# Patient Record
Sex: Female | Born: 1945 | Race: White | Hispanic: No | Smoking: Never smoker
Health system: Southern US, Community
[De-identification: ages and names within clinical notes are randomized; demographics above are authoritative.]

## PROBLEM LIST (undated history)

## (undated) DIAGNOSIS — I509 Heart failure, unspecified: Secondary | ICD-10-CM

## (undated) DIAGNOSIS — E785 Hyperlipidemia, unspecified: Secondary | ICD-10-CM

## (undated) DIAGNOSIS — R7303 Prediabetes: Secondary | ICD-10-CM

## (undated) DIAGNOSIS — I350 Nonrheumatic aortic (valve) stenosis: Principal | ICD-10-CM

## (undated) DIAGNOSIS — I4510 Unspecified right bundle-branch block: Secondary | ICD-10-CM

## (undated) DIAGNOSIS — I2510 Atherosclerotic heart disease of native coronary artery without angina pectoris: Secondary | ICD-10-CM

## (undated) DIAGNOSIS — K219 Gastro-esophageal reflux disease without esophagitis: Secondary | ICD-10-CM

## (undated) DIAGNOSIS — E669 Obesity, unspecified: Secondary | ICD-10-CM

## (undated) HISTORY — PX: FEMUR FRACTURE SURGERY: SHX633

## (undated) HISTORY — PX: CARDIAC CATHETERIZATION: SHX172

## (undated) HISTORY — PX: CARPAL TUNNEL RELEASE: SHX101

## (undated) HISTORY — PX: REPLACEMENT TOTAL KNEE BILATERAL: SUR1225

## (undated) HISTORY — PX: SHOULDER SURGERY: SHX246

---

## 1999-10-03 ENCOUNTER — Encounter: Admission: RE | Admit: 1999-10-03 | Discharge: 1999-10-03 | Payer: Self-pay | Admitting: Family Medicine

## 1999-10-03 ENCOUNTER — Encounter: Payer: Self-pay | Admitting: Family Medicine

## 1999-12-04 ENCOUNTER — Encounter: Payer: Self-pay | Admitting: Family Medicine

## 1999-12-04 ENCOUNTER — Encounter: Admission: RE | Admit: 1999-12-04 | Discharge: 1999-12-04 | Payer: Self-pay | Admitting: Family Medicine

## 2000-10-05 ENCOUNTER — Encounter: Payer: Self-pay | Admitting: Family Medicine

## 2000-10-05 ENCOUNTER — Encounter: Admission: RE | Admit: 2000-10-05 | Discharge: 2000-10-05 | Payer: Self-pay | Admitting: Family Medicine

## 2001-02-11 ENCOUNTER — Encounter: Payer: Self-pay | Admitting: Orthopedic Surgery

## 2001-02-19 ENCOUNTER — Inpatient Hospital Stay (HOSPITAL_COMMUNITY): Admission: RE | Admit: 2001-02-19 | Discharge: 2001-02-23 | Payer: Self-pay | Admitting: Orthopedic Surgery

## 2001-04-16 ENCOUNTER — Encounter: Admission: RE | Admit: 2001-04-16 | Discharge: 2001-04-16 | Payer: Self-pay | Admitting: Family Medicine

## 2001-04-16 ENCOUNTER — Encounter: Payer: Self-pay | Admitting: Family Medicine

## 2001-05-19 ENCOUNTER — Other Ambulatory Visit: Admission: RE | Admit: 2001-05-19 | Discharge: 2001-05-19 | Payer: Self-pay | Admitting: Obstetrics & Gynecology

## 2001-05-19 ENCOUNTER — Encounter (INDEPENDENT_AMBULATORY_CARE_PROVIDER_SITE_OTHER): Payer: Self-pay | Admitting: Specialist

## 2001-07-29 ENCOUNTER — Inpatient Hospital Stay (HOSPITAL_COMMUNITY): Admission: EM | Admit: 2001-07-29 | Discharge: 2001-07-30 | Payer: Self-pay | Admitting: Orthopedic Surgery

## 2001-10-20 ENCOUNTER — Ambulatory Visit (HOSPITAL_COMMUNITY): Admission: RE | Admit: 2001-10-20 | Discharge: 2001-10-20 | Payer: Self-pay | Admitting: Orthopedic Surgery

## 2002-03-02 ENCOUNTER — Ambulatory Visit (HOSPITAL_BASED_OUTPATIENT_CLINIC_OR_DEPARTMENT_OTHER): Admission: RE | Admit: 2002-03-02 | Discharge: 2002-03-02 | Payer: Self-pay | Admitting: Orthopedic Surgery

## 2003-02-10 ENCOUNTER — Encounter: Payer: Self-pay | Admitting: Specialist

## 2003-02-10 ENCOUNTER — Encounter: Admission: RE | Admit: 2003-02-10 | Discharge: 2003-02-10 | Payer: Self-pay | Admitting: Specialist

## 2005-06-20 ENCOUNTER — Inpatient Hospital Stay (HOSPITAL_COMMUNITY): Admission: RE | Admit: 2005-06-20 | Discharge: 2005-06-24 | Payer: Self-pay | Admitting: Orthopedic Surgery

## 2005-09-05 ENCOUNTER — Other Ambulatory Visit: Admission: RE | Admit: 2005-09-05 | Discharge: 2005-09-05 | Payer: Self-pay | Admitting: Family Medicine

## 2008-07-21 ENCOUNTER — Other Ambulatory Visit: Admission: RE | Admit: 2008-07-21 | Discharge: 2008-07-21 | Payer: Self-pay | Admitting: Family Medicine

## 2009-10-21 ENCOUNTER — Inpatient Hospital Stay (HOSPITAL_COMMUNITY): Admission: EM | Admit: 2009-10-21 | Discharge: 2009-10-24 | Payer: Self-pay | Admitting: Emergency Medicine

## 2011-02-18 LAB — URINALYSIS, ROUTINE W REFLEX MICROSCOPIC
Bilirubin Urine: NEGATIVE
Glucose, UA: NEGATIVE mg/dL
Hgb urine dipstick: NEGATIVE
Ketones, ur: NEGATIVE mg/dL
Nitrite: NEGATIVE
Protein, ur: NEGATIVE mg/dL
Specific Gravity, Urine: 1.016 (ref 1.005–1.030)
Urobilinogen, UA: 0.2 mg/dL (ref 0.0–1.0)
pH: 7.5 (ref 5.0–8.0)

## 2011-02-18 LAB — BASIC METABOLIC PANEL
BUN: 11 mg/dL (ref 6–23)
BUN: 12 mg/dL (ref 6–23)
CO2: 25 mEq/L (ref 19–32)
CO2: 25 mEq/L (ref 19–32)
Calcium: 7.5 mg/dL — ABNORMAL LOW (ref 8.4–10.5)
Calcium: 7.8 mg/dL — ABNORMAL LOW (ref 8.4–10.5)
Calcium: 8.1 mg/dL — ABNORMAL LOW (ref 8.4–10.5)
Chloride: 101 mEq/L (ref 96–112)
Creatinine, Ser: 0.52 mg/dL (ref 0.4–1.2)
Creatinine, Ser: 0.62 mg/dL (ref 0.4–1.2)
GFR calc Af Amer: >60 mL/min (ref >60)
GFR calc Af Amer: >60 mL/min (ref >60)
GFR calc non Af Amer: >60 mL/min (ref >60)
GFR calc non Af Amer: >60 mL/min (ref >60)
Glucose, Bld: 122 mg/dL — ABNORMAL HIGH (ref 70–99)
Glucose, Bld: 141 mg/dL — ABNORMAL HIGH (ref 70–99)
Potassium: 4 mEq/L (ref 3.5–5.1)
Potassium: 4 mEq/L (ref 3.5–5.1)
Sodium: 135 mEq/L (ref 135–145)
Sodium: 136 mEq/L (ref 135–145)

## 2011-02-18 LAB — CBC
HCT: 28.6 % — ABNORMAL LOW (ref 36.0–46.0)
HCT: 30 % — ABNORMAL LOW (ref 36.0–46.0)
HCT: 40.1 % (ref 36.0–46.0)
Hemoglobin: 10.2 g/dL — ABNORMAL LOW (ref 12.0–15.0)
Hemoglobin: 11.3 g/dL — ABNORMAL LOW (ref 12.0–15.0)
Hemoglobin: 13.6 g/dL (ref 12.0–15.0)
Hemoglobin: 9.8 g/dL — ABNORMAL LOW (ref 12.0–15.0)
MCHC: 33.9 g/dL (ref 30.0–36.0)
MCHC: 34.2 g/dL (ref 30.0–36.0)
MCV: 93.6 fL (ref 78.0–100.0)
MCV: 93.6 fL (ref 78.0–100.0)
Platelets: 249 10*3/uL (ref 150–400)
Platelets: 284 10*3/uL (ref 150–400)
RBC: 3.05 MIL/uL — ABNORMAL LOW (ref 3.87–5.11)
RBC: 3.61 MIL/uL — ABNORMAL LOW (ref 3.87–5.11)
RBC: 4.28 MIL/uL (ref 3.87–5.11)
RDW: 14.3 % (ref 11.5–15.5)
RDW: 14.3 % (ref 11.5–15.5)
WBC: 14.3 K/uL — ABNORMAL HIGH (ref 4.0–10.5)
WBC: 9.1 10*3/uL (ref 4.0–10.5)

## 2011-02-18 LAB — DIFFERENTIAL
Basophils Absolute: 0 10*3/uL (ref 0.0–0.1)
Basophils Relative: 0 % (ref 0–1)
Eosinophils Absolute: 0 K/uL (ref 0.0–0.7)
Eosinophils Relative: 0 % (ref 0–5)
Lymphocytes Relative: 11 % — ABNORMAL LOW (ref 12–46)
Lymphs Abs: 1.5 10*3/uL (ref 0.7–4.0)
Monocytes Absolute: 0.8 10*3/uL (ref 0.1–1.0)
Monocytes Relative: 6 % (ref 3–12)
Neutro Abs: 12 10*3/uL — ABNORMAL HIGH (ref 1.7–7.7)
Neutrophils Relative %: 84 % — ABNORMAL HIGH (ref 43–77)

## 2011-02-18 LAB — BASIC METABOLIC PANEL WITH GFR
BUN: 14 mg/dL (ref 6–23)
CO2: 25 meq/L (ref 19–32)
Calcium: 8.7 mg/dL (ref 8.4–10.5)
Chloride: 101 meq/L (ref 96–112)
Creatinine, Ser: 0.44 mg/dL (ref 0.4–1.2)
GFR calc non Af Amer: >60 mL/min (ref >60)
Potassium: 3.9 meq/L (ref 3.5–5.1)

## 2011-02-18 LAB — TYPE AND SCREEN
ABO/RH(D): B POS
Antibody Screen: NEGATIVE

## 2011-02-18 LAB — URINE CULTURE
Colony Count: NO GROWTH
Culture: NO GROWTH

## 2011-02-18 LAB — ABO/RH: ABO/RH(D): B POS

## 2011-02-18 LAB — PROTIME-INR
INR: 0.99 (ref 0.00–1.49)
INR: 1.14 (ref 0.00–1.49)
Prothrombin Time: 13 seconds (ref 11.6–15.2)
Prothrombin Time: 14.5 seconds (ref 11.6–15.2)

## 2011-02-18 LAB — APTT: aPTT: 28 s (ref 24–37)

## 2011-04-04 NOTE — Discharge Summary (Signed)
[facility]  Patient:    Amy [name], Amy [name]                     MRN: [id number] Adm. Date:  [id number] Disc. Date: [id number] Attending:  Marlowe Kays Page Dictator:   Della Goo, P.A. CC:         Dyanne Carrel, M.D.                           Discharge Summary  ADMISSION DIAGNOSES: 1. 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 2. Gastroesophageal reflux disease. 3. Inhalant allergies. 4. Hypercholesterolemia.  DISCHARGE DIAGNOSES: 1. 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 2. Gastroesophageal reflux disease. 3. Inhalant allergies. 4. Hypercholesterolemia. 5. Posthemorrhagic anemia without blood transfusion.  PROCEDURES:  The patient underwent right total knee arthroplasty performed by Dr. Simonne Come, assisted by Dr. Darrelyn Hillock, under spinal anesthesia.  CONSULTATIONS:  None.  HISTORY OF PRESENT ILLNESS:  Amy [name] is a 65 year old white female followed by Dr. Simonne Come for chronic right knee pain.  X-rays have revealed advanced arthritic changes with essentially no medial compartment joint space due to bone-on-bone deformity.  She has also had significant dysfunction secondary to the right knee pain.  Exam shows a severe varus deformity noted as well.  Due to these significant findings it was felt she would require surgical intervention.  After preoperative surgical clearance by Dr. Manus Gunning, the patient was scheduled to undergo the above-stated procedure.  HOSPITAL COURSE:  The patient tolerated the procedure without difficulties, under spinal anesthesia.  Once the spinal anesthesia resolved, neurovascular motor function was noted to be intact.  The patient did not utilize a CPM machine during the hospital stay and, therefore, was followed by physical therapy for active range of motion.  She also participated with physical therapy for ambulation and gait training as well as strengthening exercises following the  total knee replacement protocol and was allowed weightbearing as tolerated on the operative extremity.  She progressed quite well during the hospital stay, ambulating as far as 230 feet with a standard walker.  Range of motion prior to discharge was noted to be -10 to 53 degrees.  Postoperatively the patient was placed on Coumadin for DVT and PE prophylaxis.  Adjustments in her Coumadin dose were made according to daily protimes by the pharmacy at [facility].  The patients Hemovac drain was discontinued on the first postoperative day.  Dressing changes were done daily thereafter, and her wound was found to be healing well.  The patient was placed on morphine PCA postoperatively and developed nausea and vomiting.  This was treated with antiemetics, and resolved after her medications were changed to Dilaudid.  The patient is felt to be sensitive to morphine, with a nausea and vomiting reaction.  Occupational therapy assisted the patient with ADLs, and she tolerated this well.  Her hemoglobin dropped on the first postoperative day to 9.0, followed by a further drop to 8.5, and then stabilizing at 8.6. Unfortunately, she had another hemoglobin value of 8.2 prior to her discharge but did not present with any signs or symptoms for anemia and, therefore, was not transfused.  The patient was felt to be stable for discharge to her home on February 23, 2001.  LABORATORY DATA:  Hemoglobin and hematocrit dropped postoperatively, as stated above.  The patient was noted to be anemic on admission at 9.6, with hematocrit 27.9.  Coagulation studies on admission were within normal  limits and showed signs of elevation while on Coumadin during the hospital stay. Chemistry studies on admission were normal with the exception of albumin 3.3. Urinalysis on admission was negative for urinary tract infection.  Chest x-ray on admission showed negative chest for active disease. Preoperative right knee x-rays  showed marked degenerative joint disease of the right knee.  EKG on admission showed normal sinus rhythm with short PR, otherwise normal EKG, with no old tracings for comparison.  Dr. Patty Sermons confirmed the EKG.  PLAN:  The patient will be discharged to her home.  Arrangements have been made for her to have home health physical therapy and Coumadin management by [facility].  She will follow up with Dr. Simonne Come two weeks from the date of her surgery.  The patient will continue on a regular diet at home. Dressing changes will be done daily, and supplies were given for her to do so.  DISCHARGE MEDICATIONS: 1. Percocet #30, 1-2 every four to six hours as needed for pain, no refill. 2. Robaxin 500 mg #30, 1 every eight hours as needed for spasm, no refill. 3. Trinsicon #60, 1 p.o. b.i.d. 4. Coumadin per pharmacy.  DISCHARGE INSTRUCTIONS:  She will continue with range of motion and strengthening exercises and ambulation weightbearing as tolerated.  She will utilize a walker for ambulation.  The patient has been advised to call the office if there are questions or concerns prior to her return office visit. DD:  02/23/01 TD:  02/23/01 Job: [id number]

## 2011-04-04 NOTE — H&P (Signed)
Amy [name], Amy [name]                ACCOUNT NO.:  [id number]   MEDICAL RECORD NO.:  [id number]           PATIENT TYPE:   LOCATION:                                 FACILITY:   PHYSICIAN:  Marlowe Kays, M.D.  DATE OF BIRTH:  [birth year]   DATE OF ADMISSION:  06/20/2005  DATE OF DISCHARGE:                                HISTORY & PHYSICAL   PREOPERATIVE HISTORY AND PHYSICAL   CHIEF COMPLAINT:  Pain in my left knee.Marland Kitchen   PRESENT ILLNESS:  This is a 65 year old white female seen for continuing  problems concerning her left knee which she injured 3 months ago. She  slipped in the grass, fell and landed on her knee. She has now pain with  ambulation, popping and giving out. X-rays have shown progressive  subluxation of the femur on the tibia, doubling the extent in interval since  2005 when she was previously seen. She also had degenerative changes as  well. She is a very active lady and this is markedly interfering with her  day-to-day activities. She has had several other surgeries including lumbar  laminectomy which she has recovered from very nicely, but now this is  markedly interfering with her day-to-day activities. After much discussion  including the risks and benefits of surgery, it was decided that she would  benefit with left total knee replacement arthroplasty and scheduled for  same.   This patient's family physician is Dr. Blair Heys.   ALLERGIES:  She is allergic to MORPHINE and CODEINE, however, she can use  Dilaudid for PCA pump and she can use Vicodin and Percocet for p.o.  analgesics.   CURRENT MEDICATIONS:  1.  Lovastatin 40 mg daily.  2.  Ranitidine 300 mg b.i.d.  3.  Gabapentin 300 mg 3 times a day.  4.  Fosamax 70 mg weekly.   PAST MEDICAL HISTORY:  1.  She has a hiatal hernia.  2.  Nonsymptomatic diverticulitis.  3.  She also has urine frequency.   PAST SURGERIES:  1.  Benign moles removed in 1973.  2.  Left knee surgery in 2002.  3.  Repair of  the rotator cuff in 2002.  4.  Wrist wrist with carpal tunnel release bilaterally, one in 2002 and one      in 2003.   FAMILY HISTORY:  Positive for heart disease in her mother and brother and  diabetes in her brother.   SOCIAL HISTORY:  The patient is married, she is a housewife. She has 1  child. No intake of alcohol or tobacco products. Her husband will be  caregiver after surgery.   REVIEW OF SYSTEMS:  CNS: No seizure disorder, paralysis, numbness, double  vision. RESPIRATORY: No productive cough, no hemoptysis, no shortness of  breath. CARDIOVASCULAR: No chest pain, no angina or orthopnea.  GASTROINTESTINAL: No nausea, vomiting, melena, or bloody stools.  GENITOURINARY: No discharge, dysuria, hematuria. MUSCULOSKELETAL: Primarily  in the present illness.   PHYSICAL EXAMINATION:  GENERAL:  She is an alert, cooperative, friendly,  somewhat obese 65 year old white female.  VITAL SIGNS:  Blood pressure 124/78, pulse 72,  respirations are 12.  HEENT:  Normocephalic. PERRLA. EOM intact. Oropharynx is clear.  CHEST:  Clear to auscultation, no rhonchi, no rales.  HEART:  Regular rate and rhythm, no murmurs are heard.  ABDOMEN:  Soft, nontender, liver and spleen not felt.  GENITALIA, RECTAL, PELVIC, BREAST:  Not done, not pertinent to present  illness.  EXTREMITIES:  Painful range of motion of the left knee, slight valgus is  seen as well.   ADMITTING DIAGNOSES:  1.  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  2.  Reflux.   PLAN:  The patient will undergo a left total knee replacement arthroplasty.  We plan for her to have home health after her regular hospitalization.   NOTE:  This history and physical was performed in our office on June 18, 2005.      [name]   DLU/MEDQ  D:  06/19/2005  T:  06/19/2005  Job:  [id number]   cc:   Bryan Lemma. Manus Gunning, M.D.  [address] [place]  [place] [address]  Fax: [phone]

## 2011-04-04 NOTE — Op Note (Signed)
[facility]  Patient:    EDIT, RICCIARDELLI Visit Number: [id number] MRN: [id number]          Service Type: DSU Location: [facility] Attending Physician:  Marlowe Kays Page Dictated by:   Illene Labrador. Aplington, M.D. Proc. Date: 03/02/02 Admit Date:  03/02/2002                             Operative Report  PREOPERATIVE DIAGNOSIS:  Right carpal tunnel syndrome, status post left carpal tunnel release.  POSTOPERATIVE DIAGNOSIS:  Right carpal tunnel syndrome, status post left carpal tunnel release.  PROCEDURE:  Decompression of the median nerve, right wrist and hand.  SURGEON:  Illene Labrador. Aplington, M.D.  ASSISTANT:  Nurse.  ANESTHESIA:  IV regional.  PATHOLOGY AND JUSTIFICATION FOR PROCEDURE:  She has been diagnosed with nerve conduction studies of bilateral carpal tunnel syndrome with excellent relief on the left and because of progressive symptomatology on the right, she is here today for release on this side as well.  See operative description below for additional details of pathology.  DESCRIPTION OF PROCEDURE:  Satisfactory IV regional anesthesia.  Duraprep from midforearm to fingertips, was draped in a sterile field.  With a marking pen, I marked out a curved incision along the base of the thenar eminence crossing obliquely over the flexor crease of the wrist in the distal forearm.  The palmaris longus tendon beneath the median nerve were identified at the wrist. The nerve was protected, and the overlying skin and subcutaneous tissue and fascia were released into the distal palm.  Potential bleeders were coagulated with bipolar cautery.  The vascular arcade was gently dissected out in the mid- and distal palm with small hemostat.  The major areas of compression appeared to be in the midpalm.  Once the decompression had been completed, the wound was irrigated with sterile saline and the skin and subcutaneous tissue only closed with interrupted  4-0 nylon mattress sutures.  Betadine, Adaptic, dry sterile dressing with a volar plaster splint were applied.  The tourniquet was released.  At the time of dictation she was on her way to the recovery room in satisfactory condition with no known complications. Dictated by:   Illene Labrador. Aplington, M.D. Attending Physician:  Joaquin Courts DD:  03/02/02 TD:  03/03/02 Job: [id number]

## 2011-04-04 NOTE — Discharge Summary (Signed)
Amy [name], Amy [name]                ACCOUNT NO.:  [id number]   MEDICAL RECORD NO.:  [id number]          PATIENT TYPE:  INP   LOCATION:  1607                         FACILITY:  [facility]   PHYSICIAN:  Amy [name], M.D.  DATE OF BIRTH:  1946/04/30   DATE OF ADMISSION:  06/20/2005  DATE OF DISCHARGE:  06/24/2005                                 DISCHARGE SUMMARY   ADMITTING DIAGNOSES:  1.  Severe osteoarthritis left knee with ligamentous instability..  2.  Reflux.   DISCHARGE DIAGNOSES:  1.  Severe osteoarthritis left knee with ligamentous instability..  2.  Reflux.  3.  Mild postoperative anemia.   OPERATION:  Osteonics total knee replacement arthroplasty of the left knee  with all three component cemented. Amy [name], M.D. assisted.   BRIEF HISTORY:  This 65 year old white female with progressive problems  concerning pain into her left knee. She had an injury about 3 months ago  which aggravated her condition of osteoarthritis. She has popping and  giving out. She has subluxation of the femur on the tibia seen on x-ray.  She is an active lady and this is markedly interfering with her day-to-day  activities, and after much discussion including the risks and benefits of  surgery the patient was scheduled for the above procedure.   HOSPITAL COURSE:  The patient tolerated the surgical procedure quite well,  was placed in the total knee protocol postoperatively which she did quite  well as she was eager to resume her ADLs that she enjoyed before present  illness. She was placed on Coumadin protocol postoperatively for the  prevention of DVT. Wound remained dry without evidence of any infection. She  was doing quite well and was anxious to go home. Home arrangements were made  for therapy. Neurovascular remained intact to the operative extremity and  there were no medical problems while she was in the hospital.   LABORATORY VALUES IN THE HOSPITAL:  Preoperatively showed a mild  anemia with  RBC of 3.8, hemoglobin was 11.8, hematocrit was 34.9. Final hemoglobin was  9.2 with hematocrit of 26.1. Blood chemistries were normal. Urinalysis was  essentially negative for urinary tract infection. Electrocardiogram showed  normal ECG. Chest x-ray showed no evidence of acute abnormality.   CONDITION UPON DISCHARGE:  Improved, stable.   PLAN:  The patient is discharged to her home in the care of her family.  Return to see Amy [name] 2 weeks after the date of surgery. Use dry  dressing p.r.n.  Discharge medications:  Vicodin for discomfort, Phenergan for nausea  Coumadin for DVT prophylaxis, and she is to use over-the-counter iron.  Continue with home medications and diet. Follow up with Dr. Manus [name] per his  [name].      Amy [name]   DLU/MEDQ  D:  06/30/2005  T:  06/30/2005  Job:  [id number]   cc:   Amy [name], M.D.  [address] [place]  [place] [address]  Fax: [phone]

## 2011-04-04 NOTE — Op Note (Signed)
[facility]  Patient:    Amy [name], Amy [name] Visit Number: [id number] MRN: [id number]          Service Type: Attending:  Illene Labrador. Aplington, M.D. Proc. Date: 07/28/01                             Operative Report  PREOPERATIVE DIAGNOSIS:  Torn rotator cuff right shoulder.  POSTOPERATIVE DIAGNOSIS:  Torn rotator cuff right shoulder.  OPERATION:  Anterior acromionectomy, repair of torn rotator cuff right shoulder.  SURGEON:  Illene Labrador. Aplington, M.D.  ASSISTANT:  Dorie Rank, P.A.-C.  ANESTHESIA:  General.  PATHOLOGY AND JUSTIFICATION FOR PROCEDURE:  She has had a painful right shoulder for perhaps six months.  I performed a total knee replacement which was giving her more problems than the shoulder, but since this has come along well, she has noted progressive pain in the right shoulder which led to an MRI performed on August 28, which showed a large rotator cuff tear.  This is confirmed at surgery with a large vertical tear, extending roughly 5 cm up beneath the anterior acromion which was quite hypertrophic with a good bit of separation of two halves of the rotator cuff.  DESCRIPTION OF PROCEDURE:  Prophylactic antibiotics, satisfactory general anesthesia, beach chair position on the Schlein frame, and the right shoulder girdle was prepped with DuraPrep and draped in a sterile field.  Vertical incision from roughly the [facility] joint down to the greater tuberosity.  The fascia over the anterior acromion was opened along with the skin incision and retracted anteriorly and posteriorly.  The large acromion was identified as well as the [facility] joint with the [place] needle.  I made my initial anterior acromionectomy with a microsaw, protecting the underneath rotator cuff with the Cobb elevator.  She had a significant impingement problem and also because of the extent of the tear, ended up having to take off a good bit more of the anterior acromion, beveling it  inferiorly.  When I had adequate exposure to view the tear, it was somewhat layered with two halves to it which were not very movable.  I started at the apex of the tear and began placing interrupted #1 Ethibond sutures, working laterally.  At the greater tuberosity, because of a lack of mobility of the two pieces, I elected to go ahead and use two Mitek anchors with the two threads on each Mitek anchor going up to the individual halves anteriorly and posteriorly and cinching them down both laterally and towards the center portion of the greater tuberosity.  I then tied the two pieces together over top of the greater tuberosity which also helped to stabilize the cuff laterally and bring it together better.  I then placed some additional side-by-side sutures to supplement this.  This seemed to give a nice stable repair.  The wound was then irrigated with sterile saline.  The soft tissue was infiltrated with 0.5% plain Marcaine.  The deltoid muscle was reapproximated in two layers with interrupted #1 Vicryl.  Because of the extent of the ulnar removed from the anterior acromion and also the fact that she did not have a very healthy fascia posteriorly, I reattached the deltoid using two horizontal mattress sutures with #1 Vicryl through a total of four drill holes which have a nice stable reattachment.  I then supplemented this with some #1 Vicryl through the soft tissue.  The subcutaneous tissue was closed with a combination  of #1 Vicryl deep, 2-0 Vicryl superficially with Steri-Strips on the skin.  Betadine and Adaptic dry sterile dressing and shoulder immobilizer were applied.  She tolerated the procedure well and was taken to the recovery room in satisfactory condition with no known complications. Attending:  Illene Labrador. Aplington, M.D. DD:  07/28/01 TD:  07/28/01 Job: [id number]

## 2011-04-04 NOTE — Op Note (Signed)
Amy [name], Amy [name]                ACCOUNT NO.:  [id number]   MEDICAL RECORD NO.:  [id number]          PATIENT TYPE:  INP   LOCATION:  1607                         FACILITY:  [facility]   PHYSICIAN:  Marlowe Kays, M.D.  DATE OF BIRTH:  [birth year]   DATE OF PROCEDURE:  06/20/2005  DATE OF DISCHARGE:                                 OPERATIVE REPORT   PREOPERATIVE DIAGNOSIS:  Osteoarthritis, left knee.   POSTOPERATIVE DIAGNOSIS:  Osteoarthritis, left knee.   OPERATION:  Osteonics total knee replacement, left.   SURGEON:  Marlowe Kays, M.D.   ASSISTANT:  Georges Lynch. Darrelyn Hillock, M.D.   ANESTHESIA:  General.   JUSTIFICATION FOR PROCEDURE:  She has had successful total knee replacement  on the right.  In the left knee, she had severe tricompartmental arthritis  with medial subluxation of the femur on the tibia.   DESCRIPTION OF PROCEDURE:  Satisfactory general anesthesia. Prophylactic  antibiotics, Foley catheter inserted.  I used a bone bur underneath the left  hip, lateral hip stabilizer and Sure Foot and pneumatic tourniquet.  The  left leg was prepped from tourniquet to ankle with DuraPrep and draped in a  sterile field.  Ioban employed.  The left leg was esmarked out sterilely  after draping it in a sterile field.  Vertical midline incision down to the  patellar mechanism with median parapatellar incision to open the joint.  Patellar mechanism was freed up.  The patella everted and the knee flexed.  Osteophytes were removed from around the femur and the patella and the  anterior portions of menisci and a portion of the ACL and PCL were resected.  I made a 5/16 inch drill hole in the distal femur followed by the canal  finder and the axis aligner set for 5 degrees valgus cut to the left knee.  A 10 mm cut was made on the distal femur.  Her knee was quite tight and for  this reason I went on to the tibial preparation, first making a leveling cut  on the proximal tibia and incising  the proximal tibia tentatively with a #5.  Initially, intramedullary drill hole was made followed by the step-cut  drill, canal finder and the internal rod with the external guide for cutting  a 4 mm medial base wedge off the proximal tibia.  This 90 degree cut was  made and I then returned to the femur where I was then able get the sizing  guide in place.  She had very narrow medial lateral femur but high anterior  and posterior.  Using the sizing jig, we sized her at a 7 but we just felt  that this was a misreading for her anatomy. She had a five in the right leg.  I sized her femur with a 5 and found that this did appear to be the  appropriate size.  Consequently, I placed the size 5 cutting jig on the  distal femur and made my initial cuts based on it, which seemed to be  appropriate.  We took off neither too much or too little off the anterior  femur.  After these cuts were made, I then placed the jig for making the  patellar groove and cutting for the intercondylar area for the tibial post.  She used a microsaw initially to help remove bone before making these final  cuts.  I then used the laminar spreader to remove bone from the posterior  femoral condyles as well as remnants of the menisci and the PCL.  I then  went through a trial reduction with a 5 femur and as it turned out a size 3  tibia was more appropriate.  A 10-mm spacer easily fit so we did not have to  do more bone resection.  I then lined up the tibial tray with the external  rods, splitting bimalleolar distance and made we scribe lines on the tibia  at that point.  I then went to the patella.  The patella was too small on  the right to use even the 26 mm patella but we had barely enough room in the  anterior-posterior direction but there was plenty of room medially and  laterally.  I elected to go ahead and use the 26 mm patella, and I used the  of 10 mm recessed cutting jig, first followed by the guide for making  three  fixation holes.  I then placed a trial 26 patella and trimmed up excess bone  from around the perimeter.  I then returned to the tibia, and I placed a #3  tray on the tibia based on the previous scribe marks which were stabilized  with three pins.  I then used the tripod apparatus to ream up to a 5  cemented.  I then water picked the knee while the methylmethacrylate was  being mixed.  After drying the tibia, we inserted the tibial component,  following placement glue, impacting it and then removing excess  methylmethacrylate.  I likewise glued in the femoral component and held the  knee in extension with a 10 mm spacer while we glued in the patellar  component using a patellar holder.  When the methylmethacrylate had  hardened, I removed particles of methylmethacrylate from around the  components, particularly checking posteriorly on the femur.  I then went  through another series of trial reductions and found that a 15 posterior  stabilized was a little tight and a 12 seemed to be ideal which is what we  ultimately used. After irrigating the tray well, we placed a final 12 mm  posterior stabilized insert reducing the knee and found it to be nicely  stable.  No lateral release was required.  The Hemovac was placed and the  wound was closed in layers with interrupted #1 Vicryl in the synovium and  capsule distally and two layers in the quadriceps proximally.  Subcutaneous  tissue was closed with a combination of 1-0 and 2-0 Vicryl and staples in  the skin.  Betadine, Adaptic and dry sterile dressing were applied.  Tourniquet was released.  She tolerated the procedure well and was taken to  recovery in satisfactory condition without known complications.       JA/MEDQ  D:  06/20/2005  T:  06/21/2005  Job:  [id number]

## 2011-04-04 NOTE — H&P (Signed)
[facility]  Patient:    Amy [name], Amy [name]                         MRN: [id number] Adm. Date:  02/19/01 Attending:  Fayrene Fearing P. Aplington, M.D. Dictator:   [name]. Shela Nevin, P.A. CC:         Dyanne Carrel, M.D., [facility] [place], [address] [place]             [address], [place], [place]  [address]   History and Physical  DATE OF BIRTH:  1946-05-27.  CHIEF COMPLAINT:  "Pain in my right knee."  PRESENT ILLNESS:  This 65 year old white female is seen by Dr. Illene Labrador. Aplington for continuing problems concerning her right knee.  She had trauma to this knee with multiple falls over the years and has been noticing increasing problems with ambulation.  She is a very stoic lady and she is very active and has continued as much activity as possible despite this knee.  X-rays have shown advanced arthritis changes with essentially no medial compartment joint space due to bone-on-bone deformity.  There is also noted to be an osteo-density in the femoral condyle.  She has severe varus noted as well.  Due to these positive findings and the fact that this patient is finding her general lifestyle deteriorating, it is felt she would benefit from surgical intervention with a total knee replacement arthroplasty to the right knee.  She plans to go home with therapy after her regular hospitalization. She has been cleared preoperatively by Dr. Dyanne Carrel.  The patient has donated two units of autologous blood for this procedure.  PAST MEDICAL HISTORY:  The patient has had only minor surgeries with a mole removed from her breast area (benign) and toe surgery.  She has mild hypercholesterolemia, GERD, seasonal allergies and she is allergic to mold.  CURRENT MEDICATIONS: 1. Prempro 0.625/2.5 mg q.d. 2. Ranitidine 300 mg one b.i.d. p.r.n. 3. Guaifenesin 600 mg b.i.d. p.r.n. 4. Aleve and Tylenol, one daily as needed (will stop five days  prior to    surgery). 5. She also takes vitamin K as a supplement and she will stop this today, on    February 11, 2001. 6. I told her not to take her other herbal supplements. 7. She may take vitamin E and C before the surgery.  ALLERGIES:  The patient is allergic to EPINEPHRINE, which causes irregular heart beat, and some NSAIDS.  DARVOCET-N causes nausea.  She is also sensitive to ANESTHESIA secondary to nausea.  SOCIAL HISTORY:  The patient is married and neither smokes nor drinks.  FAMILY HISTORY:  Positive for hypercholesterolemia, arthritis and osteoporosis.  REVIEW OF SYSTEMS:  CNS:  No seizure disorder, paralysis, numbness or double vision.  She occasionally has blurred vision but wears glasses. GASTROINTESTINAL:  No nausea, vomiting, melena or bloody stools.  RESPIRATORY: No productive cough.  No hemoptysis.  No shortness of breath.  CARDIOVASCULAR: No chest pain.  No angina.  No orthopnea.  GENITOURINARY:  No discharge, dysuria or hematuria.  MUSCULOSKELETAL:  Primarily in present illness.  PHYSICAL EXAMINATION:  GENERAL:  Alert, cooperative, friendly 65 year old white female whose vital signs are:  Blood pressure 160/82; pulse 80; respirations are 12.  HEENT:  Normocephalic.  PERRLA.  Wears glasses.  Oropharynx is clear.  CHEST:  Clear to auscultation.  No rhonchi nor rales.  HEART:  Regular rate and rhythm.  No murmurs  are heard.  ABDOMEN:  Soft and nontender.  Liver and spleen not felt.  GENITALIA/PELVIC:  Not done, not pertinent to present illness.  RECTAL:  Not done, not pertinent to present illness.  BREASTS:  Not done, not pertinent to present illness.  EXTREMITIES:  The patient has pain and crepitus and ______ and pain with range of motion.  The knee is full and warm but no effusion.  Neurovascular is intact to the right lower extremity.  ADMITTING DIAGNOSES: 1. 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 2. Gastroesophageal reflux disease. 3.  Inhalant allergies.  PLAN:  Patient will be admitted for total knee replacement arthroplasty of the right knee.  She will go home after her regular surgical procedure.  She has donated two units of autologous blood for this procedure and it is of note, she has some mild anemia prior to admission.  She has been cleared by her physician, Dr. Blair Heys for this surgery.  We will certainly ask Dr. Manus Gunning to follow along with [place] during this patients hospitalization, should any need medically arise. DD:  02/11/01 TD:  02/12/01 Job: [id number]

## 2011-04-04 NOTE — H&P (Signed)
[facility]  Patient:    Amy [name], Amy [name] Visit Number: [id number] MRN: [id number]          Service Type: Attending:  Illene Labrador. Aplington, M.D. Dictated by:   Sammuel Cooper. Mahar, P.A. Adm. Date:  07/28/01                           History and Physical  DATE OF BIRTH:  07/30/1946  PATIENT IDENTIFICATION:  She is a 65 year old female.  CHIEF COMPLAINT:  Right shoulder pain.  HISTORY OF PRESENT ILLNESS:  Patient has had a significant right shoulder pain since April of this year, however, she had a total knee replacement done at that time so is kind of a secondary concern for her.  She does not really remember any specific injury to onset, it was just accumulating over time and progressively getting worse.  She is a right-hand-dominant female.  It has currently progressed to the point that she is having significant pain and giving-way of her right shoulder three to four times a week; she is also having significant pain at night causing her to have difficulty sleeping.  She does have some weakness secondary to the pain.  She has failed conservative management at this point and is to the point that it is interfering with her activities of daily living and quality of life.  ALLERGIES:  Patient reports EPINEPHRINE causes an irregular heart beat, DARVOCET causes nausea and vomiting, ANESTHESIA causes nausea and vomiting and MORPHINE causes nausea and vomiting.  MEDICATIONS: 1. Evista 60 mg one q.d.  Patient is going to stop that on Sunday. 2. Ranitidine 300 mg one b.i.d. as needed. 3. Guaifenesin 600 mg one b.i.d. as needed. 4. Tylenol p.r.n. 5. Vitamin K; patient is going to stop prior to surgery. 6. Vitamin E; the patient is to stop prior to surgery. 7. Vitamin C. 8. Aspirin 81 mg one b.i.d., which she is stopping as of today prior to    surgery.  PAST MEDICAL HISTORY:  Significant for hypercholesterolemia, gastroesophageal reflux disease and seasonal  allergies.  PAST SURGICAL HISTORY:  Significant for a total knee on the right side done in April of 2002, a mole removed from her breast which was benign, toe surgery.  SOCIAL HISTORY:  Patient denies any tobacco use.  Denies alcohol use.  She is married and lives locally with her husband.  FAMILY HISTORY:  Mother is deceased at age 71 secondary to an MI.  Father is alive at age 49 and is healthy with only osteoporosis.  She does have a brother that died at age 51 secondary to an MI with a history of diabetes.  REVIEW OF SYSTEMS:  Patient denies any fevers, chills, night sweats, bleeding tendencies; denies blurred vision, double vision, headaches, paralysis or seizures; denies any chest pain, orthopnea, claudication or angina; denies any shortness of breath, productive cough or hemoptysis; denies nausea, vomiting, constipation, diarrhea, melena or blood in her stool and denies any hematuria, dysuria.  MUSCULOSKELETAL:  Denies any paresthesias, numbness.  PHYSICAL EXAMINATION:  VITAL SIGNS:  Blood pressure is 138/86.  Respirations 16 and unlabored.  Pulse is 84 and regular.  GENERAL APPEARANCE:  Patient is a 65 year old white female who is alert and oriented, in no acute distress.  She is pleasant and cooperative to exam.  She is well-nourished, well-groomed and appears her stated age.  HEENT:  Head is normocephalic, atraumatic.  Extraocular movements intact. Pupils are equal, round  and reactive to light.  Nares are patent bilaterally. Throat is clear without any erythema or exudate.  NECK:  Soft and supple to palpation.  No thyromegaly noted.  No bruits appreciated.  No lymphadenopathy.  CHEST:  Clear to auscultation bilaterally.  No rales, rhonchi, wheezes, stridor or friction rub.  BREASTS:  Not pertinent, not performed.  HEART:  Regular S1, S3, regular rate and rhythm.  No murmurs, gallops or rubs appreciated.  ABDOMEN:  Soft and supple to palpation, positive bowel  sounds throughout, nontender, nondistended.  No organomegaly noted.  GU:  Not pertinent and not performed.  EXTREMITIES:  Sensation is intact to bilateral upper extremities.  Range of motion is painful at the extremes to her right shoulder.  Bilateral upper extremities are neurovascularly intact.  SKIN:  Intact without any rashes or lesions.  X-RAY FINDINGS:  MRI showed a right rotator cuff tear.  IMPRESSION: 1. Right rotator cuff tear. 2. Hypercholesterolemia. 3. Gastroesophageal reflux disease. 4. Seasonal allergies.  PLAN:  Plan is to admit to [facility] on July 28, 2001 for a right rotator cuff repair by Dr. Illene Labrador. Aplington.  Patients primary care physician is Dr. Dyanne Carrel of [facility], who has referred her to our clinic for evaluation of her shoulder. Dictated by:   Sammuel Cooper. Mahar, P.A. Attending:  Illene Labrador. Aplington, M.D. DD:  07/22/01 TD:  07/22/01 Job: [id number]

## 2011-04-04 NOTE — Discharge Summary (Signed)
[facility]  Patient:    Amy [name], Amy [name] Visit Number: [id number] MRN: [id number]          Service Type: SUR Location: 4W [id number] 02 Attending Physician:  Marlowe Kays Page Dictated by:   Marcie Bal Velna Ochs, P.A.-C Admit Date:  07/28/2001 Disc. Date: 07/30/01                             Discharge Summary  DISCHARGE DIAGNOSES: 1. Torn rotator cuff, right shoulder. 2. Postoperative nausea and vomiting. 3. Hypercholesterolemia. 4. Gastroesophageal reflux disease. 5. Seasonal allergies.  PROCEDURE:  Anterior acromionectomy and repair of torn rotator cuff right shoulder by Dr. Simonne Come with the assistance of Dorie Rank, P.A.-C on July 28, 2001.  Please see operative summary for further details.  CONSULTATIONS:  None.  LABORATORY DATA AND X-RAY FINDINGS:  Preoperative urinalysis on September 6, was all within normal limits.  Negative nitrite and leukocyte esterase. Comprehensive metabolic panel was all within normal limits with the exception of slightly elevated glucose of 116.  Her sodium and potassium were 138 and 3.9 respectively.  BUN and creatinine 12 and 0.6.  PT and PTT also within normal limits preop.  White count 5.8, H&H 12.5 and 35.6 with a platelet count of 327.  Chest x-ray prior to admission from February 11, 2001, showed the heart, lungs and bony thorax to be within normal limits.  EKG also from February 11, 2001, showed normal sinus rhythm with a short PR interval, otherwise normal EKG.  CHIEF COMPLAINT:  Right shoulder pain.  HISTORY OF PRESENT ILLNESS:  Amy [name] is a 65 year old, right-handed dominant female who presents with complaints of right shoulder pain.  This began in April 2002.  She does not recall any specific injury or cause for her symptoms.  Pain has progressively worsened to the point that her shoulder feels like it has been giving way on her three to four times a week.  She has also had pain at night and  difficulty sleeping.  She has no significant weakness in that shoulder.  She has tried conservative measures including antiinflammatory medications and strengthening exercises without improvement. Because of the effects on her quality of life and interference of her activities of daily living, she elected to undergo surgical intervention. Prior to surgery, preoperative labs were obtained and signed surgical consent. The patient was admitted on September 11, for surgical intervention.  HOSPITAL COURSE:  Following surgical procedure, the patient was taken to the PACU in stable condition.  She was transferred to the orthopedic floor in good condition.  She did fairly well during her hospital stay, but did have some early postoperative nausea.  She worked to control her nausea postop.  On postop day #1, she did have significant nausea as well as vomiting.  This is felt to be probably a combination of her pain medications and anesthesia.  She was treated with antiemetics and still continued with some nausea through the afternoon of postop day #1.  Her pain was well-controlled.  Because of this, she was kept overnight for nausea and vomiting control.  Also noted, he was consulted to assist the patient with ADLs.  On postop day #2, she was feeling much improved and her nausea and vomiting had resolved.  Pain was controlled and she was stable and ready for discharge home.  Of note, she had a very low-grade fever of 100 degrees.  This was treated with Tylenol.  DISPOSITION:  Discharged to home.  DIET:  Low cholesterol diet.  FOLLOWUP:  Follow up with Dr. Simonne Come two weeks out from surgery.  She is to call [phone], for an appointment.  SPECIAL INSTRUCTIONS:  She is to change dressings once daily.  Keep the area clean and dry.  She is encouraged to move her fingers and wrists.  She may move her elbow flexion and extension, but no shoulder movements with sling. No use or lifting of that  arm.  DISCHARGE MEDICATIONS:  1. Percocet 5/325 one or two every four to six hours as needed for pain.  2. Phenergan 25 mg one p.o. q.6h. p.r.n. nausea.  3. Robaxin 500 mg one p.o. q.8h. p.r.n. spasm.  4. Evista 60 mg p.o. q.d.  5. Ranitidine 300 mg p.o. b.i.d.  6. Guaifenesin 600 mg p.o. b.i.d.  7. Tylenol as needed for temperature over 100 degrees.  8. Vitamin K.  9. Vitamin E. 10. Vitamin C. 11. Aspirin 81 mg p.o. b.i.d.  CONDITION ON DISCHARGE:  Good and improved. Dictated by:   Marcie Bal Velna Ochs, P.A.-C Attending Physician:  Joaquin Courts DD:  07/30/01 TD:  07/30/01 Job: [id number]

## 2011-04-04 NOTE — Op Note (Signed)
[place]  Patient:    Amy [name], Amy [name]                     MRN: [id number] Proc. Date: 02/19/01 Adm. Date:  [id number] Attending:  Marlowe Kays Page                           Operative Report  PREOPERATIVE DIAGNOSIS:   Severe osteoarthritis, right knee.  POSTOPERATIVE DIAGNOSIS:  Severe osteoarthritis, right knee.  OPERATION:  Osteonics total knee replacement right with Scorpio cruciate sacrificing model.  SURGEON:  Illene Labrador. Aplington, M.D.  ASSISTANT:  Georges Lynch. Darrelyn Hillock, M.D.  ANESTHESIA:  General.  INDICATIONS:  She had a severe varus deformity with a 15 degree flexion contracture.  This was associated with advanced tricompartmental arthritis.  DESCRIPTION OF PROCEDURE:   Prophylactic antibiotics, appropriate spinal anesthesia, Foley catheter inserted.  Pneumatic tourniquet.  Sure-Foot.  Right leg was prepped with DuraPrep from tourniquet to ankle and draped in a sterile field.  Ioban employed.  Vertical midline incision down to the patella mechanism in knee with parapatellar incision to open the joint.  Osteophytes were removed from around the patella and the femur.  The patients right and mid collateral ligaments were undermined.  Because of the significant tightness of her knee, I elected to go with a posterior cruciate sacrificing model, Scorpio, and the cruciates were removed and the anterior portions of both menisci removed.  With some difficulty with preserving the patellar tendon, I was able to evert the patella and flex the knee.  I made my initial intermedullary drill hole in the distal femur followed by the axis line for a 5 degree cut over the right knee, and I took a 12 mm distal femoral cut because of the severe contracture.  Because her knee was so tight I then went ahead and bypassed the usual continuing procedure on the femur because there was not room to work and I made a leveling cut on the proximal tibia and  then proceeded to make my proximal tibial cut in the usual fashion.  We started with a #5 tray and made my intermedullary drill hole followed by a canal finder and then the intermedullary rod two which I attached the external cutting jig for a 5 degree posterior cut of 4 mm and we lined up the cutting guide using an external line and splitting the bimalleolar distance.  A 5 degree posterior cut was made.  Residual osteophytes were removed from the tibia medially and we then sized the knee at a size 3 for the tibia.  We now had enough working room that I was able to continue cutting the femur.  I placed the cradle apparatus and we sized the distal femoral compartment at a 5.  The scribe lines were placed for placement of the distal femoral cut jig which was placed and anterior and posterior cuts and anterior and posterior chamferings were then made.  We then placed the distal femoral guide for creating both a patellar groove and also cut out for the post for the tray. After these cuts were made, I then used the lens spreader to remove residual from bone and soft tissue of posterior joints bilaterally, and we then went through a trial reduction with femoral component tightly impacted which fit nicely on the femur and a #3 tibial tray.  A 12 mm spacer seemed to be about the appropriate width at  this point.  With the knee in extension, we then looked at the patella.  After I trimmed up osteophytes, I measured the total width of the patella.  It had a maximum of 18 mm which we felt was too small for the usual 10 mm recess cutting prosthesis.  Also, the 26 prosthesis was too large in terms of superior and inferior placement.  Therefore, we went ahead and did a resurfacing type of patella.  I leveled the patella down until it was about 12 mm in thickness.  We did not feel comfortable going any less than this.  A #3 patella was the best fit and the trial guide for making three drill holes was then  placed.  None of the drill holes were too deep.  I went ahead and returned to the tibia where the tripod apparatus for creating the tibial keyhole was placed.  Just prior to that, we attached the trial #3 tibial template which we previously lined up splitting bimalleolar distance once again for proper positioning on the tibia which I stabilized with three pins.  We then reamed up to a 3 cemented.  The knee was then water picked while the methyl methacrylate was being mixed.  We then individually glued in the components, starting first with the tibial component which was tightly impacted with excess methyl methacrylate being removed.  We then moved the femur.  Once again we tightly impacted it and made sure that no glue was in the hole for the post.  The knee was held in extension with a trial 12 mm spacer while we glued in the patella which we held with the patellar holding clamp.  Once the methacrylate hardened, we removed small bits of methacrylate from around the components and went through another trial reduction and found that the 15 mm thickness spacer was the best.  An 18 was too tight. Consequently, we went ahead and after irrigating the wound well with antibiotic solution placed the final PCS #15 mm #3 spacer in place and reduced the knee.  Extensive lateral release was required, and we performed all the lateral release that we could.  Univac was then placed and the wound closed with two layers in the closure medially and the quadriceps with #1 Vicryl and distally with two layers, with one in the synovium and one in the capsule. Deep subcutaneous tissue was closed with #1 Vicryl.  Distally, I was able to close the subcutaneous tissue with 2-0 Vicryl but proximal to the patella she had a large thickness of fatty tissue and there was no substance to hold sutures so I had to use some 2-0 nylon stay sutures which I supplemented with 3-0 nylon and staples and staples in the rest of the  wound as well as some 3-0 nylon distally for additional wound stabilization.  Betadine, Adaptic, dry sterile dressing were applied.  Tourniquet was released after two hours of tourniquet time.  She tolerated the procedure well and was taken to the  recovery room in satisfactory condition with no complications. Estimated blood loss was approximately 100 cc.  No blood replacement.  DD:  02/19/01 TD:  02/21/01 Job: [id number]

## 2011-04-04 NOTE — Op Note (Signed)
[place]  Patient:    Amy [name], Amy [name] Visit Number: [id number] MRN: [id number]          Service Type: DSU Location: [facility] [id number] Attending Physician:  Marlowe Kays Page Dictated by:   Illene Labrador. Aplington, M.D. Proc. Date: 10/20/01 Admit Date:  10/20/2001                             Operative Report  PREOPERATIVE DIAGNOSIS:  Left carpal tunnel syndrome.  POSTOPERATIVE DIAGNOSIS:  Left carpal tunnel syndrome.  OPERATION:  Decompression median nerve left wrist and hand.  SURGEON:  Illene Labrador. Aplington, M.D.  ASSISTANT:  Nurse.  ANESTHESIA:  General.  JUSTIFICATION FOR PROCEDURE:  She had pain and numbness characteristic of carpal tunnel syndrome with nerve conduction studies confirming the diagnosis. At surgery, she had significant compression of the median nerve and discoloration of the median nerve in the carpal canal.  PROCEDURE:  Satisfactory general anesthesia, pneumatic tourniquet, arm was esmarched out, Duraprep from mid forearm to fingertips, which was draped in a sterile field.  With a marking pen, I marked out the surgical incision curved along the base of the thenar eminence, crossing obliquely over the flexor crease of the wrist in the distal forearm.  The palmaris longus tendon was identified and retracted radial-ward.  The median nerve were identified deep to it and then began progressive release of tissues, skin and subcutaneous tissue and fascia.  Potential bleeders were coagulated with bipolar cautery. Dissection was carried out in the distal palm with the mid palm vascular arcade and nerve branches gently dissected out with hemostat.  Once the decompression had been completed, I irrigated the wound with sterile saline and released the tourniquet.  Several minor bleeders were coagulated, but the wound was basically dry on closure.  The skin and subcutaneous tissue were closed with interrupted 4-0 nylon mattress  sutures.  Betadine, Adaptic, dry sterile dressing and volar plaster splint were applied.  She tolerated the procedure well and was taken to the recovery room in satisfactory condition with no known complications. Dictated by:   Illene Labrador. Aplington, M.D. Attending Physician:  Joaquin Courts DD:  10/20/01 TD:  10/20/01 Job: [id number]

## 2014-01-29 ENCOUNTER — Encounter (HOSPITAL_COMMUNITY): Payer: Self-pay | Admitting: Emergency Medicine

## 2014-01-29 ENCOUNTER — Emergency Department (HOSPITAL_COMMUNITY)
Admission: EM | Admit: 2014-01-29 | Discharge: 2014-01-30 | Disposition: A | Discharge status: Home / Self Care | Payer: Medicare HMO | Attending: Emergency Medicine | Admitting: Emergency Medicine

## 2014-01-29 DIAGNOSIS — S0181XA Laceration without foreign body of other part of head, initial encounter: Secondary | ICD-10-CM

## 2014-01-29 DIAGNOSIS — S0180XA Unspecified open wound of other part of head, initial encounter: Secondary | ICD-10-CM | POA: Insufficient documentation

## 2014-01-29 DIAGNOSIS — W19XXXA Unspecified fall, initial encounter: Secondary | ICD-10-CM

## 2014-01-29 DIAGNOSIS — Z79899 Other long term (current) drug therapy: Secondary | ICD-10-CM | POA: Insufficient documentation

## 2014-01-29 DIAGNOSIS — Z8739 Personal history of other diseases of the musculoskeletal system and connective tissue: Secondary | ICD-10-CM | POA: Insufficient documentation

## 2014-01-29 DIAGNOSIS — S0230XA Fracture of orbital floor, unspecified side, initial encounter for closed fracture: Secondary | ICD-10-CM | POA: Insufficient documentation

## 2014-01-29 DIAGNOSIS — Y92009 Unspecified place in unspecified non-institutional (private) residence as the place of occurrence of the external cause: Secondary | ICD-10-CM | POA: Insufficient documentation

## 2014-01-29 DIAGNOSIS — Y939 Activity, unspecified: Secondary | ICD-10-CM | POA: Insufficient documentation

## 2014-01-29 DIAGNOSIS — W010XXA Fall on same level from slipping, tripping and stumbling without subsequent striking against object, initial encounter: Secondary | ICD-10-CM | POA: Insufficient documentation

## 2014-01-29 DIAGNOSIS — H1130 Conjunctival hemorrhage, unspecified eye: Secondary | ICD-10-CM | POA: Insufficient documentation

## 2014-01-29 DIAGNOSIS — H1131 Conjunctival hemorrhage, right eye: Secondary | ICD-10-CM

## 2014-01-29 DIAGNOSIS — E785 Hyperlipidemia, unspecified: Secondary | ICD-10-CM | POA: Insufficient documentation

## 2014-01-29 NOTE — ED Notes (Addendum)
Pt reports she tripped and fell at 2330, hitting the R side of her head above her R eyebrow on the corner of a coffee table. Dried blood present, indentation to head at area of laceration noted. Pt denies being on anticoagulants or LOC or nausea. Pt a&o x4.

## 2014-01-30 ENCOUNTER — Emergency Department (HOSPITAL_COMMUNITY): Payer: Medicare HMO

## 2014-01-30 MED ORDER — TRAMADOL HCL 50 MG PO TABS
50.0000 mg | ORAL_TABLET | Freq: Once | ORAL | Status: AC
  Administered 2014-01-30: 50 mg via ORAL
  Filled 2014-01-30: qty 1

## 2014-01-30 MED ORDER — METHOCARBAMOL 500 MG PO TABS
750.0000 mg | ORAL_TABLET | Freq: Once | ORAL | Status: AC
  Administered 2014-01-30: 750 mg via ORAL
  Filled 2014-01-30: qty 2

## 2014-01-30 MED ORDER — TRAMADOL HCL 50 MG PO TABS
50.0000 mg | ORAL_TABLET | Freq: Four times a day (QID) | ORAL | Status: DC | PRN
Start: 2014-01-30 — End: 2021-10-16

## 2014-01-30 MED ORDER — METHOCARBAMOL 750 MG PO TABS: 750.0000 mg | ORAL_TABLET | Freq: Four times a day (QID) | ORAL | Status: DC

## 2014-01-30 NOTE — Discharge Instructions (Signed)
Suture may be removed in 5 days.  This can be done at urgent care, with your primary care doctor, or in the ER.  You have a fracture to the floor of your orbital cavity.  This should heal well on its own.  Follow up with [facility] for recheck of this fracture in 3-5 days.  Expect to have swelling and bruising to your right eye.   Facial Laceration  A facial laceration is a cut on the face. These injuries can be painful and cause bleeding. Lacerations usually heal quickly, but they need special care to reduce scarring. DIAGNOSIS  Your health care provider will take a medical history, ask for details about how the injury occurred, and examine the wound to determine how deep the cut is. TREATMENT  Some facial lacerations may not require closure. Others may not be able to be closed because of an increased risk of infection. The risk of infection and the chance for successful closure will depend on various factors, including the amount of time since the injury occurred. The wound may be cleaned to help prevent infection. If closure is appropriate, pain medicines may be given if needed. Your health care provider will use stitches (sutures), wound glue (adhesive), or skin adhesive strips to repair the laceration. These tools bring the skin edges together to allow for faster healing and a better cosmetic outcome. If needed, you may also be given a tetanus shot. HOME CARE INSTRUCTIONS  Only take over-the-counter or prescription medicines as directed by your health care provider.  Follow your health care provider's instructions for wound care. These instructions will vary depending on the technique used for closing the wound. For Sutures:  Keep the wound clean and dry.   If you were given a bandage (dressing), you should change it at least once a day. Also change the dressing if it becomes wet or dirty, or as directed by your health care provider.   Wash the wound with soap and water 2 times a day.  Rinse the wound off with water to remove all soap. Pat the wound dry with a clean towel.   After cleaning, apply a thin layer of the antibiotic ointment recommended by your health care provider. This will help prevent infection and keep the dressing from sticking.   You may shower as usual after the first 24 hours. Do not soak the wound in water until the sutures are removed.   Get your sutures removed as directed by your health care provider. With facial lacerations, sutures should usually be taken out after 4 5 days to avoid stitch marks.   Wait a few days after your sutures are removed before applying any makeup. For Skin Adhesive Strips:  Keep the wound clean and dry.   Do not get the skin adhesive strips wet. You may bathe carefully, using caution to keep the wound dry.   If the wound gets wet, pat it dry with a clean towel.   Skin adhesive strips will fall off on their own. You may trim the strips as the wound heals. Do not remove skin adhesive strips that are still stuck to the wound. They will fall off in time.  For Wound Adhesive:  You may briefly wet your wound in the shower or bath. Do not soak or scrub the wound. Do not swim. Avoid periods of heavy sweating until the skin adhesive has fallen off on its own. After showering or bathing, gently pat the wound dry with a clean  towel.   Do not apply liquid medicine, cream medicine, ointment medicine, or makeup to your wound while the skin adhesive is in place. This may loosen the film before your wound is healed.   If a dressing is placed over the wound, be careful not to apply tape directly over the skin adhesive. This may cause the adhesive to be pulled off before the wound is healed.   Avoid prolonged exposure to sunlight or tanning lamps while the skin adhesive is in place.  The skin adhesive will usually remain in place for 5 10 days, then naturally fall off the skin. Do not pick at the adhesive film.  After  Healing: Once the wound has healed, cover the wound with sunscreen during the day for 1 full year. This can help minimize scarring. Exposure to ultraviolet light in the first year will darken the scar. It can take 1 2 years for the scar to lose its redness and to heal completely.  SEEK IMMEDIATE MEDICAL CARE IF:  You have redness, pain, or swelling around the wound.   You see ayellowish-white fluid (pus) coming from the wound.   You have chills or a fever.  MAKE SURE YOU:  Understand these instructions.  Will watch your condition.  Will get help right away if you are not doing well or get worse. Document Released: 12/11/2004 Document Revised: 08/24/2013 Document Reviewed: 06/16/2013 [facility] Patient Information 2014 [place], [place].  Orbital Floor Fracture, Non-Blowout The eye sits in the part of the skull called the "orbit." The upper and outside walls of the orbit are thick and strong. The inside wall (near the nose) and the orbital floor are very thin and weak. The tissues around the eye will briefly press together if there is a direct blow to the front of the eye. This leads to high pressure against the orbital walls. The inside wall and the orbital floor may break since these are the weakest walls. If the orbital floor breaks, the tissues around the eye, including the muscle that makes the eye look down, may become trapped in the sinus below when the orbital floor "blows out." If a blowout does not happen, the orbital floor fracture is considered a non-blowout orbital fracture. CAUSES An orbital floor fracture can be caused by any accident in which an object hits the face or the face strikes against a hard object. The most common ways that people break their eye socket include:  Being hit by a blunt object, such as a baseball bat or a fist.  Striking the face on the car dashboard during a crash.  Falls.  Gunshot. SYMPTOMS  If there has been no injury to the eye itself,  symptoms may include:  Puffiness (swelling) and bruising around the eye area (black eye).  Numbness of the cheek and upper gum on the side with the floor fracture. This is caused by nerve injury to these areas.  Pain around the eye.  Headache.  Ear pain on the injured side. DIAGNOSIS The diagnosis of an orbital floor fracture is suspected during an eye exam by an ophthalmologist. It is confirmed by X rays or CT scan. TREATMENT Your caregiver may suggest waiting 1 or 2 weeks for the swelling to go away before examining the eye. When the swelling lessens, your caregiver will examine the eye to see if there is any sign of a trapped muscle or double vision when looking in different directions. If double vision is not found and muscle or tissue did  not get trapped, no further treatment is necessary. After that, in almost all cases, the bones heal together on their own.  HOME CARE INSTRUCTIONS  Take all pain medicine as directed by your caregiver.  Use ice packs or other cold therapy to reduce swelling as directed by your caregiver.  Do not put a contact lens in the injured eye until your caregiver approves.  Avoid dusty environments.  Always wear protective glasses or goggles when recommended. Wearing protective eyewear is not dangerous to your injured eye and will not delay healing.  As long as your other eye is seeing normally, you may return to work and drive.  You may travel by plane or be in high altitudes. However, your swelling may take longer to go away, and you may have sinus pain.  Be aware that your depth perception and your ability to judge distance may be reduced or lost. SEEK IMMEDIATE MEDICAL CARE IF:  Your vision changes.  Your redness or swelling persists around the injured eye or gets worse.  You start to have double vision.  You have a bloody or discolored discharge from your nose.  You have a fever that lasts longer than 2 to 3 days.  You have a fever that  suddenly gets worse.  Your cheek or upper gum numbness does not go away. MAKE SURE YOU:  Understand these instructions.  Will watch your condition.  Will get help right away if you are not doing well or get worse. Document Released: 01/26/2012 Document Reviewed: 01/26/2012 [facility] Patient Information 2014 [place].

## 2014-01-30 NOTE — ED Provider Notes (Signed)
Medical screening examination/treatment/procedure(s) were conducted as a shared visit with non-physician practitioner(s) and myself.  I personally evaluated the patient during the encounter.   EKG Interpretation None       Kalman Drape, MD 01/30/14 0430

## 2014-01-30 NOTE — ED Provider Notes (Signed)
CSN: [id number]     Arrival date & time 01/29/14  2340 History   First MD Initiated Contact with Patient 01/29/14 2351     Chief Complaint  Patient presents with  . Fall  . Head Laceration     (Consider location/radiation/quality/duration/timing/severity/associated sxs/prior Treatment) HPI 68 year old female presents to emergency department after a fall.  Patient reports she tripped on a rug landed on the corner of a coffee table.  She sustained a laceration to the right eyebrow.  She denies LOC.  Patient reports her tetanus is up to date.  Patient denies any pain.  No visual changes, no blurred vision.  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 History reviewed. No pertinent past medical history. Past Surgical History  Procedure Laterality Date  . Replacement total knee bilateral    . Femur fracture surgery    . Carpal tunnel release Bilateral   . Shoulder surgery Right     rotator cuff   History reviewed. No pertinent family history. History  Substance Use Topics  . Smoking status: Never Smoker   . Smokeless tobacco: Never Used  . Alcohol Use: No   OB History   Grav Para Term Preterm Abortions TAB SAB Ect Mult Living                 Review of Systems   See History of Present Illness; otherwise all other systems are reviewed and negative  Allergies  Morphine and related  Home Medications   Current Outpatient Rx  Name  Route  Sig  Dispense  Refill  . acetaminophen (TYLENOL) 500 MG tablet   Oral   Take 500 mg by mouth every 6 (six) hours as needed for mild pain or headache.         . alendronate (FOSAMAX) 70 MG tablet   Oral   Take 1 tablet by mouth once a week. On Sundays         . gabapentin (NEURONTIN) 300 MG capsule   Oral   Take 1 capsule by mouth 3 (three) times daily.         Marland Kitchen lovastatin (MEVACOR) 40 MG tablet   Oral   Take 1 tablet by mouth daily.         . ranitidine (ZANTAC) 300 MG tablet   Oral   Take  1 tablet by mouth 2 (two) times daily as needed for heartburn.           BP 159/79  Pulse 88  Temp(Src) 97.9 F (36.6 C) (Oral)  Resp 20  SpO2 95% Physical Exam  Nursing note and vitals reviewed. Constitutional: She is oriented to person, place, and time. She appears well-developed and well-nourished.  HENT:  Head: Normocephalic.  Right Ear: External ear normal.  Left Ear: External ear normal.  Nose: Nose normal.  Mouth/Throat: Oropharynx is clear and moist.  4 cm laceration to lateral aspect of left eyebrow.  Bleeding is controlled.  Eyes: Conjunctivae and EOM are normal. Pupils are equal, round, and reactive to light.  Patient has subconjunctival hemorrhage to right lateral eye..  Vision is normal.  Extraocular motion is intact  Neck: Normal range of motion. Neck supple. No JVD present. No tracheal deviation present. No thyromegaly present.  Cardiovascular: Normal rate, regular rhythm, normal heart sounds and intact distal pulses.  Exam reveals no gallop and no friction rub.   No murmur heard. Pulmonary/Chest: Effort normal and breath sounds normal. No stridor. No respiratory distress. She has  no wheezes. She has no rales. She exhibits no tenderness.  Abdominal: Soft. Bowel sounds are normal. She exhibits no distension and no mass. There is no tenderness. There is no rebound and no guarding.  Musculoskeletal: Normal range of motion. She exhibits no edema and no tenderness.  Lymphadenopathy:    She has no cervical adenopathy.  Neurological: She is alert and oriented to person, place, and time. She has normal reflexes. No cranial nerve deficit. She exhibits normal muscle tone. Coordination normal.  Skin: Skin is warm and dry. No rash noted. No erythema. No pallor.  Psychiatric: She has a normal mood and affect. Her behavior is normal. Judgment and thought content normal.    ED Course  Procedures (including critical care time) Labs Review Labs Reviewed - No data to  display Imaging Review Ct Head Wo Contrast  01/30/2014   CLINICAL DATA:  Fall. Hit right side of head with laceration to right forehead.  EXAM: CT HEAD WITHOUT CONTRAST  CT MAXILLOFACIAL WITHOUT CONTRAST  CT CERVICAL SPINE WITHOUT CONTRAST  TECHNIQUE: Multidetector CT imaging of the head, cervical spine, and maxillofacial structures were performed using the standard protocol without intravenous contrast. Multiplanar CT image reconstructions of the cervical spine and maxillofacial structures were also generated.  COMPARISON:  None.  FINDINGS: CT HEAD FINDINGS  There is no acute intracranial hemorrhage or infarct. No mass lesion or midline shift. Gray-white matter differentiation is well maintained. Ventricles are normal in size without evidence of hydrocephalus. CSF containing spaces are within normal limits. No extra-axial fluid collection.  The calvarium is intact.  No mastoid effusion.  Contusion with laceration present at the right forehead/right periorbital region. Right orbital for fracture better evaluated on concomitant CT of the face.  CT MAXILLOFACIAL FINDINGS  There is an acute right orbital for fracture or with mild displacement. Layering blood present within the left maxillary sinus. The right intraorbital contents including the extraocular muscles remain normally located within the bony right orbit. The right globe is intact. Right periorbital soft tissue contusion with laceration is present. Remainder of the bony right orbit is intact.  Zygomatic arches are intact. Mandible is intact. Mandibular condyles are normally located within the temporomandibular fossa the bilaterally. Dental caries noted.  Maxilla is intact.  Nasal bones are intact.  CT CERVICAL SPINE FINDINGS  The vertebral bodies are normally aligned with preservation of the normal cervical lordosis. Vertebral body heights are preserved. Normal C1-2 articulations are intact. No prevertebral soft tissue swelling. No acute fracture or  listhesis.  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  Visualized soft tissues of the neck are within normal limits. Scattered atherosclerotic calcifications noted within the arterial vasculature of the neck. Visualized lung apices are clear without evidence of apical pneumothorax.  IMPRESSION: CT HEAD:  No acute intracranial process.  CT MAXILLOFACIAL:  1. Right orbital floor fracture with mild displacement. The right intraorbital contents remain normally located within the bony right orbit. Intact right globe with no retro-orbital pathology. 2. Right periorbital soft tissue contusion with laceration.  CT CERVICAL SPINE:  No acute traumatic injury within the cervical spine.   Electronically Signed   By: Jeannine Boga M.D.   On: 01/30/2014 02:07   Ct Cervical Spine Wo Contrast  01/30/2014   CLINICAL DATA:  Fall. Hit right side of head with laceration to right forehead.  EXAM: CT HEAD WITHOUT CONTRAST  CT MAXILLOFACIAL WITHOUT CONTRAST  CT CERVICAL SPINE WITHOUT CONTRAST  TECHNIQUE: Multidetector CT imaging of the head, cervical spine, and maxillofacial structures were performed using the standard protocol without intravenous contrast. Multiplanar CT image reconstructions of the cervical spine and maxillofacial structures were also generated.  COMPARISON:  None.  FINDINGS: CT HEAD FINDINGS  There is no acute intracranial hemorrhage or infarct. No mass lesion or midline shift. Gray-white matter differentiation is well maintained. Ventricles are normal in size without evidence of hydrocephalus. CSF containing spaces are within normal limits. No extra-axial fluid collection.  The calvarium is intact.  No mastoid effusion.  Contusion with laceration present at the right forehead/right periorbital region. Right orbital for fracture better evaluated on concomitant CT of the face.  CT MAXILLOFACIAL FINDINGS  There is an acute right orbital for fracture  or with mild displacement. Layering blood present within the left maxillary sinus. The right intraorbital contents including the extraocular muscles remain normally located within the bony right orbit. The right globe is intact. Right periorbital soft tissue contusion with laceration is present. Remainder of the bony right orbit is intact.  Zygomatic arches are intact. Mandible is intact. Mandibular condyles are normally located within the temporomandibular fossa the bilaterally. Dental caries noted.  Maxilla is intact.  Nasal bones are intact.  CT CERVICAL SPINE FINDINGS  The vertebral bodies are normally aligned with preservation of the normal cervical lordosis. Vertebral body heights are preserved. Normal C1-2 articulations are intact. No prevertebral soft tissue swelling. No acute fracture or listhesis.  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  Visualized soft tissues of the neck are within normal limits. Scattered atherosclerotic calcifications noted within the arterial vasculature of the neck. Visualized lung apices are clear without evidence of apical pneumothorax.  IMPRESSION: CT HEAD:  No acute intracranial process.  CT MAXILLOFACIAL:  1. Right orbital floor fracture with mild displacement. The right intraorbital contents remain normally located within the bony right orbit. Intact right globe with no retro-orbital pathology. 2. Right periorbital soft tissue contusion with laceration.  CT CERVICAL SPINE:  No acute traumatic injury within the cervical spine.   Electronically Signed   By: Jeannine Boga M.D.   On: 01/30/2014 02:07   Ct Maxillofacial Wo Cm  01/30/2014   CLINICAL DATA:  Fall. Hit right side of head with laceration to right forehead.  EXAM: CT HEAD WITHOUT CONTRAST  CT MAXILLOFACIAL WITHOUT CONTRAST  CT CERVICAL SPINE WITHOUT CONTRAST  TECHNIQUE: Multidetector CT imaging of the head, cervical spine, and maxillofacial  structures were performed using the standard protocol without intravenous contrast. Multiplanar CT image reconstructions of the cervical spine and maxillofacial structures were also generated.  COMPARISON:  None.  FINDINGS: CT HEAD FINDINGS  There is no acute intracranial hemorrhage or infarct. No mass lesion or midline shift. Gray-white matter differentiation is well maintained. Ventricles are normal in size without evidence of hydrocephalus. CSF containing spaces are within normal limits. No extra-axial fluid collection.  The calvarium is intact.  No mastoid effusion.  Contusion with laceration present at the right forehead/right periorbital region. Right orbital for fracture better evaluated on concomitant CT of the face.  CT MAXILLOFACIAL FINDINGS  There is an acute right orbital for fracture or with mild displacement. Layering blood present within the left maxillary sinus. The right intraorbital contents including the extraocular muscles remain normally located within the bony right orbit. The right globe is intact. Right periorbital soft tissue contusion with laceration is present. Remainder of the bony right orbit is  intact.  Zygomatic arches are intact. Mandible is intact. Mandibular condyles are normally located within the temporomandibular fossa the bilaterally. Dental caries noted.  Maxilla is intact.  Nasal bones are intact.  CT CERVICAL SPINE FINDINGS  The vertebral bodies are normally aligned with preservation of the normal cervical lordosis. Vertebral body heights are preserved. Normal C1-2 articulations are intact. No prevertebral soft tissue swelling. No acute fracture or listhesis.  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  Visualized soft tissues of the neck are within normal limits. Scattered atherosclerotic calcifications noted within the arterial vasculature of the neck. Visualized lung apices are clear without evidence of apical  pneumothorax.  IMPRESSION: CT HEAD:  No acute intracranial process.  CT MAXILLOFACIAL:  1. Right orbital floor fracture with mild displacement. The right intraorbital contents remain normally located within the bony right orbit. Intact right globe with no retro-orbital pathology. 2. Right periorbital soft tissue contusion with laceration.  CT CERVICAL SPINE:  No acute traumatic injury within the cervical spine.   Electronically Signed   By: Jeannine Boga M.D.   On: 01/30/2014 02:07     EKG Interpretation None      MDM   Final diagnoses:  Fall at home  Facial laceration  Fracture of orbital floor  Subconjunctival hemorrhage of right eye    68 year old female status post fall.  Laceration repaired by Montine Circle PA.  CT scan was ordered.  Given history of osteoporosis and bone spurs in the cervical spine.    Kalman Drape, MD 01/30/14 [phone]

## 2014-01-30 NOTE — ED Provider Notes (Signed)
I was asked by Dr. Sharol Given to repair the patient's right eyebrow laceration.  12:46 AM LACERATION REPAIR Performed by: Montine Circle Authorized by: Montine Circle Consent: Verbal consent obtained. Risks and benefits: risks, benefits and alternatives were discussed Consent given by: patient Patient identity confirmed: provided demographic data Prepped and Draped in normal sterile fashion Wound explored  Laceration Location: Right eyebrow laceration  Laceration Length: 5 cm  No Foreign Bodies seen or palpated  Anesthesia: local infiltration  Local anesthetic: lidocaine 2% with epinephrine  Anesthetic total: 4 ml  Irrigation method: syringe Amount of cleaning: standard  Skin closure: 5-0 prolene  Number of sutures: 10  Technique: interrupted  Patient tolerance: Patient tolerated the procedure well with no immediate complications.  Laceration repaired without complication. Patient given instructions regarding wound healing, and scar minimization. Care to be continued by Dr. Reece Leader, PA-C 01/30/14 [phone]

## 2014-12-28 DIAGNOSIS — H25811 Combined forms of age-related cataract, right eye: Secondary | ICD-10-CM | POA: Diagnosis not present

## 2014-12-28 DIAGNOSIS — H2511 Age-related nuclear cataract, right eye: Secondary | ICD-10-CM | POA: Diagnosis not present

## 2015-01-11 DIAGNOSIS — R7309 Other abnormal glucose: Secondary | ICD-10-CM | POA: Diagnosis not present

## 2015-03-16 DIAGNOSIS — H04123 Dry eye syndrome of bilateral lacrimal glands: Secondary | ICD-10-CM | POA: Diagnosis not present

## 2015-03-16 DIAGNOSIS — Z961 Presence of intraocular lens: Secondary | ICD-10-CM | POA: Diagnosis not present

## 2015-05-10 DIAGNOSIS — Z885 Allergy status to narcotic agent status: Secondary | ICD-10-CM | POA: Diagnosis not present

## 2015-05-10 DIAGNOSIS — H02423 Myogenic ptosis of bilateral eyelids: Secondary | ICD-10-CM | POA: Diagnosis not present

## 2015-05-10 DIAGNOSIS — Z961 Presence of intraocular lens: Secondary | ICD-10-CM | POA: Diagnosis not present

## 2015-05-10 DIAGNOSIS — M8580 Other specified disorders of bone density and structure, unspecified site: Secondary | ICD-10-CM | POA: Diagnosis not present

## 2015-05-10 DIAGNOSIS — H5709 Other anomalies of pupillary function: Secondary | ICD-10-CM | POA: Diagnosis not present

## 2015-05-10 DIAGNOSIS — E780 Pure hypercholesterolemia: Secondary | ICD-10-CM | POA: Diagnosis not present

## 2015-06-08 DIAGNOSIS — R739 Hyperglycemia, unspecified: Secondary | ICD-10-CM | POA: Diagnosis not present

## 2015-06-08 DIAGNOSIS — M542 Cervicalgia: Secondary | ICD-10-CM | POA: Diagnosis not present

## 2015-06-08 DIAGNOSIS — K219 Gastro-esophageal reflux disease without esophagitis: Secondary | ICD-10-CM | POA: Diagnosis not present

## 2015-06-08 DIAGNOSIS — E780 Pure hypercholesterolemia: Secondary | ICD-10-CM | POA: Diagnosis not present

## 2015-06-08 DIAGNOSIS — H8113 Benign paroxysmal vertigo, bilateral: Secondary | ICD-10-CM | POA: Diagnosis not present

## 2015-06-08 DIAGNOSIS — M859 Disorder of bone density and structure, unspecified: Secondary | ICD-10-CM | POA: Diagnosis not present

## 2015-06-19 DIAGNOSIS — H02423 Myogenic ptosis of bilateral eyelids: Secondary | ICD-10-CM | POA: Diagnosis not present

## 2015-06-19 DIAGNOSIS — H02403 Unspecified ptosis of bilateral eyelids: Secondary | ICD-10-CM | POA: Diagnosis not present

## 2015-08-29 DIAGNOSIS — Z1231 Encounter for screening mammogram for malignant neoplasm of breast: Secondary | ICD-10-CM | POA: Diagnosis not present

## 2015-09-06 DIAGNOSIS — H02403 Unspecified ptosis of bilateral eyelids: Secondary | ICD-10-CM | POA: Diagnosis not present

## 2015-09-13 DIAGNOSIS — H02423 Myogenic ptosis of bilateral eyelids: Secondary | ICD-10-CM | POA: Diagnosis not present

## 2015-11-28 DIAGNOSIS — E785 Hyperlipidemia, unspecified: Secondary | ICD-10-CM | POA: Diagnosis not present

## 2015-11-28 DIAGNOSIS — K219 Gastro-esophageal reflux disease without esophagitis: Secondary | ICD-10-CM | POA: Diagnosis not present

## 2015-11-28 DIAGNOSIS — G5603 Carpal tunnel syndrome, bilateral upper limbs: Secondary | ICD-10-CM | POA: Diagnosis not present

## 2015-11-28 DIAGNOSIS — H02423 Myogenic ptosis of bilateral eyelids: Secondary | ICD-10-CM | POA: Diagnosis not present

## 2015-11-28 DIAGNOSIS — Z7982 Long term (current) use of aspirin: Secondary | ICD-10-CM | POA: Diagnosis not present

## 2015-11-28 DIAGNOSIS — H02403 Unspecified ptosis of bilateral eyelids: Secondary | ICD-10-CM | POA: Diagnosis not present

## 2015-11-28 DIAGNOSIS — E7800 Pure hypercholesterolemia, unspecified: Secondary | ICD-10-CM | POA: Diagnosis not present

## 2016-03-18 DIAGNOSIS — H04123 Dry eye syndrome of bilateral lacrimal glands: Secondary | ICD-10-CM | POA: Diagnosis not present

## 2016-03-18 DIAGNOSIS — Z0100 Encounter for examination of eyes and vision without abnormal findings: Secondary | ICD-10-CM | POA: Diagnosis not present

## 2016-03-18 DIAGNOSIS — Z961 Presence of intraocular lens: Secondary | ICD-10-CM | POA: Diagnosis not present

## 2016-06-06 DIAGNOSIS — H8110 Benign paroxysmal vertigo, unspecified ear: Secondary | ICD-10-CM | POA: Diagnosis not present

## 2016-06-06 DIAGNOSIS — E7800 Pure hypercholesterolemia, unspecified: Secondary | ICD-10-CM | POA: Diagnosis not present

## 2016-06-06 DIAGNOSIS — M545 Low back pain: Secondary | ICD-10-CM | POA: Diagnosis not present

## 2016-06-06 DIAGNOSIS — Z1211 Encounter for screening for malignant neoplasm of colon: Secondary | ICD-10-CM | POA: Diagnosis not present

## 2016-06-06 DIAGNOSIS — Z1389 Encounter for screening for other disorder: Secondary | ICD-10-CM | POA: Diagnosis not present

## 2016-06-06 DIAGNOSIS — G8929 Other chronic pain: Secondary | ICD-10-CM | POA: Diagnosis not present

## 2016-06-06 DIAGNOSIS — K219 Gastro-esophageal reflux disease without esophagitis: Secondary | ICD-10-CM | POA: Diagnosis not present

## 2016-06-06 DIAGNOSIS — R7301 Impaired fasting glucose: Secondary | ICD-10-CM | POA: Diagnosis not present

## 2016-06-06 DIAGNOSIS — M8580 Other specified disorders of bone density and structure, unspecified site: Secondary | ICD-10-CM | POA: Diagnosis not present

## 2016-08-29 DIAGNOSIS — Z780 Asymptomatic menopausal state: Secondary | ICD-10-CM | POA: Diagnosis not present

## 2016-08-29 DIAGNOSIS — Z1231 Encounter for screening mammogram for malignant neoplasm of breast: Secondary | ICD-10-CM | POA: Diagnosis not present

## 2016-09-15 DIAGNOSIS — H04123 Dry eye syndrome of bilateral lacrimal glands: Secondary | ICD-10-CM | POA: Diagnosis not present

## 2016-10-01 DIAGNOSIS — K5730 Diverticulosis of large intestine without perforation or abscess without bleeding: Secondary | ICD-10-CM | POA: Diagnosis not present

## 2016-10-01 DIAGNOSIS — Z1211 Encounter for screening for malignant neoplasm of colon: Secondary | ICD-10-CM | POA: Diagnosis not present

## 2016-12-09 DIAGNOSIS — M542 Cervicalgia: Secondary | ICD-10-CM | POA: Diagnosis not present

## 2016-12-09 DIAGNOSIS — H8113 Benign paroxysmal vertigo, bilateral: Secondary | ICD-10-CM | POA: Diagnosis not present

## 2016-12-09 DIAGNOSIS — M8580 Other specified disorders of bone density and structure, unspecified site: Secondary | ICD-10-CM | POA: Diagnosis not present

## 2016-12-09 DIAGNOSIS — R7309 Other abnormal glucose: Secondary | ICD-10-CM | POA: Diagnosis not present

## 2016-12-09 DIAGNOSIS — E7800 Pure hypercholesterolemia, unspecified: Secondary | ICD-10-CM | POA: Diagnosis not present

## 2016-12-09 DIAGNOSIS — K219 Gastro-esophageal reflux disease without esophagitis: Secondary | ICD-10-CM | POA: Diagnosis not present

## 2017-03-17 DIAGNOSIS — H04123 Dry eye syndrome of bilateral lacrimal glands: Secondary | ICD-10-CM | POA: Diagnosis not present

## 2017-04-15 DIAGNOSIS — M8580 Other specified disorders of bone density and structure, unspecified site: Secondary | ICD-10-CM | POA: Diagnosis not present

## 2017-04-15 DIAGNOSIS — K219 Gastro-esophageal reflux disease without esophagitis: Secondary | ICD-10-CM | POA: Diagnosis not present

## 2017-04-15 DIAGNOSIS — M47816 Spondylosis without myelopathy or radiculopathy, lumbar region: Secondary | ICD-10-CM | POA: Diagnosis not present

## 2017-04-15 DIAGNOSIS — E7800 Pure hypercholesterolemia, unspecified: Secondary | ICD-10-CM | POA: Diagnosis not present

## 2017-04-15 DIAGNOSIS — D1722 Benign lipomatous neoplasm of skin and subcutaneous tissue of left arm: Secondary | ICD-10-CM | POA: Diagnosis not present

## 2017-04-15 DIAGNOSIS — Z1159 Encounter for screening for other viral diseases: Secondary | ICD-10-CM | POA: Diagnosis not present

## 2017-04-15 DIAGNOSIS — Z23 Encounter for immunization: Secondary | ICD-10-CM | POA: Diagnosis not present

## 2017-04-15 DIAGNOSIS — Z0000 Encounter for general adult medical examination without abnormal findings: Secondary | ICD-10-CM | POA: Diagnosis not present

## 2017-04-15 DIAGNOSIS — H8110 Benign paroxysmal vertigo, unspecified ear: Secondary | ICD-10-CM | POA: Diagnosis not present

## 2017-04-15 DIAGNOSIS — R7303 Prediabetes: Secondary | ICD-10-CM | POA: Diagnosis not present

## 2017-05-11 ENCOUNTER — Ambulatory Visit: Payer: Self-pay | Admitting: Surgery

## 2017-05-11 DIAGNOSIS — R229 Localized swelling, mass and lump, unspecified: Secondary | ICD-10-CM | POA: Diagnosis not present

## 2017-05-11 NOTE — H&P (Signed)
History of Present Illness Amy [name]. Amy Bousquet MD; 05/11/2017 3:25 PM) The patient is a 71 year old female who presents with a complaint of Mass. Referred by Dr. Shirline Frees for palpable masses left forearm This is a 71 year old female who presents with several years of some slowly enlarging masses in her left forearm. She can palpate at least 3 of these. These seem to be slightly enlarged. They have become more uncomfortable. When she tries to wash dishes these areas rub against the side of the sink causing significant discomfort. They have never become infected. They remain mobile. There is not been any imaging.   Past Surgical History Dalbert Mayotte, [place]; 05/11/2017 10:16 AM) Cataract Surgery Bilateral. Knee Surgery Bilateral. Oral Surgery  Diagnostic Studies History Dalbert Mayotte, [place]; 05/11/2017 10:16 AM) Colonoscopy within last year Mammogram within last year Pap Smear >5 years ago  Allergies Dalbert Mayotte, CMA; 05/11/2017 10:18 AM) Codeine/Codeine Derivatives Morphine Sulfate in Dextrose *ANALGESICS - OPIOID* Allergies Reconciled  Medication History Dalbert Mayotte, CMA; 05/11/2017 10:19 AM) Gabapentin (300MG  Capsule, Oral) Active. Lovastatin (40MG  Tablet, Oral) Active. RaNITidine HCl (300MG  Tablet, Oral) Active. Fosamax (Oral) Specific strength unknown - Active. Methocarbamol (750MG  Tablet, Oral) Active. Multiple Vitamin (1 (one) Oral) Active. Fish Oil Concentrate (435MG  Capsule, 1 (one) Oral) Active. Flax Seed Oil (1000MG  Capsule, 1 (one) Oral) Active. Medications Reconciled  Social History Dalbert Mayotte, [place]; 05/11/2017 10:16 AM) Caffeine use Coffee. No alcohol use No drug use Tobacco use Never smoker.  Family History Dalbert Mayotte, [place]; 05/11/2017 10:16 AM) Alcohol Abuse Brother. Anesthetic complications Father, Mother. Colon Polyps Sister. Diabetes Mellitus Brother. Heart Disease Mother.  Pregnancy / Birth History Dalbert Mayotte, [place]; 05/11/2017 10:16 AM) Age at menarche 34 years. Age of menopause 3-55 Gravida 1 Maternal age 41-20 Para 1  Other Problems Dalbert Mayotte, [place]; 05/11/2017 10:16 AM) Arthritis Back Pain Diverticulosis Gastroesophageal Reflux Disease General anesthesia - complications Hypercholesterolemia     Review of Systems Dalbert Mayotte CMA; 05/11/2017 10:16 AM) General Not Present- Appetite Loss, Chills, Fatigue, Fever, Night Sweats, Weight Gain and Weight Loss. Skin Not Present- Change in Wart/Mole, Dryness, Hives, Jaundice, New Lesions, Non-Healing Wounds, Rash and Ulcer. HEENT Present- Seasonal Allergies, Sinus Pain and Wears glasses/contact lenses. Not Present- Earache, Hearing Loss, Hoarseness, Nose Bleed, Oral Ulcers, Ringing in the Ears, Sore Throat, Visual Disturbances and Yellow Eyes. Respiratory Not Present- Bloody sputum, Chronic Cough, Difficulty Breathing, Snoring and Wheezing. Breast Not Present- Breast Mass, Breast Pain, Nipple Discharge and Skin Changes. Gastrointestinal Not Present- Abdominal Pain, Bloating, Bloody Stool, Change in Bowel Habits, Chronic diarrhea, Constipation, Difficulty Swallowing, Excessive gas, Gets full quickly at meals, Hemorrhoids, Indigestion, Nausea, Rectal Pain and Vomiting. Female Genitourinary Present- Frequency and Nocturia. Not Present- Painful Urination, Pelvic Pain and Urgency. Musculoskeletal Present- Back Pain and Joint Stiffness. Not Present- Joint Pain, Muscle Pain, Muscle Weakness and Swelling of Extremities. Neurological Not Present- Decreased Memory, Fainting, Headaches, Numbness, Seizures, Tingling, Tremor, Trouble walking and Weakness. Psychiatric Not Present- Anxiety, Bipolar, Change in Sleep Pattern, Depression, Fearful and Frequent crying. Endocrine Not Present- Cold Intolerance, Excessive Hunger, Hair Changes, Heat Intolerance, Hot flashes and New Diabetes. Hematology Not Present- Blood Thinners, Easy Bruising,  Excessive bleeding, Gland problems, HIV and Persistent Infections.  Vitals Dalbert Mayotte CMA; 05/11/2017 10:20 AM) 05/11/2017 10:20 AM Weight: 190.2 lb Height: 58in Body Surface Area: 1.78 m Body Mass Index: 39.75 kg/m  Temp.: 97.37F  Pulse: 97 (Regular)  BP: 140/90 (Sitting, Left Arm, Standard)      Physical Exam Rodman Key K. Shateka Petrea  MD; 05/11/2017 3:26 PM)  The physical exam findings are as follows: Note:WDWN in NAD Left forearm - multiple subcutaneous nodules - firm, mobile, no skin changes More proximal is 4 cm and is firm and nodular More distal are two adjacent masses - 2 cm and 3 cm - firm and nodular    Assessment & Plan Rodman Key K. Mitsue Peery MD; 05/11/2017 10:29 AM)  MASS OF SUBCUTANEOUS TISSUE (R22.9) Impression: Left forearm - 4 cm, 3 cm, 2 cm  Current Plans Schedule for Surgery. Excision of multiple subcutaneous masses of the left forearm. The surgical procedure has been discussed with the patient. Potential risks, benefits, alternative treatments, and expected outcomes have been explained. All of the patient's questions at this time have been answered. The likelihood of reaching the patient's treatment goal is good. The patient understand the proposed surgical procedure and wishes to proceed.  Amy [name]. Georgette Dover, MD, [facility] Surgery  General/ Trauma Surgery  05/11/2017 3:26 PM

## 2017-05-26 DIAGNOSIS — M7989 Other specified soft tissue disorders: Secondary | ICD-10-CM | POA: Diagnosis not present

## 2017-05-26 DIAGNOSIS — M0639 Rheumatoid nodule, multiple sites: Secondary | ICD-10-CM | POA: Diagnosis not present

## 2017-05-26 DIAGNOSIS — D2112 Benign neoplasm of connective and other soft tissue of left upper limb, including shoulder: Secondary | ICD-10-CM | POA: Diagnosis not present

## 2017-06-09 DIAGNOSIS — H8110 Benign paroxysmal vertigo, unspecified ear: Secondary | ICD-10-CM | POA: Diagnosis not present

## 2017-06-09 DIAGNOSIS — E7800 Pure hypercholesterolemia, unspecified: Secondary | ICD-10-CM | POA: Diagnosis not present

## 2017-06-09 DIAGNOSIS — K219 Gastro-esophageal reflux disease without esophagitis: Secondary | ICD-10-CM | POA: Diagnosis not present

## 2017-06-09 DIAGNOSIS — G8929 Other chronic pain: Secondary | ICD-10-CM | POA: Diagnosis not present

## 2017-06-09 DIAGNOSIS — Z1159 Encounter for screening for other viral diseases: Secondary | ICD-10-CM | POA: Diagnosis not present

## 2017-06-09 DIAGNOSIS — R7303 Prediabetes: Secondary | ICD-10-CM | POA: Diagnosis not present

## 2017-07-01 DIAGNOSIS — E119 Type 2 diabetes mellitus without complications: Secondary | ICD-10-CM | POA: Diagnosis not present

## 2017-07-01 DIAGNOSIS — N3000 Acute cystitis without hematuria: Secondary | ICD-10-CM | POA: Diagnosis not present

## 2017-07-01 DIAGNOSIS — R8290 Unspecified abnormal findings in urine: Secondary | ICD-10-CM | POA: Diagnosis not present

## 2017-07-01 DIAGNOSIS — L03114 Cellulitis of left upper limb: Secondary | ICD-10-CM | POA: Diagnosis not present

## 2017-09-11 DIAGNOSIS — Z1231 Encounter for screening mammogram for malignant neoplasm of breast: Secondary | ICD-10-CM | POA: Diagnosis not present

## 2017-10-02 DIAGNOSIS — E119 Type 2 diabetes mellitus without complications: Secondary | ICD-10-CM | POA: Diagnosis not present

## 2017-12-10 DIAGNOSIS — E119 Type 2 diabetes mellitus without complications: Secondary | ICD-10-CM | POA: Diagnosis not present

## 2017-12-10 DIAGNOSIS — M47816 Spondylosis without myelopathy or radiculopathy, lumbar region: Secondary | ICD-10-CM | POA: Diagnosis not present

## 2017-12-10 DIAGNOSIS — K219 Gastro-esophageal reflux disease without esophagitis: Secondary | ICD-10-CM | POA: Diagnosis not present

## 2017-12-10 DIAGNOSIS — E7800 Pure hypercholesterolemia, unspecified: Secondary | ICD-10-CM | POA: Diagnosis not present

## 2018-03-18 DIAGNOSIS — H04123 Dry eye syndrome of bilateral lacrimal glands: Secondary | ICD-10-CM | POA: Diagnosis not present

## 2018-03-18 DIAGNOSIS — Z961 Presence of intraocular lens: Secondary | ICD-10-CM | POA: Diagnosis not present

## 2018-03-18 DIAGNOSIS — H524 Presbyopia: Secondary | ICD-10-CM | POA: Diagnosis not present

## 2018-06-09 DIAGNOSIS — E119 Type 2 diabetes mellitus without complications: Secondary | ICD-10-CM | POA: Diagnosis not present

## 2018-06-09 DIAGNOSIS — G8929 Other chronic pain: Secondary | ICD-10-CM | POA: Diagnosis not present

## 2018-06-09 DIAGNOSIS — K219 Gastro-esophageal reflux disease without esophagitis: Secondary | ICD-10-CM | POA: Diagnosis not present

## 2018-06-09 DIAGNOSIS — E7800 Pure hypercholesterolemia, unspecified: Secondary | ICD-10-CM | POA: Diagnosis not present

## 2018-06-09 DIAGNOSIS — M47816 Spondylosis without myelopathy or radiculopathy, lumbar region: Secondary | ICD-10-CM | POA: Diagnosis not present

## 2018-06-09 DIAGNOSIS — H8110 Benign paroxysmal vertigo, unspecified ear: Secondary | ICD-10-CM | POA: Diagnosis not present

## 2018-06-09 DIAGNOSIS — E2839 Other primary ovarian failure: Secondary | ICD-10-CM | POA: Diagnosis not present

## 2018-06-09 DIAGNOSIS — Z0000 Encounter for general adult medical examination without abnormal findings: Secondary | ICD-10-CM | POA: Diagnosis not present

## 2018-09-13 DIAGNOSIS — S7291XD Unspecified fracture of right femur, subsequent encounter for closed fracture with routine healing: Secondary | ICD-10-CM | POA: Diagnosis not present

## 2018-09-13 DIAGNOSIS — Z96653 Presence of artificial knee joint, bilateral: Secondary | ICD-10-CM | POA: Diagnosis not present

## 2018-09-13 DIAGNOSIS — E349 Endocrine disorder, unspecified: Secondary | ICD-10-CM | POA: Diagnosis not present

## 2018-09-13 DIAGNOSIS — Z780 Asymptomatic menopausal state: Secondary | ICD-10-CM | POA: Diagnosis not present

## 2018-09-13 DIAGNOSIS — Z1231 Encounter for screening mammogram for malignant neoplasm of breast: Secondary | ICD-10-CM | POA: Diagnosis not present

## 2018-12-16 DIAGNOSIS — E119 Type 2 diabetes mellitus without complications: Secondary | ICD-10-CM | POA: Diagnosis not present

## 2018-12-16 DIAGNOSIS — K219 Gastro-esophageal reflux disease without esophagitis: Secondary | ICD-10-CM | POA: Diagnosis not present

## 2018-12-16 DIAGNOSIS — M542 Cervicalgia: Secondary | ICD-10-CM | POA: Diagnosis not present

## 2018-12-16 DIAGNOSIS — H8110 Benign paroxysmal vertigo, unspecified ear: Secondary | ICD-10-CM | POA: Diagnosis not present

## 2018-12-16 DIAGNOSIS — E7800 Pure hypercholesterolemia, unspecified: Secondary | ICD-10-CM | POA: Diagnosis not present

## 2019-03-21 DIAGNOSIS — H524 Presbyopia: Secondary | ICD-10-CM | POA: Diagnosis not present

## 2019-03-21 DIAGNOSIS — H04123 Dry eye syndrome of bilateral lacrimal glands: Secondary | ICD-10-CM | POA: Diagnosis not present

## 2019-03-21 DIAGNOSIS — Z961 Presence of intraocular lens: Secondary | ICD-10-CM | POA: Diagnosis not present

## 2019-07-08 DIAGNOSIS — M47816 Spondylosis without myelopathy or radiculopathy, lumbar region: Secondary | ICD-10-CM | POA: Diagnosis not present

## 2019-07-08 DIAGNOSIS — H8110 Benign paroxysmal vertigo, unspecified ear: Secondary | ICD-10-CM | POA: Diagnosis not present

## 2019-07-08 DIAGNOSIS — Z0000 Encounter for general adult medical examination without abnormal findings: Secondary | ICD-10-CM | POA: Diagnosis not present

## 2019-07-08 DIAGNOSIS — K219 Gastro-esophageal reflux disease without esophagitis: Secondary | ICD-10-CM | POA: Diagnosis not present

## 2019-07-08 DIAGNOSIS — E7800 Pure hypercholesterolemia, unspecified: Secondary | ICD-10-CM | POA: Diagnosis not present

## 2019-07-08 DIAGNOSIS — E119 Type 2 diabetes mellitus without complications: Secondary | ICD-10-CM | POA: Diagnosis not present

## 2019-08-08 DIAGNOSIS — E119 Type 2 diabetes mellitus without complications: Secondary | ICD-10-CM | POA: Diagnosis not present

## 2019-08-08 DIAGNOSIS — E7800 Pure hypercholesterolemia, unspecified: Secondary | ICD-10-CM | POA: Diagnosis not present

## 2020-03-22 DIAGNOSIS — H524 Presbyopia: Secondary | ICD-10-CM | POA: Diagnosis not present

## 2020-03-22 DIAGNOSIS — Z961 Presence of intraocular lens: Secondary | ICD-10-CM | POA: Diagnosis not present

## 2021-03-07 DIAGNOSIS — Z1231 Encounter for screening mammogram for malignant neoplasm of breast: Secondary | ICD-10-CM | POA: Diagnosis not present

## 2021-06-28 DIAGNOSIS — G8929 Other chronic pain: Secondary | ICD-10-CM | POA: Diagnosis not present

## 2021-06-28 DIAGNOSIS — E7800 Pure hypercholesterolemia, unspecified: Secondary | ICD-10-CM | POA: Diagnosis not present

## 2021-06-28 DIAGNOSIS — H8110 Benign paroxysmal vertigo, unspecified ear: Secondary | ICD-10-CM | POA: Diagnosis not present

## 2021-06-28 DIAGNOSIS — K219 Gastro-esophageal reflux disease without esophagitis: Secondary | ICD-10-CM | POA: Diagnosis not present

## 2021-06-28 DIAGNOSIS — E119 Type 2 diabetes mellitus without complications: Secondary | ICD-10-CM | POA: Diagnosis not present

## 2021-10-14 ENCOUNTER — Ambulatory Visit (HOSPITAL_COMMUNITY): Admission: EM | Admit: 2021-10-14 | Discharge: 2021-10-14 | Payer: Medicare HMO

## 2021-10-14 ENCOUNTER — Emergency Department (HOSPITAL_COMMUNITY): Payer: Medicare HMO

## 2021-10-14 ENCOUNTER — Encounter (HOSPITAL_COMMUNITY): Payer: Self-pay

## 2021-10-14 ENCOUNTER — Other Ambulatory Visit: Payer: Self-pay

## 2021-10-14 ENCOUNTER — Inpatient Hospital Stay (HOSPITAL_COMMUNITY)
Admission: EM | Admit: 2021-10-14 | Discharge: 2021-10-23 | DRG: 286 | Disposition: A | Discharge status: Home Health | Payer: Medicare HMO | Attending: Family Medicine | Admitting: Family Medicine

## 2021-10-14 DIAGNOSIS — R0602 Shortness of breath: Secondary | ICD-10-CM | POA: Diagnosis not present

## 2021-10-14 DIAGNOSIS — Z79899 Other long term (current) drug therapy: Secondary | ICD-10-CM | POA: Diagnosis not present

## 2021-10-14 DIAGNOSIS — U071 COVID-19: Secondary | ICD-10-CM | POA: Diagnosis present

## 2021-10-14 DIAGNOSIS — Z6839 Body mass index (BMI) 39.0-39.9, adult: Secondary | ICD-10-CM | POA: Diagnosis not present

## 2021-10-14 DIAGNOSIS — I509 Heart failure, unspecified: Secondary | ICD-10-CM | POA: Diagnosis present

## 2021-10-14 DIAGNOSIS — I350 Nonrheumatic aortic (valve) stenosis: Secondary | ICD-10-CM | POA: Diagnosis present

## 2021-10-14 DIAGNOSIS — I272 Pulmonary hypertension, unspecified: Secondary | ICD-10-CM | POA: Diagnosis present

## 2021-10-14 DIAGNOSIS — Z7983 Long term (current) use of bisphosphonates: Secondary | ICD-10-CM

## 2021-10-14 DIAGNOSIS — I5041 Acute combined systolic (congestive) and diastolic (congestive) heart failure: Secondary | ICD-10-CM | POA: Diagnosis present

## 2021-10-14 DIAGNOSIS — Z96653 Presence of artificial knee joint, bilateral: Secondary | ICD-10-CM | POA: Diagnosis present

## 2021-10-14 DIAGNOSIS — K219 Gastro-esophageal reflux disease without esophagitis: Secondary | ICD-10-CM | POA: Diagnosis present

## 2021-10-14 DIAGNOSIS — E878 Other disorders of electrolyte and fluid balance, not elsewhere classified: Secondary | ICD-10-CM | POA: Diagnosis present

## 2021-10-14 DIAGNOSIS — R0902 Hypoxemia: Secondary | ICD-10-CM

## 2021-10-14 DIAGNOSIS — R531 Weakness: Secondary | ICD-10-CM | POA: Diagnosis present

## 2021-10-14 DIAGNOSIS — E119 Type 2 diabetes mellitus without complications: Secondary | ICD-10-CM | POA: Diagnosis present

## 2021-10-14 DIAGNOSIS — I2510 Atherosclerotic heart disease of native coronary artery without angina pectoris: Secondary | ICD-10-CM | POA: Diagnosis present

## 2021-10-14 DIAGNOSIS — E785 Hyperlipidemia, unspecified: Secondary | ICD-10-CM | POA: Diagnosis present

## 2021-10-14 DIAGNOSIS — Z888 Allergy status to other drugs, medicaments and biological substances status: Secondary | ICD-10-CM

## 2021-10-14 DIAGNOSIS — I4510 Unspecified right bundle-branch block: Secondary | ICD-10-CM | POA: Diagnosis present

## 2021-10-14 DIAGNOSIS — L03116 Cellulitis of left lower limb: Secondary | ICD-10-CM | POA: Diagnosis present

## 2021-10-14 DIAGNOSIS — I5021 Acute systolic (congestive) heart failure: Secondary | ICD-10-CM | POA: Diagnosis not present

## 2021-10-14 DIAGNOSIS — Z885 Allergy status to narcotic agent status: Secondary | ICD-10-CM

## 2021-10-14 DIAGNOSIS — E871 Hypo-osmolality and hyponatremia: Secondary | ICD-10-CM | POA: Diagnosis present

## 2021-10-14 DIAGNOSIS — I110 Hypertensive heart disease with heart failure: Secondary | ICD-10-CM | POA: Diagnosis present

## 2021-10-14 DIAGNOSIS — Z751 Person awaiting admission to adequate facility elsewhere: Secondary | ICD-10-CM

## 2021-10-14 DIAGNOSIS — J811 Chronic pulmonary edema: Secondary | ICD-10-CM | POA: Diagnosis not present

## 2021-10-14 DIAGNOSIS — I517 Cardiomegaly: Secondary | ICD-10-CM | POA: Diagnosis not present

## 2021-10-14 DIAGNOSIS — E876 Hypokalemia: Secondary | ICD-10-CM | POA: Diagnosis present

## 2021-10-14 HISTORY — DX: Hyperlipidemia, unspecified: E78.5

## 2021-10-14 HISTORY — DX: Prediabetes: R73.03

## 2021-10-14 HISTORY — DX: Gastro-esophageal reflux disease without esophagitis: K21.9

## 2021-10-14 HISTORY — DX: Obesity, unspecified: E66.9

## 2021-10-14 NOTE — ED Triage Notes (Signed)
Pt presents with cough, congestion, sore throat and shortness of breath X 2 weeks.

## 2021-10-14 NOTE — ED Provider Notes (Signed)
Called into triage to assess patient given hypoxia with oxygenation of 87% on room air.  Patient reports a several week history of cough and shortness of breath.  She denies history of chronic lung condition and does not have oxygen at home.  Discussed that given increased oxygen dependence she would need to go to the emergency room for further evaluation and management.  She is agreeable to this and will go directly to [facility] emergency room.  Offered [facility] transport but friend preferred to transfer her by private vehicle.   Terrilee Croak, PA-C 10/14/21 2018

## 2021-10-14 NOTE — ED Notes (Signed)
Patient is being discharged from the Urgent Care and sent to the Emergency Department via personal vehicle with family . Per Provider Verna Czech, patient is in need of higher level of care due to respiratory distress. Patient is aware and verbalizes understanding of plan of care.  Vitals:   10/14/21 2009  BP: (!) 176/90  Pulse: (!) 105  Resp: 20  Temp: 98.1 F (36.7 C)  SpO2: (!) 87%

## 2021-10-14 NOTE — ED Provider Notes (Signed)
Sent to ED   Hazel Sams, PA-C 10/14/21 2029

## 2021-10-14 NOTE — ED Triage Notes (Signed)
Pt reports with cough, congestion, shortness of breath, sore throat, and headache x 2 weeks.

## 2021-10-14 NOTE — Discharge Instructions (Addendum)
-  Sent to ED via triage by Junie Panning Rasput PA

## 2021-10-15 DIAGNOSIS — I5041 Acute combined systolic (congestive) and diastolic (congestive) heart failure: Secondary | ICD-10-CM | POA: Diagnosis present

## 2021-10-15 DIAGNOSIS — I517 Cardiomegaly: Secondary | ICD-10-CM | POA: Diagnosis not present

## 2021-10-15 DIAGNOSIS — U071 COVID-19: Secondary | ICD-10-CM | POA: Diagnosis present

## 2021-10-15 DIAGNOSIS — I350 Nonrheumatic aortic (valve) stenosis: Secondary | ICD-10-CM | POA: Diagnosis not present

## 2021-10-15 DIAGNOSIS — Z888 Allergy status to other drugs, medicaments and biological substances status: Secondary | ICD-10-CM | POA: Diagnosis not present

## 2021-10-15 DIAGNOSIS — I272 Pulmonary hypertension, unspecified: Secondary | ICD-10-CM | POA: Diagnosis present

## 2021-10-15 DIAGNOSIS — E876 Hypokalemia: Secondary | ICD-10-CM | POA: Diagnosis present

## 2021-10-15 DIAGNOSIS — Z96653 Presence of artificial knee joint, bilateral: Secondary | ICD-10-CM | POA: Diagnosis present

## 2021-10-15 DIAGNOSIS — E785 Hyperlipidemia, unspecified: Secondary | ICD-10-CM | POA: Diagnosis present

## 2021-10-15 DIAGNOSIS — I5021 Acute systolic (congestive) heart failure: Secondary | ICD-10-CM | POA: Diagnosis not present

## 2021-10-15 DIAGNOSIS — Z6839 Body mass index (BMI) 39.0-39.9, adult: Secondary | ICD-10-CM | POA: Diagnosis not present

## 2021-10-15 DIAGNOSIS — I2510 Atherosclerotic heart disease of native coronary artery without angina pectoris: Secondary | ICD-10-CM | POA: Diagnosis present

## 2021-10-15 DIAGNOSIS — I509 Heart failure, unspecified: Secondary | ICD-10-CM | POA: Diagnosis present

## 2021-10-15 DIAGNOSIS — I110 Hypertensive heart disease with heart failure: Secondary | ICD-10-CM | POA: Diagnosis present

## 2021-10-15 DIAGNOSIS — K219 Gastro-esophageal reflux disease without esophagitis: Secondary | ICD-10-CM | POA: Diagnosis present

## 2021-10-15 DIAGNOSIS — E119 Type 2 diabetes mellitus without complications: Secondary | ICD-10-CM | POA: Diagnosis present

## 2021-10-15 DIAGNOSIS — E878 Other disorders of electrolyte and fluid balance, not elsewhere classified: Secondary | ICD-10-CM | POA: Diagnosis present

## 2021-10-15 DIAGNOSIS — Z885 Allergy status to narcotic agent status: Secondary | ICD-10-CM | POA: Diagnosis not present

## 2021-10-15 DIAGNOSIS — E871 Hypo-osmolality and hyponatremia: Secondary | ICD-10-CM | POA: Diagnosis present

## 2021-10-15 DIAGNOSIS — I4510 Unspecified right bundle-branch block: Secondary | ICD-10-CM | POA: Diagnosis present

## 2021-10-15 DIAGNOSIS — Z79899 Other long term (current) drug therapy: Secondary | ICD-10-CM | POA: Diagnosis not present

## 2021-10-15 DIAGNOSIS — Z7983 Long term (current) use of bisphosphonates: Secondary | ICD-10-CM | POA: Diagnosis not present

## 2021-10-15 DIAGNOSIS — R531 Weakness: Secondary | ICD-10-CM | POA: Diagnosis present

## 2021-10-15 DIAGNOSIS — L03116 Cellulitis of left lower limb: Secondary | ICD-10-CM | POA: Diagnosis present

## 2021-10-15 LAB — COMPREHENSIVE METABOLIC PANEL
ALT: 24 U/L (ref 0–44)
AST: 24 U/L (ref 15–41)
Albumin: 3.8 g/dL (ref 3.5–5.0)
Alkaline Phosphatase: 55 U/L (ref 38–126)
Anion gap: 12 (ref 5–15)
BUN: 17 mg/dL (ref 8–23)
CO2: 25 mmol/L (ref 22–32)
Calcium: 8.6 mg/dL — ABNORMAL LOW (ref 8.9–10.3)
Chloride: 90 mmol/L — ABNORMAL LOW (ref 98–111)
Creatinine, Ser: 0.56 mg/dL (ref 0.44–1.00)
GFR, Estimated: >60 mL/min (ref >60)
Glucose, Bld: 118 mg/dL — ABNORMAL HIGH (ref 70–99)
Potassium: 3.2 mmol/L — ABNORMAL LOW (ref 3.5–5.1)
Sodium: 127 mmol/L — ABNORMAL LOW (ref 135–145)
Total Bilirubin: 0.9 mg/dL (ref 0.3–1.2)
Total Protein: 8 g/dL (ref 6.5–8.1)

## 2021-10-15 LAB — CBC WITH DIFFERENTIAL/PLATELET
Abs Immature Granulocytes: 0.11 10*3/uL — ABNORMAL HIGH (ref 0.00–0.07)
Basophils Absolute: 0 10*3/uL (ref 0.0–0.1)
Basophils Relative: 0 %
Eosinophils Absolute: 0 10*3/uL (ref 0.0–0.5)
Eosinophils Relative: 0 %
HCT: 39.8 % (ref 36.0–46.0)
Hemoglobin: 13.8 g/dL (ref 12.0–15.0)
Immature Granulocytes: 1 %
Lymphocytes Relative: 20 %
Lymphs Abs: 2.7 10*3/uL (ref 0.7–4.0)
MCH: 32.1 pg (ref 26.0–34.0)
MCHC: 34.7 g/dL (ref 30.0–36.0)
MCV: 92.6 fL (ref 80.0–100.0)
Monocytes Absolute: 1.4 10*3/uL — ABNORMAL HIGH (ref 0.1–1.0)
Monocytes Relative: 11 %
Neutro Abs: 9.2 10*3/uL — ABNORMAL HIGH (ref 1.7–7.7)
Neutrophils Relative %: 68 %
Platelets: 345 10*3/uL (ref 150–400)
RBC: 4.3 MIL/uL (ref 3.87–5.11)
RDW: 14.7 % (ref 11.5–15.5)
WBC: 13.5 10*3/uL — ABNORMAL HIGH (ref 4.0–10.5)
nRBC: 0 % (ref 0.0–0.2)

## 2021-10-15 LAB — RENAL FUNCTION PANEL
Albumin: 3.8 g/dL (ref 3.5–5.0)
Anion gap: 12 (ref 5–15)
BUN: 17 mg/dL (ref 8–23)
CO2: 26 mmol/L (ref 22–32)
Calcium: 8.4 mg/dL — ABNORMAL LOW (ref 8.9–10.3)
Chloride: 92 mmol/L — ABNORMAL LOW (ref 98–111)
Creatinine, Ser: 0.55 mg/dL (ref 0.44–1.00)
GFR, Estimated: >60 mL/min
Glucose, Bld: 101 mg/dL — ABNORMAL HIGH (ref 70–99)
Phosphorus: 3.4 mg/dL (ref 2.5–4.6)
Potassium: 3.2 mmol/L — ABNORMAL LOW (ref 3.5–5.1)
Sodium: 130 mmol/L — ABNORMAL LOW (ref 135–145)

## 2021-10-15 LAB — CBC
HCT: 40.9 % (ref 36.0–46.0)
Hemoglobin: 14.1 g/dL (ref 12.0–15.0)
MCH: 31.9 pg (ref 26.0–34.0)
MCHC: 34.5 g/dL (ref 30.0–36.0)
MCV: 92.5 fL (ref 80.0–100.0)
Platelets: 324 10*3/uL (ref 150–400)
RBC: 4.42 MIL/uL (ref 3.87–5.11)
RDW: 14.6 % (ref 11.5–15.5)
WBC: 12.6 10*3/uL — ABNORMAL HIGH (ref 4.0–10.5)
nRBC: 0 % (ref 0.0–0.2)

## 2021-10-15 LAB — MAGNESIUM: Magnesium: 2.2 mg/dL (ref 1.7–2.4)

## 2021-10-15 LAB — CREATININE, SERUM
Creatinine, Ser: 0.56 mg/dL (ref 0.44–1.00)
GFR, Estimated: >60 mL/min (ref >60)

## 2021-10-15 LAB — RESP PANEL BY RT-PCR (FLU A&B, COVID) ARPGX2
Influenza A by PCR: NEGATIVE
Influenza B by PCR: NEGATIVE
SARS Coronavirus 2 by RT PCR: POSITIVE — AB

## 2021-10-15 LAB — TROPONIN I (HIGH SENSITIVITY)
Troponin I (High Sensitivity): 23 ng/L — ABNORMAL HIGH (ref <18)
Troponin I (High Sensitivity): 21 ng/L — ABNORMAL HIGH (ref <18)

## 2021-10-15 LAB — BRAIN NATRIURETIC PEPTIDE: B Natriuretic Peptide: 991.7 pg/mL — ABNORMAL HIGH (ref 0.0–100.0)

## 2021-10-15 LAB — D-DIMER, QUANTITATIVE: D-Dimer, Quant: 0.86 ug/mL-FEU — ABNORMAL HIGH (ref 0.00–0.50)

## 2021-10-15 MED ORDER — GUAIFENESIN ER 600 MG PO TB12
ORAL_TABLET | ORAL | Status: AC
  Administered 2021-10-15: 600 mg via ORAL
  Filled 2021-10-15: qty 1

## 2021-10-15 MED ORDER — IPRATROPIUM-ALBUTEROL 20-100 MCG/ACT IN AERS: 1.0000 | INHALATION_SPRAY | Freq: Four times a day (QID) | RESPIRATORY_TRACT | Status: DC | PRN

## 2021-10-15 MED ORDER — POTASSIUM CHLORIDE CRYS ER 20 MEQ PO TBCR: 40.0000 meq | EXTENDED_RELEASE_TABLET | Freq: Every day | ORAL | Status: DC

## 2021-10-15 MED ORDER — LIP MEDEX EX OINT
1.0000 "application " | TOPICAL_OINTMENT | CUTANEOUS | Status: DC | PRN
  Filled 2021-10-15: qty 7

## 2021-10-15 MED ORDER — POTASSIUM CHLORIDE CRYS ER 20 MEQ PO TBCR
EXTENDED_RELEASE_TABLET | ORAL | Status: AC
  Filled 2021-10-15: qty 2

## 2021-10-15 MED ORDER — FUROSEMIDE 10 MG/ML IJ SOLN
40.0000 mg | Freq: Once | INTRAMUSCULAR | Status: AC
  Administered 2021-10-15: 40 mg via INTRAVENOUS

## 2021-10-15 MED ORDER — GUAIFENESIN ER 600 MG PO TB12
600.0000 mg | ORAL_TABLET | Freq: Two times a day (BID) | ORAL | Status: DC
  Administered 2021-10-16 – 2021-10-21 (×11): 600 mg via ORAL
  Filled 2021-10-15 (×11): qty 1

## 2021-10-15 MED ORDER — CEFAZOLIN SODIUM-DEXTROSE 1-4 GM/50ML-% IV SOLN
1.0000 g | Freq: Three times a day (TID) | INTRAVENOUS | Status: DC
  Administered 2021-10-15 – 2021-10-16 (×2): 1 g via INTRAVENOUS
  Filled 2021-10-15 (×3): qty 50

## 2021-10-15 MED ORDER — ACETAMINOPHEN 325 MG PO TABS
650.0000 mg | ORAL_TABLET | Freq: Four times a day (QID) | ORAL | Status: DC | PRN
  Administered 2021-10-16 – 2021-10-22 (×15): 650 mg via ORAL
  Filled 2021-10-15 (×16): qty 2

## 2021-10-15 MED ORDER — ACETAMINOPHEN 650 MG RE SUPP: 650.0000 mg | Freq: Four times a day (QID) | RECTAL | Status: DC | PRN

## 2021-10-15 MED ORDER — POLYVINYL ALCOHOL 1.4 % OP SOLN
1.0000 [drp] | OPHTHALMIC | Status: DC | PRN
  Administered 2021-10-16 – 2021-10-21 (×5): 1 [drp] via OPHTHALMIC
  Filled 2021-10-15: qty 15

## 2021-10-15 MED ORDER — POTASSIUM CHLORIDE CRYS ER 20 MEQ PO TBCR: 40.0000 meq | EXTENDED_RELEASE_TABLET | Freq: Once | ORAL | Status: DC

## 2021-10-15 MED ORDER — GUAIFENESIN-DM 100-10 MG/5ML PO SYRP
5.0000 mL | ORAL_SOLUTION | ORAL | Status: DC | PRN
  Administered 2021-10-19 – 2021-10-23 (×9): 5 mL via ORAL
  Filled 2021-10-15 (×2): qty 10
  Filled 2021-10-15: qty 5
  Filled 2021-10-15 (×3): qty 10
  Filled 2021-10-15 (×3): qty 5

## 2021-10-15 MED ORDER — FUROSEMIDE 10 MG/ML IJ SOLN
INTRAMUSCULAR | Status: AC
  Filled 2021-10-15: qty 4

## 2021-10-15 MED ORDER — ENOXAPARIN SODIUM 40 MG/0.4ML IJ SOSY
40.0000 mg | PREFILLED_SYRINGE | INTRAMUSCULAR | Status: DC
  Administered 2021-10-16 – 2021-10-21 (×6): 40 mg via SUBCUTANEOUS
  Filled 2021-10-15 (×6): qty 0.4

## 2021-10-15 MED ORDER — ENOXAPARIN SODIUM 40 MG/0.4ML IJ SOSY
PREFILLED_SYRINGE | INTRAMUSCULAR | Status: AC
  Administered 2021-10-15: 40 mg via SUBCUTANEOUS
  Filled 2021-10-15: qty 0.4

## 2021-10-15 MED ORDER — FUROSEMIDE 10 MG/ML IJ SOLN
40.0000 mg | Freq: Two times a day (BID) | INTRAMUSCULAR | Status: DC
  Administered 2021-10-16 – 2021-10-18 (×5): 40 mg via INTRAVENOUS
  Filled 2021-10-15 (×5): qty 4

## 2021-10-15 NOTE — ED Provider Notes (Signed)
[facility] DEPT Provider Note   CSN: [id number] Arrival date & time: 10/14/21  2050     History Chief Complaint  Patient presents with   Shortness of Breath   Nasal Congestion   Headache   Sore Throat    Amy [name] is a 75 y.o. female.  The history is provided by the patient, a relative and medical records.  Shortness of Breath Associated symptoms: headaches   Headache Sore Throat Associated symptoms include headaches and shortness of breath.  Amy [name] is a 75 y.o. female who presents to the Emergency Department complaining of shortness of breath. She started feeling poorly about three weeks ago with shortness of breath, fatigue, poor appetite. Two weeks ago she had a fever, which has since resolved. No associated chest pain. She does report cough productive of green sputum. No lower extremity edema. About one week ago her symptoms significantly worsened. She went to urgent care and was found to be hypoxic and referred to the emergency department for further evaluation. She has no significant medical problems and her only medication is a statin. No history of DVT/PE. She lives with her daughter.      History reviewed. No pertinent past medical history. prediabetes There are no problems to display for this patient.   Past Surgical History:  Procedure Laterality Date   CARPAL TUNNEL RELEASE Bilateral    FEMUR FRACTURE SURGERY     REPLACEMENT TOTAL KNEE BILATERAL     SHOULDER SURGERY Right    rotator cuff     OB History   No obstetric history on file.     History reviewed. No pertinent family history.  Social History   Tobacco Use   Smoking status: Never   Smokeless tobacco: Never  Substance Use Topics   Alcohol use: No   Drug use: No    Home Medications Prior to Admission medications   Medication Sig Start Date End Date Taking? Authorizing Provider  acetaminophen (TYLENOL) 500 MG tablet Take 500 mg by mouth every 6  (six) hours as needed for mild pain or headache.    [provider]  alendronate (FOSAMAX) 70 MG tablet Take 1 tablet by mouth once a week. On Sundays 01/25/14   [provider]  gabapentin (NEURONTIN) 300 MG capsule Take 1 capsule by mouth 3 (three) times daily. 01/16/14   [provider]  lovastatin (MEVACOR) 40 MG tablet Take 1 tablet by mouth daily. 01/16/14   [provider]  methocarbamol (ROBAXIN) 750 MG tablet Take 1 tablet (750 mg total) by mouth 4 (four) times daily. 01/30/14   Linton Flemings, MD  ranitidine (ZANTAC) 300 MG tablet Take 1 tablet by mouth 2 (two) times daily as needed for heartburn.  01/16/14   [provider]  traMADol (ULTRAM) 50 MG tablet Take 1 tablet (50 mg total) by mouth every 6 (six) hours as needed for moderate pain. 01/30/14   Linton Flemings, MD    Allergies    Morphine and related  Review of Systems   Review of Systems  Respiratory:  Positive for shortness of breath.   Neurological:  Positive for headaches.  All other systems reviewed and are negative.  Physical Exam Updated Vital Signs BP (!) 159/88   Pulse (!) 102   Temp 98.2 F (36.8 C) (Oral)   Resp (!) 23   SpO2 99%   Physical Exam Vitals and nursing note reviewed.  Constitutional:      Appearance: She is well-developed.  HENT:     Head: Normocephalic and atraumatic.  Cardiovascular:     Rate and Rhythm: Regular rhythm. Tachycardia present.     Heart sounds: Murmur heard.  Pulmonary:     Effort: Pulmonary effort is normal. No respiratory distress.     Comments: Crackles in bilateral bases. Tachypnea. Abdominal:     Palpations: Abdomen is soft.     Tenderness: There is no abdominal tenderness. There is no guarding or rebound.  Musculoskeletal:        General: No tenderness.     Comments: Trace pitting edema to BLE  Skin:    General: Skin is warm and dry.  Neurological:     Mental Status: She is alert and oriented to person, place, and time.   Psychiatric:        Behavior: Behavior normal.    ED Results / Procedures / Treatments   Labs (all labs ordered are listed, but only abnormal results are displayed) Labs Reviewed  RESP PANEL BY RT-PCR (FLU A&B, COVID) ARPGX2 - Abnormal; Notable for the following components:      Result Value   SARS Coronavirus 2 by RT PCR POSITIVE (*)    All other components within normal limits  CBC WITH DIFFERENTIAL/PLATELET - Abnormal; Notable for the following components:   WBC 13.5 (*)    Neutro Abs 9.2 (*)    Monocytes Absolute 1.4 (*)    Abs Immature Granulocytes 0.11 (*)    All other components within normal limits  D-DIMER, QUANTITATIVE - Abnormal; Notable for the following components:   D-Dimer, Quant 0.86 (*)    All other components within normal limits  COMPREHENSIVE METABOLIC PANEL  BRAIN NATRIURETIC PEPTIDE  TROPONIN I (HIGH SENSITIVITY)  TROPONIN I (HIGH SENSITIVITY)    EKG EKG Interpretation  Date/Time:  Monday October 14 2021 23:31:37 EST Ventricular Rate:  83 PR Interval:  136 QRS Duration: 140 QT Interval:  402 QTC Calculation: 472 R Axis:   -10 Text Interpretation: Normal sinus rhythm with sinus arrhythmia Right bundle branch block Abnormal ECG Confirmed by Quintella Reichert [phone]) on 10/15/2021 5:33:45 AM  Radiology DG Chest 2 View  Result Date: 10/14/2021 CLINICAL DATA:  Shortness of breath. EXAM: CHEST - 2 VIEW COMPARISON:  Chest x-ray 10/21/2009 FINDINGS: Heart is enlarged, unchanged. There is central pulmonary vascular congestion. Minimal strandy opacities in the lung bases likely represent atelectasis. There is no focal lung consolidation, pleural effusion or pneumothorax. There are surgical changes in the right shoulder. No acute fractures. IMPRESSION: 1. Cardiomegaly with central pulmonary vascular congestion. Electronically Signed   By: Ronney Asters M.D.   On: 10/14/2021 22:36    Procedures Procedures   Medications Ordered in ED Medications - No data to  display  ED Course  I have reviewed the triage vital signs and the nursing notes.  Pertinent labs & imaging results that were available during my care of the patient were reviewed by me and considered in my medical decision making (see chart for details).    MDM Rules/Calculators/A&P                          patient here for evaluation of three weeks of cough and shortness of breath. She has a murmur, crackles on examination with lower extremity edema. Chest x-ray with pulmonary vascular congestion. She is positive for COVID-19 today. Examination and imaging is concerning for acute CHF. Suspect that she developed COVID infection 2 to 3 weeks ago at  the initiation of her cough. Patient care transferred pending renal function.  Final Clinical Impression(s) / ED Diagnoses Final diagnoses:  None    Rx / DC Orders ED Discharge Orders     None        Quintella Reichert, MD 10/15/21 [phone]

## 2021-10-15 NOTE — H&P (Signed)
History and Physical    Amy [name] HCW:[id number] DOB: 09-05-1946 DOA: 10/14/2021  PCP: Shirline Frees, MD  Patient coming from: Home  Chief Complaint: shortness of breath  HPI: Amy [name] is a 75 y.o. female with medical history significant of HLD. Presenting with dyspnea. She noted about 3 weeks ago or so she was having shortness of breath and cough. She tried some OTC cough meds and APAP because she developed a fever. Her fever went away, but her cough did not. She continued to have dyspnea over that time and she became more tired. Her symptoms seemed to worsen over the last few days. She finally decided to go to urgent care. When she was evaluated there, they recommended that she come to the ED. She denies any other aggravating or alleviating factors.   ED Course: She's COVID positive. CXR positive for cardiomegaly and pulmonary vascular congestion. BNP was elevated at 991. She was given lasix. TRH was called for admission.   Review of Systems:  Denies CP, palpitations, N/V/D, sick contacts. Review of systems is otherwise negative for all not mentioned in HPI.   PMHx History reviewed. No pertinent past medical history.  PSHx Past Surgical History:  Procedure Laterality Date   CARPAL TUNNEL RELEASE Bilateral    FEMUR FRACTURE SURGERY     REPLACEMENT TOTAL KNEE BILATERAL     SHOULDER SURGERY Right    rotator cuff    SocHx  reports that she has never smoked. She has never used smokeless tobacco. She reports that she does not drink alcohol and does not use drugs.  Allergies  Allergen Reactions   Morphine And Related Nausea And Vomiting    FamHx History reviewed. No pertinent family history.  Prior to Admission medications   Medication Sig Start Date End Date Taking? Authorizing Provider  acetaminophen (TYLENOL) 500 MG tablet Take 500 mg by mouth every 6 (six) hours as needed for mild pain or headache.    [provider]  alendronate (FOSAMAX) 70 MG tablet  Take 1 tablet by mouth once a week. On Sundays 01/25/14   [provider]  gabapentin (NEURONTIN) 300 MG capsule Take 1 capsule by mouth 3 (three) times daily. 01/16/14   [provider]  lovastatin (MEVACOR) 40 MG tablet Take 1 tablet by mouth daily. 01/16/14   [provider]  methocarbamol (ROBAXIN) 750 MG tablet Take 1 tablet (750 mg total) by mouth 4 (four) times daily. 01/30/14   Linton Flemings, MD  ranitidine (ZANTAC) 300 MG tablet Take 1 tablet by mouth 2 (two) times daily as needed for heartburn.  01/16/14   [provider]  traMADol (ULTRAM) 50 MG tablet Take 1 tablet (50 mg total) by mouth every 6 (six) hours as needed for moderate pain. 01/30/14   Linton Flemings, MD    Physical Exam: Vitals:   10/14/21 2325 10/14/21 2325 10/15/21 0630  BP:  (!) 162/100 (!) 159/88  Pulse:  97 (!) 102  Resp:  15 (!) 23  Temp:  98.2 F (36.8 C)   TempSrc:  Oral   SpO2: 93% 93% 99%    General: 75 y.o. female resting in bed in NAD Eyes: PERRL, normal sclera ENMT: Nares patent w/o discharge, orophaynx clear, dentition normal, ears w/o discharge/lesions/ulcers Neck: Supple, trachea midline Cardiovascular: RRR, +S1, S2, no m/g/r, equal pulses throughout Respiratory: decreased at bases, no r/r/w, slightly increase WOB on 2L [place] GI: BS+, NDNT, no masses noted, no organomegaly noted MSK: No c/c; LLE erythema Skin:  No rashes, bruises, ulcerations noted Neuro: A&O x 3, no focal deficits Psyc: Appropriate interaction and affect, calm/cooperative  Labs on Admission: I have personally reviewed following labs and imaging studies  CBC: Recent Labs  Lab 10/15/21 0629  WBC 13.5*  NEUTROABS 9.2*  HGB 13.8  HCT 39.8  MCV 92.6  PLT 621   Basic Metabolic Panel: Recent Labs  Lab 10/15/21 0629  NA 127*  K 3.2*  CL 90*  CO2 25  GLUCOSE 118*  BUN 17  CREATININE 0.56  CALCIUM 8.6*   GFR: CrCl cannot be calculated (Unknown ideal weight.). Liver Function Tests: Recent  Labs  Lab 10/15/21 0629  AST 24  ALT 24  ALKPHOS 55  BILITOT 0.9  PROT 8.0  ALBUMIN 3.8   No results for input(s): LIPASE, AMYLASE in the last 168 hours. No results for input(s): AMMONIA in the last 168 hours. Coagulation Profile: No results for input(s): INR, PROTIME in the last 168 hours. Cardiac Enzymes: No results for input(s): CKTOTAL, CKMB, CKMBINDEX, TROPONINI in the last 168 hours. BNP (last 3 results) No results for input(s): PROBNP in the last 8760 hours. HbA1C: No results for input(s): HGBA1C in the last 72 hours. CBG: No results for input(s): GLUCAP in the last 168 hours. Lipid Profile: No results for input(s): CHOL, HDL, LDLCALC, TRIG, CHOLHDL, LDLDIRECT in the last 72 hours. Thyroid Function Tests: No results for input(s): TSH, T4TOTAL, FREET4, T3FREE, THYROIDAB in the last 72 hours. Anemia Panel: No results for input(s): VITAMINB12, FOLATE, FERRITIN, TIBC, IRON, RETICCTPCT in the last 72 hours. Urine analysis:    Component Value Date/Time   COLORURINE YELLOW 10/21/2009 1813   APPEARANCEUR CLEAR 10/21/2009 1813   LABSPEC 1.016 10/21/2009 1813   PHURINE 7.5 10/21/2009 1813   GLUCOSEU NEGATIVE 10/21/2009 1813   HGBUR NEGATIVE 10/21/2009 1813   BILIRUBINUR NEGATIVE 10/21/2009 1813   KETONESUR NEGATIVE 10/21/2009 1813   PROTEINUR NEGATIVE 10/21/2009 1813   UROBILINOGEN 0.2 10/21/2009 1813   NITRITE NEGATIVE 10/21/2009 1813   LEUKOCYTESUR  10/21/2009 1813    NEGATIVE MICROSCOPIC NOT DONE ON URINES WITH NEGATIVE PROTEIN, BLOOD, LEUKOCYTES, NITRITE, OR GLUCOSE <1000 mg/dL.    Radiological Exams on Admission: DG Chest 2 View  Result Date: 10/14/2021 CLINICAL DATA:  Shortness of breath. EXAM: CHEST - 2 VIEW COMPARISON:  Chest x-ray 10/21/2009 FINDINGS: Heart is enlarged, unchanged. There is central pulmonary vascular congestion. Minimal strandy opacities in the lung bases likely represent atelectasis. There is no focal lung consolidation, pleural effusion or  pneumothorax. There are surgical changes in the right shoulder. No acute fractures. IMPRESSION: 1. Cardiomegaly with central pulmonary vascular congestion. Electronically Signed   By: Ronney Asters M.D.   On: 10/14/2021 22:36    EKG: Independently reviewed. Sinus, rbbb  Assessment/Plan Acute CHF, unknown type Elevated trp     - admit to inpt, tele     - continue lasix (40mg  IV BID)     - fluid restriction to 1200cc     - watch I&O, daily wt     - echo     - trp mildly elevated but flat, no CP; follow for now  COVID 19+     - symptoms started 3 weeks ago, no PNA on CXR     - symptomatic care at this point  HLD     - continue statin  LLE venous statis vs cellulitis     - non-purulent     - can start cefazolin; monitor  Hyponatremia Hypokalemia     -  getting lasix, fluid restriction     - monitor Na+; no more than 8pt rise per 24hrs     - replace K+; check Mg2+  DVT prophylaxis: lovenox  Code Status: FULL  Family Communication: None at bedside  Consults called: None  Status is: Inpatient  Remains inpatient appropriate because: severity  Tal Kempker A Addysin Porco DO [facility]  If 7PM-7AM, please contact night-coverage [email or web address]  10/15/2021, 10:10 AM

## 2021-10-15 NOTE — ED Provider Notes (Signed)
Patient was initially seen by Dr. Ayesha Rumpf.  Please see her note.  Patient was noted to have findings consistent with CHF exacerbation and also found to have covid.   Will consult medical service for admission.     Dorie Rank, MD 10/15/21 [phone]

## 2021-10-16 ENCOUNTER — Other Ambulatory Visit (HOSPITAL_COMMUNITY): Payer: Medicare HMO

## 2021-10-16 DIAGNOSIS — I509 Heart failure, unspecified: Secondary | ICD-10-CM | POA: Diagnosis not present

## 2021-10-16 LAB — RENAL FUNCTION PANEL
Albumin: 3.3 g/dL — ABNORMAL LOW (ref 3.5–5.0)
Anion gap: 7 (ref 5–15)
BUN: 25 mg/dL — ABNORMAL HIGH (ref 8–23)
CO2: 26 mmol/L (ref 22–32)
Calcium: 7.9 mg/dL — ABNORMAL LOW (ref 8.9–10.3)
Chloride: 92 mmol/L — ABNORMAL LOW (ref 98–111)
Creatinine, Ser: 0.58 mg/dL (ref 0.44–1.00)
GFR, Estimated: >60 mL/min (ref >60)
Glucose, Bld: 97 mg/dL (ref 70–99)
Phosphorus: 4 mg/dL (ref 2.5–4.6)
Potassium: 3 mmol/L — ABNORMAL LOW (ref 3.5–5.1)
Sodium: 125 mmol/L — ABNORMAL LOW (ref 135–145)

## 2021-10-16 LAB — CBC
HCT: 37.4 % (ref 36.0–46.0)
Hemoglobin: 12.7 g/dL (ref 12.0–15.0)
MCH: 32.3 pg (ref 26.0–34.0)
MCHC: 34 g/dL (ref 30.0–36.0)
MCV: 95.2 fL (ref 80.0–100.0)
Platelets: 302 10*3/uL (ref 150–400)
RBC: 3.93 MIL/uL (ref 3.87–5.11)
RDW: 14.5 % (ref 11.5–15.5)
WBC: 11.1 10*3/uL — ABNORMAL HIGH (ref 4.0–10.5)
nRBC: 0 % (ref 0.0–0.2)

## 2021-10-16 LAB — SODIUM, URINE, RANDOM: Sodium, Ur: 49 mmol/L

## 2021-10-16 LAB — OSMOLALITY: Osmolality: 279 mOsm/kg (ref 275–295)

## 2021-10-16 LAB — CORTISOL: Cortisol, Plasma: 16.5 ug/dL

## 2021-10-16 LAB — COMPREHENSIVE METABOLIC PANEL
ALT: 24 U/L (ref 0–44)
AST: 21 U/L (ref 15–41)
Albumin: 3.3 g/dL — ABNORMAL LOW (ref 3.5–5.0)
Alkaline Phosphatase: 51 U/L (ref 38–126)
Anion gap: 8 (ref 5–15)
BUN: 25 mg/dL — ABNORMAL HIGH (ref 8–23)
CO2: 26 mmol/L (ref 22–32)
Calcium: 8 mg/dL — ABNORMAL LOW (ref 8.9–10.3)
Chloride: 92 mmol/L — ABNORMAL LOW (ref 98–111)
Creatinine, Ser: 0.58 mg/dL (ref 0.44–1.00)
GFR, Estimated: >60 mL/min (ref >60)
Glucose, Bld: 97 mg/dL (ref 70–99)
Potassium: 3 mmol/L — ABNORMAL LOW (ref 3.5–5.1)
Sodium: 126 mmol/L — ABNORMAL LOW (ref 135–145)
Total Bilirubin: 0.9 mg/dL (ref 0.3–1.2)
Total Protein: 6.7 g/dL (ref 6.5–8.1)

## 2021-10-16 LAB — OSMOLALITY, URINE: Osmolality, Ur: 419 mOsm/kg (ref 300–900)

## 2021-10-16 LAB — HEMOGLOBIN A1C
Hgb A1c MFr Bld: 6.3 % — ABNORMAL HIGH (ref 4.8–5.6)
Mean Plasma Glucose: 134 mg/dL

## 2021-10-16 LAB — TSH: TSH: 0.829 u[IU]/mL (ref 0.350–4.500)

## 2021-10-16 LAB — URIC ACID: Uric Acid, Serum: 4.1 mg/dL (ref 2.5–7.1)

## 2021-10-16 MED ORDER — CEPHALEXIN 500 MG PO CAPS
500.0000 mg | ORAL_CAPSULE | Freq: Three times a day (TID) | ORAL | Status: AC
  Administered 2021-10-16 – 2021-10-20 (×12): 500 mg via ORAL
  Filled 2021-10-16 (×12): qty 1

## 2021-10-16 MED ORDER — POTASSIUM CHLORIDE CRYS ER 20 MEQ PO TBCR
40.0000 meq | EXTENDED_RELEASE_TABLET | Freq: Every day | ORAL | Status: DC
  Administered 2021-10-17 – 2021-10-18 (×2): 40 meq via ORAL
  Filled 2021-10-16 (×2): qty 2

## 2021-10-16 MED ORDER — POTASSIUM CHLORIDE CRYS ER 20 MEQ PO TBCR
60.0000 meq | EXTENDED_RELEASE_TABLET | Freq: Once | ORAL | Status: AC
  Administered 2021-10-16: 60 meq via ORAL
  Filled 2021-10-16: qty 3

## 2021-10-16 NOTE — TOC Progression Note (Signed)
Transition of Care [facility]) - Progression Note    Patient Details  Name: LATICE WAITMAN MRN: [id number] Date of Birth: 1945/12/26  [facility] CM/SW Contact  Purcell Mouton, RN Phone Number: 10/16/2021, 10:43 AM  Clinical Narrative:    Pt from home with her daughter. TOC reviewed chart and will continue to follow for discharge needs.   Expected Discharge Plan: [place] Barriers to Discharge: No Barriers Identified  Expected Discharge Plan and Services Expected Discharge Plan: [place] arrangements for the past 2 months: Single Family Home                                       Social Determinants of Health (SDOH) Interventions    Readmission Risk Interventions No flowsheet data found.

## 2021-10-16 NOTE — Progress Notes (Addendum)
PROGRESS NOTE    Amy [name]  PYP:[id number] DOB: 1946/07/26 DOA: 10/14/2021 PCP: Shirline Frees, MD   Chief Complain: Shortness of breath  Brief Narrative: Patient is a 75 year old female with history of hyperlipidemia who presented with shortness of breath that started about 3 weeks ago.  She was also complaining of cough.  Cough did not improve with outpatient medication.  She was seen in the urgent care and sent to the emergency department.  COVID screen test, today was diabetes is elevated for cardiomegaly, pulmonary vascular congestion.  Labs showed elevated BNP.  Patient was admitted for the management of suspected acute congestive heart failure.  Currently on IV Lasix.  Echocardiogram pending.  Assessment & Plan:   Principal Problem:   Acute CHF (congestive heart failure) (HCC)   Acute congestive heart failure: No history of CHF.  Presented with cough ,elevated BNP.  Chest x-ray suggestive of CHF.  Started on Lasix 40 mg twice daily.  Echo pending.  Continue to monitor daily weight, input/output. Has bibasilar crackles but no peripheral edema.  We will continue the Lasix at current dose.  COVID-19: Symptoms started about 3 weeks ago.  No pneumonia seen on the chest x-ray.  Currently not hypoxic.  No acute indication for treatment.  Continue supportive care  Hyponatremia: Sodium in the range of 125 on presentation.  Possible hypervolemic hyponatremia from CHF.  Continue monitor.  We will get SIADH panel.  Hyperlipidemia: On statin  Suspected left lower extremity cellulitis versus venous stasis: Started on cefazolin,will change to keflex  Hypokalemia: Currently being supplemented, monitor.  Morbid obesity: BMI of 39.6        DVT prophylaxis: Lovenox Code Status: Full code Family Communication: None at bedside Patient status:  Dispo: The patient is from: Home               Anticipated d/c is to: Home              Anticipated d/c date is: 1-2 days  Consultants:  None  Procedures:None  Antimicrobials:  Anti-infectives (From admission, onward)    Start     Dose/Rate Route Frequency Ordered Stop   10/15/21 2200  ceFAZolin (ANCEF) IVPB 1 g/50 mL premix        1 g 100 mL/hr over 30 Minutes Intravenous Every 8 hours 10/15/21 2040 10/22/21 2159       Subjective: Patient seen and examined the bedside this morning.  Hemodynamically stable.  Comfortable.  Denies any shortness of breath or cough.  Lower extremity swelling has improved.  Tolerating room air. Objective: Vitals:   10/16/21 0127 10/16/21 0500 10/16/21 0531 10/16/21 0547  BP: 117/66  138/82 (!) 142/70  Pulse: 98  99 95  Resp: 18  18 18   Temp: 98.8 F (37.1 C)  98.3 F (36.8 C) 98.4 F (36.9 C)  TempSrc: Oral  Oral Oral  SpO2: 99%  99% 97%  Weight:  86 kg    Height:        Intake/Output Summary (Last 24 hours) at 10/16/2021 0809 Last data filed at 10/16/2021 2458 Gross per 24 hour  Intake --  Output 300 ml  Net -300 ml   Filed Weights   10/15/21 2046 10/16/21 0500  Weight: 85.3 kg 86 kg    Examination:  General exam: Overall comfortable, not in distress, morbidly obese HEENT: PERRL Respiratory system:  Bibasilar crackles  Cardiovascular system: S1 & S2 heard, RRR.  Gastrointestinal system: Abdomen is nondistended, soft and nontender. Central nervous system:  Alert and oriented Extremities: No edema, no clubbing ,no cyanosis Skin: Erythema on the left lower extremity, no ulcers,no icterus      Data Reviewed: I have personally reviewed following labs and imaging studies  CBC: Recent Labs  Lab 10/15/21 0629 10/15/21 2125 10/16/21 0421  WBC 13.5* 12.6* 11.1*  NEUTROABS 9.2*  --   --   HGB 13.8 14.1 12.7  HCT 39.8 40.9 37.4  MCV 92.6 92.5 95.2  PLT 345 324 767   Basic Metabolic Panel: Recent Labs  Lab 10/15/21 0629 10/15/21 2125 10/16/21 0421  NA 127* 130* 126*  125*  K 3.2* 3.2* 3.0*  3.0*  CL 90* 92* 92*  92*  CO2 25 26 26  26   GLUCOSE 118*  101* 97  97  BUN 17 17 25*  25*  CREATININE 0.56 0.56  0.55 0.58  0.58  CALCIUM 8.6* 8.4* 8.0*  7.9*  MG  --  2.2  --   PHOS  --  3.4 4.0   GFR: Estimated Creatinine Clearance: 57.4 mL/min (by C-G formula based on SCr of 0.58 mg/dL). Liver Function Tests: Recent Labs  Lab 10/15/21 0629 10/15/21 2125 10/16/21 0421  AST 24  --  21  ALT 24  --  24  ALKPHOS 55  --  51  BILITOT 0.9  --  0.9  PROT 8.0  --  6.7  ALBUMIN 3.8 3.8 3.3*  3.3*   No results for input(s): LIPASE, AMYLASE in the last 168 hours. No results for input(s): AMMONIA in the last 168 hours. Coagulation Profile: No results for input(s): INR, PROTIME in the last 168 hours. Cardiac Enzymes: No results for input(s): CKTOTAL, CKMB, CKMBINDEX, TROPONINI in the last 168 hours. BNP (last 3 results) No results for input(s): PROBNP in the last 8760 hours. HbA1C: No results for input(s): HGBA1C in the last 72 hours. CBG: No results for input(s): GLUCAP in the last 168 hours. Lipid Profile: No results for input(s): CHOL, HDL, LDLCALC, TRIG, CHOLHDL, LDLDIRECT in the last 72 hours. Thyroid Function Tests: No results for input(s): TSH, T4TOTAL, FREET4, T3FREE, THYROIDAB in the last 72 hours. Anemia Panel: No results for input(s): VITAMINB12, FOLATE, FERRITIN, TIBC, IRON, RETICCTPCT in the last 72 hours. Sepsis Labs: No results for input(s): PROCALCITON, LATICACIDVEN in the last 168 hours.  Recent Results (from the past 240 hour(s))  Resp Panel by RT-PCR (Flu A&B, Covid) Nasopharyngeal Swab     Status: Abnormal   Collection Time: 10/14/21 11:53 PM   Specimen: Nasopharyngeal Swab; Nasopharyngeal(NP) swabs in vial transport medium  Result Value Ref Range Status   SARS Coronavirus 2 by RT PCR POSITIVE (A) NEGATIVE Final    Comment: CRITICAL RESULT CALLED TO, READ BACK BY AND VERIFIED WITH: TJ RIVERS RN 10/15/21 @ 0107 VS (NOTE) SARS-CoV-2 target nucleic acids are DETECTED.  The SARS-CoV-2 RNA is generally  detectable in upper respiratory specimens during the acute phase of infection. Positive results are indicative of the presence of the identified virus, but do not rule out bacterial infection or co-infection with other pathogens not detected by the test. Clinical correlation with patient history and other diagnostic information is necessary to determine patient infection status. The expected result is Negative.  Fact Sheet for Patients: EntrepreneurPulse.com.au  Fact Sheet for Healthcare Providers: IncredibleEmployment.be  This test is not yet approved or cleared by the [place] FDA and  has been authorized for detection and/or diagnosis of SARS-CoV-2 by FDA under an Emergency Use Authorization (EUA).  This EUA will  remain in effect (meaning this test  can be used) for the duration of  the COVID-19 declaration under Section 564(b)(1) of the Act, 21 U.S.C. section 360bbb-3(b)(1), unless the authorization is terminated or revoked sooner.     Influenza A by PCR NEGATIVE NEGATIVE Final   Influenza B by PCR NEGATIVE NEGATIVE Final    Comment: (NOTE) The Xpert Xpress SARS-CoV-2/FLU/RSV plus assay is intended as an aid in the diagnosis of influenza from Nasopharyngeal swab specimens and should not be used as a sole basis for treatment. Nasal washings and aspirates are unacceptable for Xpert Xpress SARS-CoV-2/FLU/RSV testing.  Fact Sheet for Patients: EntrepreneurPulse.com.au  Fact Sheet for Healthcare Providers: IncredibleEmployment.be  This test is not yet approved or cleared by the [place] FDA and has been authorized for detection and/or diagnosis of SARS-CoV-2 by FDA under an Emergency Use Authorization (EUA). This EUA will remain in effect (meaning this test can be used) for the duration of the COVID-19 declaration under Section 564(b)(1) of the Act, 21 U.S.C. section 360bbb-3(b)(1), unless the  authorization is terminated or revoked.  Performed at [facility], [place] [address], [place], [place] [address]          Radiology Studies: DG Chest 2 View  Result Date: 10/14/2021 CLINICAL DATA:  Shortness of breath. EXAM: CHEST - 2 VIEW COMPARISON:  Chest x-ray 10/21/2009 FINDINGS: Heart is enlarged, unchanged. There is central pulmonary vascular congestion. Minimal strandy opacities in the lung bases likely represent atelectasis. There is no focal lung consolidation, pleural effusion or pneumothorax. There are surgical changes in the right shoulder. No acute fractures. IMPRESSION: 1. Cardiomegaly with central pulmonary vascular congestion. Electronically Signed   By: Ronney Asters M.D.   On: 10/14/2021 22:36        Scheduled Meds:  enoxaparin (LOVENOX) injection  40 mg Subcutaneous Q24H   furosemide  40 mg Intravenous BID   guaiFENesin  600 mg Oral BID   [START ON 10/17/2021] potassium chloride  40 mEq Oral Daily   potassium chloride  60 mEq Oral Once   Continuous Infusions:   ceFAZolin (ANCEF) IV 1 g (10/16/21 0528)     LOS: 1 day    Time spent: 35 mins.More than 50% of that time was spent in counseling and/or coordination of care.      Shelly Coss, MD [facility] P11/30/2022, 8:09 AM

## 2021-10-16 NOTE — Plan of Care (Signed)

## 2021-10-17 ENCOUNTER — Inpatient Hospital Stay (HOSPITAL_COMMUNITY): Payer: Medicare HMO

## 2021-10-17 DIAGNOSIS — I509 Heart failure, unspecified: Secondary | ICD-10-CM | POA: Diagnosis not present

## 2021-10-17 DIAGNOSIS — I5021 Acute systolic (congestive) heart failure: Secondary | ICD-10-CM | POA: Diagnosis not present

## 2021-10-17 LAB — ECHOCARDIOGRAM LIMITED
AR max vel: 0.84 cm2
AV Area VTI: 0.88 cm2
AV Area mean vel: 0.81 cm2
AV Mean grad: 11.5 mmHg
AV Peak grad: 20.7 mmHg
Ao pk vel: 2.28 m/s
Area-P 1/2: 4.57 cm2
Height: 58 in
S' Lateral: 3.4 cm
Single Plane A4C EF: 32.2 %
Weight: 3033.53 oz

## 2021-10-17 LAB — BASIC METABOLIC PANEL
Anion gap: 9 (ref 5–15)
BUN: 23 mg/dL (ref 8–23)
CO2: 26 mmol/L (ref 22–32)
Calcium: 7.7 mg/dL — ABNORMAL LOW (ref 8.9–10.3)
Chloride: 93 mmol/L — ABNORMAL LOW (ref 98–111)
Creatinine, Ser: 0.51 mg/dL (ref 0.44–1.00)
GFR, Estimated: >60 mL/min (ref >60)
Glucose, Bld: 113 mg/dL — ABNORMAL HIGH (ref 70–99)
Potassium: 3.6 mmol/L (ref 3.5–5.1)
Sodium: 128 mmol/L — ABNORMAL LOW (ref 135–145)

## 2021-10-17 LAB — BRAIN NATRIURETIC PEPTIDE: B Natriuretic Peptide: 363.8 pg/mL — ABNORMAL HIGH (ref 0.0–100.0)

## 2021-10-17 NOTE — Progress Notes (Signed)
PROGRESS NOTE    Amy [name]  UTM:[id number] DOB: 06/25/1946 DOA: 10/14/2021 PCP: Shirline Frees, MD   Chief Complain: Shortness of breath  Brief Narrative: Patient is a 75 year old female with history of hyperlipidemia who presented with shortness of breath that started about 3 weeks ago.  She was also complaining of cough.  Cough did not improve with outpatient medication.  She was seen in the urgent care and sent to the emergency department.  COVID screen test, today was diabetes is elevated for cardiomegaly, pulmonary vascular congestion.  Labs showed elevated BNP.  Patient was admitted for the management of suspected acute congestive heart failure.  Currently on IV Lasix.  Echocardiogram pending.  Assessment & Plan:   Principal Problem:   Acute CHF (congestive heart failure) (HCC)   Acute congestive heart failure: No history of CHF.  Presented with cough ,elevated BNP.  Chest x-ray suggestive of CHF.  Started on Lasix 40 mg twice daily.  Echo pending.  Continue to monitor daily weight, input/output. Has bibasilar crackles but no peripheral edema.  We will continue the Lasix today at current dose.  COVID-19: Symptoms started about 3 weeks ago.  No pneumonia seen on the chest x-ray.  Currently not hypoxic.  No acute indication for treatment.  Continue supportive care  Hyponatremia: Sodium in the range of 125 on presentation.  Possible hypervolemic hyponatremia from CHF.  Continue monitor.  Na has improved to the range of 128 today  Hyperlipidemia: On statin  Suspected left lower extremity cellulitis versus venous stasis: Started on cefazolin,changed  to keflex  Hypokalemia: Currently being supplemented, monitor.  Morbid obesity: BMI of 39.6        DVT prophylaxis: Lovenox Code Status: Full code Family Communication: None at bedside Patient status:  Dispo: The patient is from: Home               Anticipated d/c is to: Home              Anticipated d/c date is: 1-2  days  Consultants: None  Procedures:None  Antimicrobials:  Anti-infectives (From admission, onward)    Start     Dose/Rate Route Frequency Ordered Stop   10/16/21 1400  cephALEXin (KEFLEX) capsule 500 mg        500 mg Oral Every 8 hours 10/16/21 1121 10/20/21 1359   10/15/21 2200  ceFAZolin (ANCEF) IVPB 1 g/50 mL premix  Status:  Discontinued        1 g 100 mL/hr over 30 Minutes Intravenous Every 8 hours 10/15/21 2040 10/16/21 1121       Subjective:  Patient seen and examined the bedside this morning.  Hemodynamically stable.  Comfortable, denies any complaints but was on 1 L of oxygen per minute.   Objective: Vitals:   10/16/21 0930 10/16/21 1245 10/16/21 2005 10/17/21 0424  BP:  (!) 147/58 136/63 (!) 143/85  Pulse:  88 93 94  Resp:  (!) 22 19 20   Temp:  98.2 F (36.8 C) 98.2 F (36.8 C) 98.2 F (36.8 C)  TempSrc:  Oral    SpO2: 94% 96% 91% 97%  Weight:      Height:        Intake/Output Summary (Last 24 hours) at 10/17/2021 0805 Last data filed at 10/16/2021 2124 Gross per 24 hour  Intake 540 ml  Output 1400 ml  Net -860 ml   Filed Weights   10/15/21 2046 10/16/21 0500  Weight: 85.3 kg 86 kg    Examination:  General  exam: Overall comfortable, not in distress.obese HEENT: PERRL Respiratory system:  no wheezes or crackles  Cardiovascular system: S1 & S2 heard, RRR.  Gastrointestinal system: Abdomen is nondistended, soft and nontender. Central nervous system: Alert and oriented Extremities: No edema, no clubbing ,no cyanosis Skin: Chronic venous stasis on bilateral lower extremities, no ulcers,no icterus      Data Reviewed: I have personally reviewed following labs and imaging studies  CBC: Recent Labs  Lab 10/15/21 0629 10/15/21 2125 10/16/21 0421  WBC 13.5* 12.6* 11.1*  NEUTROABS 9.2*  --   --   HGB 13.8 14.1 12.7  HCT 39.8 40.9 37.4  MCV 92.6 92.5 95.2  PLT 345 324 233   Basic Metabolic Panel: Recent Labs  Lab 10/15/21 0629  10/15/21 2125 10/16/21 0421 10/17/21 0357  NA 127* 130* 126*  125* 128*  K 3.2* 3.2* 3.0*  3.0* 3.6  CL 90* 92* 92*  92* 93*  CO2 25 26 26  26 26   GLUCOSE 118* 101* 97  97 113*  BUN 17 17 25*  25* 23  CREATININE 0.56 0.56  0.55 0.58  0.58 0.51  CALCIUM 8.6* 8.4* 8.0*  7.9* 7.7*  MG  --  2.2  --   --   PHOS  --  3.4 4.0  --    GFR: Estimated Creatinine Clearance: 57.4 mL/min (by C-G formula based on SCr of 0.51 mg/dL). Liver Function Tests: Recent Labs  Lab 10/15/21 0629 10/15/21 2125 10/16/21 0421  AST 24  --  21  ALT 24  --  24  ALKPHOS 55  --  51  BILITOT 0.9  --  0.9  PROT 8.0  --  6.7  ALBUMIN 3.8 3.8 3.3*  3.3*   No results for input(s): LIPASE, AMYLASE in the last 168 hours. No results for input(s): AMMONIA in the last 168 hours. Coagulation Profile: No results for input(s): INR, PROTIME in the last 168 hours. Cardiac Enzymes: No results for input(s): CKTOTAL, CKMB, CKMBINDEX, TROPONINI in the last 168 hours. BNP (last 3 results) No results for input(s): PROBNP in the last 8760 hours. HbA1C: Recent Labs    10/15/21 2125  HGBA1C 6.3*   CBG: No results for input(s): GLUCAP in the last 168 hours. Lipid Profile: No results for input(s): CHOL, HDL, LDLCALC, TRIG, CHOLHDL, LDLDIRECT in the last 72 hours. Thyroid Function Tests: Recent Labs    10/16/21 0421  TSH 0.829   Anemia Panel: No results for input(s): VITAMINB12, FOLATE, FERRITIN, TIBC, IRON, RETICCTPCT in the last 72 hours. Sepsis Labs: No results for input(s): PROCALCITON, LATICACIDVEN in the last 168 hours.  Recent Results (from the past 240 hour(s))  Resp Panel by RT-PCR (Flu A&B, Covid) Nasopharyngeal Swab     Status: Abnormal   Collection Time: 10/14/21 11:53 PM   Specimen: Nasopharyngeal Swab; Nasopharyngeal(NP) swabs in vial transport medium  Result Value Ref Range Status   SARS Coronavirus 2 by RT PCR POSITIVE (A) NEGATIVE Final    Comment: CRITICAL RESULT CALLED TO, READ BACK  BY AND VERIFIED WITH: TJ RIVERS RN 10/15/21 @ 0107 VS (NOTE) SARS-CoV-2 target nucleic acids are DETECTED.  The SARS-CoV-2 RNA is generally detectable in upper respiratory specimens during the acute phase of infection. Positive results are indicative of the presence of the identified virus, but do not rule out bacterial infection or co-infection with other pathogens not detected by the test. Clinical correlation with patient history and other diagnostic information is necessary to determine patient infection status. The expected result  is Negative.  Fact Sheet for Patients: EntrepreneurPulse.com.au  Fact Sheet for Healthcare Providers: IncredibleEmployment.be  This test is not yet approved or cleared by the [place] FDA and  has been authorized for detection and/or diagnosis of SARS-CoV-2 by FDA under an Emergency Use Authorization (EUA).  This EUA will remain in effect (meaning this test  can be used) for the duration of  the COVID-19 declaration under Section 564(b)(1) of the Act, 21 U.S.C. section 360bbb-3(b)(1), unless the authorization is terminated or revoked sooner.     Influenza A by PCR NEGATIVE NEGATIVE Final   Influenza B by PCR NEGATIVE NEGATIVE Final    Comment: (NOTE) The Xpert Xpress SARS-CoV-2/FLU/RSV plus assay is intended as an aid in the diagnosis of influenza from Nasopharyngeal swab specimens and should not be used as a sole basis for treatment. Nasal washings and aspirates are unacceptable for Xpert Xpress SARS-CoV-2/FLU/RSV testing.  Fact Sheet for Patients: EntrepreneurPulse.com.au  Fact Sheet for Healthcare Providers: IncredibleEmployment.be  This test is not yet approved or cleared by the [place] FDA and has been authorized for detection and/or diagnosis of SARS-CoV-2 by FDA under an Emergency Use Authorization (EUA). This EUA will remain in effect (meaning this  test can be used) for the duration of the COVID-19 declaration under Section 564(b)(1) of the Act, 21 U.S.C. section 360bbb-3(b)(1), unless the authorization is terminated or revoked.  Performed at [facility], [place] [address] [place], [place] [address]   Culture, blood (routine x 2)     Status: None (Preliminary result)   Collection Time: 10/15/21  9:25 PM   Specimen: BLOOD  Result Value Ref Range Status   Specimen Description   Final    BLOOD LEFT ANTECUBITAL Performed at [place] [address], [place], [place] [address]    Special Requests   Final    BOTTLES DRAWN AEROBIC ONLY Blood Culture adequate volume Performed at [place] [address], [place], [place] [address]    Culture   Final    NO GROWTH 1 DAY Performed at [facility], [place] [address] [place], [place] [address]    Report Status PENDING  Incomplete  Culture, blood (routine x 2)     Status: None (Preliminary result)   Collection Time: 10/15/21  9:25 PM   Specimen: BLOOD RIGHT HAND  Result Value Ref Range Status   Specimen Description   Final    BLOOD RIGHT HAND Performed at [place] [address], [place], [place] [address]    Special Requests   Final    BOTTLES DRAWN AEROBIC ONLY Blood Culture adequate volume Performed at [place] [address], [place], [place] [address]    Culture   Final    NO GROWTH 1 DAY Performed at [facility], [place] [address] [place], [place] [address]    Report Status PENDING  Incomplete         Radiology Studies: No results found.      Scheduled Meds:  cephALEXin  500 mg Oral Q8H   enoxaparin (LOVENOX) injection  40 mg Subcutaneous Q24H   furosemide  40 mg Intravenous BID   guaiFENesin  600 mg Oral BID   potassium chloride  40 mEq Oral Daily   Continuous Infusions:     LOS: 2 days    Time spent: 35 mins.More than 50% of that  time was spent in counseling and/or coordination of care.  Shelly Coss, MD [facility] P12/11/2020, 8:05 AM

## 2021-10-17 NOTE — Plan of Care (Signed)
Pt satisfied with pain regimen. Pt ate at least 50% of meals.    Problem: Education: Goal: Knowledge of General Education information will improve Description: Including pain rating scale, medication(s)/side effects and non-pharmacologic comfort measures Outcome: Progressing   Problem: Health Behavior/Discharge Planning: Goal: Ability to manage health-related needs will improve Outcome: Progressing   Problem: Clinical Measurements: Goal: Ability to maintain clinical measurements within normal limits will improve Outcome: Progressing Goal: Will remain free from infection Outcome: Progressing Goal: Diagnostic test results will improve Outcome: Progressing Goal: Respiratory complications will improve Outcome: Progressing Goal: Cardiovascular complication will be avoided Outcome: Progressing   Problem: Activity: Goal: Risk for activity intolerance will decrease Outcome: Progressing   Problem: Nutrition: Goal: Adequate nutrition will be maintained Outcome: Progressing   Problem: Coping: Goal: Level of anxiety will decrease Outcome: Progressing   Problem: Elimination: Goal: Will not experience complications related to bowel motility Outcome: Progressing Goal: Will not experience complications related to urinary retention Outcome: Progressing   Problem: Pain Managment: Goal: General experience of comfort will improve Outcome: Progressing   Problem: Safety: Goal: Ability to remain free from injury will improve Outcome: Progressing   Problem: Skin Integrity: Goal: Risk for impaired skin integrity will decrease Outcome: Progressing   Problem: Education: Goal: Knowledge of risk factors and measures for prevention of condition will improve Outcome: Progressing   Problem: Coping: Goal: Psychosocial and spiritual needs will be supported Outcome: Progressing   Problem: Respiratory: Goal: Will maintain a patent airway Outcome: Progressing Goal: Complications related  to the disease process, condition or treatment will be avoided or minimized Outcome: Progressing   Problem: Education: Goal: Ability to demonstrate management of disease process will improve Outcome: Progressing Goal: Ability to verbalize understanding of medication therapies will improve Outcome: Progressing Goal: Individualized Educational Video(s) Outcome: Progressing   Problem: Activity: Goal: Capacity to carry out activities will improve Outcome: Progressing   Problem: Cardiac: Goal: Ability to achieve and maintain adequate cardiopulmonary perfusion will improve Outcome: Progressing

## 2021-10-17 NOTE — Progress Notes (Signed)
  Echocardiogram 2D Echocardiogram has been performed.  Amy [name] 10/17/2021, 2:55 PM

## 2021-10-18 ENCOUNTER — Encounter (HOSPITAL_COMMUNITY): Payer: Self-pay | Admitting: Internal Medicine

## 2021-10-18 DIAGNOSIS — I5041 Acute combined systolic (congestive) and diastolic (congestive) heart failure: Secondary | ICD-10-CM | POA: Diagnosis not present

## 2021-10-18 DIAGNOSIS — I509 Heart failure, unspecified: Secondary | ICD-10-CM | POA: Diagnosis not present

## 2021-10-18 DIAGNOSIS — U071 COVID-19: Secondary | ICD-10-CM

## 2021-10-18 LAB — BASIC METABOLIC PANEL
Anion gap: 8 (ref 5–15)
BUN: 33 mg/dL — ABNORMAL HIGH (ref 8–23)
CO2: 27 mmol/L (ref 22–32)
Calcium: 8.3 mg/dL — ABNORMAL LOW (ref 8.9–10.3)
Chloride: 97 mmol/L — ABNORMAL LOW (ref 98–111)
Creatinine, Ser: 0.61 mg/dL (ref 0.44–1.00)
GFR, Estimated: >60 mL/min (ref >60)
Glucose, Bld: 125 mg/dL — ABNORMAL HIGH (ref 70–99)
Potassium: 3.8 mmol/L (ref 3.5–5.1)
Sodium: 132 mmol/L — ABNORMAL LOW (ref 135–145)

## 2021-10-18 MED ORDER — TORSEMIDE 20 MG PO TABS
20.0000 mg | ORAL_TABLET | Freq: Every day | ORAL | Status: DC
  Administered 2021-10-18 – 2021-10-23 (×6): 20 mg via ORAL
  Filled 2021-10-18 (×6): qty 1

## 2021-10-18 MED ORDER — METOPROLOL SUCCINATE ER 25 MG PO TB24
12.5000 mg | ORAL_TABLET | Freq: Every day | ORAL | Status: DC
  Administered 2021-10-18 – 2021-10-23 (×6): 12.5 mg via ORAL
  Filled 2021-10-18 (×6): qty 1

## 2021-10-18 NOTE — Progress Notes (Signed)
Nutrition Education Note  RD consulted for nutrition education regarding low sodium diet.   RD will place "Low Sodium Nutrition Therapy", "Sodium Content of Foods", and "Fluid Restriction Nutrition Therapy" handouts from the Academy of Nutrition and Dietetics. Reviewed patient's dietary recall. Provided examples on ways to decrease sodium intake in diet. Discouraged intake of processed foods and use of salt shaker. Encouraged fresh fruits and vegetables as well as whole grain sources of carbohydrates to maximize fiber intake.   RD discussed why it is important for patient to adhere to diet recommendations, and emphasized the role of fluids, foods to avoid, and importance of weighing self daily. Teach back method used.  Patient with no cardiac medical history. She was found to be COVID-19 positive at the time of admission.   Expect fair compliance.  Body mass index is 39.9 kg/m. Pt meets criteria for obesity/borderline morbid obesity based on current BMI.  Current diet order is Heart Healthy/Carb Modified with 1.2 L fluid restriction, patient is consuming approximately 100% of meals at this time. Labs and medications reviewed. No further nutrition interventions warranted at this time. RD contact information provided. If additional nutrition issues arise, please re-consult RD.      Jarome Matin, MS, RD, LDN, CNSC Inpatient Clinical Dietitian RD pager # available in [place]  After hours/weekend pager # available in [facility]

## 2021-10-18 NOTE — Discharge Instructions (Signed)
Low-Sodium Nutrition Therapy Eating less sodium can help you if you have high blood pressure, heart failure, or kidney or liver disease. Your body needs a little sodium, but too much sodium can cause your body to hold onto extra water.  This extra water will raise your blood pressure and can cause damage to your heart, kidneys, or liver as they are forced to work harder. Sometimes you can see how the extra fluid affects you because your hands, legs, or belly swell.  You may also hold water around your heart and lungs, which makes it hard to breathe. Even if you take medication for blood pressure or a water pill (diuretic) to remove fluid, it is still important to have less salt in your diet. Check with your primary care provider before drinking alcohol since it may affect the amount of fluid in your body and how your heart, kidneys, or liver work.  Sodium in Food A low-sodium meal plan limits the sodium that you get from food and beverages to 1,500-2,000 milligrams (mg) per day. Salt is the main source of sodium.  Read the nutrition label on the package to find out how much sodium is in one serving of a food. Select foods with 140 milligrams (mg) of sodium or less per serving. You may be able to eat one or two servings of foods with a little more than 140 milligrams (mg) of sodium if you are closely watching how much sodium you eat in a day. Check the serving size on the label. The amount of sodium listed on the label shows the amount in one serving of the food. So, if you eat more than one serving, you will get more sodium than the amount listed.  Cutting Back on Sodium Eat more fresh foods. Fresh fruits and vegetables are low in sodium, as well as frozen vegetables and fruits that have no added juices or sauces. Fresh meats are lower in sodium than processed meats, such as bacon, sausage, and hotdogs. Not all processed foods are unhealthy, but some processed foods may have too much  sodium. Eat less salt at the table and when cooking.  One of the ingredients in salt is sodium. One teaspoon of table salt has 2,300 milligrams of sodium. Leave the salt out of recipes for pasta, casseroles, and soups. Be a smart shopper. Food packages that say "Salt-free", sodium-free", "very low sodium," and "low sodium" have less than 140 milligrams of sodium per serving. Beware of products identified as "Unsalted," "No Salt Added," "Reduced Sodium," or "Lower Sodium."  These items may still be high in sodium. You should always check the nutrition label. Add flavors to your food without adding sodium. Try lemon juice, lime juice, or vinegar. Dry or fresh herbs add flavor. Buy a sodium-free seasoning blend or make your own at home. You can purchase salt-free or sodium-free condiments like barbeque sauce in stores and online.   Eating in Restaurants Choose foods carefully when you eat outside your home. Restaurant foods can be very high in sodium.  Many restaurants provide nutrition facts on their menus or their websites. If you cannot find that information, ask your server.  Let your server know that you want your food to be cooked without salt and that you would like your salad dressing and sauces to be served on the side.  Foods Recommended Grains Bread and rolls without salted tops Homemade bread made with reduced-sodium baking powder Cold cereals, especially shredded wheat and puffed rice Oats, grits, or   cream of wheat Pastas, quinoa, and rice Popcorn, pretzels or crackers without salt Corn tortillas Protein Foods Fresh meats and fish (check the nutrition labels - make sure they are not packaged in a sodium solution) Canned or packed tuna (no more than 4 ounces at 1 serving) Beans and peas Soybeans and tofu Eggs Nuts or nut butters without salt Dairy Milk or milk powder Plant milks, such as rice and soy Yogurt, including Greek yogurt Small amounts of natural cheese  (blocks of cheese) or reduced-sodium cheese can be used in moderation.  (Swiss, ricotta, and fresh mozzarella cheese are lower in sodium than the others) Cream Cheese Low sodium cottage cheese Vegetables Fresh and frozen vegetables without added sauces or salt Homemade soups (without salt) Low-sodium, salt-free or sodium-free canned vegetables and soups Fruit Fresh and canned fruits Dried fruits, such as raisins, cranberries, and prunes Oils Tub or liquid margarine, regular or without salt Canola, corn, peanut, olive, safflower, or sunflower oils Condiments Fresh or dried herbs such as basil, bay leaf, dill, mustard (dry), nutmeg, paprika, parsley, rosemary, sage, or thyme. Low sodium ketchup Vinegar Lemon or lime juice Pepper, red pepper flakes, and cayenne. Hot sauce contains sodium, but if you use just a drop or two, it will not add up to much. Salt-free or sodium-free seasoning mixes and marinades Simple salad dressings: vinegar and oil  Foods Not Recommended Grains Breads or crackers topped with salt Cereals (hot/cold) with more than 300 mg sodium per serving Biscuits, cornbread, and other "quick" breads prepared with baking soda Pre-packaged bread crumbs Seasoned and packaged rice and pasta mixes Self-rising flours Protein Foods Cured meats: Bacon, ham, sausage, pepperoni and hot dogs Canned meats (chili, vienna sausage, or sardines) Smoked fish and meats Frozen meals that have more than 600 mg of sodium per serving Egg substitute (with added sodium) Dairy Buttermilk Processed cheese spreads Cottage cheese (1 cup may have over 500 mg of sodium; look for low-sodium.) American or feta cheese Shredded cheese has more sodium than blocks of cheese String cheese Vegetables Canned vegetables (unless they are salt-free, sodium-free or low sodium) Frozen vegetables with seasoning and sauces Sauerkraut and pickled vegetables Canned or dried soups (unless they are  salt-free, sodium-free, or low sodium) [place] fries and onion rings Fruit  Dried fruits preserved with additives that have sodium Oils  Salted butter or margarine, all types of olives Condiments Salt, sea salt, kosher salt, onion salt, and garlic salt Seasoning mixes with salt Bouillon cubes Ketchup Barbeque sauce and Worcestershire sauce unless low sodium Soy sauce Salsa, pickles, olives, relish Salad dressings: ranch, blue cheese, [place], and [place].    Sodium (Salt) Content of Foods (2022) Your sodium needs may increase or decrease if you have certain medical conditions.  Your sodium goal is 1500-2000 milligrams per day. A moderate-sodium or low-sodium food can become a high-sodium food if you eat a large amount. A high-sodium food can be considered a low-sodium food if you only eat a small amount of it. The Nutrition Facts label on food products lists the amount of sodium in milligrams (mg) per serving.  Foods High in Sodium (more than 300 mg) Food Group Food Serving Amount (mg)  Grains  Bagel, 4-inch 1 400-450 Biscuit, 2-inch 1 330 Croissant 2 ounces 425 Pretzels, salted 1 ounce 400 Rice, seasoned, prepared from box 1 cup 600-900  Protein Foods  Bacon 2 slices 440 Beef, dried 1 ounce 790 Beans, canned, such as garbanzo or kidney  cup 405-445 Fish, canned or smoked,  salmon or sardines 3 ounces 400-700 Ham 1 ounce 375 Hot dog, beef or pork 1 510 Veggie burger 1 380  Dairy and Dairy Alternatives Cheese, American or feta 1 ounce 325-475 Cottage cheese  cup 460 Buttermilk 1 cup 260  Vegetables  Tomato or vegetable juice  cup 640 Tomato sauce  cup 510 Mushrooms, canned  cup 330  Other  Barbecue sauce 2 tablespoons 350 Ketchup 2 tablespoons 335 Fish sauce 1 tablespoon 900-1,500 Oyster sauce 1 tablespoon 670 Pasta sauces: alfredo, spaghetti, or marinara  cup 640-800 Pickle 1 large 570 Salt (table salt) 1 teaspoon 2,325 Soup, canned 1 cup  700-1,000 Soy sauce, low sodium or regular 1 tablespoon 600-900 Teriyaki sauce 1 tablespoon 690   Foods Moderate in Sodium (140-300 mg) Food Group Food Serving Amount (mg)  Grains  Bread: pumpernickel, rye, or white 1 slice 546-568 Bun, hot dog or hamburger 1 205 Cereal: raisin bran  cup 175 English muffin 1 250 Pancake or waffle, 4-inch 1 240 Pasta  cup 0 Pita, 4-inch 1 150 Rice  cup 0 Tortilla, 6-inch 1 205  Protein Foods  Lunch meat, pork or beef 1 ounce 210 Nuts, salted, mixed, or peanuts  cup 190-230 Sausage: beef, chicken, or pork 1 ounce 200 Tuna, canned in water 3 ounces 290  Dairy and Dairy Alternatives Cheese: cheddar, [place], mozzarella, or provolone 1 ounce 150-175 Cheese, parmesan 2 tablespoons 150 Cheese, ricotta  cup 155  Vegetables  Vegetables, canned: corn, green beans, or tomatoes  cup 160-285  Other  Miso  cup 160 Olives 5 large 190 Pudding, ready-to-eat  cup 190 Salad dressing 2 tablespoons 200-300 Salsa 2 tablespoons 205   Foods Low in Sodium (less than 140 mg) Food Group Food Serving Amount (mg)  Grains  Bread, [place] or wheat 1 slice 127-517 Cereal: bran, corn, rice, or wheat  cup 100-140  Protein Foods  Egg substitute, liquid  cup 120 Egg, whole 1 large 70 Meats; fresh; beef, fish, lamb, pork, or poultry 3 ounces 60-100 Nut butters: almond, cashew, or peanut 1 tablespoon 70 Seeds, unsalted: pumpkin or sunflower 2 tablespoons 55  Dairy and Dairy Alternatives Milk 1 cup 100 Milk, evaporated  cup 135 Soymilk 1 cup 125 Yogurt 6 ounces 55  Vegetables  Vegetables, canned, low sodium  cup 140 or less Vegetables, fresh or frozen  cup 5-10  Fruit  Fruit, fresh, frozen, or canned 1 piece or  cup 0-5  Other  Butter, salted 1 tablespoon 90 Margarine 1 tablespoon 135 Mustard 1 teaspoon 55    Fluid-Restricted Nutrition Therapy You have been prescribed this diet because your condition affects how much fluid you can  eat or drink.  If your heart, liver, or kidneys aren't working properly, you may not be able to effectively eliminate fluids from the body and this may cause swelling (edema) in the legs, arms, and/or stomach. Drink no more than 1.2 liters or 40 ounces or 5 cups of fluid per day. You don't need to stop eating or drinking the same fluids you normally would, but you may need to eat or drink less than usual.  Breakfast- Include fluids taken with medications Lunch- Include fluids taken with medications Dinner- Include fluids taken with medications Bedtime Snack- Include fluids taken with medications  What Are Fluids? A fluid is anything that is liquid or anything that would melt if left at room temperature.  You will need to count these foods and liquids--including any liquid used to take medication--as part of your daily  fluid intake.  Some examples are: Alcohol (drink only with your doctor's permission) Coffee, tea, and other hot beverages Gelatin (Jell-O) Gravy Ice cream, sherbet, sorbet Ice cubes, ice chips Milk, liquid creamer Nutritional supplements (such as Ensure or Boost) Popsicles Vegetable and fruit juices; fluid in canned fruit Watermelon Yogurt Soft drinks, lemonade, limeade Soups Syrup  How Do I Measure My Fluid Intake? Record your fluid intake daily. Tip: Every day, each time you eat or drink fluids, pour water in the same amount into an empty container that can hold the same amount of fluids you are allowed daily.  This may help you keep track of how much fluid you are taking in throughout the day. To accurately keep track of how much liquid you take in, measure the size of the cups, glasses, and bowls you use.  If you eat soup, measure how much of it is liquid and how much is solid (such as noodles, vegetables, meat).  Conversions for Measuring Fluid Intake Milliliters (mL) Liters (L) Ounces (oz) Cups (c) 1000- 1; 32; 4 1200- 1.2; 40; 5 1500- 1.5; 50; 6  1/4 1800- 1.8; 60; 7 1/2 2000- 2; 67; 8 1/3  Tips to Reduce Your Thirst Chew gum or suck on hard candy. Rinse or gargle with mouthwash. Do not swallow. Ice chips or popsicles my help quench thirst, but this too needs to be calculated into the total restriction.  Melt ice chips or cubes first to figure out how much fluid they produce (for example, experiment with melting  cup ice chips or 2 ice cubes). Add a lemon wedge to your water. Limit how much salt you take in. A high salt intake might make you thirstier. Don't eat or drink all your allowed liquids at once. Space your liquids out through the day. Use small glasses and cups and sip slowly. If allowed, take your medications with fluids you eat or drink during a meal

## 2021-10-18 NOTE — Consult Note (Addendum)
Cardiology Consultation:   Patient ID: Amy [name] MRN: [id number]; DOB: 1946/09/02  Admit date: 10/14/2021 Date of Consult: 10/18/2021  PCP:  Shirline Frees, MD   [facility] Providers Cardiologist: New to HeartCare Click here to update MD or APP on Care Team, Refresh:1}     Patient Profile:   Amy [name] is a 75 y.o. female with a hx of HLD, obesity, GERD, pre-diabetes who is being seen 10/18/2021 for the evaluation of CHF and aortic stenosis at the request of Dr. Tawanna Solo.  History of Present Illness:   Ms. Citron has no prior cardiac hx or cardiac studies on file. She receives regular medical care with primary care. Several days into the start of November she developed headache, sore throat, nasal congestion and cough. She thought she had a cold, thinks perhaps she picked up while voting November 4th. She had a fever in the week to follow. The cold symptoms gradually subsided but over the last few weeks she'd noticed progressive dyspnea on exertion. No overt LEE, orthopnea, PND. No chest pain. She went to urgent care 3 days ago and was found to be hypoxic 87% RA so was sent to the hospital for further evaluation. CXR showed cardiomegaly with central pulmonary vascular congestion. She tested positive for Covid. Labs were also notable for hyponatremia (nadir 125), hypokalemia, hypochloremia, BNP 991.7 -> 363, hsTroponin 23-21, leukocytosis, A1c 6.3. As part of her workup she had 2D echo showing EF 30-35%, global HK with akinesis of inferolateral wall, calcified AV with probable moderate AS (mean gradient 12 mmHg; AVA calculated at 0.9 cm2 suggesting severe AS but DI not significant 0.35), grade 1 DD, mildly reduced RV function, mildly elevated PASP, severe MAC. She was started on IV Lasix, potassium. Also tx with antibiotics for suspected LLE cellulitis. She reports feeling much better. Per last VS the patient was able to wean to 95% on 1L but is now on RA, 92%.  Past Medical History:   Diagnosis Date   GERD (gastroesophageal reflux disease)    Hyperlipidemia    Obesity    Pre-diabetes     Past Surgical History:  Procedure Laterality Date   CARPAL TUNNEL RELEASE Bilateral    FEMUR FRACTURE SURGERY     REPLACEMENT TOTAL KNEE BILATERAL     SHOULDER SURGERY Right    rotator cuff     Home Medications:  Prior to Admission medications   Medication Sig Start Date End Date Taking? Authorizing Provider  acetaminophen (TYLENOL) 500 MG tablet Take 500 mg by mouth every 6 (six) hours as needed for mild pain or headache.   Yes [provider]  ascorbic acid (VITAMIN C) 500 MG tablet Take by mouth.   Yes [provider]  aspirin 81 MG EC tablet Take by mouth.   Yes [provider]  aspirin-sod bicarb-citric acid (ALKA-SELTZER) 325 MG TBEF tablet Take 325 mg by mouth every 6 (six) hours as needed (heartburn).   Yes [provider]  B Complex-C (B-COMPLEX WITH VITAMIN C) tablet Take 1 tablet by mouth daily.   Yes [provider]  Bismuth Subsalicylate (PEPTO-BISMOL PO) Take 1 tablet by mouth daily as needed (heartburn).   Yes [provider]  Carboxymethylcellulose Sodium (DRY EYE RELIEF OP) Apply 1 drop to eye 4 (four) times daily as needed (dryness).   Yes [provider]  cholecalciferol (VITAMIN D3) 25 MCG (1000 UNIT) tablet 1 tablet   Yes [provider]  Cinnamon 500 MG capsule  Yes [provider]  Coenzyme Q10 (CO Q-10) 200 MG CAPS 1 capsule with a meal   Yes [provider]  Cyanocobalamin (VITAMIN B 12 PO) Take 1 tablet by mouth daily.   Yes [provider]  Flaxseed, Linseed, (FLAX SEED OIL) 1000 MG CAPS    Yes [provider]  Ginger 500 MG CAPS    Yes [provider]  lovastatin (MEVACOR) 40 MG tablet Take 40 mg by mouth daily. 01/16/14  Yes [provider]  meclizine (ANTIVERT) 25 MG tablet Take 25 mg by mouth daily as needed for dizziness.  09/12/21  Yes [provider]  Multiple Vitamin (MULTI-VITAMIN) tablet Take 1 tablet by mouth daily.   Yes [provider]  Omega-3 Fatty Acids (FISH OIL) 1000 MG CAPS 1 capsule   Yes [provider]  omeprazole (PRILOSEC) 40 MG capsule Take 40 mg by mouth daily. 09/12/21  Yes [provider]  Pramoxine-Dimethicone (GOLD BOND INTENSIVE HEALING EX) Apply 1 application topically daily as needed (eczema).   Yes [provider]  vitamin E 1000 UNIT capsule Take by mouth.   Yes [provider]  gabapentin (NEURONTIN) 300 MG capsule Take 1 capsule by mouth 3 (three) times daily. Patient not taking: Reported on 10/16/2021 01/16/14   [provider]    Inpatient Medications: Scheduled Meds:  cephALEXin  500 mg Oral Q8H   enoxaparin (LOVENOX) injection  40 mg Subcutaneous Q24H   furosemide  40 mg Intravenous BID   guaiFENesin  600 mg Oral BID   potassium chloride  40 mEq Oral Daily   Continuous Infusions:  PRN Meds: acetaminophen **OR** acetaminophen, guaiFENesin-dextromethorphan, Ipratropium-Albuterol, lip balm, polyvinyl alcohol  Allergies:    Allergies  Allergen Reactions   Morphine And Related Nausea And Vomiting   Timolol     Other reaction(s): SOB    Social History:   Social History   Socioeconomic History   Marital status: Married    Spouse name: Not on file   Number of children: Not on file   Years of education: Not on file   Highest education level: Not on file  Occupational History   Not on file  Tobacco Use   Smoking status: Never   Smokeless tobacco: Never  Substance and Sexual Activity   Alcohol use: No   Drug use: No   Sexual activity: Not on file  Other Topics Concern   Not on file  Social History Narrative   Not on file   Social Determinants of Health   Financial Resource Strain: Not on file  Food Insecurity: Not on file  Transportation Needs: Not on file  Physical Activity: Not on file   Stress: Not on file  Social Connections: Not on file  Intimate Partner Violence: Not on file    Family History:    Family History  Problem Relation Age of Onset   CAD Neg Hx      ROS:  Please see the history of present illness.   All other ROS reviewed and negative.     Physical Exam/Data:   Vitals:   10/18/21 0910 10/18/21 0915 10/18/21 0916 10/18/21 1050  BP:    115/75  Pulse:    (!) 101  Resp:    18  Temp:    98.2 F (36.8 C)  TempSrc:    Oral  SpO2: 92% 90% 95% 92%  Weight:      Height:        Intake/Output Summary (Last 24  hours) at 10/18/2021 1106 Last data filed at 10/18/2021 1000 Gross per 24 hour  Intake 720 ml  Output 1850 ml  Net -1130 ml   Last 3 Weights 10/18/2021 10/16/2021 10/15/2021  Weight (lbs) 190 lb 14.7 oz 189 lb 9.5 oz 188 lb 1.6 oz  Weight (kg) 86.6 kg 86 kg 85.322 kg     Body mass index is 39.9 kg/m.  General: Well developed, well nourished obese WF in no acute distress. Head: Normocephalic, atraumatic, sclera non-icteric, no xanthomas, nares are without discharge. Neck: Negative for carotid bruits. JVP not elevated. Lungs: Coarse BS throughout with mildly decreased air movement throughout. Breathing is unlabored. Heart: RRR S1 S2, 2/6 SEM at RUSB, no rubs or gallops.  Abdomen: Soft, non-tender, non-distended with normoactive bowel sounds. No rebound/guarding. Extremities: No clubbing or cyanosis. No edema. Distal pedal pulses are 2+ and equal bilaterally. Chronic venous stasis skin thickening with dry skin/scaling Neuro: Alert and oriented X 3. Moves all extremities spontaneously. Psych:  Responds to questions appropriately with a normal affect.   EKG:  The EKG was personally reviewed and demonstrates:  NSR 83bpm, RBBB, no prior to compare to, nonspecific TW changes, no prior to compare to  Telemetry:  Telemetry was personally reviewed and demonstrates:  NSR/sinus tach  Relevant CV Studies: 2d echo 10/17/21   1. Global hypokinesis  with akinesis of the inferolateral wall; overall  moderate LV dysfunction; calcified aortic valve with probable moderate AS  (mean gradient 12 mmHg; AVA calculated at 0.9 cm2 suggesting severe AS but  DI not significant 0.35).   2. Left ventricular ejection fraction, by estimation, is 30 to 35%. The  left ventricle has moderately decreased function. The left ventricle  demonstrates regional wall motion abnormalities (see scoring  diagram/findings for description). Left ventricular   diastolic parameters are consistent with Grade I diastolic dysfunction  (impaired relaxation).   3. Right ventricular systolic function is mildly reduced. The right  ventricular size is normal. There is mildly elevated pulmonary artery  systolic pressure.   4. The mitral valve is normal in structure. No evidence of mitral valve  regurgitation. No evidence of mitral stenosis. Severe mitral annular  calcification.   5. The aortic valve is calcified. Aortic valve regurgitation is not  visualized. Moderate aortic valve stenosis.   6. The inferior vena cava is normal in size with greater than 50%  respiratory variability, suggesting right atrial pressure of 3 mmHg.   Comparison(s): No prior Echocardiogram.   Laboratory Data:  High Sensitivity Troponin:   Recent Labs  Lab 10/15/21 0629 10/15/21 0829  TROPONINIHS 23* 21*     Chemistry Recent Labs  Lab 10/15/21 2125 10/16/21 0421 10/17/21 0357 10/18/21 0429  NA 130* 126*  125* 128* 132*  K 3.2* 3.0*  3.0* 3.6 3.8  CL 92* 92*  92* 93* 97*  CO2 _0 GLUCOSE 101* 97  97 113* 125*  BUN 17 25*  25* 23 33*  CREATININE 0.56  0.55 0.58  0.58 0.51 0.61  CALCIUM 8.4* 8.0*  7.9* 7.7* 8.3*  MG 2.2  --   --   --   GFRNONAA >60  >60 >60  >60 >60 >60  ANIONGAP _1 Recent Labs  Lab 10/15/21 0629 10/15/21 2125 10/16/21 0421  PROT 8.0  --  6.7  ALBUMIN 3.8 3.8 3.3*  3.3*  AST 24  --  21  ALT 24  --  24  ALKPHOS 55  --   51  BILITOT 0.9  --  0.9   Lipids No results for input(s): CHOL, TRIG, HDL, LABVLDL, LDLCALC, CHOLHDL in the last 168 hours.  Hematology Recent Labs  Lab 10/15/21 0629 10/15/21 2125 10/16/21 0421  WBC 13.5* 12.6* 11.1*  RBC 4.30 4.42 3.93  HGB 13.8 14.1 12.7  HCT 39.8 40.9 37.4  MCV 92.6 92.5 95.2  MCH 32.1 31.9 32.3  MCHC 34.7 34.5 34.0  RDW 14.7 14.6 14.5  PLT 345 324 302   Thyroid  Recent Labs  Lab 10/16/21 0421  TSH 0.829    BNP Recent Labs  Lab 10/15/21 0629 10/17/21 1220  BNP 991.7* 363.8*    DDimer  Recent Labs  Lab 10/15/21 0629  DDIMER 0.86*     Radiology/Studies:  DG Chest 2 View  Result Date: 10/14/2021 CLINICAL DATA:  Shortness of breath. EXAM: CHEST - 2 VIEW COMPARISON:  Chest x-ray 10/21/2009 FINDINGS: Heart is enlarged, unchanged. There is central pulmonary vascular congestion. Minimal strandy opacities in the lung bases likely represent atelectasis. There is no focal lung consolidation, pleural effusion or pneumothorax. There are surgical changes in the right shoulder. No acute fractures. IMPRESSION: 1. Cardiomegaly with central pulmonary vascular congestion. Electronically Signed   By: Ronney Asters M.D.   On: 10/14/2021 22:36   ECHOCARDIOGRAM LIMITED  Result Date: 10/17/2021    ECHOCARDIOGRAM LIMITED REPORT   Patient Name:   ANEITA KIGER Date of Exam: 10/17/2021 Medical Rec #:  [id number]      Height:       58.0 in Accession #:    [id number]     Weight:       189.6 lb Date of Birth:  09-07-1946      BSA:          1.780 m Patient Age:    37 years       BP:           143/85 mmHg Patient Gender: F              HR:           90 bpm. Exam Location:  Inpatient Procedure: Limited Echo, Limited Color Doppler and Cardiac Doppler Indications:    acute systolic chf  History:        Patient has no prior history of Echocardiogram examinations.                 Covid, Signs/Symptoms:Dyspnea; Risk Factors:Dyslipidemia.  Sonographer:    Malcolm  Referring Phys: [id number] [place]  1. Global hypokinesis with akinesis of the inferolateral wall; overall moderate LV dysfunction; calcified aortic valve with probable moderate AS (mean gradient 12 mmHg; AVA calculated at 0.9 cm2 suggesting severe AS but DI not significant 0.35).  2. Left ventricular ejection fraction, by estimation, is 30 to 35%. The left ventricle has moderately decreased function. The left ventricle demonstrates regional wall motion abnormalities (see scoring diagram/findings for description). Left ventricular  diastolic parameters are consistent with Grade I diastolic dysfunction (impaired relaxation).  3. Right ventricular systolic function is mildly reduced. The right ventricular size is normal. There is mildly elevated pulmonary artery systolic pressure.  4. The mitral valve is normal in structure. No evidence of mitral valve regurgitation. No evidence of mitral stenosis. Severe mitral annular calcification.  5. The aortic valve is calcified. Aortic valve regurgitation is not visualized. Moderate aortic valve stenosis.  6. The inferior vena cava is normal in size with  greater than 50% respiratory variability, suggesting right atrial pressure of 3 mmHg. Comparison(s): No prior Echocardiogram. FINDINGS  Left Ventricle: Left ventricular ejection fraction, by estimation, is 30 to 35%. The left ventricle has moderately decreased function. The left ventricle demonstrates regional wall motion abnormalities. The left ventricular internal cavity size was normal in size. There is no left ventricular hypertrophy. Left ventricular diastolic parameters are consistent with Grade I diastolic dysfunction (impaired relaxation). Right Ventricle: The right ventricular size is normal. Right ventricular systolic function is mildly reduced. There is mildly elevated pulmonary artery systolic pressure. The tricuspid regurgitant velocity is 2.88 m/s, and with an assumed right atrial pressure of 3  mmHg, the estimated right ventricular systolic pressure is 58.5 mmHg. Left Atrium: Left atrial size was normal in size. Right Atrium: Right atrial size was normal in size. Pericardium: There is no evidence of pericardial effusion. Mitral Valve: The mitral valve is normal in structure. Severe mitral annular calcification. No evidence of mitral valve stenosis. Tricuspid Valve: The tricuspid valve is normal in structure. Tricuspid valve regurgitation is mild . No evidence of tricuspid stenosis. Aortic Valve: The aortic valve is calcified. Aortic valve regurgitation is not visualized. Moderate aortic stenosis is present. Aortic valve mean gradient measures 11.5 mmHg. Aortic valve peak gradient measures 20.7 mmHg. Aortic valve area, by VTI measures 0.88 cm. Pulmonic Valve: The pulmonic valve was normal in structure. Pulmonic valve regurgitation is not visualized. No evidence of pulmonic stenosis. Aorta: The aortic root is normal in size and structure. Venous: The inferior vena cava is normal in size with greater than 50% respiratory variability, suggesting right atrial pressure of 3 mmHg. IAS/Shunts: No atrial level shunt detected by color flow Doppler. Additional Comments: Global hypokinesis with akinesis of the inferolateral wall; overall moderate LV dysfunction; calcified aortic valve with probable moderate AS (mean gradient 12 mmHg; AVA calculated at 0.9 cm2 suggesting severe AS but DI not significant 0.35). LEFT VENTRICLE PLAX 2D LVIDd:         4.60 cm     Diastology LVIDs:         3.40 cm     LV e' medial:    4.46 cm/s LV PW:         1.00 cm     LV E/e' medial:  13.4 LV IVS:        1.00 cm     LV e' lateral:   5.66 cm/s LVOT diam:     1.80 cm     LV E/e' lateral: 10.5 LV SV:         34 LV SV Index:   19 LVOT Area:     2.54 cm  LV Volumes (MOD) LV vol d, MOD A4C: 76.7 ml LV vol s, MOD A4C: 52.0 ml LV SV MOD A4C:     76.7 ml IVC IVC diam: 1.20 cm LEFT ATRIUM         Index LA diam:    3.60 cm 2.02 cm/m  AORTIC  VALVE AV Area (Vmax):    0.84 cm AV Area (Vmean):   0.81 cm AV Area (VTI):     0.88 cm AV Vmax:           227.50 cm/s AV Vmean:          156.500 cm/s AV VTI:            0.387 m AV Peak Grad:      20.7 mmHg AV Mean Grad:      11.5 mmHg LVOT  Vmax:         75.00 cm/s LVOT Vmean:        49.900 cm/s LVOT VTI:          0.134 m LVOT/AV VTI ratio: 0.35 MITRAL VALVE               TRICUSPID VALVE MV Area (PHT): 4.57 cm    TR Peak grad:   33.2 mmHg MV Decel Time: 166 msec    TR Vmax:        288.00 cm/s MV E velocity: 59.60 cm/s MV A velocity: 79.30 cm/s  SHUNTS MV E/A ratio:  0.75        Systemic VTI:  0.13 m                            Systemic Diam: 1.80 cm Kirk Ruths MD Electronically signed by Kirk Ruths MD Signature Date/Time: 10/17/2021/4:09:16 PM    Final      Assessment and Plan:   1. Acute hypoxic respiratory failure, suspect primarily due to new onset acute systolic CHF - clinically improving on IV diuretic therapy - hyponatremia and hypochloremia improving, BUN slightly increased this AM - have requested updated VS in EMR for this AM to help guide further recommendations - addendum - reviewed - pulse ox 92% RA, remains mildly tachycardic - question whether PE also needs to be ruled out given recent Covid, elevated d-dimer - will discuss further recs with MD - would suspect we may need to consider R/LHC this admission  2. Aortic stenosis - will review echo findings with MD  3. RBBB - no prior EKG on file, no sx of bradycardia  4. Hyponatremia - appears to be improving with diuresis  5. Morbid obesity - consider sleep study and referral to [facility] as OP - consult dietitian for education and low sodium/heart healthy counseling  6. Covid-19 infection - original infective sx started early November, but given the multitude of respiratory viruses going around it is difficult to know whether that represented the initial Covid infection or whether she picked this up at a later time - defer  therapy to IM  7. Pre-diabetes - continue to f/u as OP  8. Hyperlipidemia - check lipids in AM   Risk Assessment/Risk Scores:        New York Heart Association (NYHA) Functional Class NYHA Class III-IV on arrival        For questions or updates, please contact [facility] Please consult [email or web address] for contact info under    Signed, Charlie Pitter, PA-C  10/18/2021 11:06 AM

## 2021-10-18 NOTE — Progress Notes (Addendum)
Spoke to cath lab and have put on the cath board for [facility] tentatively on Monday with Dr. Saunders Revel. CC with scheduling in cath lab states the case is put on for 12pm but they will do end of the day as she is Covid positive.   Will need cath orders over the weekend from rounding team if she elects to stay. Will tentatively make NPO after midnight for Monday.

## 2021-10-18 NOTE — Plan of Care (Signed)
Pt given and educated on use of IS. Pt weaned to RA, sats above 93%.   Problem: Education: Goal: Knowledge of General Education information will improve Description: Including pain rating scale, medication(s)/side effects and non-pharmacologic comfort measures Outcome: Progressing   Problem: Health Behavior/Discharge Planning: Goal: Ability to manage health-related needs will improve Outcome: Progressing   Problem: Clinical Measurements: Goal: Ability to maintain clinical measurements within normal limits will improve Outcome: Progressing Goal: Will remain free from infection Outcome: Progressing Goal: Diagnostic test results will improve Outcome: Progressing Goal: Respiratory complications will improve Outcome: Progressing Goal: Cardiovascular complication will be avoided Outcome: Progressing   Problem: Activity: Goal: Risk for activity intolerance will decrease Outcome: Progressing   Problem: Nutrition: Goal: Adequate nutrition will be maintained Outcome: Progressing   Problem: Coping: Goal: Level of anxiety will decrease Outcome: Progressing   Problem: Elimination: Goal: Will not experience complications related to bowel motility Outcome: Progressing Goal: Will not experience complications related to urinary retention Outcome: Progressing   Problem: Pain Managment: Goal: General experience of comfort will improve Outcome: Progressing   Problem: Safety: Goal: Ability to remain free from injury will improve Outcome: Progressing   Problem: Skin Integrity: Goal: Risk for impaired skin integrity will decrease Outcome: Progressing   Problem: Education: Goal: Knowledge of risk factors and measures for prevention of condition will improve Outcome: Progressing   Problem: Coping: Goal: Psychosocial and spiritual needs will be supported Outcome: Progressing   Problem: Respiratory: Goal: Will maintain a patent airway Outcome: Progressing Goal: Complications  related to the disease process, condition or treatment will be avoided or minimized Outcome: Progressing   Problem: Education: Goal: Ability to demonstrate management of disease process will improve Outcome: Progressing Goal: Ability to verbalize understanding of medication therapies will improve Outcome: Progressing Goal: Individualized Educational Video(s) Outcome: Progressing   Problem: Activity: Goal: Capacity to carry out activities will improve Outcome: Progressing   Problem: Cardiac: Goal: Ability to achieve and maintain adequate cardiopulmonary perfusion will improve Outcome: Progressing

## 2021-10-18 NOTE — Progress Notes (Signed)
PROGRESS NOTE    Amy [name]  TDD:[id number] DOB: [birth year] DOA: 10/14/2021 PCP: Shirline Frees, MD   Chief Complain: Shortness of breath  Brief Narrative: Patient is a 75 year old female with history of hyperlipidemia who presented with shortness of breath that started about 3 weeks ago.  She was also complaining of cough.  Cough did not improve with outpatient medication.  She was seen in the urgent care and sent to the emergency department.  COVID screen test, today was diabetes is elevated for cardiomegaly, pulmonary vascular congestion.  Labs showed elevated BNP.  Patient was admitted for the management of suspected acute congestive heart failure.  Currently on IV Lasix.  Echocardiogram showed EF of 30 to 62%, grade 1 diastolic dysfunction, moderate to severe aortic stenosis.  These are all new findings.  No previous echocardiogram found in system.  Cardiology consulted today  Assessment & Plan:   Principal Problem:   Acute CHF (congestive heart failure) (HCC)   Acute congestive heart failure: No history of CHF.  Presented with cough ,elevated BNP.  Chest x-ray suggestive of CHF.  Started on Lasix 40 mg twice daily.   Continue to monitor daily weight, input/output. Has bibasilar crackles but no peripheral edema.  We will continue the Lasix today at current dose. Echocardiogram showed EF of 30 to 37%, grade 1 diastolic dysfunction, moderate to severe aortic stenosis.  These are all new findings.  No previous echocardiogram found in system.  Cardiology consulted today. She might need ischemic work-up  COVID-19: Symptoms started about 3 weeks ago.  No pneumonia seen on the chest x-ray.  Currently not hypoxic.  No acute indication for treatment.  Continue supportive care  Hyponatremia: Sodium in the range of 125 on presentation.  Possible hypervolemic hyponatremia from CHF.    Na has improved to the range of 130s today  Hyperlipidemia: On statin  Suspected left lower extremity  cellulitis versus venous stasis: Started on cefazolin,changed  to keflex  Hypokalemia: Currently being supplemented, monitor.  Morbid obesity: BMI of 39.6        DVT prophylaxis: Lovenox Code Status: Full code Family Communication:Called and discussed with the daughter on phone on 10/18/21 Patient status:  Dispo: The patient is from: Home               Anticipated d/c is to: Home              Anticipated d/c date is: 1-2 days.  Awaiting cardiology recommendation  Consultants: Cardiology  Procedures:None  Antimicrobials:  Anti-infectives (From admission, onward)    Start     Dose/Rate Route Frequency Ordered Stop   10/16/21 1400  cephALEXin (KEFLEX) capsule 500 mg        500 mg Oral Every 8 hours 10/16/21 1121 10/20/21 1359   10/15/21 2200  ceFAZolin (ANCEF) IVPB 1 g/50 mL premix  Status:  Discontinued        1 g 100 mL/hr over 30 Minutes Intravenous Every 8 hours 10/15/21 2040 10/16/21 1121       Subjective:  Patient seen and examined at the bedside this morning.  Hemodynamically stable.  She says she feels better.  Denies any complaints today.   Objective: Vitals:   10/17/21 1816 10/17/21 1820 10/17/21 2002 10/18/21 0500  BP: (!) 120/59  135/60   Pulse:   98   Resp: 20  18   Temp: 97.9 F (36.6 C)  98 F (36.7 C)   TempSrc: Oral  Oral   SpO2: 97% 92%  96%   Weight:    86.6 kg  Height:        Intake/Output Summary (Last 24 hours) at 10/18/2021 0827 Last data filed at 10/18/2021 0535 Gross per 24 hour  Intake 720 ml  Output 1750 ml  Net -1030 ml   Filed Weights   10/15/21 2046 10/16/21 0500 10/18/21 0500  Weight: 85.3 kg 86 kg 86.6 kg    Examination:  General exam: Overall comfortable, not in distress,obese HEENT: PERRL Respiratory system: Mild bibasilar crackles Cardiovascular system: S1 & S2 heard, RRR.  Gastrointestinal system: Abdomen is nondistended, soft and nontender. Central nervous system: Alert and oriented Extremities: No edema, no  clubbing ,no cyanosis Skin: No rashes, no ulcers,no icterus        Data Reviewed: I have personally reviewed following labs and imaging studies  CBC: Recent Labs  Lab 10/15/21 0629 10/15/21 2125 10/16/21 0421  WBC 13.5* 12.6* 11.1*  NEUTROABS 9.2*  --   --   HGB 13.8 14.1 12.7  HCT 39.8 40.9 37.4  MCV 92.6 92.5 95.2  PLT 345 324 450   Basic Metabolic Panel: Recent Labs  Lab 10/15/21 0629 10/15/21 2125 10/16/21 0421 10/17/21 0357 10/18/21 0429  NA 127* 130* 126*  125* 128* 132*  K 3.2* 3.2* 3.0*  3.0* 3.6 3.8  CL 90* 92* 92*  92* 93* 97*  CO2 25 26 26  26 26 27   GLUCOSE 118* 101* 97  97 113* 125*  BUN 17 17 25*  25* 23 33*  CREATININE 0.56 0.56  0.55 0.58  0.58 0.51 0.61  CALCIUM 8.6* 8.4* 8.0*  7.9* 7.7* 8.3*  MG  --  2.2  --   --   --   PHOS  --  3.4 4.0  --   --    GFR: Estimated Creatinine Clearance: 57.7 mL/min (by C-G formula based on SCr of 0.61 mg/dL). Liver Function Tests: Recent Labs  Lab 10/15/21 0629 10/15/21 2125 10/16/21 0421  AST 24  --  21  ALT 24  --  24  ALKPHOS 55  --  51  BILITOT 0.9  --  0.9  PROT 8.0  --  6.7  ALBUMIN 3.8 3.8 3.3*  3.3*   No results for input(s): LIPASE, AMYLASE in the last 168 hours. No results for input(s): AMMONIA in the last 168 hours. Coagulation Profile: No results for input(s): INR, PROTIME in the last 168 hours. Cardiac Enzymes: No results for input(s): CKTOTAL, CKMB, CKMBINDEX, TROPONINI in the last 168 hours. BNP (last 3 results) No results for input(s): PROBNP in the last 8760 hours. HbA1C: Recent Labs    10/15/21 2125  HGBA1C 6.3*   CBG: No results for input(s): GLUCAP in the last 168 hours. Lipid Profile: No results for input(s): CHOL, HDL, LDLCALC, TRIG, CHOLHDL, LDLDIRECT in the last 72 hours. Thyroid Function Tests: Recent Labs    10/16/21 0421  TSH 0.829   Anemia Panel: No results for input(s): VITAMINB12, FOLATE, FERRITIN, TIBC, IRON, RETICCTPCT in the last 72  hours. Sepsis Labs: No results for input(s): PROCALCITON, LATICACIDVEN in the last 168 hours.  Recent Results (from the past 240 hour(s))  Resp Panel by RT-PCR (Flu A&B, Covid) Nasopharyngeal Swab     Status: Abnormal   Collection Time: 10/14/21 11:53 PM   Specimen: Nasopharyngeal Swab; Nasopharyngeal(NP) swabs in vial transport medium  Result Value Ref Range Status   SARS Coronavirus 2 by RT PCR POSITIVE (A) NEGATIVE Final    Comment: CRITICAL RESULT CALLED TO,  READ BACK BY AND VERIFIED WITH: TJ RIVERS RN 10/15/21 @ 0107 VS (NOTE) SARS-CoV-2 target nucleic acids are DETECTED.  The SARS-CoV-2 RNA is generally detectable in upper respiratory specimens during the acute phase of infection. Positive results are indicative of the presence of the identified virus, but do not rule out bacterial infection or co-infection with other pathogens not detected by the test. Clinical correlation with patient history and other diagnostic information is necessary to determine patient infection status. The expected result is Negative.  Fact Sheet for Patients: EntrepreneurPulse.com.au  Fact Sheet for Healthcare Providers: IncredibleEmployment.be  This test is not yet approved or cleared by the [place] FDA and  has been authorized for detection and/or diagnosis of SARS-CoV-2 by FDA under an Emergency Use Authorization (EUA).  This EUA will remain in effect (meaning this test  can be used) for the duration of  the COVID-19 declaration under Section 564(b)(1) of the Act, 21 U.S.C. section 360bbb-3(b)(1), unless the authorization is terminated or revoked sooner.     Influenza A by PCR NEGATIVE NEGATIVE Final   Influenza B by PCR NEGATIVE NEGATIVE Final    Comment: (NOTE) The Xpert Xpress SARS-CoV-2/FLU/RSV plus assay is intended as an aid in the diagnosis of influenza from Nasopharyngeal swab specimens and should not be used as a sole basis for treatment.  Nasal washings and aspirates are unacceptable for Xpert Xpress SARS-CoV-2/FLU/RSV testing.  Fact Sheet for Patients: EntrepreneurPulse.com.au  Fact Sheet for Healthcare Providers: IncredibleEmployment.be  This test is not yet approved or cleared by the [place] FDA and has been authorized for detection and/or diagnosis of SARS-CoV-2 by FDA under an Emergency Use Authorization (EUA). This EUA will remain in effect (meaning this test can be used) for the duration of the COVID-19 declaration under Section 564(b)(1) of the Act, 21 U.S.C. section 360bbb-3(b)(1), unless the authorization is terminated or revoked.  Performed at [facility], [place] [address], [place], [place] [address]   Culture, blood (routine x 2)     Status: None (Preliminary result)   Collection Time: 10/15/21  9:25 PM   Specimen: BLOOD  Result Value Ref Range Status   Specimen Description   Final    BLOOD LEFT ANTECUBITAL Performed at [facility] [address], [place], [place] [address]    Special Requests   Final    BOTTLES DRAWN AEROBIC ONLY Blood Culture adequate volume Performed at [facility] [address] [place], [place] [address]    Culture   Final    NO GROWTH 2 DAYS Performed at [facility] [address] [place], [place] [address]    Report Status PENDING  Incomplete  Culture, blood (routine x 2)     Status: None (Preliminary result)   Collection Time: 10/15/21  9:25 PM   Specimen: BLOOD RIGHT HAND  Result Value Ref Range Status   Specimen Description   Final    BLOOD RIGHT HAND Performed at [facility] [address], [place], [place] [address]    Special Requests   Final    BOTTLES DRAWN AEROBIC ONLY Blood Culture adequate volume Performed at [facility] [address], [place], [place] [address]    Culture   Final    NO GROWTH 2  DAYS Performed at [facility] [address] [place],  [address]    Report Status PENDING  Incomplete         Radiology Studies: ECHOCARDIOGRAM  LIMITED  Result Date: 10/17/2021    ECHOCARDIOGRAM LIMITED REPORT   Patient Name:   ELLICE BOULTINGHOUSE Date of Exam: 10/17/2021 Medical Rec #:  [id number]      Height:       58.0 in Accession #:    [id number]     Weight:       189.6 lb Date of Birth:  08/30/1946      BSA:          1.780 m Patient Age:    7 years       BP:           143/85 mmHg Patient Gender: F              HR:           90 bpm. Exam Location:  Inpatient Procedure: Limited Echo, Limited Color Doppler and Cardiac Doppler Indications:    acute systolic chf  History:        Patient has no prior history of Echocardiogram examinations.                 Covid, Signs/Symptoms:Dyspnea; Risk Factors:Dyslipidemia.  Sonographer:    Jasper Referring Phys: [id number] [place]  1. Global hypokinesis with akinesis of the inferolateral wall; overall moderate LV dysfunction; calcified aortic valve with probable moderate AS (mean gradient 12 mmHg; AVA calculated at 0.9 cm2 suggesting severe AS but DI not significant 0.35).  2. Left ventricular ejection fraction, by estimation, is 30 to 35%. The left ventricle has moderately decreased function. The left ventricle demonstrates regional wall motion abnormalities (see scoring diagram/findings for description). Left ventricular  diastolic parameters are consistent with Grade I diastolic dysfunction (impaired relaxation).  3. Right ventricular systolic function is mildly reduced. The right ventricular size is normal. There is mildly elevated pulmonary artery systolic pressure.  4. The mitral valve is normal in structure. No evidence of mitral valve regurgitation. No evidence of mitral stenosis. Severe mitral annular calcification.  5. The aortic valve is calcified. Aortic valve regurgitation is not visualized. Moderate aortic  valve stenosis.  6. The inferior vena cava is normal in size with greater than 50% respiratory variability, suggesting right atrial pressure of 3 mmHg. Comparison(s): No prior Echocardiogram. FINDINGS  Left Ventricle: Left ventricular ejection fraction, by estimation, is 30 to 35%. The left ventricle has moderately decreased function. The left ventricle demonstrates regional wall motion abnormalities. The left ventricular internal cavity size was normal in size. There is no left ventricular hypertrophy. Left ventricular diastolic parameters are consistent with Grade I diastolic dysfunction (impaired relaxation). Right Ventricle: The right ventricular size is normal. Right ventricular systolic function is mildly reduced. There is mildly elevated pulmonary artery systolic pressure. The tricuspid regurgitant velocity is 2.88 m/s, and with an assumed right atrial pressure of 3 mmHg, the estimated right ventricular systolic pressure is 47.6 mmHg. Left Atrium: Left atrial size was normal in size. Right Atrium: Right atrial size was normal in size. Pericardium: There is no evidence of pericardial effusion. Mitral Valve: The mitral valve is normal in structure. Severe mitral annular calcification. No evidence of mitral valve stenosis. Tricuspid Valve: The tricuspid valve is normal in structure. Tricuspid valve regurgitation is mild . No evidence of tricuspid stenosis. Aortic Valve: The aortic valve is calcified. Aortic valve regurgitation is not visualized. Moderate aortic stenosis is present. Aortic valve mean gradient measures 11.5 mmHg. Aortic valve peak gradient measures 20.7 mmHg. Aortic valve area, by VTI measures 0.88 cm. Pulmonic  Valve: The pulmonic valve was normal in structure. Pulmonic valve regurgitation is not visualized. No evidence of pulmonic stenosis. Aorta: The aortic root is normal in size and structure. Venous: The inferior vena cava is normal in size with greater than 50% respiratory variability,  suggesting right atrial pressure of 3 mmHg. IAS/Shunts: No atrial level shunt detected by color flow Doppler. Additional Comments: Global hypokinesis with akinesis of the inferolateral wall; overall moderate LV dysfunction; calcified aortic valve with probable moderate AS (mean gradient 12 mmHg; AVA calculated at 0.9 cm2 suggesting severe AS but DI not significant 0.35). LEFT VENTRICLE PLAX 2D LVIDd:         4.60 cm     Diastology LVIDs:         3.40 cm     LV e' medial:    4.46 cm/s LV PW:         1.00 cm     LV E/e' medial:  13.4 LV IVS:        1.00 cm     LV e' lateral:   5.66 cm/s LVOT diam:     1.80 cm     LV E/e' lateral: 10.5 LV SV:         34 LV SV Index:   19 LVOT Area:     2.54 cm  LV Volumes (MOD) LV vol d, MOD A4C: 76.7 ml LV vol s, MOD A4C: 52.0 ml LV SV MOD A4C:     76.7 ml IVC IVC diam: 1.20 cm LEFT ATRIUM         Index LA diam:    3.60 cm 2.02 cm/m  AORTIC VALVE AV Area (Vmax):    0.84 cm AV Area (Vmean):   0.81 cm AV Area (VTI):     0.88 cm AV Vmax:           227.50 cm/s AV Vmean:          156.500 cm/s AV VTI:            0.387 m AV Peak Grad:      20.7 mmHg AV Mean Grad:      11.5 mmHg LVOT Vmax:         75.00 cm/s LVOT Vmean:        49.900 cm/s LVOT VTI:          0.134 m LVOT/AV VTI ratio: 0.35 MITRAL VALVE               TRICUSPID VALVE MV Area (PHT): 4.57 cm    TR Peak grad:   33.2 mmHg MV Decel Time: 166 msec    TR Vmax:        288.00 cm/s MV E velocity: 59.60 cm/s MV A velocity: 79.30 cm/s  SHUNTS MV E/A ratio:  0.75        Systemic VTI:  0.13 m                            Systemic Diam: 1.80 cm Kirk Ruths MD Electronically signed by Kirk Ruths MD Signature Date/Time: 10/17/2021/4:09:16 PM    Final         Scheduled Meds:  cephALEXin  500 mg Oral Q8H   enoxaparin (LOVENOX) injection  40 mg Subcutaneous Q24H   furosemide  40 mg Intravenous BID   guaiFENesin  600 mg Oral BID   potassium chloride  40 mEq Oral Daily   Continuous Infusions:     LOS: 3  days    Time  spent: 25 mins.More than 50% of that time was spent in counseling and/or coordination of care.      Shelly Coss, MD [facility] P12/12/2020, 8:27 AM

## 2021-10-18 NOTE — Care Management Important Message (Signed)
Important Message  Patient Details IM Letter given to the Patient. Name: Amy [name] MRN: [id number] Date of Birth: Jan 11, 1946   Medicare Important Message Given:  Yes     Kerin Salen 10/18/2021, 11:25 AM

## 2021-10-19 DIAGNOSIS — I509 Heart failure, unspecified: Secondary | ICD-10-CM | POA: Diagnosis not present

## 2021-10-19 LAB — LIPID PANEL
Cholesterol: 177 mg/dL (ref 0–200)
HDL: 36 mg/dL — ABNORMAL LOW (ref >40)
LDL Cholesterol: 120 mg/dL — ABNORMAL HIGH (ref 0–99)
Total CHOL/HDL Ratio: 4.9 RATIO
Triglycerides: 104 mg/dL (ref <150)
VLDL: 21 mg/dL (ref 0–40)

## 2021-10-19 LAB — CBC
HCT: 42.8 % (ref 36.0–46.0)
Hemoglobin: 14.1 g/dL (ref 12.0–15.0)
MCH: 31.3 pg (ref 26.0–34.0)
MCHC: 32.9 g/dL (ref 30.0–36.0)
MCV: 95.1 fL (ref 80.0–100.0)
Platelets: 330 10*3/uL (ref 150–400)
RBC: 4.5 MIL/uL (ref 3.87–5.11)
RDW: 14.7 % (ref 11.5–15.5)
WBC: 9.7 10*3/uL (ref 4.0–10.5)
nRBC: 0 % (ref 0.0–0.2)

## 2021-10-19 LAB — BASIC METABOLIC PANEL
Anion gap: 3 — ABNORMAL LOW (ref 5–15)
BUN: 28 mg/dL — ABNORMAL HIGH (ref 8–23)
CO2: 29 mmol/L (ref 22–32)
Calcium: 8.2 mg/dL — ABNORMAL LOW (ref 8.9–10.3)
Chloride: 99 mmol/L (ref 98–111)
Creatinine, Ser: 0.63 mg/dL (ref 0.44–1.00)
GFR, Estimated: >60 mL/min (ref >60)
Glucose, Bld: 103 mg/dL — ABNORMAL HIGH (ref 70–99)
Potassium: 4.3 mmol/L (ref 3.5–5.1)
Sodium: 131 mmol/L — ABNORMAL LOW (ref 135–145)

## 2021-10-19 MED ORDER — DICLOFENAC SODIUM 1 % EX GEL
4.0000 g | Freq: Four times a day (QID) | CUTANEOUS | Status: DC
Start: 2021-10-19 — End: 2021-10-23
  Administered 2021-10-19 – 2021-10-23 (×10): 4 g via TOPICAL
  Filled 2021-10-19 (×2): qty 100

## 2021-10-19 MED ORDER — MECLIZINE HCL 25 MG PO TABS
25.0000 mg | ORAL_TABLET | Freq: Three times a day (TID) | ORAL | Status: DC | PRN
  Administered 2021-10-20 – 2021-10-23 (×4): 25 mg via ORAL
  Filled 2021-10-19 (×5): qty 1

## 2021-10-19 MED ORDER — ROSUVASTATIN CALCIUM 5 MG PO TABS: 5.0000 mg | ORAL_TABLET | Freq: Every day | ORAL | Status: DC

## 2021-10-19 MED ORDER — POTASSIUM CHLORIDE CRYS ER 20 MEQ PO TBCR
20.0000 meq | EXTENDED_RELEASE_TABLET | Freq: Every day | ORAL | Status: DC
  Administered 2021-10-19 – 2021-10-23 (×5): 20 meq via ORAL
  Filled 2021-10-19 (×5): qty 1

## 2021-10-19 NOTE — Progress Notes (Signed)
PROGRESS NOTE    Amy [name]  WER:[id number] DOB: [birth year] DOA: 10/14/2021 PCP: Shirline Frees, MD   Chief Complain: Shortness of breath  Brief Narrative: Patient is a 75 year old female with history of hyperlipidemia who presented with shortness of breath that started about 3 weeks ago.  She was also complaining of cough.  Cough did not improve with outpatient medication.  She was seen in the urgent care and sent to the emergency department.  COVID screen test, today was diabetes is elevated for cardiomegaly, pulmonary vascular congestion.  Labs showed elevated BNP.  Patient was admitted for the management of suspected acute congestive heart failure.  Currently on IV Lasix.  Echocardiogram showed EF of 30 to 19%, grade 1 diastolic dysfunction, moderate to severe aortic stenosis.  These are all new findings.  No previous echocardiogram found in system.  Cardiology consulted , plan for cath on Monday.  Assessment & Plan:   Principal Problem:   Acute CHF (congestive heart failure) (HCC)   Acute congestive heart failure: No history of CHF.  Presented with cough ,elevated BNP.  Chest x-ray suggestive of CHF.  Started on Lasix 40 mg twice daily.   Continue to monitor daily weight, input/output. Has bibasilar crackles but no peripheral edema. Echocardiogram showed EF of 30 to 50%, grade 1 diastolic dysfunction, moderate to severe aortic stenosis.  These are all new findings.  No previous echocardiogram found in system.  Cardiology consulted . Planning for cardiac cath on Monday. Currently on torsemide. She might need ischemic work-up  COVID-19: Symptoms started about 3 weeks ago.  No pneumonia seen on the chest x-ray.  Currently not hypoxic.  No acute indication for treatment.  Continue supportive care  Hyponatremia: Sodium in the range of 125 on presentation.  Possible hypervolemic hyponatremia from CHF.    Na has improved to the range of 130s today  Hyperlipidemia: On statin.  LDL of  120  Suspected left lower extremity cellulitis versus venous stasis: Started on cefazolin,changed  to keflex  Prediabetes: Hemoglobin A1c of 6.3.  Advised lifestyle changes and dietary modification  Morbid obesity: BMI of 39.6  Weakness: Patient has been complaining of exertional dyspnea/weakness.  We will request for physical therapy evaluation        DVT prophylaxis: Lovenox Code Status: Full code Family Communication:Called and discussed with the daughter on phone on 10/18/21 Patient status:  Dispo: The patient is from: Home               Anticipated d/c is to: Home              Anticipated d/c date is:2-3 days.  Awaiting cath  Consultants: Cardiology  Procedures:None  Antimicrobials:  Anti-infectives (From admission, onward)    Start     Dose/Rate Route Frequency Ordered Stop   10/16/21 1400  cephALEXin (KEFLEX) capsule 500 mg        500 mg Oral Every 8 hours 10/16/21 1121 10/20/21 1359   10/15/21 2200  ceFAZolin (ANCEF) IVPB 1 g/50 mL premix  Status:  Discontinued        1 g 100 mL/hr over 30 Minutes Intravenous Every 8 hours 10/15/21 2040 10/16/21 1121       Subjective:  Patient seen and examined at the bedside this morning.  Lying on bed.  I encouraged her to sit in the chair and ambulate.  Denies any new complaints.  She was on room air  Objective: Vitals:   10/18/21 1400 10/18/21 2007 10/19/21 0500 10/19/21 0539  BP:  118/72  132/87  Pulse:  92  91  Resp:  18  15  Temp:  98.4 F (36.9 C)  98.4 F (36.9 C)  TempSrc:  Oral  Oral  SpO2: 94% 92%  90%  Weight:   84.6 kg   Height:        Intake/Output Summary (Last 24 hours) at 10/19/2021 0727 Last data filed at 10/19/2021 0559 Gross per 24 hour  Intake 360 ml  Output 1300 ml  Net -940 ml   Filed Weights   10/16/21 0500 10/18/21 0500 10/19/21 0500  Weight: 86 kg 86.6 kg 84.6 kg    Examination:  General exam: Overall comfortable, not in distress,obese HEENT: PERRL Respiratory system: No  crackles or wheezing Cardiovascular system: S1 & S2 heard, RRR.  Gastrointestinal system: Abdomen is nondistended, soft and nontender. Central nervous system: Alert and oriented Extremities: No edema, no clubbing ,no cyanosis Skin: No rashes, no ulcers,no icterus        Data Reviewed: I have personally reviewed following labs and imaging studies  CBC: Recent Labs  Lab 10/15/21 0629 10/15/21 2125 10/16/21 0421 10/19/21 0503  WBC 13.5* 12.6* 11.1* 9.7  NEUTROABS 9.2*  --   --   --   HGB 13.8 14.1 12.7 14.1  HCT 39.8 40.9 37.4 42.8  MCV 92.6 92.5 95.2 95.1  PLT 345 324 302 706   Basic Metabolic Panel: Recent Labs  Lab 10/15/21 2125 10/16/21 0421 10/17/21 0357 10/18/21 0429 10/19/21 0503  NA 130* 126*  125* 128* 132* 131*  K 3.2* 3.0*  3.0* 3.6 3.8 4.3  CL 92* 92*  92* 93* 97* 99  CO2 26 26  26 26 27 29   GLUCOSE 101* 97  97 113* 125* 103*  BUN 17 25*  25* 23 33* 28*  CREATININE 0.56  0.55 0.58  0.58 0.51 0.61 0.63  CALCIUM 8.4* 8.0*  7.9* 7.7* 8.3* 8.2*  MG 2.2  --   --   --   --   PHOS 3.4 4.0  --   --   --    GFR: Estimated Creatinine Clearance: 56.9 mL/min (by C-G formula based on SCr of 0.63 mg/dL). Liver Function Tests: Recent Labs  Lab 10/15/21 0629 10/15/21 2125 10/16/21 0421  AST 24  --  21  ALT 24  --  24  ALKPHOS 55  --  51  BILITOT 0.9  --  0.9  PROT 8.0  --  6.7  ALBUMIN 3.8 3.8 3.3*  3.3*   No results for input(s): LIPASE, AMYLASE in the last 168 hours. No results for input(s): AMMONIA in the last 168 hours. Coagulation Profile: No results for input(s): INR, PROTIME in the last 168 hours. Cardiac Enzymes: No results for input(s): CKTOTAL, CKMB, CKMBINDEX, TROPONINI in the last 168 hours. BNP (last 3 results) No results for input(s): PROBNP in the last 8760 hours. HbA1C: No results for input(s): HGBA1C in the last 72 hours.  CBG: No results for input(s): GLUCAP in the last 168 hours. Lipid Profile: Recent Labs     10/19/21 0503  CHOL 177  HDL 36*  LDLCALC 120*  TRIG 104  CHOLHDL 4.9   Thyroid Function Tests: No results for input(s): TSH, T4TOTAL, FREET4, T3FREE, THYROIDAB in the last 72 hours.  Anemia Panel: No results for input(s): VITAMINB12, FOLATE, FERRITIN, TIBC, IRON, RETICCTPCT in the last 72 hours. Sepsis Labs: No results for input(s): PROCALCITON, LATICACIDVEN in the last 168 hours.  Recent Results (from the past 240  hour(s))  Resp Panel by RT-PCR (Flu A&B, Covid) Nasopharyngeal Swab     Status: Abnormal   Collection Time: 10/14/21 11:53 PM   Specimen: Nasopharyngeal Swab; Nasopharyngeal(NP) swabs in vial transport medium  Result Value Ref Range Status   SARS Coronavirus 2 by RT PCR POSITIVE (A) NEGATIVE Final    Comment: CRITICAL RESULT CALLED TO, READ BACK BY AND VERIFIED WITH: TJ RIVERS RN 10/15/21 @ 0107 VS (NOTE) SARS-CoV-2 target nucleic acids are DETECTED.  The SARS-CoV-2 RNA is generally detectable in upper respiratory specimens during the acute phase of infection. Positive results are indicative of the presence of the identified virus, but do not rule out bacterial infection or co-infection with other pathogens not detected by the test. Clinical correlation with patient history and other diagnostic information is necessary to determine patient infection status. The expected result is Negative.  Fact Sheet for Patients: EntrepreneurPulse.com.au  Fact Sheet for Healthcare Providers: IncredibleEmployment.be  This test is not yet approved or cleared by the [place] FDA and  has been authorized for detection and/or diagnosis of SARS-CoV-2 by FDA under an Emergency Use Authorization (EUA).  This EUA will remain in effect (meaning this test  can be used) for the duration of  the COVID-19 declaration under Section 564(b)(1) of the Act, 21 U.S.C. section 360bbb-3(b)(1), unless the authorization is terminated or revoked sooner.      Influenza A by PCR NEGATIVE NEGATIVE Final   Influenza B by PCR NEGATIVE NEGATIVE Final    Comment: (NOTE) The Xpert Xpress SARS-CoV-2/FLU/RSV plus assay is intended as an aid in the diagnosis of influenza from Nasopharyngeal swab specimens and should not be used as a sole basis for treatment. Nasal washings and aspirates are unacceptable for Xpert Xpress SARS-CoV-2/FLU/RSV testing.  Fact Sheet for Patients: EntrepreneurPulse.com.au  Fact Sheet for Healthcare Providers: IncredibleEmployment.be  This test is not yet approved or cleared by the [place] FDA and has been authorized for detection and/or diagnosis of SARS-CoV-2 by FDA under an Emergency Use Authorization (EUA). This EUA will remain in effect (meaning this test can be used) for the duration of the COVID-19 declaration under Section 564(b)(1) of the Act, 21 U.S.C. section 360bbb-3(b)(1), unless the authorization is terminated or revoked.  Performed at [facility], [place] [address] [place], [place] [address]   Culture, blood (routine x 2)     Status: None (Preliminary result)   Collection Time: 10/15/21  9:25 PM   Specimen: BLOOD  Result Value Ref Range Status   Specimen Description   Final    BLOOD LEFT ANTECUBITAL Performed at [facility] [address] [place], [place] [address]    Special Requests   Final    BOTTLES DRAWN AEROBIC ONLY Blood Culture adequate volume Performed at [facility] [address] [place], [place] [address]    Culture   Final    NO GROWTH 2 DAYS Performed at [facility] [address] [place], [place] [address]    Report Status PENDING  Incomplete  Culture, blood (routine x 2)     Status: None (Preliminary result)   Collection Time: 10/15/21  9:25 PM   Specimen: BLOOD RIGHT HAND  Result Value Ref Range Status   Specimen Description   Final    BLOOD RIGHT HAND Performed  at [facility] [address] [place], [place] [address]    Special Requests   Final    BOTTLES DRAWN AEROBIC ONLY Blood Culture  adequate volume Performed at [facility] [address] [place], [place] [address]    Culture   Final    NO GROWTH 2 DAYS Performed at [facility] [address], [place], [place] [address]    Report Status PENDING  Incomplete         Radiology Studies: ECHOCARDIOGRAM LIMITED  Result Date: 10/17/2021    ECHOCARDIOGRAM LIMITED REPORT   Patient Name:   TAMURA LASKY Date of Exam: 10/17/2021 Medical Rec #:  [id number]      Height:       58.0 in Accession #:    [id number]     Weight:       189.6 lb Date of Birth:  Apr 07, 1946      BSA:          1.780 m Patient Age:    26 years       BP:           143/85 mmHg Patient Gender: F              HR:           90 bpm. Exam Location:  Inpatient Procedure: Limited Echo, Limited Color Doppler and Cardiac Doppler Indications:    acute systolic chf  History:        Patient has no prior history of Echocardiogram examinations.                 Covid, Signs/Symptoms:Dyspnea; Risk Factors:Dyslipidemia.  Sonographer:    [place] Referring Phys: [id number] [place]  1. Global hypokinesis with akinesis of the inferolateral wall; overall moderate LV dysfunction; calcified aortic valve with probable moderate AS (mean gradient 12 mmHg; AVA calculated at 0.9 cm2 suggesting severe AS but DI not significant 0.35).  2. Left ventricular ejection fraction, by estimation, is 30 to 35%. The left ventricle has moderately decreased function. The left ventricle demonstrates regional wall motion abnormalities (see scoring diagram/findings for description). Left ventricular  diastolic parameters are consistent with Grade I diastolic dysfunction (impaired relaxation).  3. Right ventricular systolic function is mildly reduced. The right ventricular size is normal. There is mildly  elevated pulmonary artery systolic pressure.  4. The mitral valve is normal in structure. No evidence of mitral valve regurgitation. No evidence of mitral stenosis. Severe mitral annular calcification.  5. The aortic valve is calcified. Aortic valve regurgitation is not visualized. Moderate aortic valve stenosis.  6. The inferior vena cava is normal in size with greater than 50% respiratory variability, suggesting right atrial pressure of 3 mmHg. Comparison(s): No prior Echocardiogram. FINDINGS  Left Ventricle: Left ventricular ejection fraction, by estimation, is 30 to 35%. The left ventricle has moderately decreased function. The left ventricle demonstrates regional wall motion abnormalities. The left ventricular internal cavity size was normal in size. There is no left ventricular hypertrophy. Left ventricular diastolic parameters are consistent with Grade I diastolic dysfunction (impaired relaxation). Right Ventricle: The right ventricular size is normal. Right ventricular systolic function is mildly reduced. There is mildly elevated pulmonary artery systolic pressure. The tricuspid regurgitant velocity is 2.88 m/s, and with an assumed right atrial pressure of 3 mmHg, the estimated right ventricular systolic pressure is 71.6 mmHg. Left Atrium: Left atrial size was normal in size. Right Atrium: Right atrial size was normal in size. Pericardium: There is no evidence of pericardial effusion. Mitral Valve: The mitral valve is normal in structure. Severe mitral annular calcification. No evidence of mitral valve stenosis.  Tricuspid Valve: The tricuspid valve is normal in structure. Tricuspid valve regurgitation is mild . No evidence of tricuspid stenosis. Aortic Valve: The aortic valve is calcified. Aortic valve regurgitation is not visualized. Moderate aortic stenosis is present. Aortic valve mean gradient measures 11.5 mmHg. Aortic valve peak gradient measures 20.7 mmHg. Aortic valve area, by VTI measures 0.88 cm.  Pulmonic Valve: The pulmonic valve was normal in structure. Pulmonic valve regurgitation is not visualized. No evidence of pulmonic stenosis. Aorta: The aortic root is normal in size and structure. Venous: The inferior vena cava is normal in size with greater than 50% respiratory variability, suggesting right atrial pressure of 3 mmHg. IAS/Shunts: No atrial level shunt detected by color flow Doppler. Additional Comments: Global hypokinesis with akinesis of the inferolateral wall; overall moderate LV dysfunction; calcified aortic valve with probable moderate AS (mean gradient 12 mmHg; AVA calculated at 0.9 cm2 suggesting severe AS but DI not significant 0.35). LEFT VENTRICLE PLAX 2D LVIDd:         4.60 cm     Diastology LVIDs:         3.40 cm     LV e' medial:    4.46 cm/s LV PW:         1.00 cm     LV E/e' medial:  13.4 LV IVS:        1.00 cm     LV e' lateral:   5.66 cm/s LVOT diam:     1.80 cm     LV E/e' lateral: 10.5 LV SV:         34 LV SV Index:   19 LVOT Area:     2.54 cm  LV Volumes (MOD) LV vol d, MOD A4C: 76.7 ml LV vol s, MOD A4C: 52.0 ml LV SV MOD A4C:     76.7 ml IVC IVC diam: 1.20 cm LEFT ATRIUM         Index LA diam:    3.60 cm 2.02 cm/m  AORTIC VALVE AV Area (Vmax):    0.84 cm AV Area (Vmean):   0.81 cm AV Area (VTI):     0.88 cm AV Vmax:           227.50 cm/s AV Vmean:          156.500 cm/s AV VTI:            0.387 m AV Peak Grad:      20.7 mmHg AV Mean Grad:      11.5 mmHg LVOT Vmax:         75.00 cm/s LVOT Vmean:        49.900 cm/s LVOT VTI:          0.134 m LVOT/AV VTI ratio: 0.35 MITRAL VALVE               TRICUSPID VALVE MV Area (PHT): 4.57 cm    TR Peak grad:   33.2 mmHg MV Decel Time: 166 msec    TR Vmax:        288.00 cm/s MV E velocity: 59.60 cm/s MV A velocity: 79.30 cm/s  SHUNTS MV E/A ratio:  0.75        Systemic VTI:  0.13 m                            Systemic Diam: 1.80 cm Kirk Ruths MD Electronically signed by Kirk Ruths MD Signature Date/Time: 10/17/2021/4:09:16 PM     Final  Scheduled Meds:  cephALEXin  500 mg Oral Q8H   enoxaparin (LOVENOX) injection  40 mg Subcutaneous Q24H   guaiFENesin  600 mg Oral BID   metoprolol succinate  12.5 mg Oral Daily   potassium chloride  40 mEq Oral Daily   rosuvastatin  5 mg Oral Daily   torsemide  20 mg Oral Daily   Continuous Infusions:     LOS: 4 days    Time spent: 25 mins.More than 50% of that time was spent in counseling and/or coordination of care.      Shelly Coss, MD [facility] P12/01/2021, 7:27 AM

## 2021-10-19 NOTE — Progress Notes (Signed)
Progress Note  Patient Name: Amy [name] Date of Encounter: 10/19/2021  [facility] Cardiologist: NEW  Subjective   Breathing is OK   NO CP  NEver had CP    Inpatient Medications    Scheduled Meds:  cephALEXin  500 mg Oral Q8H   enoxaparin (LOVENOX) injection  40 mg Subcutaneous Q24H   guaiFENesin  600 mg Oral BID   metoprolol succinate  12.5 mg Oral Daily   potassium chloride  20 mEq Oral Daily   torsemide  20 mg Oral Daily   Continuous Infusions:   Vital Signs    Vitals:   10/18/21 1400 10/18/21 2007 10/19/21 0500 10/19/21 0539  BP:  118/72  132/87  Pulse:  92  91  Resp:  18  15  Temp:  98.4 F (36.9 C)  98.4 F (36.9 C)  TempSrc:  Oral  Oral  SpO2: 94% 92%  90%  Weight:   84.6 kg   Height:        Intake/Output Summary (Last 24 hours) at 10/19/2021 0746 Last data filed at 10/19/2021 0559 Gross per 24 hour  Intake 360 ml  Output 1300 ml  Net -940 ml   Last 3 Weights 10/19/2021 10/18/2021 10/16/2021  Weight (lbs) 186 lb 8.2 oz 190 lb 14.7 oz 189 lb 9.5 oz  Weight (kg) 84.6 kg 86.6 kg 86 kg      Telemetry    SR  - Personally Reviewed  ECG    No new   - Personally Reviewed  Physical Exam   GEN:  Obese 75 yo  No acute distress.   Neck: No JVD Cardiac: RRR, Gr III/VI systolic murmur at base    Respiratory: Clear to auscultation bilaterally. GI: Soft, nontender, non-distended  MS: No edema; No deformity. Neuro:  Nonfocal  Psych: Normal affect   Labs    High Sensitivity Troponin:   Recent Labs  Lab 10/15/21 0629 10/15/21 0829  TROPONINIHS 23* 21*     Chemistry Recent Labs  Lab 10/15/21 0629 10/15/21 2125 10/16/21 0421 10/17/21 0357 10/18/21 0429 10/19/21 0503  NA 127* 130* 126*  125* 128* 132* 131*  K 3.2* 3.2* 3.0*  3.0* 3.6 3.8 4.3  CL 90* 92* 92*  92* 93* 97* 99  CO2 25 26 26  26 26 27 29   GLUCOSE 118* 101* 97  97 113* 125* 103*  BUN 17 17 25*  25* 23 33* 28*  CREATININE 0.56 0.56  0.55 0.58  0.58 0.51 0.61 0.63   CALCIUM 8.6* 8.4* 8.0*  7.9* 7.7* 8.3* 8.2*  MG  --  2.2  --   --   --   --   PROT 8.0  --  6.7  --   --   --   ALBUMIN 3.8 3.8 3.3*  3.3*  --   --   --   AST 24  --  21  --   --   --   ALT 24  --  24  --   --   --   ALKPHOS 55  --  51  --   --   --   BILITOT 0.9  --  0.9  --   --   --   GFRNONAA >60 >60  >60 >60  >60 >60 >60 >60  ANIONGAP 12 12 8  7 9 8  3*    Lipids  Recent Labs  Lab 10/19/21 0503  CHOL 177  TRIG 104  HDL 36*  LDLCALC 120*  CHOLHDL 4.9    Hematology Recent Labs  Lab 10/15/21 2125 10/16/21 0421 10/19/21 0503  WBC 12.6* 11.1* 9.7  RBC 4.42 3.93 4.50  HGB 14.1 12.7 14.1  HCT 40.9 37.4 42.8  MCV 92.5 95.2 95.1  MCH 31.9 32.3 31.3  MCHC 34.5 34.0 32.9  RDW 14.6 14.5 14.7  PLT 324 302 330   Thyroid  Recent Labs  Lab 10/16/21 0421  TSH 0.829    BNP Recent Labs  Lab 10/15/21 0629 10/17/21 1220  BNP 991.7* 363.8*    DDimer  Recent Labs  Lab 10/15/21 0629  DDIMER 0.86*     Radiology    ECHOCARDIOGRAM LIMITED  Result Date: 10/17/2021    ECHOCARDIOGRAM LIMITED REPORT   Patient Name:   [name] Date of Exam: 10/17/2021 Medical Rec #:  [id number]      Height:       58.0 in Accession #:    [id number]     Weight:       189.6 lb Date of Birth:  08/31/1946      BSA:          1.780 m Patient Age:    75 years       BP:           143/85 mmHg Patient Gender: F              HR:           90 bpm. Exam Location:  Inpatient Procedure: Limited Echo, Limited Color Doppler and Cardiac Doppler Indications:    acute systolic chf  History:        Patient has no prior history of Echocardiogram examinations.                 Covid, Signs/Symptoms:Dyspnea; Risk Factors:Dyslipidemia.  Sonographer:    [place] Referring Phys: [id number] [place]  1. Global hypokinesis with akinesis of the inferolateral wall; overall moderate LV dysfunction; calcified aortic valve with probable moderate AS (mean gradient 12 mmHg; AVA calculated at 0.9 cm2  suggesting severe AS but DI not significant 0.35).  2. Left ventricular ejection fraction, by estimation, is 30 to 35%. The left ventricle has moderately decreased function. The left ventricle demonstrates regional wall motion abnormalities (see scoring diagram/findings for description). Left ventricular  diastolic parameters are consistent with Grade I diastolic dysfunction (impaired relaxation).  3. Right ventricular systolic function is mildly reduced. The right ventricular size is normal. There is mildly elevated pulmonary artery systolic pressure.  4. The mitral valve is normal in structure. No evidence of mitral valve regurgitation. No evidence of mitral stenosis. Severe mitral annular calcification.  5. The aortic valve is calcified. Aortic valve regurgitation is not visualized. Moderate aortic valve stenosis.  6. The inferior vena cava is normal in size with greater than 50% respiratory variability, suggesting right atrial pressure of 3 mmHg. Comparison(s): No prior Echocardiogram. FINDINGS  Left Ventricle: Left ventricular ejection fraction, by estimation, is 30 to 35%. The left ventricle has moderately decreased function. The left ventricle demonstrates regional wall motion abnormalities. The left ventricular internal cavity size was normal in size. There is no left ventricular hypertrophy. Left ventricular diastolic parameters are consistent with Grade I diastolic dysfunction (impaired relaxation). Right Ventricle: The right ventricular size is normal. Right ventricular systolic function is mildly reduced. There is mildly elevated pulmonary artery systolic pressure. The tricuspid regurgitant velocity is 2.88 m/s, and with an assumed right atrial pressure of 3 mmHg, the estimated  right ventricular systolic pressure is 99.3 mmHg. Left Atrium: Left atrial size was normal in size. Right Atrium: Right atrial size was normal in size. Pericardium: There is no evidence of pericardial effusion. Mitral Valve: The  mitral valve is normal in structure. Severe mitral annular calcification. No evidence of mitral valve stenosis. Tricuspid Valve: The tricuspid valve is normal in structure. Tricuspid valve regurgitation is mild . No evidence of tricuspid stenosis. Aortic Valve: The aortic valve is calcified. Aortic valve regurgitation is not visualized. Moderate aortic stenosis is present. Aortic valve mean gradient measures 11.5 mmHg. Aortic valve peak gradient measures 20.7 mmHg. Aortic valve area, by VTI measures 0.88 cm. Pulmonic Valve: The pulmonic valve was normal in structure. Pulmonic valve regurgitation is not visualized. No evidence of pulmonic stenosis. Aorta: The aortic root is normal in size and structure. Venous: The inferior vena cava is normal in size with greater than 50% respiratory variability, suggesting right atrial pressure of 3 mmHg. IAS/Shunts: No atrial level shunt detected by color flow Doppler. Additional Comments: Global hypokinesis with akinesis of the inferolateral wall; overall moderate LV dysfunction; calcified aortic valve with probable moderate AS (mean gradient 12 mmHg; AVA calculated at 0.9 cm2 suggesting severe AS but DI not significant 0.35). LEFT VENTRICLE PLAX 2D LVIDd:         4.60 cm     Diastology LVIDs:         3.40 cm     LV e' medial:    4.46 cm/s LV PW:         1.00 cm     LV E/e' medial:  13.4 LV IVS:        1.00 cm     LV e' lateral:   5.66 cm/s LVOT diam:     1.80 cm     LV E/e' lateral: 10.5 LV SV:         34 LV SV Index:   19 LVOT Area:     2.54 cm  LV Volumes (MOD) LV vol d, MOD A4C: 76.7 ml LV vol s, MOD A4C: 52.0 ml LV SV MOD A4C:     76.7 ml IVC IVC diam: 1.20 cm LEFT ATRIUM         Index LA diam:    3.60 cm 2.02 cm/m  AORTIC VALVE AV Area (Vmax):    0.84 cm AV Area (Vmean):   0.81 cm AV Area (VTI):     0.88 cm AV Vmax:           227.50 cm/s AV Vmean:          156.500 cm/s AV VTI:            0.387 m AV Peak Grad:      20.7 mmHg AV Mean Grad:      11.5 mmHg LVOT Vmax:          75.00 cm/s LVOT Vmean:        49.900 cm/s LVOT VTI:          0.134 m LVOT/AV VTI ratio: 0.35 MITRAL VALVE               TRICUSPID VALVE MV Area (PHT): 4.57 cm    TR Peak grad:   33.2 mmHg MV Decel Time: 166 msec    TR Vmax:        288.00 cm/s MV E velocity: 59.60 cm/s MV A velocity: 79.30 cm/s  SHUNTS MV E/A ratio:  0.75        Systemic  VTI:  0.13 m                            Systemic Diam: 1.80 cm Kirk Ruths MD Electronically signed by Kirk Ruths MD Signature Date/Time: 10/17/2021/4:09:16 PM    Final     Cardiac Studies   Echo  10/17/21  Global hypokinesis with akinesis of the inferolateral wall; overall moderate LV dysfunction; calcified aortic valve with probable moderate AS (mean gradient 12 mmHg; AVA calculated at 0.9 cm2 suggesting severe AS but DI not significant 0.35). 1. Left ventricular ejection fraction, by estimation, is 30 to 35%. The left ventricle has moderately decreased function. The left ventricle demonstrates regional wall motion abnormalities (see scoring diagram/findings for description). Left ventricular diastolic parameters are consistent with Grade I diastolic dysfunction (impaired relaxation). 2. Right ventricular systolic function is mildly reduced. The right ventricular size is normal. There is mildly elevated pulmonary artery systolic pressure. 3. The mitral valve is normal in structure. No evidence of mitral valve regurgitation. No evidence of mitral stenosis. Severe mitral annular calcification. 4. The aortic valve is calcified. Aortic valve regurgitation is not visualized. Moderate aortic valve stenosis. 5. The inferior vena cava is normal in size with greater than 50% respiratory variability, suggesting right atrial pressure of 3 mmHg. 6. Comparison(s): No prior Echocardiogram.  Patient Profile     CARRI SPILLERS is a 74 y.o. female with a hx of HLD, obesity, GERD, pre-diabetes who is being seen 10/18/2021 for the evaluation of CHF and aortic  stenosis at the request of Dr. Tawanna Solo.  Assessment & Plan    1  Acute on chronic systolid CHF  This is a new dx   Pt has never had CP    Plan was for R/L heart catheterization on Monday PM to eval   Risks/benefits described  Pt understands and agrees to proceed.    2  Aortic stenosis  I have reviewed echo     3  RBBB   No old EKG  4    For questions or updates, please contact [facility] Please consult [email or web address] for contact info under        Signed, Dorris Carnes, MD  10/19/2021, 7:46 AM

## 2021-10-20 ENCOUNTER — Inpatient Hospital Stay (HOSPITAL_COMMUNITY): Payer: Medicare HMO

## 2021-10-20 DIAGNOSIS — I5021 Acute systolic (congestive) heart failure: Secondary | ICD-10-CM | POA: Diagnosis not present

## 2021-10-20 DIAGNOSIS — I509 Heart failure, unspecified: Secondary | ICD-10-CM | POA: Diagnosis not present

## 2021-10-20 DIAGNOSIS — I350 Nonrheumatic aortic (valve) stenosis: Secondary | ICD-10-CM | POA: Diagnosis not present

## 2021-10-20 LAB — BASIC METABOLIC PANEL
Anion gap: 6 (ref 5–15)
BUN: 26 mg/dL — ABNORMAL HIGH (ref 8–23)
CO2: 28 mmol/L (ref 22–32)
Calcium: 8.3 mg/dL — ABNORMAL LOW (ref 8.9–10.3)
Chloride: 98 mmol/L (ref 98–111)
Creatinine, Ser: 0.69 mg/dL (ref 0.44–1.00)
GFR, Estimated: >60 mL/min (ref >60)
Glucose, Bld: 116 mg/dL — ABNORMAL HIGH (ref 70–99)
Potassium: 4.1 mmol/L (ref 3.5–5.1)
Sodium: 132 mmol/L — ABNORMAL LOW (ref 135–145)

## 2021-10-20 MED ORDER — SODIUM CHLORIDE 0.9% FLUSH
3.0000 mL | Freq: Two times a day (BID) | INTRAVENOUS | Status: DC
  Administered 2021-10-21 – 2021-10-22 (×4): 3 mL via INTRAVENOUS

## 2021-10-20 MED ORDER — SODIUM CHLORIDE 0.9 % IV SOLN: INTRAVENOUS | Status: DC

## 2021-10-20 MED ORDER — SODIUM CHLORIDE 0.9 % IV SOLN: 250.0000 mL | INTRAVENOUS | Status: DC | PRN

## 2021-10-20 MED ORDER — SODIUM CHLORIDE 0.9% FLUSH: 3.0000 mL | INTRAVENOUS | Status: DC | PRN

## 2021-10-20 MED ORDER — ASPIRIN 81 MG PO CHEW
81.0000 mg | CHEWABLE_TABLET | ORAL | Status: AC
  Administered 2021-10-21: 81 mg via ORAL

## 2021-10-20 MED ORDER — IOHEXOL 350 MG/ML SOLN
75.0000 mL | Freq: Once | INTRAVENOUS | Status: AC | PRN
  Administered 2021-10-20: 16:00:00 75 mL via INTRAVENOUS

## 2021-10-20 NOTE — Progress Notes (Signed)
PROGRESS NOTE    Amy [name]  WEX:[id number] DOB: [birth year] DOA: 10/14/2021 PCP: Shirline Frees, MD   Chief Complain: Shortness of breath  Brief Narrative: Patient is a 75 year old female with history of hyperlipidemia who presented with shortness of breath that started about 3 weeks ago.  She was also complaining of cough.  Cough did not improve with outpatient medication.  She was seen in the urgent care and sent to the emergency department.  COVID screen test, today was diabetes is elevated for cardiomegaly, pulmonary vascular congestion.  Labs showed elevated BNP.  Patient was admitted for the management of suspected acute congestive heart failure.  Currently on IV Lasix.  Echocardiogram showed EF of 30 to 93%, grade 1 diastolic dysfunction, moderate to severe aortic stenosis.  These are all new findings.  No previous echocardiogram found in system.  Cardiology consulted , plan for L/R heart cath on Monday.  Assessment & Plan:   Principal Problem:   Acute CHF (congestive heart failure) (HCC)   Acute congestive heart failure: No history of CHF.  Presented with cough ,elevated BNP.  Chest x-ray suggestive of CHF.  Started on Lasix 40 mg twice daily.   Continue to monitor daily weight, input/output. Has bibasilar crackles but no peripheral edema. Echocardiogram showed EF of 30 to 81%, grade 1 diastolic dysfunction, moderate to severe aortic stenosis.  These are all new findings.  No previous echocardiogram found in system.  Cardiology consulted . Planning for L/R cardiac cath on Monday. Currently on torsemide.  COVID-19: Symptoms started about 3 weeks ago.  No pneumonia seen on the chest x-ray.  Currently not hypoxic.  No acute indication for treatment.  Continue supportive care  Hyponatremia: Sodium in the range of 125 on presentation.  Possible hypervolemic hyponatremia from CHF.    Na has improved to the range of 130s today  Hyperlipidemia: On statin.  LDL of 120  Suspected left  lower extremity cellulitis versus venous stasis: Started on cefazolin,changed  to keflex  Prediabetes: Hemoglobin A1c of 6.3.  Advised lifestyle changes and dietary modification  Morbid obesity: BMI of 39.6  Weakness: Patient has been complaining of exertional dyspnea/weakness.  We have requested  for physical therapy evaluation        DVT prophylaxis: Lovenox Code Status: Full code Family Communication:Called and discussed with the daughter on phone on 10/18/21 Patient status:  Dispo: The patient is from: Home               Anticipated d/c is to: Home              Anticipated d/c date is:2-3 days.  Awaiting cath  Consultants: Cardiology  Procedures:None  Antimicrobials:  Anti-infectives (From admission, onward)    Start     Dose/Rate Route Frequency Ordered Stop   10/16/21 1400  cephALEXin (KEFLEX) capsule 500 mg        500 mg Oral Every 8 hours 10/16/21 1121 10/20/21 0540   10/15/21 2200  ceFAZolin (ANCEF) IVPB 1 g/50 mL premix  Status:  Discontinued        1 g 100 mL/hr over 30 Minutes Intravenous Every 8 hours 10/15/21 2040 10/16/21 1121       Subjective:  Patient seen and examined the bedside this morning.  Hemodynamically stable.  Comfortable.  On room air.  No complaints today  Objective: Vitals:   10/19/21 0539 10/19/21 1420 10/19/21 2158 10/20/21 0540  BP: 132/87 (!) 140/51 128/74 (!) 132/52  Pulse: 91 93 92 79  Resp: 15 19 17 17   Temp: 98.4 F (36.9 C) 98.2 F (36.8 C) 98.2 F (36.8 C) 97.9 F (36.6 C)  TempSrc: Oral Oral Oral Oral  SpO2: 90% 93% 93% 94%  Weight:    84.6 kg  Height:        Intake/Output Summary (Last 24 hours) at 10/20/2021 0805 Last data filed at 10/20/2021 0600 Gross per 24 hour  Intake 120 ml  Output 600 ml  Net -480 ml   Filed Weights   10/18/21 0500 10/19/21 0500 10/20/21 0540  Weight: 86.6 kg 84.6 kg 84.6 kg    Examination:  General exam: Overall comfortable, not in distress,obese HEENT: PERRL Respiratory system:   no wheezes or crackles  Cardiovascular system: S1 & S2 heard, RRR.  Gastrointestinal system: Abdomen is nondistended, soft and nontender. Central nervous system: Alert and oriented Extremities: No edema, no clubbing ,no cyanosis Skin: No rashes, no ulcers,no icterus      Data Reviewed: I have personally reviewed following labs and imaging studies  CBC: Recent Labs  Lab 10/15/21 0629 10/15/21 2125 10/16/21 0421 10/19/21 0503  WBC 13.5* 12.6* 11.1* 9.7  NEUTROABS 9.2*  --   --   --   HGB 13.8 14.1 12.7 14.1  HCT 39.8 40.9 37.4 42.8  MCV 92.6 92.5 95.2 95.1  PLT 345 324 302 482   Basic Metabolic Panel: Recent Labs  Lab 10/15/21 2125 10/16/21 0421 10/17/21 0357 10/18/21 0429 10/19/21 0503 10/20/21 0408  NA 130* 126*  125* 128* 132* 131* 132*  K 3.2* 3.0*  3.0* 3.6 3.8 4.3 4.1  CL 92* 92*  92* 93* 97* 99 98  CO2 26 26  26 26 27 29 28   GLUCOSE 101* 97  97 113* 125* 103* 116*  BUN 17 25*  25* 23 33* 28* 26*  CREATININE 0.56  0.55 0.58  0.58 0.51 0.61 0.63 0.69  CALCIUM 8.4* 8.0*  7.9* 7.7* 8.3* 8.2* 8.3*  MG 2.2  --   --   --   --   --   PHOS 3.4 4.0  --   --   --   --    GFR: Estimated Creatinine Clearance: 56.9 mL/min (by C-G formula based on SCr of 0.69 mg/dL). Liver Function Tests: Recent Labs  Lab 10/15/21 0629 10/15/21 2125 10/16/21 0421  AST 24  --  21  ALT 24  --  24  ALKPHOS 55  --  51  BILITOT 0.9  --  0.9  PROT 8.0  --  6.7  ALBUMIN 3.8 3.8 3.3*  3.3*   No results for input(s): LIPASE, AMYLASE in the last 168 hours. No results for input(s): AMMONIA in the last 168 hours. Coagulation Profile: No results for input(s): INR, PROTIME in the last 168 hours. Cardiac Enzymes: No results for input(s): CKTOTAL, CKMB, CKMBINDEX, TROPONINI in the last 168 hours. BNP (last 3 results) No results for input(s): PROBNP in the last 8760 hours. HbA1C: No results for input(s): HGBA1C in the last 72 hours.  CBG: No results for input(s): GLUCAP in the  last 168 hours. Lipid Profile: Recent Labs    10/19/21 0503  CHOL 177  HDL 36*  LDLCALC 120*  TRIG 104  CHOLHDL 4.9   Thyroid Function Tests: No results for input(s): TSH, T4TOTAL, FREET4, T3FREE, THYROIDAB in the last 72 hours.  Anemia Panel: No results for input(s): VITAMINB12, FOLATE, FERRITIN, TIBC, IRON, RETICCTPCT in the last 72 hours. Sepsis Labs: No results for input(s): PROCALCITON, LATICACIDVEN in the  last 168 hours.  Recent Results (from the past 240 hour(s))  Resp Panel by RT-PCR (Flu A&B, Covid) Nasopharyngeal Swab     Status: Abnormal   Collection Time: 10/14/21 11:53 PM   Specimen: Nasopharyngeal Swab; Nasopharyngeal(NP) swabs in vial transport medium  Result Value Ref Range Status   SARS Coronavirus 2 by RT PCR POSITIVE (A) NEGATIVE Final    Comment: CRITICAL RESULT CALLED TO, READ BACK BY AND VERIFIED WITH: TJ RIVERS RN 10/15/21 @ 0107 VS (NOTE) SARS-CoV-2 target nucleic acids are DETECTED.  The SARS-CoV-2 RNA is generally detectable in upper respiratory specimens during the acute phase of infection. Positive results are indicative of the presence of the identified virus, but do not rule out bacterial infection or co-infection with other pathogens not detected by the test. Clinical correlation with patient history and other diagnostic information is necessary to determine patient infection status. The expected result is Negative.  Fact Sheet for Patients: EntrepreneurPulse.com.au  Fact Sheet for Healthcare Providers: IncredibleEmployment.be  This test is not yet approved or cleared by the [place] FDA and  has been authorized for detection and/or diagnosis of SARS-CoV-2 by FDA under an Emergency Use Authorization (EUA).  This EUA will remain in effect (meaning this test  can be used) for the duration of  the COVID-19 declaration under Section 564(b)(1) of the Act, 21 U.S.C. section 360bbb-3(b)(1), unless the  authorization is terminated or revoked sooner.     Influenza A by PCR NEGATIVE NEGATIVE Final   Influenza B by PCR NEGATIVE NEGATIVE Final    Comment: (NOTE) The Xpert Xpress SARS-CoV-2/FLU/RSV plus assay is intended as an aid in the diagnosis of influenza from Nasopharyngeal swab specimens and should not be used as a sole basis for treatment. Nasal washings and aspirates are unacceptable for Xpert Xpress SARS-CoV-2/FLU/RSV testing.  Fact Sheet for Patients: EntrepreneurPulse.com.au  Fact Sheet for Healthcare Providers: IncredibleEmployment.be  This test is not yet approved or cleared by the [place] FDA and has been authorized for detection and/or diagnosis of SARS-CoV-2 by FDA under an Emergency Use Authorization (EUA). This EUA will remain in effect (meaning this test can be used) for the duration of the COVID-19 declaration under Section 564(b)(1) of the Act, 21 U.S.C. section 360bbb-3(b)(1), unless the authorization is terminated or revoked.  Performed at [facility], [place] [address] [place], [place] [address]   Culture, blood (routine x 2)     Status: None (Preliminary result)   Collection Time: 10/15/21  9:25 PM   Specimen: BLOOD  Result Value Ref Range Status   Specimen Description   Final    BLOOD LEFT ANTECUBITAL Performed at [facility] [address], [place], [place] [address]    Special Requests   Final    BOTTLES DRAWN AEROBIC ONLY Blood Culture adequate volume Performed at [facility] [address] [place], [place] [address]    Culture   Final    NO GROWTH 3 DAYS Performed at [facility], [place] [address] [place], [place] [address]    Report Status PENDING  Incomplete  Culture, blood (routine x 2)     Status: None (Preliminary result)   Collection Time: 10/15/21  9:25 PM   Specimen: BLOOD RIGHT HAND  Result Value Ref Range Status   Specimen  Description   Final    BLOOD RIGHT HAND Performed at [place] [address] [place], [place] [address]    Special Requests  Final    BOTTLES DRAWN AEROBIC ONLY Blood Culture adequate volume Performed at [facility] [address], [place], [place] [address]    Culture   Final    NO GROWTH 3 DAYS Performed at [facility], [place] [address] [place],  [address]    Report Status PENDING  Incomplete         Radiology Studies: No results found.      Scheduled Meds:  diclofenac Sodium  4 g Topical QID   enoxaparin (LOVENOX) injection  40 mg Subcutaneous Q24H   guaiFENesin  600 mg Oral BID   metoprolol succinate  12.5 mg Oral Daily   potassium chloride  20 mEq Oral Daily   torsemide  20 mg Oral Daily   Continuous Infusions:     LOS: 5 days    Time spent: 25 mins.More than 50% of that time was spent in counseling and/or coordination of care.      Shelly Coss, MD [facility] P12/02/2021, 8:05 AM

## 2021-10-20 NOTE — Progress Notes (Signed)
Progress Note  Patient Name: Amy [name] Date of Encounter: 10/20/2021  [facility] Cardiologist: NEW  Subjective   Breathing is OK   No CP    Inpatient Medications    Scheduled Meds:  diclofenac Sodium  4 g Topical QID   enoxaparin (LOVENOX) injection  40 mg Subcutaneous Q24H   guaiFENesin  600 mg Oral BID   metoprolol succinate  12.5 mg Oral Daily   potassium chloride  20 mEq Oral Daily   torsemide  20 mg Oral Daily   Continuous Infusions:   Vital Signs    Vitals:   10/19/21 0539 10/19/21 1420 10/19/21 2158 10/20/21 0540  BP: 132/87 (!) 140/51 128/74 (!) 132/52  Pulse: 91 93 92 79  Resp: 15 19 17 17   Temp: 98.4 F (36.9 C) 98.2 F (36.8 C) 98.2 F (36.8 C) 97.9 F (36.6 C)  TempSrc: Oral Oral Oral Oral  SpO2: 90% 93% 93% 94%  Weight:    84.6 kg  Height:        Intake/Output Summary (Last 24 hours) at 10/20/2021 1010 Last data filed at 10/20/2021 0600 Gross per 24 hour  Intake 120 ml  Output 600 ml  Net -480 ml   Last 3 Weights 10/20/2021 10/19/2021 10/18/2021  Weight (lbs) 186 lb 8.2 oz 186 lb 8.2 oz 190 lb 14.7 oz  Weight (kg) 84.6 kg 84.6 kg 86.6 kg      Telemetry    SR  - Personally Reviewed  ECG    No new   - Personally Reviewed  Physical Exam   GEN:  Obese 75 yo  No acute distress.   Neck: No JVD Cardiac: RRR, Gr III/VI systolic murmur     Respiratory: REl clear  Ext   No LE edema  Neuro:  Nonfocal  Psych: Normal affect   Labs    High Sensitivity Troponin:   Recent Labs  Lab 10/15/21 0629 10/15/21 0829  TROPONINIHS 23* 21*     Chemistry Recent Labs  Lab 10/15/21 0629 10/15/21 2125 10/16/21 0421 10/17/21 0357 10/18/21 0429 10/19/21 0503 10/20/21 0408  NA 127* 130* 126*  125*   < > 132* 131* 132*  K 3.2* 3.2* 3.0*  3.0*   < > 3.8 4.3 4.1  CL 90* 92* 92*  92*   < > 97* 99 98  CO2 25 26 26  26    < > 27 29 28   GLUCOSE 118* 101* 97  97   < > 125* 103* 116*  BUN 17 17 25*  25*   < > 33* 28* 26*  CREATININE 0.56  0.56  0.55 0.58  0.58   < > 0.61 0.63 0.69  CALCIUM 8.6* 8.4* 8.0*  7.9*   < > 8.3* 8.2* 8.3*  MG  --  2.2  --   --   --   --   --   PROT 8.0  --  6.7  --   --   --   --   ALBUMIN 3.8 3.8 3.3*  3.3*  --   --   --   --   AST 24  --  21  --   --   --   --   ALT 24  --  24  --   --   --   --   ALKPHOS 55  --  51  --   --   --   --   BILITOT 0.9  --  0.9  --   --   --   --  GFRNONAA >60 >60  >60 >60  >60   < > >60 >60 >60  ANIONGAP 12 12 8  7    < > 8 3* 6   < > = values in this interval not displayed.    Lipids  Recent Labs  Lab 10/19/21 0503  CHOL 177  TRIG 104  HDL 36*  LDLCALC 120*  CHOLHDL 4.9    Hematology Recent Labs  Lab 10/15/21 2125 10/16/21 0421 10/19/21 0503  WBC 12.6* 11.1* 9.7  RBC 4.42 3.93 4.50  HGB 14.1 12.7 14.1  HCT 40.9 37.4 42.8  MCV 92.5 95.2 95.1  MCH 31.9 32.3 31.3  MCHC 34.5 34.0 32.9  RDW 14.6 14.5 14.7  PLT 324 302 330   Thyroid  Recent Labs  Lab 10/16/21 0421  TSH 0.829    BNP Recent Labs  Lab 10/15/21 0629 10/17/21 1220  BNP 991.7* 363.8*    DDimer  Recent Labs  Lab 10/15/21 0629  DDIMER 0.86*     Radiology    No results found.  Cardiac Studies   Echo  10/17/21  Global hypokinesis with akinesis of the inferolateral wall; overall moderate LV dysfunction; calcified aortic valve with probable moderate AS (mean gradient 12 mmHg; AVA calculated at 0.9 cm2 suggesting severe AS but DI not significant 0.35). 1. Left ventricular ejection fraction, by estimation, is 30 to 35%. The left ventricle has moderately decreased function. The left ventricle demonstrates regional wall motion abnormalities (see scoring diagram/findings for description). Left ventricular diastolic parameters are consistent with Grade I diastolic dysfunction (impaired relaxation). 2. Right ventricular systolic function is mildly reduced. The right ventricular size is normal. There is mildly elevated pulmonary artery systolic pressure. 3. The  mitral valve is normal in structure. No evidence of mitral valve regurgitation. No evidence of mitral stenosis. Severe mitral annular calcification. 4. The aortic valve is calcified. Aortic valve regurgitation is not visualized. Moderate aortic valve stenosis. 5. The inferior vena cava is normal in size with greater than 50% respiratory variability, suggesting right atrial pressure of 3 mmHg. 6. Comparison(s): No prior Echocardiogram.  Patient Profile     Amy [name] is a 75 y.o. female with a hx of HLD, obesity, GERD, pre-diabetes who is being seen 10/18/2021 for the evaluation of CHF and aortic stenosis at the request of Dr. Tawanna Solo.  Assessment & Plan    1  Acute systolic CHF   Pt has diuresed since admit   No Hx of CP  Plan was for R/L heart catheterization on Monday PM to eval   Risks/benefits described  Pt understands and agrees to proceed.    Note that echo shows RV dysfunction   WIth elevated d dimer I would recomm CT to r/o PE prior to cath     2  Aortic stenosis    Stenosis appears moderate   Mean gradient is 11.5  (but in setting of Stroke volume index of 19),  However dimensionless index is 0.35   Will need more hemodynamics    3  RBBB   No old EKG  4  COVID +   Pt is at least 5 day out   Was symptomatic prior       For questions or updates, please contact [facility] Please consult [email or web address] for contact info under        Signed, Dorris Carnes, MD  10/20/2021, 10:10 AM

## 2021-10-20 NOTE — Evaluation (Signed)
Physical Therapy Evaluation Patient Details Name: Amy [name] MRN: [id number] DOB: Nov 08, 1946 Today's Date: 10/20/2021  History of Present Illness  75 yo female admitted with COVID, CHF. Hx of obesity, CHF, aortic stenosis, RBBB  Clinical Impression  On eval, pt was Min A for mobility. She walked ~55 feet with a RW. Recommended to pt that she use a RW for ambulation safety. Will recommend HHPT f/u if pt is agreeable Will plan to follow pt during hospital stay.     Recommendations for follow up therapy are one component of a multi-disciplinary discharge planning process, led by the attending physician.  Recommendations may be updated based on patient status, additional functional criteria and insurance authorization.  Follow Up Recommendations Home health PT    Assistance Recommended at Discharge Intermittent Supervision/Assistance  Functional Status Assessment Patient has had a recent decline in their functional status and demonstrates the ability to make significant improvements in function in a reasonable and predictable amount of time.  Equipment Recommendations  None recommended by PT    Recommendations for Other Services       Precautions / Restrictions Precautions Precautions: Fall Restrictions Weight Bearing Restrictions: No      Mobility  Bed Mobility Overal bed mobility: Needs Assistance Bed Mobility: Supine to Sit;Sit to Supine     Supine to sit: Min assist Sit to supine: Min guard   General bed mobility comments: Assist to get to EOB. Increased time. Pt sat EOB for ~5 mins or so to acclimate (has hx of dizziness)    Transfers Overall transfer level: Needs assistance Equipment used: Rolling walker (2 wheels) Transfers: Sit to/from Stand Sit to Stand: Min assist           General transfer comment: Assist to power up, steady, control descent. Cues for safety, technique, hand placement. Increased time. Pt stood at bedside for ~2 mins or so.     Ambulation/Gait Ambulation/Gait assistance: Min guard;Min assist Gait Distance (Feet): 55 Feet Assistive device: Rolling walker (2 wheels) Gait Pattern/deviations: Step-through pattern;Decreased stride length;Decreased step length - right;Decreased step length - left       General Gait Details: Initially began with large base quad cane-very unsteady with pt reaching out for furniture with opposite hand-Min A. Switched over to RW-improved stability-Min guard  Stairs            Wheelchair Mobility    Modified Rankin (Stroke Patients Only)       Balance Overall balance assessment: Needs assistance;History of Falls         Standing balance support: Bilateral upper extremity supported Standing balance-Leahy Scale: Poor                               Pertinent Vitals/Pain Pain Assessment: No/denies pain    Home Living Family/patient expects to be discharged to:: Private residence Living Arrangements: Children (lives with daughter)   Type of Home: House Home Access: Stairs to enter Entrance Stairs-Rails: None (has handle she can pull) Technical brewer of Steps: 2   Home Layout: One level Home Equipment: Conservation officer, nature (2 wheels);Cane - quad;BSC/3in1;Shower seat ("hurry" cane)      Prior Function Prior Level of Function : Needs assist             Mobility Comments: uses quad cane PRN       Hand Dominance        Extremity/Trunk Assessment   Upper Extremity Assessment Upper Extremity Assessment:  Overall [facility] for tasks assessed    Lower Extremity Assessment Lower Extremity Assessment: Generalized weakness    Cervical / Trunk Assessment Cervical / Trunk Assessment: Normal  Communication   Communication: No difficulties  Cognition Arousal/Alertness: Awake/alert Behavior During Therapy: WFL for tasks assessed/performed Overall Cognitive Status: Within Functional Limits for tasks assessed                                           General Comments      Exercises     Assessment/Plan    PT Assessment Patient needs continued PT services  PT Problem List Decreased strength;Decreased mobility;Decreased activity tolerance;Decreased balance;Decreased knowledge of use of DME       PT Treatment Interventions DME instruction;Therapeutic activities;Gait training;Therapeutic exercise;Functional mobility training;Balance training    PT Goals (Current goals can be found in the Care Plan section)  Acute Rehab PT Goals Patient Stated Goal: home soon PT Goal Formulation: With patient Time For Goal Achievement: 11/03/21 Potential to Achieve Goals: Good    Frequency     Barriers to discharge        Co-evaluation               AM-PAC PT "6 Clicks" Mobility  Outcome Measure Help needed turning from your back to your side while in a flat bed without using bedrails?: A Little Help needed moving from lying on your back to sitting on the side of a flat bed without using bedrails?: A Little Help needed moving to and from a bed to a chair (including a wheelchair)?: A Little Help needed standing up from a chair using your arms (e.g., wheelchair or bedside chair)?: A Little Help needed to walk in hospital room?: A Little Help needed climbing 3-5 steps with a railing? : A Lot 6 Click Score: 17    End of Session Equipment Utilized During Treatment: Gait belt Activity Tolerance: Patient tolerated treatment well Patient left: in bed;with call bell/phone within reach   PT Visit Diagnosis: Muscle weakness (generalized) (M62.81);Difficulty in walking, not elsewhere classified (R26.2)    Time: 6568-1275 PT Time Calculation (min) (ACUTE ONLY): 43 min   Charges:   PT Evaluation $PT Eval Moderate Complexity: 1 Mod PT Treatments $Gait Training: 8-22 mins $Therapeutic Activity: 8-22 mins          Doreatha Massed, PT Acute Rehabilitation  Office: [phone] Pager: [phone]

## 2021-10-21 ENCOUNTER — Inpatient Hospital Stay (HOSPITAL_COMMUNITY): Admission: RE | Admit: 2021-10-21 | Payer: Medicare HMO | Source: Home / Self Care | Admitting: Internal Medicine

## 2021-10-21 DIAGNOSIS — I350 Nonrheumatic aortic (valve) stenosis: Secondary | ICD-10-CM

## 2021-10-21 DIAGNOSIS — I509 Heart failure, unspecified: Secondary | ICD-10-CM | POA: Diagnosis not present

## 2021-10-21 DIAGNOSIS — U071 COVID-19: Secondary | ICD-10-CM | POA: Diagnosis not present

## 2021-10-21 DIAGNOSIS — I5021 Acute systolic (congestive) heart failure: Secondary | ICD-10-CM | POA: Diagnosis not present

## 2021-10-21 LAB — CULTURE, BLOOD (ROUTINE X 2)
Culture: NO GROWTH
Culture: NO GROWTH
Special Requests: ADEQUATE
Special Requests: ADEQUATE

## 2021-10-21 LAB — BASIC METABOLIC PANEL
Anion gap: 6 (ref 5–15)
BUN: 31 mg/dL — ABNORMAL HIGH (ref 8–23)
CO2: 29 mmol/L (ref 22–32)
Calcium: 8.9 mg/dL (ref 8.9–10.3)
Chloride: 97 mmol/L — ABNORMAL LOW (ref 98–111)
Creatinine, Ser: 0.62 mg/dL (ref 0.44–1.00)
GFR, Estimated: >60 mL/min (ref >60)
Glucose, Bld: 127 mg/dL — ABNORMAL HIGH (ref 70–99)
Potassium: 4.2 mmol/L (ref 3.5–5.1)
Sodium: 132 mmol/L — ABNORMAL LOW (ref 135–145)

## 2021-10-21 LAB — SURGICAL PCR SCREEN
MRSA, PCR: NEGATIVE
Staphylococcus aureus: NEGATIVE

## 2021-10-21 MED ORDER — ASPIRIN 81 MG PO CHEW
81.0000 mg | CHEWABLE_TABLET | Freq: Every day | ORAL | Status: DC
  Administered 2021-10-22 – 2021-10-23 (×2): 81 mg via ORAL
  Filled 2021-10-21 (×2): qty 1

## 2021-10-21 MED ORDER — LIVING BETTER WITH HEART FAILURE BOOK: Freq: Once | Status: AC

## 2021-10-21 NOTE — H&P (Signed)
COVID + CHF Right and left heart to evaluate adequacy of therapy and to r/o significant CAD.

## 2021-10-21 NOTE — Progress Notes (Signed)
Progress Note  Patient Name: Amy [name] Date of Encounter: 10/21/2021  [facility] Cardiologist: NEW  Subjective   No reported issue overnight - plan for [facility] today at [facility].  Inpatient Medications    Scheduled Meds:  diclofenac Sodium  4 g Topical QID   enoxaparin (LOVENOX) injection  40 mg Subcutaneous Q24H   guaiFENesin  600 mg Oral BID   metoprolol succinate  12.5 mg Oral Daily   potassium chloride  20 mEq Oral Daily   sodium chloride flush  3 mL Intravenous Q12H   torsemide  20 mg Oral Daily   Continuous Infusions:   Vital Signs    Vitals:   10/20/21 1328 10/20/21 2121 10/21/21 0419 10/21/21 0424  BP: (!) 141/75 121/64 (!) 132/58   Pulse: 83 89 78   Resp: 14 17 18    Temp: 97.8 F (36.6 C) 97.8 F (36.6 C) 98.2 F (36.8 C)   TempSrc: Oral Oral Oral   SpO2: 95% 92% 91%   Weight:    84.3 kg  Height:        Intake/Output Summary (Last 24 hours) at 10/21/2021 1217 Last data filed at 10/21/2021 1033 Gross per 24 hour  Intake 126 ml  Output 2225 ml  Net -2099 ml   Last 3 Weights 10/21/2021 10/20/2021 10/19/2021  Weight (lbs) 185 lb 13.6 oz 186 lb 8.2 oz 186 lb 8.2 oz  Weight (kg) 84.3 kg 84.6 kg 84.6 kg      Telemetry    Sinus rhythm in the 70's - Personally Reviewed  ECG    N/A  Physical Exam   Due to the current COVID-19 pandemic and the desire to minimize use of PPE and reduce provider exposure, the patient exam was deferred or conducted via video or teleconference.  Labs    High Sensitivity Troponin:   Recent Labs  Lab 10/15/21 0629 10/15/21 0829  TROPONINIHS 23* 21*     Chemistry Recent Labs  Lab 10/15/21 0629 10/15/21 2125 10/16/21 0421 10/17/21 0357 10/19/21 0503 10/20/21 0408 10/21/21 0406  NA 127* 130* 126*  125*   < > 131* 132* 132*  K 3.2* 3.2* 3.0*  3.0*   < > 4.3 4.1 4.2  CL 90* 92* 92*  92*   < > 99 98 97*  CO2 25 26 26  26    < > 29 28 29   GLUCOSE 118* 101* 97  97   < > 103* 116* 127*  BUN 17 17 25*  25*    < > 28* 26* 31*  CREATININE 0.56 0.56  0.55 0.58  0.58   < > 0.63 0.69 0.62  CALCIUM 8.6* 8.4* 8.0*  7.9*   < > 8.2* 8.3* 8.9  MG  --  2.2  --   --   --   --   --   PROT 8.0  --  6.7  --   --   --   --   ALBUMIN 3.8 3.8 3.3*  3.3*  --   --   --   --   AST 24  --  21  --   --   --   --   ALT 24  --  24  --   --   --   --   ALKPHOS 55  --  51  --   --   --   --   BILITOT 0.9  --  0.9  --   --   --   --  GFRNONAA >60 >60  >60 >60  >60   < > >60 >60 >60  ANIONGAP 12 12 8  7    < > 3* 6 6   < > = values in this interval not displayed.    Lipids  Recent Labs  Lab 10/19/21 0503  CHOL 177  TRIG 104  HDL 36*  LDLCALC 120*  CHOLHDL 4.9    Hematology Recent Labs  Lab 10/15/21 2125 10/16/21 0421 10/19/21 0503  WBC 12.6* 11.1* 9.7  RBC 4.42 3.93 4.50  HGB 14.1 12.7 14.1  HCT 40.9 37.4 42.8  MCV 92.5 95.2 95.1  MCH 31.9 32.3 31.3  MCHC 34.5 34.0 32.9  RDW 14.6 14.5 14.7  PLT 324 302 330   Thyroid  Recent Labs  Lab 10/16/21 0421  TSH 0.829    BNP Recent Labs  Lab 10/15/21 0629 10/17/21 1220  BNP 991.7* 363.8*    DDimer  Recent Labs  Lab 10/15/21 0629  DDIMER 0.86*     Radiology    CT Angio Chest Pulmonary Embolism (PE) W or WO Contrast  Result Date: 10/20/2021 CLINICAL DATA:  A 75 year old female presents for evaluation of chest pain or shortness of breath. EXAM: CT ANGIOGRAPHY CHEST WITH CONTRAST TECHNIQUE: Multidetector CT imaging of the chest was performed using the standard protocol during bolus administration of intravenous contrast. Multiplanar CT image reconstructions and MIPs were obtained to evaluate the vascular anatomy. CONTRAST:  77mL OMNIPAQUE IOHEXOL 350 MG/ML SOLN COMPARISON:  Chest x-ray of October 14, 2021. FINDINGS: Cardiovascular: Calcified atherosclerotic plaque of the thoracic aorta. Tortuous great vessels in the chest. Limited assessment based on phase of contrast enhancement. Calcified coronary artery disease. Heart size moderately  enlarged. No signs of suspecting antral pericardial effusion. Main pulmonary artery at 400 Hounsfield unit density. No signs of central, lobar or segmental level embolus with limited assessment particularly at the lung bases due to respiratory motion also general limited assessment of more peripheral vessels due to respiratory motion on the current study. Mediastinum/Nodes: Mildly patulous and diffusely mildly thickened esophagus. No adenopathy in the chest. Lungs/Pleura: Parenchymal assessment mildly limited by respiratory motion. No gross consolidative process. No signs of effusion or pneumothorax. Airways are patent. Upper Abdomen: Incidental imaging of upper abdominal contents without acute process. Musculoskeletal: No acute bone finding. No destructive bone process. Spinal degenerative changes. Review of the MIP images confirms the above findings. IMPRESSION: No signs of central, lobar or segmental level pulmonary embolus with limited assessment of more peripheral vessels due to respiratory motion on the current study. Cardiomegaly with coronary artery disease. Mildly patulous and diffusely mildly thickened esophagus. Correlate with any symptoms of esophageal dysmotility and or esophagitis. Aortic atherosclerosis. Aortic Atherosclerosis (ICD10-I70.0). Electronically Signed   By: Zetta Bills M.D.   On: 10/20/2021 18:00    Cardiac Studies   Echo  10/17/21  Global hypokinesis with akinesis of the inferolateral wall; overall moderate LV dysfunction; calcified aortic valve with probable moderate AS (mean gradient 12 mmHg; AVA calculated at 0.9 cm2 suggesting severe AS but DI not significant 0.35). 1. Left ventricular ejection fraction, by estimation, is 30 to 35%. The left ventricle has moderately decreased function. The left ventricle demonstrates regional wall motion abnormalities (see scoring diagram/findings for description). Left ventricular diastolic parameters are consistent with Grade I  diastolic dysfunction (impaired relaxation). 2. Right ventricular systolic function is mildly reduced. The right ventricular size is normal. There is mildly elevated pulmonary artery systolic pressure. 3. The mitral valve is normal in structure. No  evidence of mitral valve regurgitation. No evidence of mitral stenosis. Severe mitral annular calcification. 4. The aortic valve is calcified. Aortic valve regurgitation is not visualized. Moderate aortic valve stenosis. 5. The inferior vena cava is normal in size with greater than 50% respiratory variability, suggesting right atrial pressure of 3 mmHg. 6. Comparison(s): No prior Echocardiogram.  Patient Profile     Amy [name] is a 75 y.o. female with a hx of HLD, obesity, GERD, pre-diabetes who is being seen 10/18/2021 for the evaluation of CHF and aortic stenosis at the request of Dr. Tawanna Solo.  Assessment & Plan    1  Acute systolic CHF   Pt has diuresed since admit   No Hx of CP  Plan was for R/L heart catheterization on Monday PM to eval   Risks/benefits described  Pt understands and agrees to proceed.    Note that echo shows RV dysfunction . Dr Harrington Challenger ordered CT angiogram which was negative for PE.  2  Aortic stenosis    Stenosis appears moderate   Mean gradient is 11.5  (but in setting of Stroke volume index of 19),  However dimensionless index is 0.35   Will need more hemodynamics    3  RBBB   No old EKG  4  COVID +   Pt is at least 5 day out   Was symptomatic prior     For questions or updates, please contact [facility] Please consult [email or web address] for contact info under   Pixie Casino, MD, FACC, [place] Director of the [facility]  [facility] Diplomate of the American Board of Clinical Lipidology Attending Cardiologist  Direct Dial: [phone]  Fax: [phone]  Website:  [email or web address]  Pixie Casino, MD  10/21/2021, 12:17  PM

## 2021-10-21 NOTE — TOC Progression Note (Signed)
Transition of Care [facility]) - Progression Note    Patient Details  Name: Amy [name] MRN: [id number] Date of Birth: Oct 08, 1946  [facility] CM/SW Contact  Purcell Mouton, RN Phone Number: 10/21/2021, 1:58 PM  Clinical Narrative:    Pt selected [place] for [place]. Referral given to in house rep with [facility].    Expected Discharge Plan: [place] Barriers to Discharge: No Barriers Identified  Expected Discharge Plan and Services Expected Discharge Plan: [place] arrangements for the past 2 months: Single Family Home                                       Social Determinants of Health (SDOH) Interventions    Readmission Risk Interventions No flowsheet data found.

## 2021-10-21 NOTE — Progress Notes (Signed)
Report given to Chester Holstein, RN at [facility]. All questions answered.

## 2021-10-21 NOTE — Progress Notes (Signed)
Report given to [facility], RN at [facility]. Bed request made. All questions answered. Awaiting for call back for when patient will be transferred

## 2021-10-21 NOTE — Progress Notes (Addendum)
PROGRESS NOTE    Amy [name]  ZWC:[id number] DOB: 05/01/1946 DOA: 10/14/2021 PCP: Shirline Frees, MD   Chief Complain: Shortness of breath  Brief Narrative: Patient is a 75 year old female with history of hyperlipidemia who presented with shortness of breath that started about 3 weeks ago.  She was also complaining of cough.  Cough did not improve with outpatient medication.  She was seen in the urgent care and sent to the emergency department.  COVID screen test came out to be positive.Chest imaging showed cardiomegaly, pulmonary vascular congestion.  Labs showed elevated BNP.  Patient was admitted for the management of possible new onset acute congestive heart failure.  She was started on IV Lasix.  Echocardiogram showed EF of 30 to 23%, grade 1 diastolic dysfunction, moderate to severe aortic stenosis.  These are all new findings. .  Cardiology consulted , plan for L/R heart cath.  Cardiology recommended to transfer her to [facility].  Assessment & Plan:   Principal Problem:   Acute CHF (congestive heart failure) (HCC)   Acute combined systolic/diastolic congestive heart failure: no history of CHF.  Presented with cough ,elevated BNP.  Chest x-ray suggestive of CHF.  Started on Lasix 40 mg twice daily after admission. She had bibasilar crackles but no peripheral edema. Echocardiogram showed EF of 30 to 53%, grade 1 diastolic dysfunction, moderate  aortic stenosis.  These are all new findings.  No previous echocardiogram found in system.  Cardiology consulted and following . Planning for L/R cardiac cath , plan for today but could not be done, so now postponed for tomorrow. Currently on torsemide.  Currently she appears euvolemic  COVID-19: Symptoms started about 3 weeks ago.  No pneumonia seen on the chest x-ray.  Currently not hypoxic.  No  indication for treatment.  Continue supportive care  Hyponatremia: Sodium in the range of 125 on presentation.  Possible from hypervolemic hyponatremia  from CHF.    Na has improved to the range of 130s today  Hyperlipidemia: On statin.  LDL of 120  Suspected left lower extremity cellulitis versus venous stasis: Started on cefazolin,changed  to keflex,finished thew course  Prediabetes: Hemoglobin A1c of 6.3.  Advised lifestyle changes and dietary modification  Morbid obesity: BMI of 39.6.  We recommended lifestyle changes / weight loss with healthy diet and regular exercise  Weakness: Patient was complaining of exertional dyspnea/weakness.  PT recommended HH        DVT prophylaxis: Lovenox Code Status: Full code Family Communication:Called and discussed with the daughter on phone on 10/18/21 Patient status:  Dispo: The patient is from: Home               Anticipated d/c is to: Home              Anticipated d/c date IR:WERXVQMG cath  Consultants: Cardiology  Procedures:None  Antimicrobials:  Anti-infectives (From admission, onward)    Start     Dose/Rate Route Frequency Ordered Stop   10/16/21 1400  cephALEXin (KEFLEX) capsule 500 mg        500 mg Oral Every 8 hours 10/16/21 1121 10/20/21 0540   10/15/21 2200  ceFAZolin (ANCEF) IVPB 1 g/50 mL premix  Status:  Discontinued        1 g 100 mL/hr over 30 Minutes Intravenous Every 8 hours 10/15/21 2040 10/16/21 1121       Subjective:  Patient seen and examined at the bedside this morning.  Hemodynamically stable ,no new  complaints ,waiting for cath.  She  has been on room air since last 2 days.  Denies any shortness of breath or cough.  Objective: Vitals:   10/20/21 1328 10/20/21 2121 10/21/21 0419 10/21/21 0424  BP: (!) 141/75 121/64 (!) 132/58   Pulse: 83 89 78   Resp: 14 17 18    Temp: 97.8 F (36.6 C) 97.8 F (36.6 C) 98.2 F (36.8 C)   TempSrc: Oral Oral Oral   SpO2: 95% 92% 91%   Weight:    84.3 kg  Height:        Intake/Output Summary (Last 24 hours) at 10/21/2021 1428 Last data filed at 10/21/2021 1244 Gross per 24 hour  Intake 126 ml  Output 2175 ml   Net -2049 ml   Filed Weights   10/19/21 0500 10/20/21 0540 10/21/21 0424  Weight: 84.6 kg 84.6 kg 84.3 kg    Examination:  General exam: Overall comfortable, not in distress,obese HEENT: PERRL Respiratory system: Diminished air sounds in the bases, no wheezes or crackles  Cardiovascular system: S1 & S2 heard, RRR.  Gastrointestinal system: Abdomen is nondistended, soft and nontender. Central nervous system: Alert and oriented Extremities: No edema, no clubbing ,no cyanosis Skin: Chronic venous stasis changes on bilateral lower extremities, no ulcers,no icterus        Data Reviewed: I have personally reviewed following labs and imaging studies  CBC: Recent Labs  Lab 10/15/21 0629 10/15/21 2125 10/16/21 0421 10/19/21 0503  WBC 13.5* 12.6* 11.1* 9.7  NEUTROABS 9.2*  --   --   --   HGB 13.8 14.1 12.7 14.1  HCT 39.8 40.9 37.4 42.8  MCV 92.6 92.5 95.2 95.1  PLT 345 324 302 423   Basic Metabolic Panel: Recent Labs  Lab 10/15/21 2125 10/16/21 0421 10/17/21 0357 10/18/21 0429 10/19/21 0503 10/20/21 0408 10/21/21 0406  NA 130* 126*  125* 128* 132* 131* 132* 132*  K 3.2* 3.0*  3.0* 3.6 3.8 4.3 4.1 4.2  CL 92* 92*  92* 93* 97* 99 98 97*  CO2 26 26  26 26 27 29 28 29   GLUCOSE 101* 97  97 113* 125* 103* 116* 127*  BUN 17 25*  25* 23 33* 28* 26* 31*  CREATININE 0.56  0.55 0.58  0.58 0.51 0.61 0.63 0.69 0.62  CALCIUM 8.4* 8.0*  7.9* 7.7* 8.3* 8.2* 8.3* 8.9  MG 2.2  --   --   --   --   --   --   PHOS 3.4 4.0  --   --   --   --   --    GFR: Estimated Creatinine Clearance: 56.8 mL/min (by C-G formula based on SCr of 0.62 mg/dL). Liver Function Tests: Recent Labs  Lab 10/15/21 0629 10/15/21 2125 10/16/21 0421  AST 24  --  21  ALT 24  --  24  ALKPHOS 55  --  51  BILITOT 0.9  --  0.9  PROT 8.0  --  6.7  ALBUMIN 3.8 3.8 3.3*  3.3*   No results for input(s): LIPASE, AMYLASE in the last 168 hours. No results for input(s): AMMONIA in the last 168  hours. Coagulation Profile: No results for input(s): INR, PROTIME in the last 168 hours. Cardiac Enzymes: No results for input(s): CKTOTAL, CKMB, CKMBINDEX, TROPONINI in the last 168 hours. BNP (last 3 results) No results for input(s): PROBNP in the last 8760 hours. HbA1C: No results for input(s): HGBA1C in the last 72 hours.  CBG: No results for input(s): GLUCAP in the last 168 hours.  Lipid Profile: Recent Labs    10/19/21 0503  CHOL 177  HDL 36*  LDLCALC 120*  TRIG 104  CHOLHDL 4.9   Thyroid Function Tests: No results for input(s): TSH, T4TOTAL, FREET4, T3FREE, THYROIDAB in the last 72 hours.  Anemia Panel: No results for input(s): VITAMINB12, FOLATE, FERRITIN, TIBC, IRON, RETICCTPCT in the last 72 hours. Sepsis Labs: No results for input(s): PROCALCITON, LATICACIDVEN in the last 168 hours.  Recent Results (from the past 240 hour(s))  Resp Panel by RT-PCR (Flu A&B, Covid) Nasopharyngeal Swab     Status: Abnormal   Collection Time: 10/14/21 11:53 PM   Specimen: Nasopharyngeal Swab; Nasopharyngeal(NP) swabs in vial transport medium  Result Value Ref Range Status   SARS Coronavirus 2 by RT PCR POSITIVE (A) NEGATIVE Final    Comment: CRITICAL RESULT CALLED TO, READ BACK BY AND VERIFIED WITH: TJ RIVERS RN 10/15/21 @ 0107 VS (NOTE) SARS-CoV-2 target nucleic acids are DETECTED.  The SARS-CoV-2 RNA is generally detectable in upper respiratory specimens during the acute phase of infection. Positive results are indicative of the presence of the identified virus, but do not rule out bacterial infection or co-infection with other pathogens not detected by the test. Clinical correlation with patient history and other diagnostic information is necessary to determine patient infection status. The expected result is Negative.  Fact Sheet for Patients: EntrepreneurPulse.com.au  Fact Sheet for Healthcare  Providers: IncredibleEmployment.be  This test is not yet approved or cleared by the [place] FDA and  has been authorized for detection and/or diagnosis of SARS-CoV-2 by FDA under an Emergency Use Authorization (EUA).  This EUA will remain in effect (meaning this test  can be used) for the duration of  the COVID-19 declaration under Section 564(b)(1) of the Act, 21 U.S.C. section 360bbb-3(b)(1), unless the authorization is terminated or revoked sooner.     Influenza A by PCR NEGATIVE NEGATIVE Final   Influenza B by PCR NEGATIVE NEGATIVE Final    Comment: (NOTE) The Xpert Xpress SARS-CoV-2/FLU/RSV plus assay is intended as an aid in the diagnosis of influenza from Nasopharyngeal swab specimens and should not be used as a sole basis for treatment. Nasal washings and aspirates are unacceptable for Xpert Xpress SARS-CoV-2/FLU/RSV testing.  Fact Sheet for Patients: EntrepreneurPulse.com.au  Fact Sheet for Healthcare Providers: IncredibleEmployment.be  This test is not yet approved or cleared by the [place] FDA and has been authorized for detection and/or diagnosis of SARS-CoV-2 by FDA under an Emergency Use Authorization (EUA). This EUA will remain in effect (meaning this test can be used) for the duration of the COVID-19 declaration under Section 564(b)(1) of the Act, 21 U.S.C. section 360bbb-3(b)(1), unless the authorization is terminated or revoked.  Performed at [facility], [place] [address], [place], [place] [address]   Culture, blood (routine x 2)     Status: None   Collection Time: 10/15/21  9:25 PM   Specimen: BLOOD  Result Value Ref Range Status   Specimen Description   Final    BLOOD LEFT ANTECUBITAL Performed at [facility] [address] [place], [place] [address]    Special Requests   Final    BOTTLES DRAWN AEROBIC ONLY Blood Culture adequate volume Performed  at [facility] [address], [place], [place] [address]    Culture   Final    NO GROWTH 5 DAYS Performed at [facility], [place] [address], [place], [place] [address]    Report Status 10/21/2021  FINAL  Final  Culture, blood (routine x 2)     Status: None   Collection Time: 10/15/21  9:25 PM   Specimen: BLOOD RIGHT HAND  Result Value Ref Range Status   Specimen Description   Final    BLOOD RIGHT HAND Performed at [facility] [address] [place], [place] [address]    Special Requests   Final    BOTTLES DRAWN AEROBIC ONLY Blood Culture adequate volume Performed at [facility] [address], [place], [place] [address]    Culture   Final    NO GROWTH 5 DAYS Performed at [facility], [place] [address], [place], [place] [address]    Report Status 10/21/2021 FINAL  Final  Surgical pcr screen     Status: None   Collection Time: 10/21/21  2:00 AM   Specimen: Nasal Mucosa; Nasal Swab  Result Value Ref Range Status   MRSA, PCR NEGATIVE NEGATIVE Final   Staphylococcus aureus NEGATIVE NEGATIVE Final    Comment: (NOTE) The Xpert SA Assay (FDA approved for NASAL specimens in patients 85 years of age and older), is one component of a comprehensive surveillance program. It is not intended to diagnose infection nor to guide or monitor treatment. Performed at [facility], [place] [address] [place], [place] [address]          Radiology Studies: CT Angio Chest Pulmonary Embolism (PE) W or WO Contrast  Result Date: 10/20/2021 CLINICAL DATA:  A 75 year old female presents for evaluation of chest pain or shortness of breath. EXAM: CT ANGIOGRAPHY CHEST WITH CONTRAST TECHNIQUE: Multidetector CT imaging of the chest was performed using the standard protocol during bolus administration of intravenous contrast. Multiplanar CT image reconstructions and MIPs were obtained to evaluate the vascular anatomy.  CONTRAST:  37mL OMNIPAQUE IOHEXOL 350 MG/ML SOLN COMPARISON:  Chest x-ray of October 14, 2021. FINDINGS: Cardiovascular: Calcified atherosclerotic plaque of the thoracic aorta. Tortuous great vessels in the chest. Limited assessment based on phase of contrast enhancement. Calcified coronary artery disease. Heart size moderately enlarged. No signs of suspecting antral pericardial effusion. Main pulmonary artery at 400 Hounsfield unit density. No signs of central, lobar or segmental level embolus with limited assessment particularly at the lung bases due to respiratory motion also general limited assessment of more peripheral vessels due to respiratory motion on the current study. Mediastinum/Nodes: Mildly patulous and diffusely mildly thickened esophagus. No adenopathy in the chest. Lungs/Pleura: Parenchymal assessment mildly limited by respiratory motion. No gross consolidative process. No signs of effusion or pneumothorax. Airways are patent. Upper Abdomen: Incidental imaging of upper abdominal contents without acute process. Musculoskeletal: No acute bone finding. No destructive bone process. Spinal degenerative changes. Review of the MIP images confirms the above findings. IMPRESSION: No signs of central, lobar or segmental level pulmonary embolus with limited assessment of more peripheral vessels due to respiratory motion on the current study. Cardiomegaly with coronary artery disease. Mildly patulous and diffusely mildly thickened esophagus. Correlate with any symptoms of esophageal dysmotility and or esophagitis. Aortic atherosclerosis. Aortic Atherosclerosis (ICD10-I70.0). Electronically Signed   By: Zetta Bills M.D.   On: 10/20/2021 18:00        Scheduled Meds:  diclofenac Sodium  4 g Topical QID   enoxaparin (LOVENOX) injection  40 mg Subcutaneous Q24H   guaiFENesin  600 mg Oral BID   metoprolol succinate  12.5 mg Oral Daily   potassium chloride  20 mEq Oral Daily   sodium chloride flush  3  mL Intravenous Q12H   torsemide  20 mg Oral Daily   Continuous Infusions:  sodium chloride     sodium chloride 10 mL/hr at 10/21/21 0926      LOS: 6 days    Time spent: 25 mins.More than 50% of that time was spent in counseling and/or coordination of care.      Shelly Coss, MD [facility] P12/03/2021, 2:28 PM

## 2021-10-22 ENCOUNTER — Encounter (HOSPITAL_COMMUNITY)
Admission: EM | Disposition: A | Discharge status: Home Health | Payer: Self-pay | Source: Home / Self Care | Attending: Internal Medicine

## 2021-10-22 DIAGNOSIS — U071 COVID-19: Secondary | ICD-10-CM

## 2021-10-22 DIAGNOSIS — I509 Heart failure, unspecified: Secondary | ICD-10-CM

## 2021-10-22 HISTORY — PX: RIGHT/LEFT HEART CATH AND CORONARY ANGIOGRAPHY: CATH118266

## 2021-10-22 LAB — POCT I-STAT EG7
Acid-Base Excess: 7 mmol/L — ABNORMAL HIGH (ref 0.0–2.0)
Bicarbonate: 33.2 mmol/L — ABNORMAL HIGH (ref 20.0–28.0)
Calcium, Ion: 1.22 mmol/L (ref 1.15–1.40)
HCT: 43 % (ref 36.0–46.0)
Hemoglobin: 14.6 g/dL (ref 12.0–15.0)
O2 Saturation: 70 %
Potassium: 4.5 mmol/L (ref 3.5–5.1)
Sodium: 137 mmol/L (ref 135–145)
TCO2: 35 mmol/L — ABNORMAL HIGH (ref 22–32)
pCO2, Ven: 52.8 mmHg (ref 44.0–60.0)
pH, Ven: 7.407 (ref 7.250–7.430)
pO2, Ven: 37 mmHg (ref 32.0–45.0)

## 2021-10-22 LAB — POCT I-STAT 7, (LYTES, BLD GAS, ICA,H+H)
Acid-Base Excess: 7 mmol/L — ABNORMAL HIGH (ref 0.0–2.0)
Bicarbonate: 33.3 mmol/L — ABNORMAL HIGH (ref 20.0–28.0)
Calcium, Ion: 1.2 mmol/L (ref 1.15–1.40)
HCT: 42 % (ref 36.0–46.0)
Hemoglobin: 14.3 g/dL (ref 12.0–15.0)
O2 Saturation: 98 %
Potassium: 4.4 mmol/L (ref 3.5–5.1)
Sodium: 136 mmol/L (ref 135–145)
TCO2: 35 mmol/L — ABNORMAL HIGH (ref 22–32)
pCO2 arterial: 50.7 mmHg — ABNORMAL HIGH (ref 32.0–48.0)
pH, Arterial: 7.426 (ref 7.350–7.450)
pO2, Arterial: 98 mmHg (ref 83.0–108.0)

## 2021-10-22 LAB — BASIC METABOLIC PANEL
Anion gap: 10 (ref 5–15)
BUN: 37 mg/dL — ABNORMAL HIGH (ref 8–23)
CO2: 28 mmol/L (ref 22–32)
Calcium: 9.3 mg/dL (ref 8.9–10.3)
Chloride: 95 mmol/L — ABNORMAL LOW (ref 98–111)
Creatinine, Ser: 1.03 mg/dL — ABNORMAL HIGH (ref 0.44–1.00)
GFR, Estimated: 57 mL/min — ABNORMAL LOW (ref >60)
Glucose, Bld: 171 mg/dL — ABNORMAL HIGH (ref 70–99)
Potassium: 4.2 mmol/L (ref 3.5–5.1)
Sodium: 133 mmol/L — ABNORMAL LOW (ref 135–145)

## 2021-10-22 LAB — CBC
HCT: 41.2 % (ref 36.0–46.0)
Hemoglobin: 13.7 g/dL (ref 12.0–15.0)
MCH: 31.3 pg (ref 26.0–34.0)
MCHC: 33.3 g/dL (ref 30.0–36.0)
MCV: 94.1 fL (ref 80.0–100.0)
Platelets: 409 10*3/uL — ABNORMAL HIGH (ref 150–400)
RBC: 4.38 MIL/uL (ref 3.87–5.11)
RDW: 14.3 % (ref 11.5–15.5)
WBC: 8.1 10*3/uL (ref 4.0–10.5)
nRBC: 0 % (ref 0.0–0.2)

## 2021-10-22 LAB — CREATININE, SERUM
Creatinine, Ser: 0.66 mg/dL (ref 0.44–1.00)
GFR, Estimated: >60 mL/min (ref >60)

## 2021-10-22 SURGERY — RIGHT/LEFT HEART CATH AND CORONARY ANGIOGRAPHY
Anesthesia: LOCAL

## 2021-10-22 MED ORDER — HEPARIN (PORCINE) IN NACL 2000-0.9 UNIT/L-% IV SOLN
INTRAVENOUS | Status: DC | PRN
  Administered 2021-10-22: 1000 mL

## 2021-10-22 MED ORDER — SODIUM CHLORIDE 0.9% FLUSH
3.0000 mL | Freq: Two times a day (BID) | INTRAVENOUS | Status: DC
  Administered 2021-10-23: 3 mL via INTRAVENOUS

## 2021-10-22 MED ORDER — ATORVASTATIN CALCIUM 80 MG PO TABS
80.0000 mg | ORAL_TABLET | Freq: Every day | ORAL | Status: DC
  Administered 2021-10-22 – 2021-10-23 (×2): 80 mg via ORAL
  Filled 2021-10-22 (×2): qty 1

## 2021-10-22 MED ORDER — HEPARIN SODIUM (PORCINE) 1000 UNIT/ML IJ SOLN
INTRAMUSCULAR | Status: DC | PRN
  Administered 2021-10-22: 4000 [IU] via INTRAVENOUS

## 2021-10-22 MED ORDER — MIDAZOLAM HCL 2 MG/2ML IJ SOLN
INTRAMUSCULAR | Status: AC
  Filled 2021-10-22: qty 2

## 2021-10-22 MED ORDER — VERAPAMIL HCL 2.5 MG/ML IV SOLN
INTRAVENOUS | Status: AC
  Filled 2021-10-22: qty 2

## 2021-10-22 MED ORDER — SODIUM CHLORIDE 0.9 % IV SOLN: 250.0000 mL | INTRAVENOUS | Status: DC | PRN

## 2021-10-22 MED ORDER — ONDANSETRON HCL 4 MG/2ML IJ SOLN: 4.0000 mg | Freq: Four times a day (QID) | INTRAMUSCULAR | Status: DC | PRN

## 2021-10-22 MED ORDER — MIDAZOLAM HCL 2 MG/2ML IJ SOLN
INTRAMUSCULAR | Status: DC | PRN
  Administered 2021-10-22: .5 mg via INTRAVENOUS

## 2021-10-22 MED ORDER — HEPARIN (PORCINE) IN NACL 2000-0.9 UNIT/L-% IV SOLN
INTRAVENOUS | Status: AC
  Filled 2021-10-22: qty 1000

## 2021-10-22 MED ORDER — LABETALOL HCL 5 MG/ML IV SOLN: 10.0000 mg | INTRAVENOUS | Status: AC | PRN

## 2021-10-22 MED ORDER — LIDOCAINE HCL (PF) 1 % IJ SOLN
INTRAMUSCULAR | Status: DC | PRN
  Administered 2021-10-22: 2 mL

## 2021-10-22 MED ORDER — FENTANYL CITRATE (PF) 100 MCG/2ML IJ SOLN
INTRAMUSCULAR | Status: DC | PRN
  Administered 2021-10-22: 25 ug via INTRAVENOUS

## 2021-10-22 MED ORDER — HEPARIN SODIUM (PORCINE) 1000 UNIT/ML IJ SOLN
INTRAMUSCULAR | Status: AC
  Filled 2021-10-22: qty 10

## 2021-10-22 MED ORDER — HYDRALAZINE HCL 20 MG/ML IJ SOLN: 10.0000 mg | INTRAMUSCULAR | Status: AC | PRN

## 2021-10-22 MED ORDER — VERAPAMIL HCL 2.5 MG/ML IV SOLN
INTRAVENOUS | Status: DC | PRN
  Administered 2021-10-22: 10 mL via INTRA_ARTERIAL

## 2021-10-22 MED ORDER — SODIUM CHLORIDE 0.9% FLUSH: 3.0000 mL | INTRAVENOUS | Status: DC | PRN

## 2021-10-22 MED ORDER — ASPIRIN 81 MG PO CHEW: 81.0000 mg | CHEWABLE_TABLET | Freq: Every day | ORAL | Status: DC

## 2021-10-22 MED ORDER — ACETAMINOPHEN 325 MG PO TABS
650.0000 mg | ORAL_TABLET | ORAL | Status: DC | PRN
  Administered 2021-10-22 – 2021-10-23 (×3): 650 mg via ORAL
  Filled 2021-10-22 (×2): qty 2

## 2021-10-22 MED ORDER — ENOXAPARIN SODIUM 40 MG/0.4ML IJ SOSY
40.0000 mg | PREFILLED_SYRINGE | INTRAMUSCULAR | Status: DC
  Administered 2021-10-23: 40 mg via SUBCUTANEOUS
  Filled 2021-10-22: qty 0.4

## 2021-10-22 MED ORDER — SODIUM CHLORIDE 0.9 % IV SOLN: INTRAVENOUS | Status: AC

## 2021-10-22 MED ORDER — FENTANYL CITRATE (PF) 100 MCG/2ML IJ SOLN
INTRAMUSCULAR | Status: AC
  Filled 2021-10-22: qty 2

## 2021-10-22 MED ORDER — LIDOCAINE HCL (PF) 1 % IJ SOLN
INTRAMUSCULAR | Status: AC
  Filled 2021-10-22: qty 30

## 2021-10-22 SURGICAL SUPPLY — 16 items
CATH 5FR JL3.5 JR4 ANG PIG MP (CATHETERS) ×1 IMPLANT
CATH BALLN WEDGE 5F 110CM (CATHETERS) ×1 IMPLANT
CATH INFINITI 5 FR IM (CATHETERS) ×1 IMPLANT
CATH LAUNCHER 5F RADR (CATHETERS) IMPLANT
CATHETER LAUNCHER 5F RADR (CATHETERS) ×2
DEVICE RAD COMP TR BAND LRG (VASCULAR PRODUCTS) ×1 IMPLANT
GLIDESHEATH SLEND A-KIT 6F 22G (SHEATH) ×1 IMPLANT
GUIDEWIRE ANGLED .035X150CM (WIRE) ×1 IMPLANT
GUIDEWIRE INQWIRE 1.5J.035X260 (WIRE) IMPLANT
INQWIRE 1.5J .035X260CM (WIRE) ×2
KIT HEART LEFT (KITS) ×2 IMPLANT
PACK CARDIAC CATHETERIZATION (CUSTOM PROCEDURE TRAY) ×2 IMPLANT
SHEATH GLIDE SLENDER 4/5FR (SHEATH) ×1 IMPLANT
TRANSDUCER W/STOPCOCK (MISCELLANEOUS) ×2 IMPLANT
TUBING CIL FLEX 10 FLL-RA (TUBING) ×2 IMPLANT
WIRE HI TORQ VERSACORE-J 145CM (WIRE) ×1 IMPLANT

## 2021-10-22 NOTE — H&P (View-Only) (Signed)
PROGRESS NOTE    Amy [name]  WYO:[id number] DOB: 1946-05-23 DOA: 10/14/2021 PCP: Shirline Frees, MD   Chief Complain: Shortness of breath  Brief Narrative:  Patient is a 75 year old female with history of hyperlipidemia who presented with shortness of breath that started about 3 weeks ago.  She was also complaining of cough.  Cough did not improve with outpatient medication.  She was seen in the urgent care and sent to the emergency department.  COVID screen test came out to be positive.Chest imaging showed cardiomegaly, pulmonary vascular congestion.  Labs showed elevated BNP.  Patient was admitted for the management of possible new onset acute congestive heart failure.  She was started on IV Lasix.  Echocardiogram showed EF of 30 to 77%, grade 1 diastolic dysfunction, moderate to severe aortic stenosis.  These are all new findings. .  Cardiology consulted , plan for L/R heart cath.  Cardiology recommended to transfer her to [facility].  Patient transferred to [facility] 12/6 for cardiac cath.  Assessment & Plan:   Principal Problem:   Acute CHF (congestive heart failure) (HCC) Active Problems:   COVID   Acute combined systolic/diastolic congestive heart failure:  no history of CHF.  Presented with cough ,elevated BNP.  Chest x-ray suggestive of CHF.  Started on Lasix 40 mg twice daily after admission. She had bibasilar crackles but no peripheral edema. Echocardiogram showed EF of 30 to 41%, grade 1 diastolic dysfunction, moderate  aortic stenosis.  These are all new findings.  No previous echocardiogram found in system.  Cardiology consulted and following . Planning for L/R cardiac cath , plan for today but could not be done, so now postponed for tomorrow. Recess per cardiology, initially on IV Lasix, currently patient to oral torsemide.  Currently she appears euvolemic  COVID-19: Symptoms started about 3 weeks ago.  No pneumonia seen on the chest x-ray.  Currently not hypoxic.  No   indication for treatment.  Continue supportive care -Positive test 11/28, will need total isolation for 10 days.  Hyponatremia: Sodium in the range of 125 on presentation.  Possible from hypervolemic hyponatremia from CHF.    Na has improved to the range of 130s today  Hyperlipidemia: On statin.  LDL of 120  Suspected left lower extremity cellulitis versus venous stasis: Started on cefazolin,changed  to keflex,finished thew course  Prediabetes: Hemoglobin A1c of 6.3.  Advised lifestyle changes and dietary modification  Morbid obesity: BMI of 39.6.  We recommended lifestyle changes / weight loss with healthy diet and regular exercise  Weakness: Patient was complaining of exertional dyspnea/weakness.  PT recommended HH        DVT prophylaxis: Lovenox Code Status: Full code Family Communication:None at bedside Patient status:  Dispo: The patient is from: Home               Anticipated d/c is to: Home              Anticipated d/c date OI:[facility] cath  Consultants: Cardiology  Procedures:None  Antimicrobials:  Anti-infectives (From admission, onward)    Start     Dose/Rate Route Frequency Ordered Stop   10/16/21 1400  cephALEXin (KEFLEX) capsule 500 mg        500 mg Oral Every 8 hours 10/16/21 1121 10/20/21 0540   10/15/21 2200  ceFAZolin (ANCEF) IVPB 1 g/50 mL premix  Status:  Discontinued        1 g 100 mL/hr over 30 Minutes Intravenous Every 8 hours 10/15/21 2040 10/16/21 1121  Subjective:  She denies any complaints today, no nausea, no vomiting, no shortness of breath or chest pain  Objective: Vitals:   10/21/21 1815 10/21/21 2132 10/22/21 0432 10/22/21 1022  BP: 133/61 136/66 (!) 131/58 (!) 145/65  Pulse: 85 82 78 82  Resp: 18     Temp: 97.8 F (36.6 C) 98.1 F (36.7 C) 97.6 F (36.4 C)   TempSrc: Oral Oral Oral   SpO2: 96% 95% 95%   Weight: 82.6 kg  83.3 kg   Height: 4\' 10"  (1.473 m)       Intake/Output Summary (Last 24 hours) at 10/22/2021  1319 Last data filed at 10/22/2021 0527 Gross per 24 hour  Intake 54.84 ml  Output 300 ml  Net -245.16 ml   Filed Weights   10/21/21 0424 10/21/21 1815 10/22/21 0432  Weight: 84.3 kg 82.6 kg 83.3 kg    Examination:  Awake Alert, Oriented X 3, No new F.N deficits, Normal affect Symmetrical Chest wall movement, Good air movement bilaterally, CTAB RRR,No Gallops,Rubs or new Murmurs, No Parasternal Heave +ve B.Sounds, Abd Soft, No tenderness, No rebound - guarding or rigidity. No Cyanosis, neck lower extremity skin changes eczema, mainly in the left side.     Data Reviewed: I have personally reviewed following labs and imaging studies  CBC: Recent Labs  Lab 10/15/21 2125 10/16/21 0421 10/19/21 0503  WBC 12.6* 11.1* 9.7  HGB 14.1 12.7 14.1  HCT 40.9 37.4 42.8  MCV 92.5 95.2 95.1  PLT 324 302 979   Basic Metabolic Panel: Recent Labs  Lab 10/15/21 2125 10/16/21 0421 10/17/21 0357 10/18/21 0429 10/19/21 0503 10/20/21 0408 10/21/21 0406 10/22/21 0208  NA 130* 126*  125*   < > 132* 131* 132* 132* 133*  K 3.2* 3.0*  3.0*   < > 3.8 4.3 4.1 4.2 4.2  CL 92* 92*  92*   < > 97* 99 98 97* 95*  CO2 26 26  26    < > 27 29 28 29 28   GLUCOSE 101* 97  97   < > 125* 103* 116* 127* 171*  BUN 17 25*  25*   < > 33* 28* 26* 31* 37*  CREATININE 0.56  0.55 0.58  0.58   < > 0.61 0.63 0.69 0.62 1.03*  CALCIUM 8.4* 8.0*  7.9*   < > 8.3* 8.2* 8.3* 8.9 9.3  MG 2.2  --   --   --   --   --   --   --   PHOS 3.4 4.0  --   --   --   --   --   --    < > = values in this interval not displayed.   GFR: Estimated Creatinine Clearance: 43.8 mL/min (A) (by C-G formula based on SCr of 1.03 mg/dL (H)). Liver Function Tests: Recent Labs  Lab 10/15/21 2125 10/16/21 0421  AST  --  21  ALT  --  24  ALKPHOS  --  51  BILITOT  --  0.9  PROT  --  6.7  ALBUMIN 3.8 3.3*  3.3*   No results for input(s): LIPASE, AMYLASE in the last 168 hours. No results for input(s): AMMONIA in the last 168  hours. Coagulation Profile: No results for input(s): INR, PROTIME in the last 168 hours. Cardiac Enzymes: No results for input(s): CKTOTAL, CKMB, CKMBINDEX, TROPONINI in the last 168 hours. BNP (last 3 results) No results for input(s): PROBNP in the last 8760 hours. HbA1C: No results for input(s):  HGBA1C in the last 72 hours.  CBG: No results for input(s): GLUCAP in the last 168 hours. Lipid Profile: No results for input(s): CHOL, HDL, LDLCALC, TRIG, CHOLHDL, LDLDIRECT in the last 72 hours.  Thyroid Function Tests: No results for input(s): TSH, T4TOTAL, FREET4, T3FREE, THYROIDAB in the last 72 hours.  Anemia Panel: No results for input(s): VITAMINB12, FOLATE, FERRITIN, TIBC, IRON, RETICCTPCT in the last 72 hours. Sepsis Labs: No results for input(s): PROCALCITON, LATICACIDVEN in the last 168 hours.  Recent Results (from the past 240 hour(s))  Resp Panel by RT-PCR (Flu A&B, Covid) Nasopharyngeal Swab     Status: Abnormal   Collection Time: 10/14/21 11:53 PM   Specimen: Nasopharyngeal Swab; Nasopharyngeal(NP) swabs in vial transport medium  Result Value Ref Range Status   SARS Coronavirus 2 by RT PCR POSITIVE (A) NEGATIVE Final    Comment: CRITICAL RESULT CALLED TO, READ BACK BY AND VERIFIED WITH: TJ RIVERS RN 10/15/21 @ 0107 VS (NOTE) SARS-CoV-2 target nucleic acids are DETECTED.  The SARS-CoV-2 RNA is generally detectable in upper respiratory specimens during the acute phase of infection. Positive results are indicative of the presence of the identified virus, but do not rule out bacterial infection or co-infection with other pathogens not detected by the test. Clinical correlation with patient history and other diagnostic information is necessary to determine patient infection status. The expected result is Negative.  Fact Sheet for Patients: EntrepreneurPulse.com.au  Fact Sheet for Healthcare Providers: IncredibleEmployment.be  This  test is not yet approved or cleared by the [place] FDA and  has been authorized for detection and/or diagnosis of SARS-CoV-2 by FDA under an Emergency Use Authorization (EUA).  This EUA will remain in effect (meaning this test  can be used) for the duration of  the COVID-19 declaration under Section 564(b)(1) of the Act, 21 U.S.C. section 360bbb-3(b)(1), unless the authorization is terminated or revoked sooner.     Influenza A by PCR NEGATIVE NEGATIVE Final   Influenza B by PCR NEGATIVE NEGATIVE Final    Comment: (NOTE) The Xpert Xpress SARS-CoV-2/FLU/RSV plus assay is intended as an aid in the diagnosis of influenza from Nasopharyngeal swab specimens and should not be used as a sole basis for treatment. Nasal washings and aspirates are unacceptable for Xpert Xpress SARS-CoV-2/FLU/RSV testing.  Fact Sheet for Patients: EntrepreneurPulse.com.au  Fact Sheet for Healthcare Providers: IncredibleEmployment.be  This test is not yet approved or cleared by the [place] FDA and has been authorized for detection and/or diagnosis of SARS-CoV-2 by FDA under an Emergency Use Authorization (EUA). This EUA will remain in effect (meaning this test can be used) for the duration of the COVID-19 declaration under Section 564(b)(1) of the Act, 21 U.S.C. section 360bbb-3(b)(1), unless the authorization is terminated or revoked.  Performed at [facility], [place] [address] [place], [place] [address]   Culture, blood (routine x 2)     Status: None   Collection Time: 10/15/21  9:25 PM   Specimen: BLOOD  Result Value Ref Range Status   Specimen Description   Final    BLOOD LEFT ANTECUBITAL Performed at [place] [address] [place], [place] [address]    Special Requests   Final    BOTTLES DRAWN AEROBIC ONLY Blood Culture adequate volume Performed at [place] [address] [place] [address],  [place], [place] [address]    Culture   Final    NO GROWTH 5 DAYS Performed at [facility], [place]  [address] [place], [place] [address]    Report Status 10/21/2021 FINAL  Final  Culture, blood (routine x 2)     Status: None   Collection Time: 10/15/21  9:25 PM   Specimen: BLOOD RIGHT HAND  Result Value Ref Range Status   Specimen Description   Final    BLOOD RIGHT HAND Performed at [place] [address], [place], [place] [address]    Special Requests   Final    BOTTLES DRAWN AEROBIC ONLY Blood Culture adequate volume Performed at [facility] [address] [place], [place] [address]    Culture   Final    NO GROWTH 5 DAYS Performed at [facility], [place] [address], [place], [place] [address]    Report Status 10/21/2021 FINAL  Final  Surgical pcr screen     Status: None   Collection Time: 10/21/21  2:00 AM   Specimen: Nasal Mucosa; Nasal Swab  Result Value Ref Range Status   MRSA, PCR NEGATIVE NEGATIVE Final   Staphylococcus aureus NEGATIVE NEGATIVE Final    Comment: (NOTE) The Xpert SA Assay (FDA approved for NASAL specimens in patients 37 years of age and older), is one component of a comprehensive surveillance program. It is not intended to diagnose infection nor to guide or monitor treatment. Performed at [facility], [place] [address], [place],  [address]          Radiology Studies: CT Angio Chest Pulmonary Embolism (PE) W or WO Contrast  Result Date: 10/20/2021 CLINICAL DATA:  A 75 year old female presents for evaluation of chest pain or shortness of breath. EXAM: CT ANGIOGRAPHY CHEST WITH CONTRAST TECHNIQUE: Multidetector CT imaging of the chest was performed using the standard protocol during bolus administration of intravenous contrast. Multiplanar CT image reconstructions and MIPs were obtained to evaluate the vascular anatomy. CONTRAST:  14mL OMNIPAQUE IOHEXOL 350 MG/ML SOLN  COMPARISON:  Chest x-ray of October 14, 2021. FINDINGS: Cardiovascular: Calcified atherosclerotic plaque of the thoracic aorta. Tortuous great vessels in the chest. Limited assessment based on phase of contrast enhancement. Calcified coronary artery disease. Heart size moderately enlarged. No signs of suspecting antral pericardial effusion. Main pulmonary artery at 400 Hounsfield unit density. No signs of central, lobar or segmental level embolus with limited assessment particularly at the lung bases due to respiratory motion also general limited assessment of more peripheral vessels due to respiratory motion on the current study. Mediastinum/Nodes: Mildly patulous and diffusely mildly thickened esophagus. No adenopathy in the chest. Lungs/Pleura: Parenchymal assessment mildly limited by respiratory motion. No gross consolidative process. No signs of effusion or pneumothorax. Airways are patent. Upper Abdomen: Incidental imaging of upper abdominal contents without acute process. Musculoskeletal: No acute bone finding. No destructive bone process. Spinal degenerative changes. Review of the MIP images confirms the above findings. IMPRESSION: No signs of central, lobar or segmental level pulmonary embolus with limited assessment of more peripheral vessels due to respiratory motion on the current study. Cardiomegaly with coronary artery disease. Mildly patulous and diffusely mildly thickened esophagus. Correlate with any symptoms of esophageal dysmotility and or esophagitis. Aortic atherosclerosis. Aortic Atherosclerosis (ICD10-I70.0). Electronically Signed   By: Zetta Bills M.D.   On: 10/20/2021 18:00        Scheduled Meds:  aspirin  81 mg Oral Daily   diclofenac Sodium  4 g Topical QID   enoxaparin (LOVENOX) injection  40 mg Subcutaneous Q24H   metoprolol succinate  12.5 mg Oral Daily   potassium chloride  20 mEq Oral Daily   sodium chloride flush  3 mL Intravenous Q12H   torsemide  20 mg Oral Daily    Continuous Infusions:  sodium chloride     sodium chloride 10 mL/hr at 10/21/21 0926      LOS: 7 days          Phillips Climes, MD [facility] P12/04/2021, 1:19 PM

## 2021-10-22 NOTE — CV Procedure (Signed)
50 to 70% proximal RCA.  RCA is dominant.  60% distal RCA. Left main is widely patent LAD beyond the first diagonal forms a [place] 010 lesion with LAD containing 60% stenosis. Ostial OM1 90% Moderate global hypokinesis with EF 40%.  Filling pressures are normal. Right heart pressures were normal.  Recommend guideline directed therapy for global left ventricular systolic dysfunction.  Preventive therapy for progression of coronary disease and consider PCI if develops symptoms of angina, otherwise follow medically.

## 2021-10-22 NOTE — Progress Notes (Signed)
PROGRESS NOTE    Amy [name]  PRF:[id number] DOB: 1945-12-21 DOA: 10/14/2021 PCP: Shirline Frees, MD   Chief Complain: Shortness of breath  Brief Narrative:  Patient is a 75 year old female with history of hyperlipidemia who presented with shortness of breath that started about 3 weeks ago.  She was also complaining of cough.  Cough did not improve with outpatient medication.  She was seen in the urgent care and sent to the emergency department.  COVID screen test came out to be positive.Chest imaging showed cardiomegaly, pulmonary vascular congestion.  Labs showed elevated BNP.  Patient was admitted for the management of possible new onset acute congestive heart failure.  She was started on IV Lasix.  Echocardiogram showed EF of 30 to 93%, grade 1 diastolic dysfunction, moderate to severe aortic stenosis.  These are all new findings. .  Cardiology consulted , plan for L/R heart cath.  Cardiology recommended to transfer her to [facility].  Patient transferred to [facility] 12/6 for cardiac cath.  Assessment & Plan:   Principal Problem:   Acute CHF (congestive heart failure) (HCC) Active Problems:   COVID   Acute combined systolic/diastolic congestive heart failure:  no history of CHF.  Presented with cough ,elevated BNP.  Chest x-ray suggestive of CHF.  Started on Lasix 40 mg twice daily after admission. She had bibasilar crackles but no peripheral edema. Echocardiogram showed EF of 30 to 57%, grade 1 diastolic dysfunction, moderate  aortic stenosis.  These are all new findings.  No previous echocardiogram found in system.  Cardiology consulted and following . Planning for L/R cardiac cath , plan for today but could not be done, so now postponed for tomorrow. Recess per cardiology, initially on IV Lasix, currently patient to oral torsemide.  Currently she appears euvolemic  COVID-19: Symptoms started about 3 weeks ago.  No pneumonia seen on the chest x-ray.  Currently not hypoxic.  No   indication for treatment.  Continue supportive care -Positive test 11/28, will need total isolation for 10 days.  Hyponatremia: Sodium in the range of 125 on presentation.  Possible from hypervolemic hyponatremia from CHF.    Na has improved to the range of 130s today  Hyperlipidemia: On statin.  LDL of 120  Suspected left lower extremity cellulitis versus venous stasis: Started on cefazolin,changed  to keflex,finished thew course  Prediabetes: Hemoglobin A1c of 6.3.  Advised lifestyle changes and dietary modification  Morbid obesity: BMI of 39.6.  We recommended lifestyle changes / weight loss with healthy diet and regular exercise  Weakness: Patient was complaining of exertional dyspnea/weakness.  PT recommended HH        DVT prophylaxis: Lovenox Code Status: Full code Family Communication:None at bedside Patient status:  Dispo: The patient is from: Home               Anticipated d/c is to: Home              Anticipated d/c date SV:[id number] cath  Consultants: Cardiology  Procedures:None  Antimicrobials:  Anti-infectives (From admission, onward)    Start     Dose/Rate Route Frequency Ordered Stop   10/16/21 1400  cephALEXin (KEFLEX) capsule 500 mg        500 mg Oral Every 8 hours 10/16/21 1121 10/20/21 0540   10/15/21 2200  ceFAZolin (ANCEF) IVPB 1 g/50 mL premix  Status:  Discontinued        1 g 100 mL/hr over 30 Minutes Intravenous Every 8 hours 10/15/21 2040 10/16/21 1121  Subjective:  She denies any complaints today, no nausea, no vomiting, no shortness of breath or chest pain  Objective: Vitals:   10/21/21 1815 10/21/21 2132 10/22/21 0432 10/22/21 1022  BP: 133/61 136/66 (!) 131/58 (!) 145/65  Pulse: 85 82 78 82  Resp: 18     Temp: 97.8 F (36.6 C) 98.1 F (36.7 C) 97.6 F (36.4 C)   TempSrc: Oral Oral Oral   SpO2: 96% 95% 95%   Weight: 82.6 kg  83.3 kg   Height: 4\' 10"  (1.473 m)       Intake/Output Summary (Last 24 hours) at 10/22/2021  1319 Last data filed at 10/22/2021 0527 Gross per 24 hour  Intake 54.84 ml  Output 300 ml  Net -245.16 ml   Filed Weights   10/21/21 0424 10/21/21 1815 10/22/21 0432  Weight: 84.3 kg 82.6 kg 83.3 kg    Examination:  Awake Alert, Oriented X 3, No new F.N deficits, Normal affect Symmetrical Chest wall movement, Good air movement bilaterally, CTAB RRR,No Gallops,Rubs or new Murmurs, No Parasternal Heave +ve B.Sounds, Abd Soft, No tenderness, No rebound - guarding or rigidity. No Cyanosis, neck lower extremity skin changes eczema, mainly in the left side.     Data Reviewed: I have personally reviewed following labs and imaging studies  CBC: Recent Labs  Lab 10/15/21 2125 10/16/21 0421 10/19/21 0503  WBC 12.6* 11.1* 9.7  HGB 14.1 12.7 14.1  HCT 40.9 37.4 42.8  MCV 92.5 95.2 95.1  PLT 324 302 841   Basic Metabolic Panel: Recent Labs  Lab 10/15/21 2125 10/16/21 0421 10/17/21 0357 10/18/21 0429 10/19/21 0503 10/20/21 0408 10/21/21 0406 10/22/21 0208  NA 130* 126*  125*   < > 132* 131* 132* 132* 133*  K 3.2* 3.0*  3.0*   < > 3.8 4.3 4.1 4.2 4.2  CL 92* 92*  92*   < > 97* 99 98 97* 95*  CO2 26 26  26    < > 27 29 28 29 28   GLUCOSE 101* 97  97   < > 125* 103* 116* 127* 171*  BUN 17 25*  25*   < > 33* 28* 26* 31* 37*  CREATININE 0.56  0.55 0.58  0.58   < > 0.61 0.63 0.69 0.62 1.03*  CALCIUM 8.4* 8.0*  7.9*   < > 8.3* 8.2* 8.3* 8.9 9.3  MG 2.2  --   --   --   --   --   --   --   PHOS 3.4 4.0  --   --   --   --   --   --    < > = values in this interval not displayed.   GFR: Estimated Creatinine Clearance: 43.8 mL/min (A) (by C-G formula based on SCr of 1.03 mg/dL (H)). Liver Function Tests: Recent Labs  Lab 10/15/21 2125 10/16/21 0421  AST  --  21  ALT  --  24  ALKPHOS  --  51  BILITOT  --  0.9  PROT  --  6.7  ALBUMIN 3.8 3.3*  3.3*   No results for input(s): LIPASE, AMYLASE in the last 168 hours. No results for input(s): AMMONIA in the last 168  hours. Coagulation Profile: No results for input(s): INR, PROTIME in the last 168 hours. Cardiac Enzymes: No results for input(s): CKTOTAL, CKMB, CKMBINDEX, TROPONINI in the last 168 hours. BNP (last 3 results) No results for input(s): PROBNP in the last 8760 hours. HbA1C: No results for input(s):  HGBA1C in the last 72 hours.  CBG: No results for input(s): GLUCAP in the last 168 hours. Lipid Profile: No results for input(s): CHOL, HDL, LDLCALC, TRIG, CHOLHDL, LDLDIRECT in the last 72 hours.  Thyroid Function Tests: No results for input(s): TSH, T4TOTAL, FREET4, T3FREE, THYROIDAB in the last 72 hours.  Anemia Panel: No results for input(s): VITAMINB12, FOLATE, FERRITIN, TIBC, IRON, RETICCTPCT in the last 72 hours. Sepsis Labs: No results for input(s): PROCALCITON, LATICACIDVEN in the last 168 hours.  Recent Results (from the past 240 hour(s))  Resp Panel by RT-PCR (Flu A&B, Covid) Nasopharyngeal Swab     Status: Abnormal   Collection Time: 10/14/21 11:53 PM   Specimen: Nasopharyngeal Swab; Nasopharyngeal(NP) swabs in vial transport medium  Result Value Ref Range Status   SARS Coronavirus 2 by RT PCR POSITIVE (A) NEGATIVE Final    Comment: CRITICAL RESULT CALLED TO, READ BACK BY AND VERIFIED WITH: TJ RIVERS RN 10/15/21 @ 0107 VS (NOTE) SARS-CoV-2 target nucleic acids are DETECTED.  The SARS-CoV-2 RNA is generally detectable in upper respiratory specimens during the acute phase of infection. Positive results are indicative of the presence of the identified virus, but do not rule out bacterial infection or co-infection with other pathogens not detected by the test. Clinical correlation with patient history and other diagnostic information is necessary to determine patient infection status. The expected result is Negative.  Fact Sheet for Patients: EntrepreneurPulse.com.au  Fact Sheet for Healthcare Providers: IncredibleEmployment.be  This  test is not yet approved or cleared by the [place] FDA and  has been authorized for detection and/or diagnosis of SARS-CoV-2 by FDA under an Emergency Use Authorization (EUA).  This EUA will remain in effect (meaning this test  can be used) for the duration of  the COVID-19 declaration under Section 564(b)(1) of the Act, 21 U.S.C. section 360bbb-3(b)(1), unless the authorization is terminated or revoked sooner.     Influenza A by PCR NEGATIVE NEGATIVE Final   Influenza B by PCR NEGATIVE NEGATIVE Final    Comment: (NOTE) The Xpert Xpress SARS-CoV-2/FLU/RSV plus assay is intended as an aid in the diagnosis of influenza from Nasopharyngeal swab specimens and should not be used as a sole basis for treatment. Nasal washings and aspirates are unacceptable for Xpert Xpress SARS-CoV-2/FLU/RSV testing.  Fact Sheet for Patients: EntrepreneurPulse.com.au  Fact Sheet for Healthcare Providers: IncredibleEmployment.be  This test is not yet approved or cleared by the [place] FDA and has been authorized for detection and/or diagnosis of SARS-CoV-2 by FDA under an Emergency Use Authorization (EUA). This EUA will remain in effect (meaning this test can be used) for the duration of the COVID-19 declaration under Section 564(b)(1) of the Act, 21 U.S.C. section 360bbb-3(b)(1), unless the authorization is terminated or revoked.  Performed at [facility], [place] [address], [place], [place] [address]   Culture, blood (routine x 2)     Status: None   Collection Time: 10/15/21  9:25 PM   Specimen: BLOOD  Result Value Ref Range Status   Specimen Description   Final    BLOOD LEFT ANTECUBITAL Performed at [facility] [address], [place], [place] [address]    Special Requests   Final    BOTTLES DRAWN AEROBIC ONLY Blood Culture adequate volume Performed at [facility] [address],  [place], [place] [address]    Culture   Final    NO GROWTH 5 DAYS Performed at [facility], [place]  [address], [place], [place] [address]    Report Status 10/21/2021 FINAL  Final  Culture, blood (routine x 2)     Status: None   Collection Time: 10/15/21  9:25 PM   Specimen: BLOOD RIGHT HAND  Result Value Ref Range Status   Specimen Description   Final    BLOOD RIGHT HAND Performed at [facility] [address], [place], [place] [address]    Special Requests   Final    BOTTLES DRAWN AEROBIC ONLY Blood Culture adequate volume Performed at [facility] [address], [place], [place] [address]    Culture   Final    NO GROWTH 5 DAYS Performed at [facility], [place] [address], [place], [place] [address]    Report Status 10/21/2021 FINAL  Final  Surgical pcr screen     Status: None   Collection Time: 10/21/21  2:00 AM   Specimen: Nasal Mucosa; Nasal Swab  Result Value Ref Range Status   MRSA, PCR NEGATIVE NEGATIVE Final   Staphylococcus aureus NEGATIVE NEGATIVE Final    Comment: (NOTE) The Xpert SA Assay (FDA approved for NASAL specimens in patients 83 years of age and older), is one component of a comprehensive surveillance program. It is not intended to diagnose infection nor to guide or monitor treatment. Performed at [facility], [place] [address] [place], [place] [address]          Radiology Studies: CT Angio Chest Pulmonary Embolism (PE) W or WO Contrast  Result Date: 10/20/2021 CLINICAL DATA:  A 75 year old female presents for evaluation of chest pain or shortness of breath. EXAM: CT ANGIOGRAPHY CHEST WITH CONTRAST TECHNIQUE: Multidetector CT imaging of the chest was performed using the standard protocol during bolus administration of intravenous contrast. Multiplanar CT image reconstructions and MIPs were obtained to evaluate the vascular anatomy. CONTRAST:  43mL OMNIPAQUE IOHEXOL 350 MG/ML SOLN  COMPARISON:  Chest x-ray of October 14, 2021. FINDINGS: Cardiovascular: Calcified atherosclerotic plaque of the thoracic aorta. Tortuous great vessels in the chest. Limited assessment based on phase of contrast enhancement. Calcified coronary artery disease. Heart size moderately enlarged. No signs of suspecting antral pericardial effusion. Main pulmonary artery at 400 Hounsfield unit density. No signs of central, lobar or segmental level embolus with limited assessment particularly at the lung bases due to respiratory motion also general limited assessment of more peripheral vessels due to respiratory motion on the current study. Mediastinum/Nodes: Mildly patulous and diffusely mildly thickened esophagus. No adenopathy in the chest. Lungs/Pleura: Parenchymal assessment mildly limited by respiratory motion. No gross consolidative process. No signs of effusion or pneumothorax. Airways are patent. Upper Abdomen: Incidental imaging of upper abdominal contents without acute process. Musculoskeletal: No acute bone finding. No destructive bone process. Spinal degenerative changes. Review of the MIP images confirms the above findings. IMPRESSION: No signs of central, lobar or segmental level pulmonary embolus with limited assessment of more peripheral vessels due to respiratory motion on the current study. Cardiomegaly with coronary artery disease. Mildly patulous and diffusely mildly thickened esophagus. Correlate with any symptoms of esophageal dysmotility and or esophagitis. Aortic atherosclerosis. Aortic Atherosclerosis (ICD10-I70.0). Electronically Signed   By: Zetta Bills M.D.   On: 10/20/2021 18:00        Scheduled Meds:  aspirin  81 mg Oral Daily   diclofenac Sodium  4 g Topical QID   enoxaparin (LOVENOX) injection  40 mg Subcutaneous Q24H   metoprolol succinate  12.5 mg Oral Daily   potassium chloride  20 mEq Oral Daily   sodium chloride flush  3 mL Intravenous Q12H   torsemide  20 mg Oral Daily    Continuous Infusions:  sodium chloride     sodium chloride 10 mL/hr at 10/21/21 0926      LOS: 7 days          Phillips Climes, MD [facility] P12/04/2021, 1:19 PM

## 2021-10-22 NOTE — Progress Notes (Signed)
PT Cancellation Note  Patient Details Name: Amy [name] MRN: [id number] DOB: February 13, 1946   Cancelled Treatment:    Reason Eval/Treat Not Completed: Other (comment).  Pt was seen for attempted therapy and pt reports she is waiting for her heart cath and does not want to get OOB right now.  Follow up with her at another time.   Ramond Dial 10/22/2021, 2:40 PM  Mee Hives, PT PhD Acute Rehab Dept. Number: [place] [facility] [place]

## 2021-10-22 NOTE — Interval H&P Note (Signed)
Cath Lab Visit (complete for each Cath Lab visit)  Clinical Evaluation Leading to the Procedure:   ACS: No.  Non-ACS:    Anginal Classification: CCS II  Anti-ischemic medical therapy: Minimal Therapy (1 class of medications)  Non-Invasive Test Results: Intermediate-risk stress test findings: cardiac mortality 1-3%/year  Prior CABG: No previous CABG      History and Physical Interval Note:  10/22/2021 3:10 PM  Amy [name]  has presented today for surgery, with the diagnosis of new onset heart failure.  The various methods of treatment have been discussed with the patient and family. After consideration of risks, benefits and other options for treatment, the patient has consented to  Procedure(s): RIGHT/LEFT HEART CATH AND CORONARY ANGIOGRAPHY (N/A) as a surgical intervention.  The patient's history has been reviewed, patient examined, no change in status, stable for surgery.  I have reviewed the patient's chart and labs.  Questions were answered to the patient's satisfaction.     Belva Crome III

## 2021-10-23 ENCOUNTER — Encounter (HOSPITAL_COMMUNITY): Payer: Self-pay | Admitting: Interventional Cardiology

## 2021-10-23 DIAGNOSIS — I2510 Atherosclerotic heart disease of native coronary artery without angina pectoris: Secondary | ICD-10-CM | POA: Diagnosis not present

## 2021-10-23 DIAGNOSIS — U071 COVID-19: Secondary | ICD-10-CM | POA: Diagnosis not present

## 2021-10-23 DIAGNOSIS — I350 Nonrheumatic aortic (valve) stenosis: Secondary | ICD-10-CM | POA: Diagnosis not present

## 2021-10-23 DIAGNOSIS — I5021 Acute systolic (congestive) heart failure: Secondary | ICD-10-CM | POA: Diagnosis not present

## 2021-10-23 LAB — BASIC METABOLIC PANEL
Anion gap: 12 (ref 5–15)
BUN: 33 mg/dL — ABNORMAL HIGH (ref 8–23)
CO2: 25 mmol/L (ref 22–32)
Calcium: 9 mg/dL (ref 8.9–10.3)
Chloride: 96 mmol/L — ABNORMAL LOW (ref 98–111)
Creatinine, Ser: 0.7 mg/dL (ref 0.44–1.00)
GFR, Estimated: >60 mL/min (ref >60)
Glucose, Bld: 109 mg/dL — ABNORMAL HIGH (ref 70–99)
Potassium: 4.8 mmol/L (ref 3.5–5.1)
Sodium: 133 mmol/L — ABNORMAL LOW (ref 135–145)

## 2021-10-23 MED ORDER — LOSARTAN POTASSIUM 25 MG PO TABS
25.0000 mg | ORAL_TABLET | Freq: Every day | ORAL | Status: DC
  Administered 2021-10-23: 25 mg via ORAL
  Filled 2021-10-23: qty 1

## 2021-10-23 MED ORDER — ATORVASTATIN CALCIUM 80 MG PO TABS: 80.0000 mg | ORAL_TABLET | Freq: Every day | ORAL | 0 refills | Status: AC

## 2021-10-23 MED ORDER — LOSARTAN POTASSIUM 25 MG PO TABS: 25.0000 mg | ORAL_TABLET | Freq: Every day | ORAL | 0 refills | Status: DC

## 2021-10-23 MED ORDER — TORSEMIDE 20 MG PO TABS: 20.0000 mg | ORAL_TABLET | Freq: Every day | ORAL | 0 refills | Status: DC

## 2021-10-23 MED ORDER — METOPROLOL SUCCINATE ER 25 MG PO TB24: 12.5000 mg | ORAL_TABLET | Freq: Every day | ORAL | 0 refills | Status: DC

## 2021-10-23 NOTE — Care Management Important Message (Signed)
Important Message  Patient Details  Name: Amy [name] MRN: [id number] Date of Birth: Oct 16, 1946   Medicare Important Message Given:  Yes     Shelda Altes 10/23/2021, 10:21 AM

## 2021-10-23 NOTE — Progress Notes (Signed)
Progress Note  Patient Name: Amy [name] Date of Encounter: 10/23/2021  [facility] Cardiologist: Buford Dresser, MD  Subjective   Cath yesterday showed moderate to severe stenoses of the LAD, RCA and OM1. LVEF 35-45%, moderate AS, mean gradient 11 mmHg, normal right and left heart filling pressures. Medical therapy was recommended. Overall she feels well and wants to go home.  Inpatient Medications    Scheduled Meds:  aspirin  81 mg Oral Daily   atorvastatin  80 mg Oral Daily   diclofenac Sodium  4 g Topical QID   enoxaparin (LOVENOX) injection  40 mg Subcutaneous Q24H   metoprolol succinate  12.5 mg Oral Daily   potassium chloride  20 mEq Oral Daily   sodium chloride flush  3 mL Intravenous Q12H   torsemide  20 mg Oral Daily   Continuous Infusions:   Vital Signs    Vitals:   10/22/21 2327 10/23/21 0426 10/23/21 0500 10/23/21 0822  BP: 139/63 (!) 142/66  (!) 152/69  Pulse:  82  93  Resp:  16    Temp:  98.2 F (36.8 C)    TempSrc:  Oral    SpO2: (!) 89% 95%    Weight:   83.6 kg   Height:        Intake/Output Summary (Last 24 hours) at 10/23/2021 0841 Last data filed at 10/23/2021 0300 Gross per 24 hour  Intake 265.46 ml  Output 900 ml  Net -634.54 ml   Last 3 Weights 10/23/2021 10/22/2021 10/21/2021  Weight (lbs) 184 lb 4.9 oz 183 lb 10.3 oz 182 lb 1.6 oz  Weight (kg) 83.6 kg 83.3 kg 82.6 kg      Telemetry    Sinus rhythm - Personally Reviewed  ECG    N/A  Physical Exam   General appearance: alert and no distress Neck: no carotid bruit, no JVD, and thyroid not enlarged, symmetric, no tenderness/mass/nodules Lungs: diminished breath sounds bibasilar Heart: regular rate and rhythm Abdomen: soft, non-tender; bowel sounds normal; no masses,  no organomegaly and obese Extremities: extremities normal, atraumatic, no cyanosis or edema Pulses: 2+ and symmetric Skin: Skin color, texture, turgor normal. No rashes or lesions Neurologic: Grossly  normal PSych: Pleasant   Labs    High Sensitivity Troponin:   Recent Labs  Lab 10/15/21 0629 10/15/21 0829  TROPONINIHS 23* 21*     Chemistry Recent Labs  Lab 10/21/21 0406 10/22/21 0208 10/22/21 1536 10/22/21 1544 10/22/21 1858 10/23/21 0344  NA 132* 133* 137 136  --  133*  K 4.2 4.2 4.5 4.4  --  4.8  CL 97* 95*  --   --   --  96*  CO2 29 28  --   --   --  25  GLUCOSE 127* 171*  --   --   --  109*  BUN 31* 37*  --   --   --  33*  CREATININE 0.62 1.03*  --   --  0.66 0.70  CALCIUM 8.9 9.3  --   --   --  9.0  GFRNONAA >60 57*  --   --  >60 >60  ANIONGAP 6 10  --   --   --  12    Lipids  Recent Labs  Lab 10/19/21 0503  CHOL 177  TRIG 104  HDL 36*  LDLCALC 120*  CHOLHDL 4.9    Hematology Recent Labs  Lab 10/19/21 0503 10/22/21 1536 10/22/21 1544 10/22/21 2137  WBC 9.7  --   --  8.1  RBC 4.50  --   --  4.38  HGB 14.1 14.6 14.3 13.7  HCT 42.8 43.0 42.0 41.2  MCV 95.1  --   --  94.1  MCH 31.3  --   --  31.3  MCHC 32.9  --   --  33.3  RDW 14.7  --   --  14.3  PLT 330  --   --  409*   Thyroid  No results for input(s): TSH, FREET4 in the last 168 hours.   BNP Recent Labs  Lab 10/17/21 1220  BNP 363.8*    DDimer  No results for input(s): DDIMER in the last 168 hours.    Radiology    CARDIAC CATHETERIZATION  Result Date: 10/22/2021   Prox RCA lesion is 65% stenosed.   Prox LAD to Mid LAD lesion is 65% stenosed.   1st Mrg lesion is 90% stenosed.   There is mild to moderate left ventricular systolic dysfunction.   LV end diastolic pressure is normal.   LV end diastolic pressure is normal.   The left ventricular ejection fraction is 35-45% by visual estimate.   Hemodynamic findings consistent with pulmonary hypertension.   There is moderate aortic valve stenosis. CONCLUSION: LAD contains 60 to 70% [place] 010 proximal to mid stenosis. Right coronary contains 60 to 70% proximal and 50% distal stenosis. Circumflex gives origin to 3 obtuse marginals.  The  first obtuse marginal ostial 90% stenosis. Left main is widely patent. Mild global left ventricular hypokinesis with EF 40 to 45%.  Normal LVEDP. Normal pulmonary pressures and capillary wedge. 11 mm peak to peak gradient across the aortic valve.  Findings are consistent with mild to moderate aortic stenosis. RECOMMENDATIONS: Aggressive preventive therapy Guideline directed therapy for systolic heart failure Consider PCI for symptomatic progression of coronary disease. Recommend instituting prophylactic anti-ischemic therapy which will be done in part by guideline directed therapy for systolic heart failure.    Cardiac Studies   Echo  10/17/21  Global hypokinesis with akinesis of the inferolateral wall; overall moderate LV dysfunction; calcified aortic valve with probable moderate AS (mean gradient 12 mmHg; AVA calculated at 0.9 cm2 suggesting severe AS but DI not significant 0.35). 1. Left ventricular ejection fraction, by estimation, is 30 to 35%. The left ventricle has moderately decreased function. The left ventricle demonstrates regional wall motion abnormalities (see scoring diagram/findings for description). Left ventricular diastolic parameters are consistent with Grade I diastolic dysfunction (impaired relaxation). 2. Right ventricular systolic function is mildly reduced. The right ventricular size is normal. There is mildly elevated pulmonary artery systolic pressure. 3. The mitral valve is normal in structure. No evidence of mitral valve regurgitation. No evidence of mitral stenosis. Severe mitral annular calcification. 4. The aortic valve is calcified. Aortic valve regurgitation is not visualized. Moderate aortic valve stenosis. 5. The inferior vena cava is normal in size with greater than 50% respiratory variability, suggesting right atrial pressure of 3 mmHg. 6. Comparison(s): No prior Echocardiogram.  Patient Profile     Amy [name] is a 75 y.o. female with a hx of  HLD, obesity, GERD, pre-diabetes who is being seen 10/18/2021 for the evaluation of CHF and aortic stenosis at the request of Dr. Tawanna Solo.  Assessment & Plan    1  Acute systolic CHF   Pt has diuresed since admit   No Hx of CP - Cath yesterday showed moderate, non-obstructive CAD - medical RX was recommended. There is moderate AS with mean gradient 11 mmHg. On  GDMT, add losartan 25 mg daily.  2  Aortic stenosis    Stenosis appears moderate   Mean gradient is 11.5. Continue to follow with echo - results correlate with cath.  3  RBBB   No old EKG  4  COVID +  treated per hospital medicine.  [facility] will sign off.    Medication Recommendations:  Torsemide 20 mg daily, Toprol XL 12.5 mg daily, aspirin 81 mg daily, atorvastatin 80 mg QHS, losartan 25 mg daily  Other recommendations (labs, testing, etc):  none  Follow up as an outpatient:  Dr. Harrell Gave   For questions or updates, please contact [place] Please consult [email or web address] for contact info under   Pixie Casino, MD, FACC, [place] Director of the [facility]  [facility] Diplomate of the American Board of Clinical Lipidology Attending Cardiologist  Direct Dial: [phone]  Fax: [phone]  Website:  [email or web address]  Pixie Casino, MD  10/23/2021, 8:41 AM

## 2021-10-23 NOTE — Discharge Summary (Signed)
Physician Discharge Summary  QUINNLYN HEARNS BWI:[id number] DOB: Jun 10, 1946 DOA: 10/14/2021  PCP: Shirline Frees, MD  Admit date: 10/14/2021 Discharge date: 10/23/2021  Admitted From: Home Disposition: Home   Recommendations for Outpatient Follow-up:  Follow up with PCP in 1-2 weeks for prediabetes, HTN, HLD. 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  Follow up with cardiology for continued HFrEF Tx.  Home Health: PT Equipment/Devices: None new Discharge Condition: Stable CODE STATUS: Full Diet recommendation: Heart healthy, carb-limited  Brief/Interim Summary: Patient is a 75 year old female with history of hyperlipidemia who presented with shortness of breath that started about 3 weeks ago.  She was also complaining of cough.  Cough did not improve with outpatient medication.  She was seen in the urgent care and sent to the emergency department.  COVID screen test came out to be positive.Chest imaging showed cardiomegaly, pulmonary vascular congestion.  Labs showed elevated BNP.  Patient was admitted for the management of possible new onset acute congestive heart failure.  She was started on IV Lasix.  Echocardiogram showed EF of 30 to 63%, grade 1 diastolic dysfunction, moderate to severe aortic stenosis.  These are all new findings. .  Cardiology consulted , plan for L/R heart cath.  Cardiology recommended to transfer her to [facility].  Patient transferred to [facility] 12/6 for cardiac cath. This showed nonobstructive coronary disease, moderate aortic stenosis. Medical management was continued and will be prescribed at discharge. The patient is stable for discharge and will follow up with cardiology after isolation period.   Discharge Diagnoses:  Principal Problem:   Acute CHF (congestive heart failure) (HCC) Active Problems:   COVID   Congestive heart failure (HCC)  Moderate AS: Per cardiology  Acute systolic CHF: Presented with cough  ,elevated BNP.  Chest x-ray suggestive of CHF.  Started on Lasix 40 mg twice daily after admission. She had bibasilar crackles but no peripheral edema. Echocardiogram showed EF of 30 to 84%, grade 1 diastolic dysfunction, moderate  aortic stenosis.  These are all new findings. - Nonobstructive LHC, medical management recommended.  - Per cardiology, continue GDMT with torsemide 20 mg daily, Toprol XL 12.5 mg daily, aspirin 81 mg daily, atorvastatin 80 mg QHS, losartan 25 mg daily - Follow up with Dr. Harrell Gave, cardiology, as outpatient.   HLD: Augment statin to atorvastatin, LDL was 120.   Covid-19 infection: Symptoms about 3 weeks prior, no pneumonia on CXR, no hypoxemia, no indication for treatment. +11/28, isolate x10 days.  Left leg cellulitis: Completed course of ancef > keflex.  Prediabetes: HbA1c 6.3%.  - PCP follow up.  Obesity: Estimated body mass index is 38.52 kg/m as calculated from the following:   Height as of this encounter: 4\' 10"  (1.473 m).   Weight as of this encounter: 83.6 kg.  Discharge Instructions Discharge Instructions     Diet - low sodium heart healthy   Complete by: As directed    Discharge instructions   Complete by: As directed    You were admitted for heart failure which has been treated with new medications. These have been prescribed for you at discharge. Atorvastatin was started to increase the cholesterol lowering treatment of lovastatin. You should STOP lovastatin and START atorvastatin. New medications otherwise include losartan, metoprolol, and torsemide. Each taken once daily. If you develop shortness of breath or pain in the chest or weight gain, seek medical attention right away.   Because you tested positive for covid-19, you should remain in isolation for  10 total days, though no further treatment or testing is necessary for this condition at this time.   A follow up appointment has been made for you with cardiology, and you should see your  PCP in the next 1-2 weeks for repeat clinical evaluation and labs.      Allergies as of 10/23/2021       Reactions   Morphine And Related Nausea And Vomiting   Timolol    Other reaction(s): SOB        Medication List     STOP taking these medications    gabapentin 300 MG capsule Commonly known as: NEURONTIN   lovastatin 40 MG tablet Commonly known as: MEVACOR       TAKE these medications    acetaminophen 500 MG tablet Commonly known as: TYLENOL Take 500 mg by mouth every 6 (six) hours as needed for mild pain or headache.   ascorbic acid 500 MG tablet Commonly known as: VITAMIN C Take by mouth.   aspirin 81 MG EC tablet Take by mouth.   aspirin-sod bicarb-citric acid 325 MG Tbef tablet Commonly known as: ALKA-SELTZER Take 325 mg by mouth every 6 (six) hours as needed (heartburn).   atorvastatin 80 MG tablet Commonly known as: LIPITOR Take 1 tablet (80 mg total) by mouth daily. Start taking on: October 24, 2021   B-complex with vitamin C tablet Take 1 tablet by mouth daily.   cholecalciferol 25 MCG (1000 UNIT) tablet Commonly known as: VITAMIN D3 1 tablet   Cinnamon 500 MG capsule   Co Q-10 200 MG Caps 1 capsule with a meal   DRY EYE RELIEF OP Apply 1 drop to eye 4 (four) times daily as needed (dryness).   Fish Oil 1000 MG Caps 1 capsule   Flax Seed Oil 1000 MG Caps   Ginger 500 MG Caps   GOLD BOND INTENSIVE HEALING EX Apply 1 application topically daily as needed (eczema).   losartan 25 MG tablet Commonly known as: COZAAR Take 1 tablet (25 mg total) by mouth daily.   meclizine 25 MG tablet Commonly known as: ANTIVERT Take 25 mg by mouth daily as needed for dizziness.   metoprolol succinate 25 MG 24 hr tablet Commonly known as: TOPROL-XL Take 0.5 tablets (12.5 mg total) by mouth daily. Start taking on: October 24, 2021   Multi-Vitamin tablet Take 1 tablet by mouth daily.   omeprazole 40 MG capsule Commonly known as:  PRILOSEC Take 40 mg by mouth daily.   PEPTO-BISMOL PO Take 1 tablet by mouth daily as needed (heartburn).   torsemide 20 MG tablet Commonly known as: DEMADEX Take 1 tablet (20 mg total) by mouth daily. Start taking on: October 24, 2021   VITAMIN B 12 PO Take 1 tablet by mouth daily.   vitamin E 1000 UNIT capsule Take by mouth.        Follow-up Information     Buford Dresser, MD Follow up.   Specialty: Cardiology Why: Hospital follow-up scheduled fro 11/07/2021 at 9:20am. Please arrive 15 minute early for check-in. If this date/time does not work for you, please call our office to reschedule. Contact information: [place] [address] [phone]         Shirline Frees, MD Follow up.   Specialty: Family Medicine Contact information: [address] [place] [address] [phone]                Allergies  Allergen Reactions   Morphine And Related  Nausea And Vomiting   Timolol     Other reaction(s): SOB    Consultations: Cardiology  Procedures/Studies: DG Chest 2 View  Result Date: 10/14/2021 CLINICAL DATA:  Shortness of breath. EXAM: CHEST - 2 VIEW COMPARISON:  Chest x-ray 10/21/2009 FINDINGS: Heart is enlarged, unchanged. There is central pulmonary vascular congestion. Minimal strandy opacities in the lung bases likely represent atelectasis. There is no focal lung consolidation, pleural effusion or pneumothorax. There are surgical changes in the right shoulder. No acute fractures. IMPRESSION: 1. Cardiomegaly with central pulmonary vascular congestion. Electronically Signed   By: Ronney Asters M.D.   On: 10/14/2021 22:36   CT Angio Chest Pulmonary Embolism (PE) W or WO Contrast  Result Date: 10/20/2021 CLINICAL DATA:  A 75 year old female presents for evaluation of chest pain or shortness of breath. EXAM: CT ANGIOGRAPHY CHEST WITH CONTRAST TECHNIQUE: Multidetector CT imaging of the chest was performed  using the standard protocol during bolus administration of intravenous contrast. Multiplanar CT image reconstructions and MIPs were obtained to evaluate the vascular anatomy. CONTRAST:  7mL OMNIPAQUE IOHEXOL 350 MG/ML SOLN COMPARISON:  Chest x-ray of October 14, 2021. FINDINGS: Cardiovascular: Calcified atherosclerotic plaque of the thoracic aorta. Tortuous great vessels in the chest. Limited assessment based on phase of contrast enhancement. Calcified coronary artery disease. Heart size moderately enlarged. No signs of suspecting antral pericardial effusion. Main pulmonary artery at 400 Hounsfield unit density. No signs of central, lobar or segmental level embolus with limited assessment particularly at the lung bases due to respiratory motion also general limited assessment of more peripheral vessels due to respiratory motion on the current study. Mediastinum/Nodes: Mildly patulous and diffusely mildly thickened esophagus. No adenopathy in the chest. Lungs/Pleura: Parenchymal assessment mildly limited by respiratory motion. No gross consolidative process. No signs of effusion or pneumothorax. Airways are patent. Upper Abdomen: Incidental imaging of upper abdominal contents without acute process. Musculoskeletal: No acute bone finding. No destructive bone process. Spinal degenerative changes. Review of the MIP images confirms the above findings. IMPRESSION: No signs of central, lobar or segmental level pulmonary embolus with limited assessment of more peripheral vessels due to respiratory motion on the current study. Cardiomegaly with coronary artery disease. Mildly patulous and diffusely mildly thickened esophagus. Correlate with any symptoms of esophageal dysmotility and or esophagitis. Aortic atherosclerosis. Aortic Atherosclerosis (ICD10-I70.0). Electronically Signed   By: Zetta Bills M.D.   On: 10/20/2021 18:00   CARDIAC CATHETERIZATION  Result Date: 10/22/2021   Prox RCA lesion is 65% stenosed.    Prox LAD to Mid LAD lesion is 65% stenosed.   1st Mrg lesion is 90% stenosed.   There is mild to moderate left ventricular systolic dysfunction.   LV end diastolic pressure is normal.   LV end diastolic pressure is normal.   The left ventricular ejection fraction is 35-45% by visual estimate.   Hemodynamic findings consistent with pulmonary hypertension.   There is moderate aortic valve stenosis. CONCLUSION: LAD contains 60 to 70% [place] 010 proximal to mid stenosis. Right coronary contains 60 to 70% proximal and 50% distal stenosis. Circumflex gives origin to 3 obtuse marginals.  The first obtuse marginal ostial 90% stenosis. Left main is widely patent. Mild global left ventricular hypokinesis with EF 40 to 45%.  Normal LVEDP. Normal pulmonary pressures and capillary wedge. 11 mm peak to peak gradient across the aortic valve.  Findings are consistent with mild to moderate aortic stenosis. RECOMMENDATIONS: Aggressive preventive therapy Guideline directed therapy for systolic heart failure Consider PCI for symptomatic progression  of coronary disease. Recommend instituting prophylactic anti-ischemic therapy which will be done in part by guideline directed therapy for systolic heart failure.   ECHOCARDIOGRAM LIMITED  Result Date: 10/17/2021    ECHOCARDIOGRAM LIMITED REPORT   Patient Name:   TESSY PAWELSKI Date of Exam: 10/17/2021 Medical Rec #:  [id number]      Height:       58.0 in Accession #:    [id number]     Weight:       189.6 lb Date of Birth:  [birth year]      BSA:          1.780 m Patient Age:    30 years       BP:           143/85 mmHg Patient Gender: F              HR:           90 bpm. Exam Location:  Inpatient Procedure: Limited Echo, Limited Color Doppler and Cardiac Doppler Indications:    acute systolic chf  History:        Patient has no prior history of Echocardiogram examinations.                 Covid, Signs/Symptoms:Dyspnea; Risk Factors:Dyslipidemia.  Sonographer:    Baker  Referring Phys: [id number] [place]  1. Global hypokinesis with akinesis of the inferolateral wall; overall moderate LV dysfunction; calcified aortic valve with probable moderate AS (mean gradient 12 mmHg; AVA calculated at 0.9 cm2 suggesting severe AS but DI not significant 0.35).  2. Left ventricular ejection fraction, by estimation, is 30 to 35%. The left ventricle has moderately decreased function. The left ventricle demonstrates regional wall motion abnormalities (see scoring diagram/findings for description). Left ventricular  diastolic parameters are consistent with Grade I diastolic dysfunction (impaired relaxation).  3. Right ventricular systolic function is mildly reduced. The right ventricular size is normal. There is mildly elevated pulmonary artery systolic pressure.  4. The mitral valve is normal in structure. No evidence of mitral valve regurgitation. No evidence of mitral stenosis. Severe mitral annular calcification.  5. The aortic valve is calcified. Aortic valve regurgitation is not visualized. Moderate aortic valve stenosis.  6. The inferior vena cava is normal in size with greater than 50% respiratory variability, suggesting right atrial pressure of 3 mmHg. Comparison(s): No prior Echocardiogram. FINDINGS  Left Ventricle: Left ventricular ejection fraction, by estimation, is 30 to 35%. The left ventricle has moderately decreased function. The left ventricle demonstrates regional wall motion abnormalities. The left ventricular internal cavity size was normal in size. There is no left ventricular hypertrophy. Left ventricular diastolic parameters are consistent with Grade I diastolic dysfunction (impaired relaxation). Right Ventricle: The right ventricular size is normal. Right ventricular systolic function is mildly reduced. There is mildly elevated pulmonary artery systolic pressure. The tricuspid regurgitant velocity is 2.88 m/s, and with an assumed right atrial pressure of 3  mmHg, the estimated right ventricular systolic pressure is 89.3 mmHg. Left Atrium: Left atrial size was normal in size. Right Atrium: Right atrial size was normal in size. Pericardium: There is no evidence of pericardial effusion. Mitral Valve: The mitral valve is normal in structure. Severe mitral annular calcification. No evidence of mitral valve stenosis. Tricuspid Valve: The tricuspid valve is normal in structure. Tricuspid valve regurgitation is mild . No evidence of tricuspid stenosis. Aortic Valve: The aortic valve is calcified. Aortic valve regurgitation is not visualized. Moderate aortic  stenosis is present. Aortic valve mean gradient measures 11.5 mmHg. Aortic valve peak gradient measures 20.7 mmHg. Aortic valve area, by VTI measures 0.88 cm. Pulmonic Valve: The pulmonic valve was normal in structure. Pulmonic valve regurgitation is not visualized. No evidence of pulmonic stenosis. Aorta: The aortic root is normal in size and structure. Venous: The inferior vena cava is normal in size with greater than 50% respiratory variability, suggesting right atrial pressure of 3 mmHg. IAS/Shunts: No atrial level shunt detected by color flow Doppler. Additional Comments: Global hypokinesis with akinesis of the inferolateral wall; overall moderate LV dysfunction; calcified aortic valve with probable moderate AS (mean gradient 12 mmHg; AVA calculated at 0.9 cm2 suggesting severe AS but DI not significant 0.35). LEFT VENTRICLE PLAX 2D LVIDd:         4.60 cm     Diastology LVIDs:         3.40 cm     LV e' medial:    4.46 cm/s LV PW:         1.00 cm     LV E/e' medial:  13.4 LV IVS:        1.00 cm     LV e' lateral:   5.66 cm/s LVOT diam:     1.80 cm     LV E/e' lateral: 10.5 LV SV:         34 LV SV Index:   19 LVOT Area:     2.54 cm  LV Volumes (MOD) LV vol d, MOD A4C: 76.7 ml LV vol s, MOD A4C: 52.0 ml LV SV MOD A4C:     76.7 ml IVC IVC diam: 1.20 cm LEFT ATRIUM         Index LA diam:    3.60 cm 2.02 cm/m  AORTIC  VALVE AV Area (Vmax):    0.84 cm AV Area (Vmean):   0.81 cm AV Area (VTI):     0.88 cm AV Vmax:           227.50 cm/s AV Vmean:          156.500 cm/s AV VTI:            0.387 m AV Peak Grad:      20.7 mmHg AV Mean Grad:      11.5 mmHg LVOT Vmax:         75.00 cm/s LVOT Vmean:        49.900 cm/s LVOT VTI:          0.134 m LVOT/AV VTI ratio: 0.35 MITRAL VALVE               TRICUSPID VALVE MV Area (PHT): 4.57 cm    TR Peak grad:   33.2 mmHg MV Decel Time: 166 msec    TR Vmax:        288.00 cm/s MV E velocity: 59.60 cm/s MV A velocity: 79.30 cm/s  SHUNTS MV E/A ratio:  0.75        Systemic VTI:  0.13 m                            Systemic Diam: 1.80 cm Kirk Ruths MD Electronically signed by Kirk Ruths MD Signature Date/Time: 10/17/2021/4:09:16 PM    Final      Subjective: Cath yesterday showed moderate to severe stenoses of the LAD, RCA and OM1. LVEF 35-45%, moderate AS, mean gradient 11 mmHg, normal right and left heart filling pressures. Medical therapy  was recommended. Overall she feels well and wants to go home.  Discharge Exam: Vitals:   10/23/21 0426 10/23/21 0822  BP: (!) 142/66 (!) 152/69  Pulse: 82 93  Resp: 16   Temp: 98.2 F (36.8 C)   SpO2: 95%    General: Pt is alert, awake, not in acute distress Cardiovascular: RRR, S1/S2 +, no rubs, no gallops Respiratory: Clear and diminished bilaterally, no wheezing, no rhonchi Abdominal: Soft, NT, ND, bowel sounds + Extremities: No edema, no cyanosis  Labs: BNP (last 3 results) Recent Labs    10/15/21 0629 10/17/21 1220  BNP 991.7* 761.6*   Basic Metabolic Panel: Recent Labs  Lab 10/19/21 0503 10/20/21 0408 10/21/21 0406 10/22/21 0208 10/22/21 1536 10/22/21 1544 10/22/21 1858 10/23/21 0344  NA 131* 132* 132* 133* 137 136  --  133*  K 4.3 4.1 4.2 4.2 4.5 4.4  --  4.8  CL 99 98 97* 95*  --   --   --  96*  CO2 29 28 29 28   --   --   --  25  GLUCOSE 103* 116* 127* 171*  --   --   --  109*  BUN 28* 26* 31* 37*  --   --    --  33*  CREATININE 0.63 0.69 0.62 1.03*  --   --  0.66 0.70  CALCIUM 8.2* 8.3* 8.9 9.3  --   --   --  9.0   Liver Function Tests: No results for input(s): AST, ALT, ALKPHOS, BILITOT, PROT, ALBUMIN in the last 168 hours. No results for input(s): LIPASE, AMYLASE in the last 168 hours. No results for input(s): AMMONIA in the last 168 hours. CBC: Recent Labs  Lab 10/19/21 0503 10/22/21 1536 10/22/21 1544 10/22/21 2137  WBC 9.7  --   --  8.1  HGB 14.1 14.6 14.3 13.7  HCT 42.8 43.0 42.0 41.2  MCV 95.1  --   --  94.1  PLT 330  --   --  409*   Cardiac Enzymes: No results for input(s): CKTOTAL, CKMB, CKMBINDEX, TROPONINI in the last 168 hours. BNP: Invalid input(s): POCBNP CBG: No results for input(s): GLUCAP in the last 168 hours. D-Dimer No results for input(s): DDIMER in the last 72 hours. Hgb A1c No results for input(s): HGBA1C in the last 72 hours. Lipid Profile No results for input(s): CHOL, HDL, LDLCALC, TRIG, CHOLHDL, LDLDIRECT in the last 72 hours. Thyroid function studies No results for input(s): TSH, T4TOTAL, T3FREE, THYROIDAB in the last 72 hours.  Invalid input(s): FREET3 Anemia work up No results for input(s): VITAMINB12, FOLATE, FERRITIN, TIBC, IRON, RETICCTPCT in the last 72 hours. Urinalysis    Component Value Date/Time   COLORURINE YELLOW 10/21/2009 1813   APPEARANCEUR CLEAR 10/21/2009 1813   LABSPEC 1.016 10/21/2009 1813   PHURINE 7.5 10/21/2009 1813   GLUCOSEU NEGATIVE 10/21/2009 1813   HGBUR NEGATIVE 10/21/2009 1813   BILIRUBINUR NEGATIVE 10/21/2009 1813   KETONESUR NEGATIVE 10/21/2009 1813   PROTEINUR NEGATIVE 10/21/2009 1813   UROBILINOGEN 0.2 10/21/2009 1813   NITRITE NEGATIVE 10/21/2009 1813   LEUKOCYTESUR  10/21/2009 1813    NEGATIVE MICROSCOPIC NOT DONE ON URINES WITH NEGATIVE PROTEIN, BLOOD, LEUKOCYTES, NITRITE, OR GLUCOSE <1000 mg/dL.    Microbiology Recent Results (from the past 240 hour(s))  Resp Panel by RT-PCR (Flu A&B, Covid)  Nasopharyngeal Swab     Status: Abnormal   Collection Time: 10/14/21 11:53 PM   Specimen: Nasopharyngeal Swab; Nasopharyngeal(NP) swabs in vial transport medium  Result Value Ref Range Status   SARS Coronavirus 2 by RT PCR POSITIVE (A) NEGATIVE Final    Comment: CRITICAL RESULT CALLED TO, READ BACK BY AND VERIFIED WITH: TJ RIVERS RN 10/15/21 @ 0107 VS (NOTE) SARS-CoV-2 target nucleic acids are DETECTED.  The SARS-CoV-2 RNA is generally detectable in upper respiratory specimens during the acute phase of infection. Positive results are indicative of the presence of the identified virus, but do not rule out bacterial infection or co-infection with other pathogens not detected by the test. Clinical correlation with patient history and other diagnostic information is necessary to determine patient infection status. The expected result is Negative.  Fact Sheet for Patients: EntrepreneurPulse.com.au  Fact Sheet for Healthcare Providers: IncredibleEmployment.be  This test is not yet approved or cleared by the [place] FDA and  has been authorized for detection and/or diagnosis of SARS-CoV-2 by FDA under an Emergency Use Authorization (EUA).  This EUA will remain in effect (meaning this test  can be used) for the duration of  the COVID-19 declaration under Section 564(b)(1) of the Act, 21 U.S.C. section 360bbb-3(b)(1), unless the authorization is terminated or revoked sooner.     Influenza A by PCR NEGATIVE NEGATIVE Final   Influenza B by PCR NEGATIVE NEGATIVE Final    Comment: (NOTE) The Xpert Xpress SARS-CoV-2/FLU/RSV plus assay is intended as an aid in the diagnosis of influenza from Nasopharyngeal swab specimens and should not be used as a sole basis for treatment. Nasal washings and aspirates are unacceptable for Xpert Xpress SARS-CoV-2/FLU/RSV testing.  Fact Sheet for Patients: EntrepreneurPulse.com.au  Fact Sheet for  Healthcare Providers: IncredibleEmployment.be  This test is not yet approved or cleared by the [place] FDA and has been authorized for detection and/or diagnosis of SARS-CoV-2 by FDA under an Emergency Use Authorization (EUA). This EUA will remain in effect (meaning this test can be used) for the duration of the COVID-19 declaration under Section 564(b)(1) of the Act, 21 U.S.C. section 360bbb-3(b)(1), unless the authorization is terminated or revoked.  Performed at [facility], [place] [address] [place], [place] [address]   Culture, blood (routine x 2)     Status: None   Collection Time: 10/15/21  9:25 PM   Specimen: BLOOD  Result Value Ref Range Status   Specimen Description   Final    BLOOD LEFT ANTECUBITAL Performed at [facility] [address], [place], [place] [address]    Special Requests   Final    BOTTLES DRAWN AEROBIC ONLY Blood Culture adequate volume Performed at [facility] [address] [place], [place] [address]    Culture   Final    NO GROWTH 5 DAYS Performed at [facility], [place] [address], [place], [place] [address]    Report Status 10/21/2021 FINAL  Final  Culture, blood (routine x 2)     Status: None   Collection Time: 10/15/21  9:25 PM   Specimen: BLOOD RIGHT HAND  Result Value Ref Range Status   Specimen Description   Final    BLOOD RIGHT HAND Performed at [facility] [address] [place], [place] [address]    Special Requests   Final    BOTTLES DRAWN AEROBIC ONLY Blood Culture adequate volume Performed at [facility] [address], [place], [place] [address]    Culture   Final    NO GROWTH 5 DAYS Performed at [facility], [place],  [place] [address]    Report Status 10/21/2021 FINAL  Final  Surgical pcr screen     Status: None   Collection Time: 10/21/21  2:00 AM   Specimen: Nasal  Mucosa; Nasal Swab  Result Value Ref Range Status   MRSA, PCR NEGATIVE NEGATIVE Final   Staphylococcus aureus NEGATIVE NEGATIVE Final    Comment: (NOTE) The Xpert SA Assay (FDA approved for NASAL specimens in patients 72 years of age and older), is one component of a comprehensive surveillance program. It is not intended to diagnose infection nor to guide or monitor treatment. Performed at [facility], [place] [address], [place], [place] [address]     Time coordinating discharge: Approximately 40 minutes  Patrecia Pour, MD  [facility] 10/23/2021, 10:04 AM

## 2021-10-23 NOTE — Progress Notes (Signed)
Physical Therapy Treatment Patient Details Name: Amy [name] MRN: [id number] DOB: 03/20/1946 Today's Date: 10/23/2021   History of Present Illness 75 yo female admitted 10/14/21 with COVID, CHF. Transferred to [facility] 12/5. S/p heart cath 12/6 showed moderate to severe stenoses of the LAD, RCA and OM1. LVEF 35-45%, moderate AS, mean gradient 11 mmHg, normal right and left heart filling pressures. Hx of obesity, CHF, aortic stenosis, RBBB    PT Comments    Pt is making good progress with mobility safety and independence, displaying steady gait with no LOB when using a RW this date. Pt able to transfer and ambulate short household distances at a min guard assist level using a RW. Educated pt to work with PT at home to progress back to using her cane and to have daughter assist her with stairs at home. Performed bed level exercises to address lower extremity weakness. Will continue to follow acutely. Current recommendations remain appropriate.    Recommendations for follow up therapy are one component of a multi-disciplinary discharge planning process, led by the attending physician.  Recommendations may be updated based on patient status, additional functional criteria and insurance authorization.  Follow Up Recommendations  Home health PT     Assistance Recommended at Discharge Intermittent Supervision/Assistance  Equipment Recommendations  None recommended by PT    Recommendations for Other Services       Precautions / Restrictions Precautions Precautions: Fall Precaution Comments: COVID+ Restrictions Weight Bearing Restrictions: No     Mobility  Bed Mobility Overal bed mobility: Needs Assistance Bed Mobility: Supine to Sit;Sit to Supine     Supine to sit: Min assist;HOB elevated Sit to supine: Min guard   General bed mobility comments: Pt requesting assist to ascend trunk, minA, from elevated HOB. Min guard for safety with return to supine.    Transfers Overall transfer  level: Needs assistance Equipment used: Rolling walker (2 wheels) Transfers: Sit to/from Stand Sit to Stand: Min guard           General transfer comment: Extra time but min guard for safety with no LOB with transfer to stand 3x from EOB.    Ambulation/Gait Ambulation/Gait assistance: Min guard Gait Distance (Feet): 68 Feet (x2 bouts of ~68 ft > ~48 ft) Assistive device: Rolling walker (2 wheels) Gait Pattern/deviations: Step-through pattern;Decreased stride length;Decreased step length - right;Decreased step length - left Gait velocity: reduced Gait velocity interpretation: <1.31 ft/sec, indicative of household ambulator   General Gait Details: Pt with slow but steady gait with short, shuffling steps. No LOB, min guard for safety. Educated pt to use RW at home until stability improves. Educated pt to have daughter assist her on stairs initially   Chief Strategy Officer    Modified Rankin (Stroke Patients Only)       Balance Overall balance assessment: Needs assistance;History of Falls Sitting-balance support: No upper extremity supported;Feet supported Sitting balance-Leahy Scale: Fair Sitting balance - Comments: Static sitting EOB with supervision for safety.   Standing balance support: Reliant on assistive device for balance Standing balance-Leahy Scale: Poor Standing balance comment: Reliant on RW                            Cognition Arousal/Alertness: Awake/alert Behavior During Therapy: WFL for tasks assessed/performed Overall Cognitive Status: Within Functional Limits for tasks assessed  Exercises General Exercises - Lower Extremity Heel Slides: Strengthening;Both;10 reps;Supine Hip ABduction/ADduction: Strengthening;Both;10 reps;Supine Straight Leg Raises: Strengthening;Both;10 reps;Supine    General Comments General comments (skin integrity, edema, etc.): SpO2  88% on RA supine upon arrival, >/= 91% on RA throughout after initiating movement      Pertinent Vitals/Pain Pain Assessment: Faces Faces Pain Scale: No hurt Pain Intervention(s): Monitored during session    Home Living                          Prior Function            PT Goals (current goals can now be found in the care plan section) Acute Rehab PT Goals Patient Stated Goal: to go home PT Goal Formulation: With patient Time For Goal Achievement: 11/03/21 Potential to Achieve Goals: Good Progress towards PT goals: Progressing toward goals    Frequency    Min 3X/week      PT Plan Current plan remains appropriate    Co-evaluation              AM-PAC PT "6 Clicks" Mobility   Outcome Measure  Help needed turning from your back to your side while in a flat bed without using bedrails?: A Little Help needed moving from lying on your back to sitting on the side of a flat bed without using bedrails?: A Little Help needed moving to and from a bed to a chair (including a wheelchair)?: A Little Help needed standing up from a chair using your arms (e.g., wheelchair or bedside chair)?: A Little Help needed to walk in hospital room?: A Little Help needed climbing 3-5 steps with a railing? : A Little 6 Click Score: 18    End of Session   Activity Tolerance: Patient tolerated treatment well Patient left: in bed;with call bell/phone within reach;with bed alarm set Nurse Communication: Mobility status PT Visit Diagnosis: Muscle weakness (generalized) (M62.81);Difficulty in walking, not elsewhere classified (R26.2);Unsteadiness on feet (R26.81);Other abnormalities of gait and mobility (R26.89)     Time: 1771-1657 PT Time Calculation (min) (ACUTE ONLY): 33 min  Charges:  $Gait Training: 8-22 mins $Therapeutic Exercise: 8-22 mins                     Moishe Spice, PT, DPT Acute Rehabilitation Services  Pager: [phone] Office: [place] 10/23/2021, 3:01 PM

## 2021-10-31 DIAGNOSIS — I5041 Acute combined systolic (congestive) and diastolic (congestive) heart failure: Secondary | ICD-10-CM | POA: Diagnosis not present

## 2021-10-31 DIAGNOSIS — I350 Nonrheumatic aortic (valve) stenosis: Secondary | ICD-10-CM | POA: Diagnosis not present

## 2021-10-31 DIAGNOSIS — E871 Hypo-osmolality and hyponatremia: Secondary | ICD-10-CM | POA: Diagnosis not present

## 2021-10-31 DIAGNOSIS — I2510 Atherosclerotic heart disease of native coronary artery without angina pectoris: Secondary | ICD-10-CM | POA: Diagnosis not present

## 2021-10-31 DIAGNOSIS — E7800 Pure hypercholesterolemia, unspecified: Secondary | ICD-10-CM | POA: Diagnosis not present

## 2021-10-31 DIAGNOSIS — R2681 Unsteadiness on feet: Secondary | ICD-10-CM | POA: Diagnosis not present

## 2021-11-07 ENCOUNTER — Ambulatory Visit (HOSPITAL_BASED_OUTPATIENT_CLINIC_OR_DEPARTMENT_OTHER): Payer: Medicare HMO | Admitting: Cardiology

## 2021-12-11 ENCOUNTER — Other Ambulatory Visit: Payer: Self-pay

## 2021-12-11 ENCOUNTER — Ambulatory Visit (HOSPITAL_BASED_OUTPATIENT_CLINIC_OR_DEPARTMENT_OTHER): Payer: Medicare HMO | Admitting: Cardiology

## 2021-12-11 VITALS — BP 134/64 | HR 75 | Ht <= 58 in | Wt 184.6 lb

## 2021-12-11 DIAGNOSIS — I350 Nonrheumatic aortic (valve) stenosis: Secondary | ICD-10-CM | POA: Diagnosis not present

## 2021-12-11 DIAGNOSIS — I5042 Chronic combined systolic (congestive) and diastolic (congestive) heart failure: Secondary | ICD-10-CM | POA: Diagnosis not present

## 2021-12-11 DIAGNOSIS — E7800 Pure hypercholesterolemia, unspecified: Secondary | ICD-10-CM | POA: Diagnosis not present

## 2021-12-11 DIAGNOSIS — I428 Other cardiomyopathies: Secondary | ICD-10-CM | POA: Insufficient documentation

## 2021-12-11 DIAGNOSIS — I10 Essential (primary) hypertension: Secondary | ICD-10-CM

## 2021-12-11 DIAGNOSIS — I2510 Atherosclerotic heart disease of native coronary artery without angina pectoris: Secondary | ICD-10-CM | POA: Insufficient documentation

## 2021-12-11 DIAGNOSIS — Z8616 Personal history of COVID-19: Secondary | ICD-10-CM | POA: Insufficient documentation

## 2021-12-11 MED ORDER — METOPROLOL SUCCINATE ER 25 MG PO TB24: 25.0000 mg | ORAL_TABLET | Freq: Every day | ORAL | 3 refills | Status: DC

## 2021-12-11 NOTE — Patient Instructions (Signed)
Medication Instructions:  1) Increase Metoprolol to 25 mg (1 tablet) daily  *If you need a refill on your cardiac medications before your next appointment, please call your pharmacy*   Lab Work: None ordered today   Testing/Procedures: Your physician has requested that you have an echocardiogram in March, 2023. Echocardiography is a painless test that uses sound waves to create images of your heart. It provides your doctor with information about the size and shape of your heart and how well your hearts chambers and valves are working. This procedure takes approximately one hour. There are no restrictions for this procedure. [place], you and your health needs are our priority.  As part of our continuing mission to provide you with exceptional heart care, we have created designated Provider Care Teams.  These Care Teams include your primary Cardiologist (physician) and Advanced Practice Providers (APPs -  Physician Assistants and Nurse Practitioners) who all work together to provide you with the care you need, when you need it.  We recommend signing up for the patient portal called "MyChart".  Sign up information is provided on this After Visit Summary.  MyChart is used to connect with patients for Virtual Visits (Telemedicine).  Patients are able to view lab/test results, encounter notes, upcoming appointments, etc.  Non-urgent messages can be sent to your provider as well.   To learn more about what you can do with MyChart, go to [email or web address].    Your next appointment:   3 month(s)  The format for your next appointment:   In Person  Provider:   Buford Dresser, MD

## 2021-12-11 NOTE — Progress Notes (Signed)
Cardiology Office Note:    Date:  12/11/2021   ID:  Amy [name], DOB 03-11-1946, MRN [id number]  PCP:  Shirline Frees, MD  Cardiologist:  Buford Dresser, MD  Referring MD: Shirline Frees, MD   CC: post hospital follow up  History of Present Illness:    Amy [name] is a 76 y.o. female with a hx of hyperlipidemia, GERD, pre-diabetes, and obesity who is seen for post-hospital follow-up.   Notes from 10/14/2021 hospitalization reviewed: Ms. Amy [name] was admitted 10/14/2021 for management of possible new onset acute congestive heart failure. She complained of 3 weeks of shortness of breath and cough. She tested COVID positive as well. Echo showed LVEF 62-13%, grade 1 diastolic dysfunction, and moderate to severe aortic stenosis. She was transferred to [facility] on 10/22/2021 and she underwent a right and left heart cath without PCI. Nonobstructive coronary disease and moderate aortic stenosis was found.  Today: Overall, she is feeling fine. Her breathing is stable, and she notes having some sputum production. While lying flat at night she no longer suffers from chest pain. Lately she is not having issues with LE edema.  At home she does check her blood pressure due to difficulty with taking her readings.   Occasionally she uses her walker when waking up for balance. Generally she ambulates well and has noticed she is able to rush at times in her home without become short of breath. When she becomes fatigued she will rest. Her energy is improving, and she reports being able to continue through 40 minutes of activity.  She confirms adequate urine production while on torsemide.   She self-discontinued omeprazole due to new side effects of feeling ill and concern for causing shortness of breath and fluid retention. Also she is no longer taking Alka-seltzer.  She denies any palpitations, lightheadedness, headaches, syncope, orthopnea, or PND.  Past Medical History:  Diagnosis Date    GERD (gastroesophageal reflux disease)    Hyperlipidemia    Obesity    Pre-diabetes     Past Surgical History:  Procedure Laterality Date   CARPAL TUNNEL RELEASE Bilateral    FEMUR FRACTURE SURGERY     REPLACEMENT TOTAL KNEE BILATERAL     RIGHT/LEFT HEART CATH AND CORONARY ANGIOGRAPHY N/A 10/22/2021   Procedure: RIGHT/LEFT HEART CATH AND CORONARY ANGIOGRAPHY;  Surgeon: Belva Crome, MD;  Location: [place] [facility];  Service: Cardiovascular;  Laterality: N/A;   SHOULDER SURGERY Right    rotator cuff    Current Medications: Current Outpatient Medications on File Prior to Visit  Medication Sig   acetaminophen (TYLENOL) 500 MG tablet Take 500 mg by mouth every 6 (six) hours as needed for mild pain or headache.   ascorbic acid (VITAMIN C) 500 MG tablet Take by mouth.   aspirin 81 MG EC tablet Take 81 mg by mouth daily.   atorvastatin (LIPITOR) 80 MG tablet Take 1 tablet (80 mg total) by mouth daily.   B Complex-C (B-COMPLEX WITH VITAMIN C) tablet Take 1 tablet by mouth daily.   Carboxymethylcellulose Sodium (DRY EYE RELIEF OP) Apply 1 drop to eye 4 (four) times daily as needed (dryness).   cholecalciferol (VITAMIN D3) 25 MCG (1000 UNIT) tablet 1 tablet   Cinnamon 500 MG capsule    Coenzyme Q10 (CO Q-10) 200 MG CAPS 1 capsule with a meal   Cyanocobalamin (VITAMIN B 12 PO) Take 1 tablet by mouth daily.   Flaxseed, Linseed, (FLAX SEED OIL) 1000 MG CAPS    Ginger  500 MG CAPS    losartan (COZAAR) 25 MG tablet Take 1 tablet (25 mg total) by mouth daily.   meclizine (ANTIVERT) 25 MG tablet Take 25 mg by mouth daily as needed for dizziness.   Multiple Vitamin (MULTI-VITAMIN) tablet Take 1 tablet by mouth daily.   Omega-3 Fatty Acids (FISH OIL) 1000 MG CAPS 1 capsule   Pramoxine-Dimethicone (GOLD BOND INTENSIVE HEALING EX) Apply 1 application topically daily as needed (eczema).   torsemide (DEMADEX) 20 MG tablet Take 1 tablet (20 mg total) by mouth daily.   vitamin E 1000 UNIT capsule  Take by mouth.   Zinc 25 MG TABS Take 1 tablet by mouth daily at 12 noon.   No current facility-administered medications on file prior to visit.     Allergies:   Morphine and related and Timolol   Social History   Tobacco Use   Smoking status: Never   Smokeless tobacco: Never  Substance Use Topics   Alcohol use: No   Drug use: No    Family History: family history is negative for CAD.  ROS:   Please see the history of present illness.  Additional pertinent ROS: Constitutional: Negative for chills, fever, night sweats, unintentional weight loss  HENT: Negative for ear pain and hearing loss.   Eyes: Negative for loss of vision and eye pain.  Respiratory: Negative for cough, wheezing. Positive for sputum. Cardiovascular: See HPI. Gastrointestinal: Negative for abdominal pain, melena, and hematochezia.  Genitourinary: Negative for dysuria and hematuria.  Musculoskeletal: Negative for falls and myalgias.  Skin: Negative for itching and rash.  Neurological: Negative for focal weakness, focal sensory changes and loss of consciousness.  Endo/Heme/Allergies: Does not bruise/bleed easily.     EKGs/Labs/Other Studies Reviewed:    The following studies were reviewed today:  Right/Left Heart Cath 10/22/2021:   Prox RCA lesion is 65% stenosed.   Prox LAD to Mid LAD lesion is 65% stenosed.   1st Mrg lesion is 90% stenosed.   There is mild to moderate left ventricular systolic dysfunction.   LV end diastolic pressure is normal.   LV end diastolic pressure is normal.   The left ventricular ejection fraction is 35-45% by visual estimate.   Hemodynamic findings consistent with pulmonary hypertension.   There is moderate aortic valve stenosis.   CONCLUSION: LAD contains 60 to 70% [place] 010 proximal to mid stenosis. Right coronary contains 60 to 70% proximal and 50% distal stenosis. Circumflex gives origin to 3 obtuse marginals.  The first obtuse marginal ostial 90% stenosis. Left  main is widely patent. Mild global left ventricular hypokinesis with EF 40 to 45%.  Normal LVEDP. Normal pulmonary pressures and capillary wedge. 11 mm peak to peak gradient across the aortic valve.  Findings are consistent with mild to moderate aortic stenosis.   RECOMMENDATIONS: Aggressive preventive therapy Guideline directed therapy for systolic heart failure Consider PCI for symptomatic progression of coronary disease. Recommend instituting prophylactic anti-ischemic therapy which will be done in part by guideline directed therapy for systolic heart failure.   Diagnostic Dominance: Right    CTA Chest 10/20/2021: COMPARISON:  Chest x-ray of October 14, 2021.   FINDINGS: Cardiovascular: Calcified atherosclerotic plaque of the thoracic aorta. Tortuous great vessels in the chest. Limited assessment based on phase of contrast enhancement. Calcified coronary artery disease. Heart size moderately enlarged. No signs of suspecting antral pericardial effusion.   Main pulmonary artery at 400 Hounsfield unit density. No signs of central, lobar or segmental level embolus with limited assessment particularly  at the lung bases due to respiratory motion also general limited assessment of more peripheral vessels due to respiratory motion on the current study.   Mediastinum/Nodes: Mildly patulous and diffusely mildly thickened esophagus. No adenopathy in the chest.   Lungs/Pleura: Parenchymal assessment mildly limited by respiratory motion. No gross consolidative process. No signs of effusion or pneumothorax. Airways are patent.   Upper Abdomen: Incidental imaging of upper abdominal contents without acute process.   Musculoskeletal: No acute bone finding. No destructive bone process. Spinal degenerative changes.   Review of the MIP images confirms the above findings.   IMPRESSION: No signs of central, lobar or segmental level pulmonary embolus with limited assessment of more  peripheral vessels due to respiratory motion on the current study.   Cardiomegaly with coronary artery disease.   Mildly patulous and diffusely mildly thickened esophagus. Correlate with any symptoms of esophageal dysmotility and or esophagitis.   Aortic atherosclerosis.  Echo 10/17/2021:  1. Global hypokinesis with akinesis of the inferolateral wall; overall  moderate LV dysfunction; calcified aortic valve with probable moderate AS  (mean gradient 12 mmHg; AVA calculated at 0.9 cm2 suggesting severe AS but  DI not significant 0.35).   2. Left ventricular ejection fraction, by estimation, is 30 to 35%. The  left ventricle has moderately decreased function. The left ventricle  demonstrates regional wall motion abnormalities (see scoring  diagram/findings for description). Left ventricular   diastolic parameters are consistent with Grade I diastolic dysfunction  (impaired relaxation).   3. Right ventricular systolic function is mildly reduced. The right  ventricular size is normal. There is mildly elevated pulmonary artery  systolic pressure.   4. The mitral valve is normal in structure. No evidence of mitral valve  regurgitation. No evidence of mitral stenosis. Severe mitral annular  calcification.   5. The aortic valve is calcified. Aortic valve regurgitation is not  visualized. Moderate aortic valve stenosis.   6. The inferior vena cava is normal in size with greater than 50%  respiratory variability, suggesting right atrial pressure of 3 mmHg.   Comparison(s): No prior Echocardiogram.   EKG:  EKG is personally reviewed.   12/11/2021: not ordered today  Recent Labs: 10/15/2021: Magnesium 2.2 10/16/2021: ALT 24; TSH 0.829 10/17/2021: B Natriuretic Peptide 363.8 10/22/2021: Hemoglobin 13.7; Platelets 409 10/23/2021: BUN 33; Creatinine, Ser 0.70; Potassium 4.8; Sodium 133   Recent Lipid Panel    Component Value Date/Time   CHOL 177 10/19/2021 0503   TRIG 104 10/19/2021 0503    HDL 36 (L) 10/19/2021 0503   CHOLHDL 4.9 10/19/2021 0503   VLDL 21 10/19/2021 0503   LDLCALC 120 (H) 10/19/2021 0503    Physical Exam:    VS:  BP 134/64 (BP Location: Right Arm, Patient Position: Sitting, Cuff Size: Large)    Pulse 75    Ht 4\' 10"  (1.473 m)    Wt 184 lb 9.6 oz (83.7 kg)    SpO2 93%    BMI 38.58 kg/m     Wt Readings from Last 3 Encounters:  12/11/21 184 lb 9.6 oz (83.7 kg)  10/23/21 184 lb 4.9 oz (83.6 kg)    GEN: Well nourished, well developed in no acute distress HEENT: Normal, moist mucous membranes NECK: No JVD CARDIAC: regular rhythm, normal S1 and S2, no rubs or gallops. 2/6 aortic murmur. VASCULAR: Radial and DP pulses 2+ bilaterally. No carotid bruits RESPIRATORY:  Clear to auscultation without rales, wheezing or rhonchi  ABDOMEN: Soft, non-tender, non-distended MUSCULOSKELETAL:  Ambulates independently SKIN: Warm  and dry, no pitting edema. Firmness of skin on dorsal LLE NEUROLOGIC:  Alert and oriented x 3. No focal neuro deficits noted. PSYCHIATRIC:  Normal affect    ASSESSMENT:    1. NICM (nonischemic cardiomyopathy) ([place])   2. Nonocclusive coronary atherosclerosis of native coronary artery   3. Essential hypertension   4. Pure hypercholesterolemia   5. History of COVID-19   6. Chronic combined systolic and diastolic heart failure ([place])   7. Nonrheumatic aortic valve stenosis    PLAN:    Recent hospitalization for acute systolic and diastolic heart failure, now chronic Moderate aortic stenosis Presumed non-ischemic cardiomyopathy Moderate nonobstructive CAD -continue aspirin 81 mg daily, atorvastatin 80 mg daily for moderate nonobstructive CAD -continue losartan 25 mg daily, metoprolol succinate (increasing to 25) mg daily for cardiomyopathy -continue torsemide -discussed monitoring and natural history of aortic stenosis. Follow up echo ordered  History of covid -recovering  Hypertension: -near goal today -meds as above  Cardiac risk  counseling and prevention recommendations: -recommend heart healthy/Mediterranean diet, with whole grains, fruits, vegetable, fish, lean meats, nuts, and olive oil. Limit salt. -recommend moderate walking, 3-5 times/week for 30-50 minutes each session. Aim for at least 150 minutes.week. Goal should be pace of 3 miles/hours, or walking 1.5 miles in 30 minutes -recommend avoidance of tobacco products. Avoid excess alcohol.  Plan for follow up: 3 months or sooner as needed.  Buford Dresser, MD, PhD, [facility]    Medication Adjustments/Labs and Tests Ordered: Current medicines are reviewed at length with the patient today.  Concerns regarding medicines are outlined above.   Orders Placed This Encounter  Procedures   ECHOCARDIOGRAM COMPLETE   Meds ordered this encounter  Medications   metoprolol succinate (TOPROL-XL) 25 MG 24 hr tablet    Sig: Take 1 tablet (25 mg total) by mouth daily.    Dispense:  90 tablet    Refill:  3   Patient Instructions  Medication Instructions:  1) Increase Metoprolol to 25 mg (1 tablet) daily  *If you need a refill on your cardiac medications before your next appointment, please call your pharmacy*   Lab Work: None ordered today   Testing/Procedures: Your physician has requested that you have an echocardiogram in March, 2023. Echocardiography is a painless test that uses sound waves to create images of your heart. It provides your doctor with information about the size and shape of your heart and how well your hearts chambers and valves are working. This procedure takes approximately one hour. There are no restrictions for this procedure. [place], you and your health needs are our priority.  As part of our continuing mission to provide you with exceptional heart care, we have created designated Provider Care Teams.  These Care Teams include your primary  Cardiologist (physician) and Advanced Practice Providers (APPs -  Physician Assistants and Nurse Practitioners) who all work together to provide you with the care you need, when you need it.  We recommend signing up for the patient portal called "MyChart".  Sign up information is provided on this After Visit Summary.  MyChart is used to connect with patients for Virtual Visits (Telemedicine).  Patients are able to view lab/test results, encounter notes, upcoming appointments, etc.  Non-urgent messages can be sent to your provider as well.   To learn more about what you can do with MyChart, go to [email or web address].    Your next appointment:  3 month(s)  The format for your next appointment:   In Person  Provider:   Buford Dresser, MD       [facility],acting as a scribe for Buford Dresser, MD.,have documented all relevant documentation on the behalf of Buford Dresser, MD,as directed by  Buford Dresser, MD while in the presence of Buford Dresser, MD.  I, Buford Dresser, MD, have reviewed all documentation for this visit. The documentation on 01/03/22 for the exam, diagnosis, procedures, and orders are all accurate and complete.   Signed, Buford Dresser, MD PhD 12/11/2021     [place]

## 2021-12-31 DIAGNOSIS — I350 Nonrheumatic aortic (valve) stenosis: Secondary | ICD-10-CM | POA: Diagnosis not present

## 2021-12-31 DIAGNOSIS — H8110 Benign paroxysmal vertigo, unspecified ear: Secondary | ICD-10-CM | POA: Diagnosis not present

## 2021-12-31 DIAGNOSIS — E119 Type 2 diabetes mellitus without complications: Secondary | ICD-10-CM | POA: Diagnosis not present

## 2021-12-31 DIAGNOSIS — I2510 Atherosclerotic heart disease of native coronary artery without angina pectoris: Secondary | ICD-10-CM | POA: Diagnosis not present

## 2021-12-31 DIAGNOSIS — E7800 Pure hypercholesterolemia, unspecified: Secondary | ICD-10-CM | POA: Diagnosis not present

## 2021-12-31 DIAGNOSIS — I5041 Acute combined systolic (congestive) and diastolic (congestive) heart failure: Secondary | ICD-10-CM | POA: Diagnosis not present

## 2022-01-03 ENCOUNTER — Encounter (HOSPITAL_BASED_OUTPATIENT_CLINIC_OR_DEPARTMENT_OTHER): Payer: Self-pay | Admitting: Cardiology

## 2022-01-09 IMAGING — CT CT ANGIO CHEST
2 of 7 series · 18 of 46 positions shown · IV contrast (APPLIED)
Comparison: Chest x-ray of October 14, 2021.

CLINICAL DATA: A 75-year-old female presents for evaluation of
chest pain or shortness of breath.

EXAM:
CT ANGIOGRAPHY CHEST WITH CONTRAST
TECHNIQUE: Multidetector CT imaging of the chest was performed using the
standard protocol during bolus administration of intravenous
contrast. Multiplanar CT image reconstructions and MIPs were
obtained to evaluate the vascular anatomy.
CONTRAST:  75mL OMNIPAQUE IOHEXOL 350 MG/ML SOLN

[Series 5: thins · axial · 0.66mm/px · z∈[-220,+2]mm · 16 of 252 slices shown]
[im 15/252  lung]
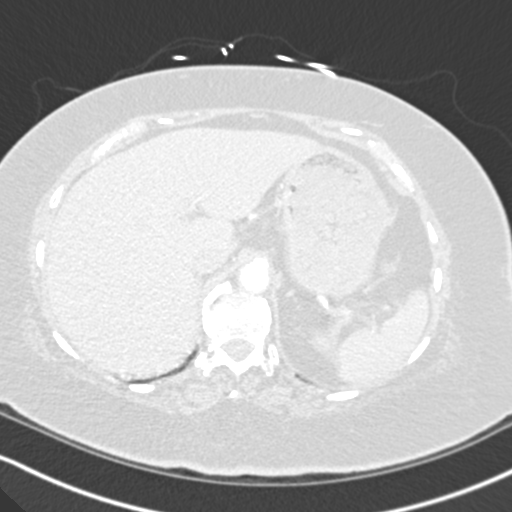
[im 30/252  soft-tissue]
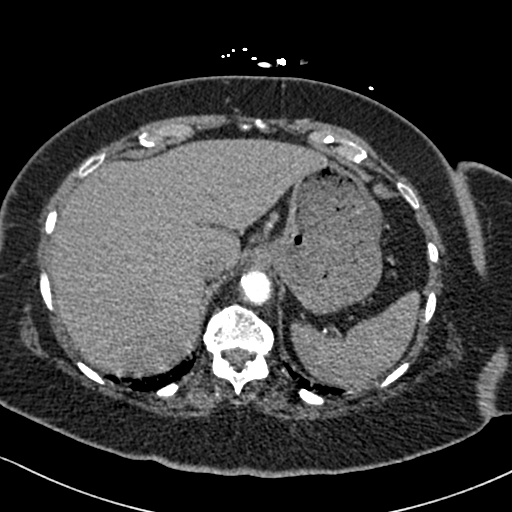
[im 45/252  lung]
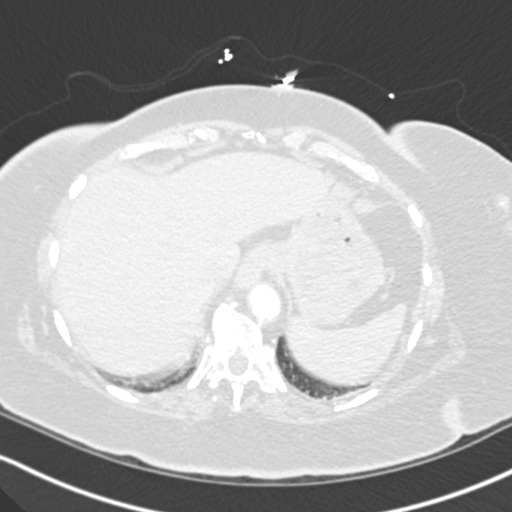
[im 60/252  soft-tissue]
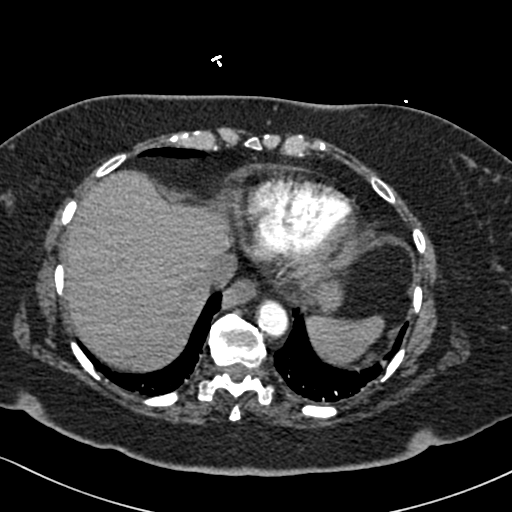
[im 74/252  lung]
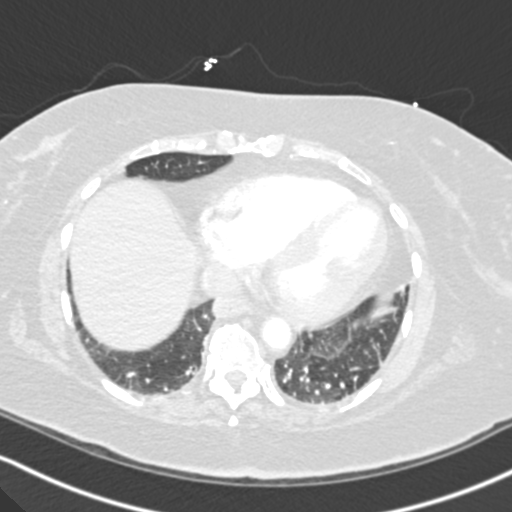
[im 89/252  soft-tissue]
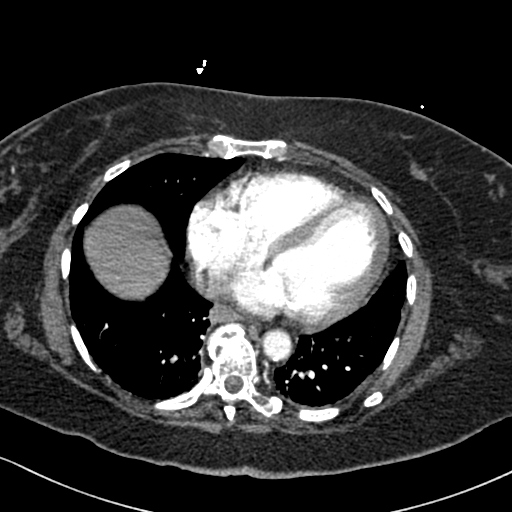
[im 104/252  lung]
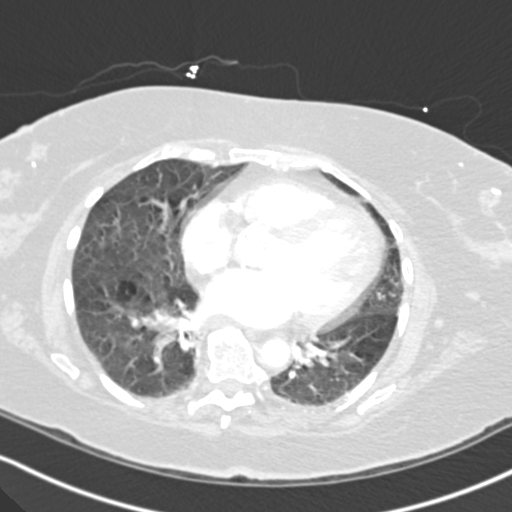
[im 119/252  soft-tissue]
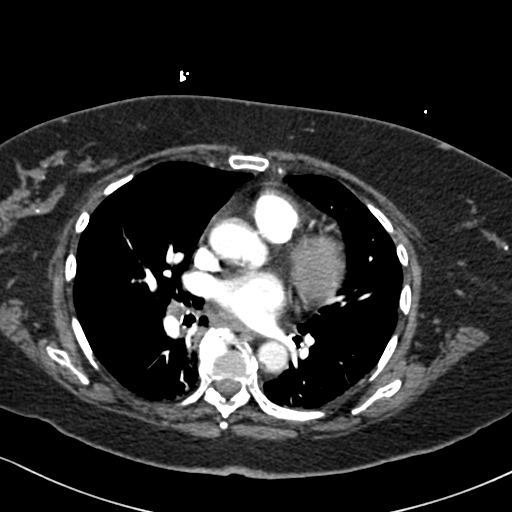
[im 133/252  lung]
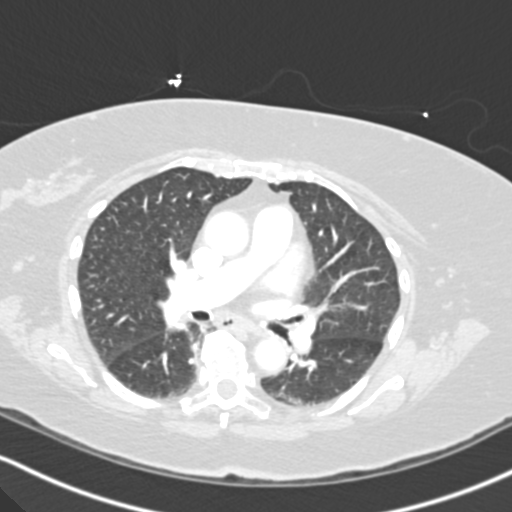
[im 148/252  soft-tissue]
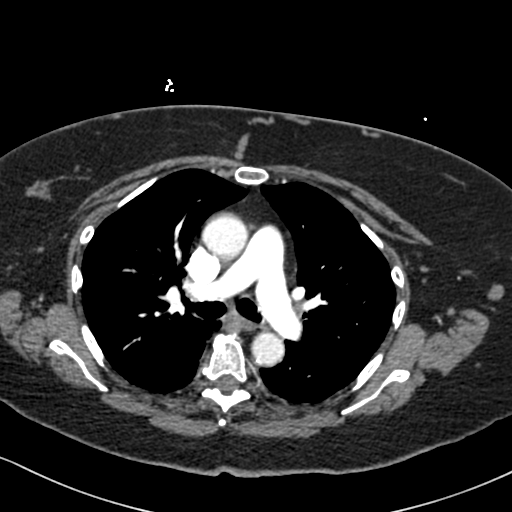
[im 163/252  lung]
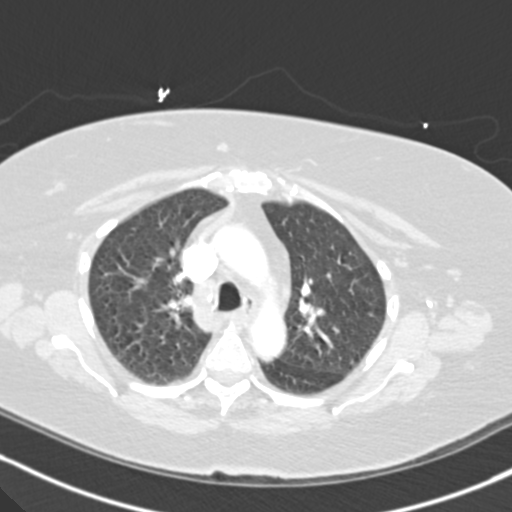
[im 178/252  soft-tissue]
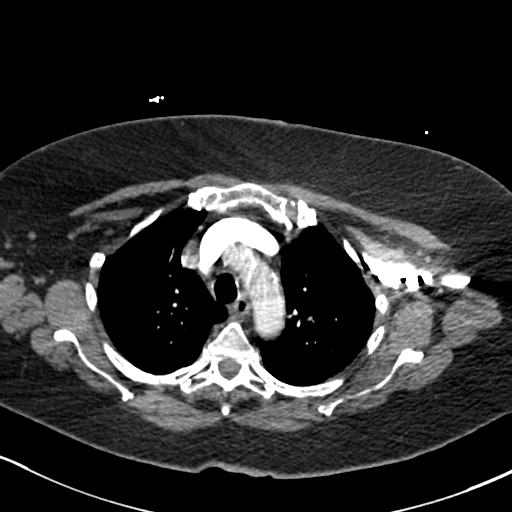
[im 192/252  lung]
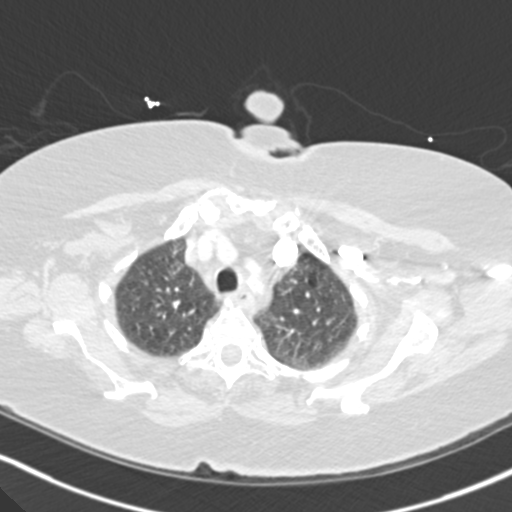
[im 207/252  soft-tissue]
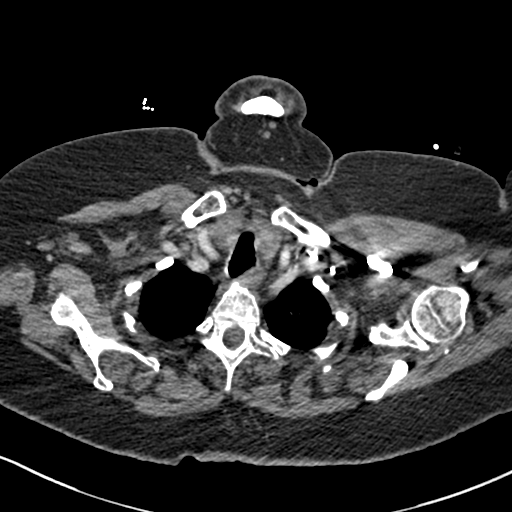
[im 222/252  lung]
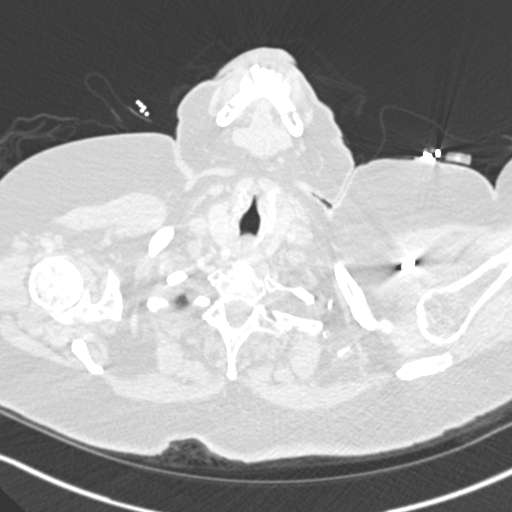
[im 237/252  soft-tissue]
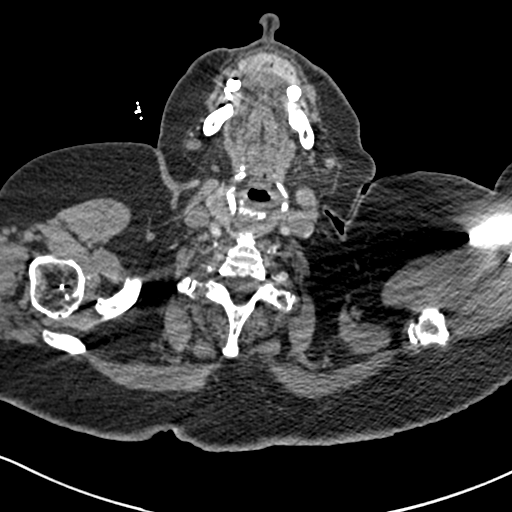

[Series 6: coronal mpr · coronal · 0.40mm/px · 2 of 72 slices shown]
[im 24/72  soft-tissue]
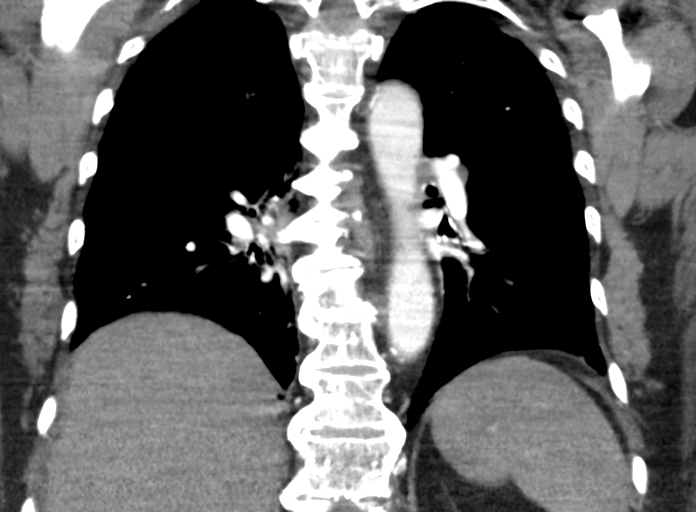
[im 48/72  soft-tissue]
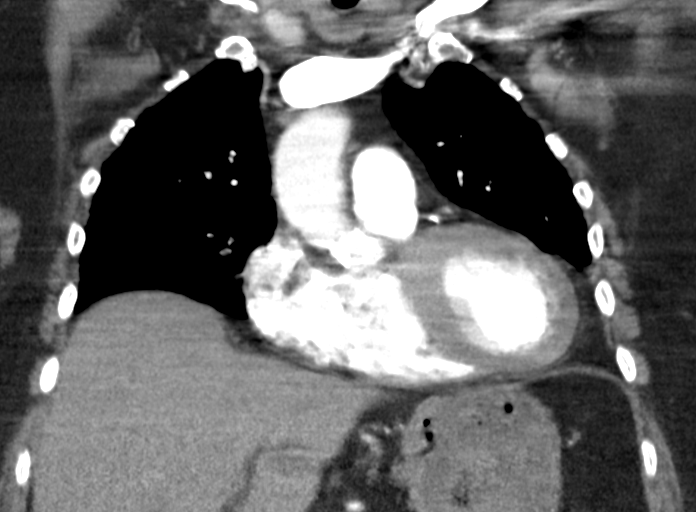

[18 of 46 positions shown; findings below may reference images not displayed]

FINDINGS: Cardiovascular: Calcified atherosclerotic plaque of the thoracic
aorta. Tortuous great vessels in the chest. Limited assessment based
on phase of contrast enhancement. Calcified coronary artery disease.
Heart size moderately enlarged. No signs of suspecting antral
pericardial effusion.

Main pulmonary artery at 400 Hounsfield unit density. No signs of
central, lobar or segmental level embolus with limited assessment
particularly at the lung bases due to respiratory motion also
general limited assessment of more peripheral vessels due to
respiratory motion on the current study.

Mediastinum/Nodes: Mildly patulous and diffusely mildly thickened
esophagus. No adenopathy in the chest.

Lungs/Pleura: Parenchymal assessment mildly limited by respiratory
motion. No gross consolidative process. No signs of effusion or
pneumothorax. Airways are patent.

Upper Abdomen: Incidental imaging of upper abdominal contents
without acute process.

Musculoskeletal: No acute bone finding. No destructive bone process.
Spinal degenerative changes.

Review of the MIP images confirms the above findings.
IMPRESSION: No signs of central, lobar or segmental level pulmonary embolus with
limited assessment of more peripheral vessels due to respiratory
motion on the current study.

Cardiomegaly with coronary artery disease.

Mildly patulous and diffusely mildly thickened esophagus. Correlate
with any symptoms of esophageal dysmotility and or esophagitis.

Aortic atherosclerosis.

Aortic Atherosclerosis ([id number].P).

## 2022-02-06 ENCOUNTER — Other Ambulatory Visit: Payer: Self-pay

## 2022-02-06 ENCOUNTER — Ambulatory Visit (INDEPENDENT_AMBULATORY_CARE_PROVIDER_SITE_OTHER): Payer: Medicare HMO

## 2022-02-06 DIAGNOSIS — I5042 Chronic combined systolic (congestive) and diastolic (congestive) heart failure: Secondary | ICD-10-CM | POA: Diagnosis not present

## 2022-02-06 DIAGNOSIS — I428 Other cardiomyopathies: Secondary | ICD-10-CM

## 2022-02-07 LAB — ECHOCARDIOGRAM COMPLETE
AR max vel: 0.89 cm2
AV Area VTI: 0.85 cm2
AV Area mean vel: 0.86 cm2
AV Mean grad: 22 mmHg
AV Peak grad: 35.8 mmHg
Ao pk vel: 2.99 m/s
Area-P 1/2: 3.16 cm2
S' Lateral: 3.06 cm

## 2022-02-11 ENCOUNTER — Telehealth (HOSPITAL_BASED_OUTPATIENT_CLINIC_OR_DEPARTMENT_OTHER): Payer: Self-pay

## 2022-02-11 NOTE — Telephone Encounter (Addendum)
Left message for patient to call back ? ? ? ?----- Message from Buford Dresser, MD sent at 02/10/2022  8:53 AM EDT ----- ?Pump function has improved on most recent echo. With better pump function, the aortic valve gradient is now moderate, which is expected. Continue current management. ?

## 2022-02-26 ENCOUNTER — Other Ambulatory Visit: Payer: Self-pay

## 2022-02-26 ENCOUNTER — Inpatient Hospital Stay: Payer: Self-pay

## 2022-02-26 ENCOUNTER — Emergency Department (HOSPITAL_COMMUNITY): Payer: Medicare HMO

## 2022-02-26 ENCOUNTER — Inpatient Hospital Stay (HOSPITAL_COMMUNITY)
Admission: EM | Admit: 2022-02-26 | Discharge: 2022-03-05 | DRG: 280 | Disposition: A | Discharge status: Home Health | Payer: Medicare HMO | Attending: Internal Medicine | Admitting: Internal Medicine

## 2022-02-26 ENCOUNTER — Encounter (HOSPITAL_COMMUNITY): Payer: Self-pay | Admitting: Emergency Medicine

## 2022-02-26 DIAGNOSIS — I5021 Acute systolic (congestive) heart failure: Secondary | ICD-10-CM | POA: Diagnosis not present

## 2022-02-26 DIAGNOSIS — I350 Nonrheumatic aortic (valve) stenosis: Secondary | ICD-10-CM | POA: Diagnosis present

## 2022-02-26 DIAGNOSIS — K7200 Acute and subacute hepatic failure without coma: Secondary | ICD-10-CM | POA: Diagnosis not present

## 2022-02-26 DIAGNOSIS — N179 Acute kidney failure, unspecified: Secondary | ICD-10-CM | POA: Diagnosis present

## 2022-02-26 DIAGNOSIS — Z7189 Other specified counseling: Secondary | ICD-10-CM

## 2022-02-26 DIAGNOSIS — S36119A Unspecified injury of liver, initial encounter: Secondary | ICD-10-CM | POA: Diagnosis present

## 2022-02-26 DIAGNOSIS — R7303 Prediabetes: Secondary | ICD-10-CM | POA: Diagnosis not present

## 2022-02-26 DIAGNOSIS — R570 Cardiogenic shock: Secondary | ICD-10-CM

## 2022-02-26 DIAGNOSIS — R0902 Hypoxemia: Secondary | ICD-10-CM | POA: Diagnosis not present

## 2022-02-26 DIAGNOSIS — I2510 Atherosclerotic heart disease of native coronary artery without angina pectoris: Secondary | ICD-10-CM | POA: Diagnosis not present

## 2022-02-26 DIAGNOSIS — I4510 Unspecified right bundle-branch block: Secondary | ICD-10-CM | POA: Diagnosis present

## 2022-02-26 DIAGNOSIS — Z8616 Personal history of COVID-19: Secondary | ICD-10-CM | POA: Diagnosis not present

## 2022-02-26 DIAGNOSIS — I213 ST elevation (STEMI) myocardial infarction of unspecified site: Secondary | ICD-10-CM

## 2022-02-26 DIAGNOSIS — I5042 Chronic combined systolic (congestive) and diastolic (congestive) heart failure: Secondary | ICD-10-CM | POA: Diagnosis not present

## 2022-02-26 DIAGNOSIS — E669 Obesity, unspecified: Secondary | ICD-10-CM | POA: Diagnosis not present

## 2022-02-26 DIAGNOSIS — I110 Hypertensive heart disease with heart failure: Secondary | ICD-10-CM | POA: Diagnosis present

## 2022-02-26 DIAGNOSIS — I249 Acute ischemic heart disease, unspecified: Secondary | ICD-10-CM | POA: Diagnosis not present

## 2022-02-26 DIAGNOSIS — R42 Dizziness and giddiness: Secondary | ICD-10-CM | POA: Diagnosis present

## 2022-02-26 DIAGNOSIS — I5082 Biventricular heart failure: Secondary | ICD-10-CM | POA: Diagnosis present

## 2022-02-26 DIAGNOSIS — I499 Cardiac arrhythmia, unspecified: Secondary | ICD-10-CM | POA: Diagnosis not present

## 2022-02-26 DIAGNOSIS — R0602 Shortness of breath: Secondary | ICD-10-CM

## 2022-02-26 DIAGNOSIS — Z885 Allergy status to narcotic agent status: Secondary | ICD-10-CM

## 2022-02-26 DIAGNOSIS — Z20822 Contact with and (suspected) exposure to covid-19: Secondary | ICD-10-CM | POA: Diagnosis not present

## 2022-02-26 DIAGNOSIS — Z96653 Presence of artificial knee joint, bilateral: Secondary | ICD-10-CM | POA: Diagnosis present

## 2022-02-26 DIAGNOSIS — I428 Other cardiomyopathies: Secondary | ICD-10-CM | POA: Diagnosis present

## 2022-02-26 DIAGNOSIS — I441 Atrioventricular block, second degree: Secondary | ICD-10-CM

## 2022-02-26 DIAGNOSIS — E785 Hyperlipidemia, unspecified: Secondary | ICD-10-CM | POA: Diagnosis not present

## 2022-02-26 DIAGNOSIS — Z8249 Family history of ischemic heart disease and other diseases of the circulatory system: Secondary | ICD-10-CM

## 2022-02-26 DIAGNOSIS — Z713 Dietary counseling and surveillance: Secondary | ICD-10-CM

## 2022-02-26 DIAGNOSIS — I272 Pulmonary hypertension, unspecified: Secondary | ICD-10-CM | POA: Diagnosis present

## 2022-02-26 DIAGNOSIS — L8990 Pressure ulcer of unspecified site, unspecified stage: Secondary | ICD-10-CM | POA: Insufficient documentation

## 2022-02-26 DIAGNOSIS — Z789 Other specified health status: Secondary | ICD-10-CM | POA: Diagnosis not present

## 2022-02-26 DIAGNOSIS — Z888 Allergy status to other drugs, medicaments and biological substances status: Secondary | ICD-10-CM

## 2022-02-26 DIAGNOSIS — Z515 Encounter for palliative care: Secondary | ICD-10-CM

## 2022-02-26 DIAGNOSIS — I2111 ST elevation (STEMI) myocardial infarction involving right coronary artery: Secondary | ICD-10-CM

## 2022-02-26 DIAGNOSIS — E871 Hypo-osmolality and hyponatremia: Secondary | ICD-10-CM | POA: Diagnosis present

## 2022-02-26 DIAGNOSIS — I509 Heart failure, unspecified: Secondary | ICD-10-CM

## 2022-02-26 DIAGNOSIS — R188 Other ascites: Secondary | ICD-10-CM | POA: Diagnosis not present

## 2022-02-26 DIAGNOSIS — R001 Bradycardia, unspecified: Secondary | ICD-10-CM | POA: Diagnosis present

## 2022-02-26 DIAGNOSIS — I2119 ST elevation (STEMI) myocardial infarction involving other coronary artery of inferior wall: Secondary | ICD-10-CM | POA: Diagnosis not present

## 2022-02-26 DIAGNOSIS — Z6841 Body Mass Index (BMI) 40.0 and over, adult: Secondary | ICD-10-CM

## 2022-02-26 DIAGNOSIS — K219 Gastro-esophageal reflux disease without esophagitis: Secondary | ICD-10-CM | POA: Diagnosis present

## 2022-02-26 DIAGNOSIS — I361 Nonrheumatic tricuspid (valve) insufficiency: Secondary | ICD-10-CM | POA: Diagnosis present

## 2022-02-26 DIAGNOSIS — Z7982 Long term (current) use of aspirin: Secondary | ICD-10-CM

## 2022-02-26 DIAGNOSIS — I50810 Right heart failure, unspecified: Secondary | ICD-10-CM | POA: Diagnosis not present

## 2022-02-26 DIAGNOSIS — Z66 Do not resuscitate: Secondary | ICD-10-CM | POA: Diagnosis not present

## 2022-02-26 DIAGNOSIS — Z79899 Other long term (current) drug therapy: Secondary | ICD-10-CM

## 2022-02-26 DIAGNOSIS — I440 Atrioventricular block, first degree: Secondary | ICD-10-CM | POA: Diagnosis not present

## 2022-02-26 DIAGNOSIS — R945 Abnormal results of liver function studies: Secondary | ICD-10-CM | POA: Diagnosis not present

## 2022-02-26 LAB — CBC WITH DIFFERENTIAL/PLATELET
Abs Immature Granulocytes: 0.21 10*3/uL — ABNORMAL HIGH (ref 0.00–0.07)
Basophils Absolute: 0 10*3/uL (ref 0.0–0.1)
Basophils Relative: 0 %
Eosinophils Absolute: 0 10*3/uL (ref 0.0–0.5)
Eosinophils Relative: 0 %
HCT: 35.8 % — ABNORMAL LOW (ref 36.0–46.0)
Hemoglobin: 11.8 g/dL — ABNORMAL LOW (ref 12.0–15.0)
Immature Granulocytes: 2 %
Lymphocytes Relative: 25 %
Lymphs Abs: 3 10*3/uL (ref 0.7–4.0)
MCH: 32.7 pg (ref 26.0–34.0)
MCHC: 33 g/dL (ref 30.0–36.0)
MCV: 99.2 fL (ref 80.0–100.0)
Monocytes Absolute: 1 10*3/uL (ref 0.1–1.0)
Monocytes Relative: 8 %
Neutro Abs: 7.9 10*3/uL — ABNORMAL HIGH (ref 1.7–7.7)
Neutrophils Relative %: 65 %
Platelets: 199 10*3/uL (ref 150–400)
RBC: 3.61 MIL/uL — ABNORMAL LOW (ref 3.87–5.11)
RDW: 13.9 % (ref 11.5–15.5)
WBC: 12 10*3/uL — ABNORMAL HIGH (ref 4.0–10.5)
nRBC: 0 % (ref 0.0–0.2)

## 2022-02-26 LAB — ECHOCARDIOGRAM COMPLETE
AR max vel: 0.82 cm2
AV Area VTI: 0.85 cm2
AV Area mean vel: 0.79 cm2
AV Mean grad: 12 mmHg
AV Peak grad: 20.7 mmHg
Ao pk vel: 2.28 m/s
Area-P 1/2: 2.76 cm2
S' Lateral: 3.9 cm

## 2022-02-26 LAB — I-STAT CHEM 8, ED
BUN: 67 mg/dL — ABNORMAL HIGH (ref 8–23)
Calcium, Ion: 1.02 mmol/L — ABNORMAL LOW (ref 1.15–1.40)
Chloride: 95 mmol/L — ABNORMAL LOW (ref 98–111)
Creatinine, Ser: 2.1 mg/dL — ABNORMAL HIGH (ref 0.44–1.00)
Glucose, Bld: 143 mg/dL — ABNORMAL HIGH (ref 70–99)
HCT: 36 % (ref 36.0–46.0)
Hemoglobin: 12.2 g/dL (ref 12.0–15.0)
Potassium: 4 mmol/L (ref 3.5–5.1)
Sodium: 132 mmol/L — ABNORMAL LOW (ref 135–145)
TCO2: 25 mmol/L (ref 22–32)

## 2022-02-26 LAB — COMPREHENSIVE METABOLIC PANEL
ALT: 95 U/L — ABNORMAL HIGH (ref 0–44)
AST: 140 U/L — ABNORMAL HIGH (ref 15–41)
Albumin: 3.4 g/dL — ABNORMAL LOW (ref 3.5–5.0)
Alkaline Phosphatase: 55 U/L (ref 38–126)
Anion gap: 17 — ABNORMAL HIGH (ref 5–15)
BUN: 65 mg/dL — ABNORMAL HIGH (ref 8–23)
CO2: 22 mmol/L (ref 22–32)
Calcium: 8.9 mg/dL (ref 8.9–10.3)
Chloride: 93 mmol/L — ABNORMAL LOW (ref 98–111)
Creatinine, Ser: 2.01 mg/dL — ABNORMAL HIGH (ref 0.44–1.00)
GFR, Estimated: 26 mL/min — ABNORMAL LOW (ref >60)
Glucose, Bld: 150 mg/dL — ABNORMAL HIGH (ref 70–99)
Potassium: 4 mmol/L (ref 3.5–5.1)
Sodium: 132 mmol/L — ABNORMAL LOW (ref 135–145)
Total Bilirubin: 0.7 mg/dL (ref 0.3–1.2)
Total Protein: 7.2 g/dL (ref 6.5–8.1)

## 2022-02-26 LAB — CBG MONITORING, ED
Glucose-Capillary: 110 mg/dL — ABNORMAL HIGH (ref 70–99)
Glucose-Capillary: 105 mg/dL — ABNORMAL HIGH (ref 70–99)

## 2022-02-26 LAB — TSH: TSH: 2.833 u[IU]/mL (ref 0.350–4.500)

## 2022-02-26 LAB — RESP PANEL BY RT-PCR (FLU A&B, COVID) ARPGX2
Influenza A by PCR: NEGATIVE
Influenza B by PCR: NEGATIVE
SARS Coronavirus 2 by RT PCR: NEGATIVE

## 2022-02-26 LAB — HEPARIN LEVEL (UNFRACTIONATED): Heparin Unfractionated: 0.36 IU/mL (ref 0.30–0.70)

## 2022-02-26 LAB — GLUCOSE, CAPILLARY: Glucose-Capillary: 116 mg/dL — ABNORMAL HIGH (ref 70–99)

## 2022-02-26 LAB — BRAIN NATRIURETIC PEPTIDE: B Natriuretic Peptide: 905.5 pg/mL — ABNORMAL HIGH (ref 0.0–100.0)

## 2022-02-26 LAB — MAGNESIUM: Magnesium: 3.4 mg/dL — ABNORMAL HIGH (ref 1.7–2.4)

## 2022-02-26 MED ORDER — SODIUM CHLORIDE 0.9% FLUSH
10.0000 mL | Freq: Two times a day (BID) | INTRAVENOUS | Status: DC
  Administered 2022-02-27: 10 mL
  Administered 2022-02-27 – 2022-02-28 (×2): 20 mL
  Administered 2022-03-02 – 2022-03-05 (×8): 10 mL

## 2022-02-26 MED ORDER — ATORVASTATIN CALCIUM 80 MG PO TABS
80.0000 mg | ORAL_TABLET | Freq: Every day | ORAL | Status: DC
  Administered 2022-02-27: 80 mg via ORAL
  Filled 2022-02-26: qty 1

## 2022-02-26 MED ORDER — ONDANSETRON HCL 4 MG/2ML IJ SOLN: 4.0000 mg | Freq: Four times a day (QID) | INTRAMUSCULAR | Status: DC | PRN

## 2022-02-26 MED ORDER — ASPIRIN 81 MG PO CHEW
324.0000 mg | CHEWABLE_TABLET | Freq: Once | ORAL | Status: DC
  Filled 2022-02-26: qty 4

## 2022-02-26 MED ORDER — DOBUTAMINE IN D5W 4-5 MG/ML-% IV SOLN
1.0000 ug/kg/min | INTRAVENOUS | Status: DC
  Administered 2022-02-26 – 2022-03-02 (×3): 2.5 ug/kg/min via INTRAVENOUS
  Filled 2022-02-26 (×3): qty 250

## 2022-02-26 MED ORDER — SODIUM CHLORIDE 0.9% FLUSH
3.0000 mL | Freq: Two times a day (BID) | INTRAVENOUS | Status: DC
  Administered 2022-03-01: 3 mL via INTRAVENOUS

## 2022-02-26 MED ORDER — SODIUM CHLORIDE 0.9% FLUSH: 10.0000 mL | INTRAVENOUS | Status: DC | PRN

## 2022-02-26 MED ORDER — NITROGLYCERIN 0.4 MG SL SUBL: 0.4000 mg | SUBLINGUAL_TABLET | SUBLINGUAL | Status: DC | PRN

## 2022-02-26 MED ORDER — ASPIRIN EC 81 MG PO TBEC
81.0000 mg | DELAYED_RELEASE_TABLET | Freq: Every day | ORAL | Status: DC
  Administered 2022-02-27 – 2022-03-05 (×6): 81 mg via ORAL
  Filled 2022-02-26 (×6): qty 1

## 2022-02-26 MED ORDER — CHLORHEXIDINE GLUCONATE CLOTH 2 % EX PADS
6.0000 | MEDICATED_PAD | Freq: Every day | CUTANEOUS | Status: DC
  Administered 2022-02-27 – 2022-03-05 (×7): 6 via TOPICAL

## 2022-02-26 MED ORDER — ATORVASTATIN CALCIUM 40 MG PO TABS: 80.0000 mg | ORAL_TABLET | Freq: Every day | ORAL | Status: DC

## 2022-02-26 MED ORDER — SODIUM CHLORIDE 0.9 % IV SOLN: 250.0000 mL | INTRAVENOUS | Status: DC | PRN

## 2022-02-26 MED ORDER — LACTATED RINGERS IV SOLN: INTRAVENOUS | Status: DC

## 2022-02-26 MED ORDER — SODIUM CHLORIDE 0.9 % IV BOLUS
500.0000 mL | Freq: Once | INTRAVENOUS | Status: AC
  Administered 2022-02-26: 500 mL via INTRAVENOUS

## 2022-02-26 MED ORDER — ACETAMINOPHEN 325 MG PO TABS
650.0000 mg | ORAL_TABLET | ORAL | Status: DC | PRN
  Administered 2022-02-27 – 2022-03-05 (×19): 650 mg via ORAL
  Filled 2022-02-26 (×20): qty 2

## 2022-02-26 MED ORDER — FUROSEMIDE 10 MG/ML IJ SOLN: 40.0000 mg | Freq: Once | INTRAMUSCULAR | Status: DC

## 2022-02-26 MED ORDER — HEPARIN (PORCINE) 25000 UT/250ML-% IV SOLN
1200.0000 [IU]/h | INTRAVENOUS | Status: DC
  Administered 2022-02-26 – 2022-02-27 (×2): 800 [IU]/h via INTRAVENOUS
  Filled 2022-02-26 (×3): qty 250

## 2022-02-26 MED ORDER — HEPARIN BOLUS VIA INFUSION
3000.0000 [IU] | Freq: Once | INTRAVENOUS | Status: AC
  Administered 2022-02-26: 3000 [IU] via INTRAVENOUS
  Filled 2022-02-26: qty 3000

## 2022-02-26 MED ORDER — POLYETHYL GLYCOL-PROPYL GLYCOL 0.4-0.3 % OP GEL: Freq: Four times a day (QID) | OPHTHALMIC | Status: DC | PRN

## 2022-02-26 MED ORDER — INSULIN ASPART 100 UNIT/ML IJ SOLN
0.0000 [IU] | Freq: Three times a day (TID) | INTRAMUSCULAR | Status: DC
  Administered 2022-02-27 (×2): 3 [IU] via SUBCUTANEOUS
  Administered 2022-02-28: 2 [IU] via SUBCUTANEOUS
  Administered 2022-02-28: 3 [IU] via SUBCUTANEOUS
  Administered 2022-02-28: 2 [IU] via SUBCUTANEOUS
  Administered 2022-03-01: 3 [IU] via SUBCUTANEOUS
  Administered 2022-03-01: 2 [IU] via SUBCUTANEOUS
  Administered 2022-03-02: 3 [IU] via SUBCUTANEOUS
  Administered 2022-03-02 – 2022-03-03 (×2): 2 [IU] via SUBCUTANEOUS
  Administered 2022-03-03: 3 [IU] via SUBCUTANEOUS
  Administered 2022-03-04: 2 [IU] via SUBCUTANEOUS
  Administered 2022-03-05: 3 [IU] via SUBCUTANEOUS
  Administered 2022-03-05: 2 [IU] via SUBCUTANEOUS

## 2022-02-26 MED ORDER — PERFLUTREN LIPID MICROSPHERE
1.0000 mL | INTRAVENOUS | Status: AC | PRN
  Administered 2022-02-26: 7 mL via INTRAVENOUS
  Filled 2022-02-26: qty 10

## 2022-02-26 MED ORDER — SODIUM CHLORIDE 0.9% FLUSH: 3.0000 mL | INTRAVENOUS | Status: DC | PRN

## 2022-02-26 NOTE — Consult Note (Signed)
? ?NAME:  Amy [name], MRN:  [id number], DOB:  [birth year], LOS: 0 ?ADMISSION DATE:  02/26/2022, CONSULTATION DATE: 02/26/2022 ?REFERRING MD: [facility], CHIEF COMPLAINT: MI ? ?History of Present Illness:  ?76 year old female with a plethora of health issues that are well documented below, multiple COVID infections, presented with a 1 week of nausea abdominal discomfort and increasing shortness of breath.  She had a ST elevation MI noted on room twelve-lead is probably least a week old.  Not a candidate for invasive interventions due to the fact of the age of the infarct and the fact that her creatinine was 2.03.  She is being treated medically and she can be admitted to progressive care and to [facility] hospitalist service.  Pulmonary critical care will be available as needed ? ?Pertinent  Medical History  ? ?Past Medical History:  ?Diagnosis Date  ? GERD (gastroesophageal reflux disease)   ? Hyperlipidemia   ? Obesity   ? Pre-diabetes   ? ? ? ?Significant Hospital Events: ?Including procedures, antibiotic start and stop dates in addition to other pertinent events   ? ? ?Interim History / Subjective:  ?Admitted to the hospital with 51 week old STEMI ? ?Objective   ?Blood pressure 93/75, pulse (!) 55, temperature 97.8 ?F (36.6 ?C), temperature source Oral, resp. rate 20, SpO2 100 %. ?   ?   ?No intake or output data in the 24 hours ending 02/26/22 0829 ?There were no vitals filed for this visit. ? ?Examination: ?General: Morbidly obese female no acute distress at rest ?HENT: No JVD or lymphadenopathy is appreciated ?Lungs: Diminished breath sounds throughout ?Cardiovascular: Heart sounds are distant ?Abdomen: Obese soft nontender ?Extremities: Warm and dry without edema ?Neuro: Grossly intact without focal defect but somewhat strange affect ?GU: Voids ? ?Resolved Hospital Problem list   ? ? ?Assessment & Plan:  ?STEMI elevation MI approximately 1 week of age not a candidate for invasive procedure due to timeframe and the fact  that her renal function is impaired. ?Nonischemic cardiomyopathy ?Congestive heart failure ?Treat medically ?Admit to progressive care and to [facility] hospitalist service ?Continue cardiology involvement ?O2 as needed ?Pharmaceutical interventions currently with heparin ? ?Acute renal insufficiency ?Lab Results  ?Component Value Date  ? CREATININE 2.10 (H) 02/26/2022  ? CREATININE 2.01 (H) 02/26/2022  ? CREATININE 0.70 10/23/2021  ? ?Continue to monitor ?Cardiologist attempt again gentle diuresis ? ?History of multiple COVID infections in the past ?O2 as needed ? ? ?Best Practice (right click and "Reselect all SmartList Selections" daily)  ? ?Diet/type: Regular consistency (see orders) ?DVT prophylaxis: systemic heparin ?GI prophylaxis: PPI ?Lines: N/A ?Foley:  N/A ?Code Status:  full code ?Last date of multidisciplinary goals of care discussion [tbd] ? ?Labs   ?CBC: ?Recent Labs  ?Lab 02/26/22 ?0050 02/26/22 ?4742  ?WBC 12.0*  --   ?NEUTROABS 7.9*  --   ?HGB 11.8* 12.2  ?HCT 35.8* 36.0  ?MCV 99.2  --   ?PLT 199  --   ? ? ?Basic Metabolic Panel: ?Recent Labs  ?Lab 02/26/22 ?0050 02/26/22 ?5956  ?NA 132* 132*  ?K 4.0 4.0  ?CL 93* 95*  ?CO2 22  --   ?GLUCOSE 150* 143*  ?BUN 65* 67*  ?CREATININE 2.01* 2.10*  ?CALCIUM 8.9  --   ?MG 3.4*  --   ? ?GFR: ?CrCl cannot be calculated (Unknown ideal weight.). ?Recent Labs  ?Lab 02/26/22 ?0050  ?WBC 12.0*  ? ? ?Liver Function Tests: ?Recent Labs  ?Lab 02/26/22 ?0050  ?AST 140*  ?  ALT 95*  ?ALKPHOS 55  ?BILITOT 0.7  ?PROT 7.2  ?ALBUMIN 3.4*  ? ?No results for input(s): LIPASE, AMYLASE in the last 168 hours. ?No results for input(s): AMMONIA in the last 168 hours. ? ?ABG ?   ?Component Value Date/Time  ? PHART 7.426 10/22/2021 1544  ? PCO2ART 50.7 (H) 10/22/2021 1544  ? PO2ART 98 10/22/2021 1544  ? HCO3 33.3 (H) 10/22/2021 1544  ? TCO2 25 02/26/2022 0059  ? O2SAT 98.0 10/22/2021 1544  ?  ? ?Coagulation Profile: ?No results for input(s): INR, PROTIME in the last 168 hours. ? ?Cardiac  Enzymes: ?No results for input(s): CKTOTAL, CKMB, CKMBINDEX, TROPONINI in the last 168 hours. ? ?HbA1C: ?Hgb A1c MFr Bld  ?Date/Time Value Ref Range Status  ?10/15/2021 09:25 PM 6.3 (H) 4.8 - 5.6 % Final  ?  Comment:  ?  (NOTE) ?        Prediabetes: 5.7 - 6.4 ?        Diabetes: >6.4 ?        Glycemic control for adults with diabetes: <7.0 ?  ? ? ?CBG: ?No results for input(s): GLUCAP in the last 168 hours. ? ?Review of Systems:   ?10 point review of system taken, please see HPI for positives and negatives. ?Positive for dyspnea on exertion positive for orthopnea positive for a nonproductive cough no complaints of fever chills or sweats ? ?Past Medical History:  ?She,  has a past medical history of GERD (gastroesophageal reflux disease), Hyperlipidemia, Obesity, and Pre-diabetes.  ? ?Surgical History:  ? ?Past Surgical History:  ?Procedure Laterality Date  ? CARPAL TUNNEL RELEASE Bilateral   ? FEMUR FRACTURE SURGERY    ? REPLACEMENT TOTAL KNEE BILATERAL    ? RIGHT/LEFT HEART CATH AND CORONARY ANGIOGRAPHY N/A 10/22/2021  ? Procedure: RIGHT/LEFT HEART CATH AND CORONARY ANGIOGRAPHY;  Surgeon: Belva Crome, MD;  Location: [facility];  Service: Cardiovascular;  Laterality: N/A;  ? SHOULDER SURGERY Right   ? rotator cuff  ?  ? ?Social History:  ? reports that she has never smoked. She has never used smokeless tobacco. She reports that she does not drink alcohol and does not use drugs.  ? ?Family History:  ?Her family history is negative for CAD.  ? ?Allergies ?Allergies  ?Allergen Reactions  ? Codeine   ?  Other reaction(s): sick  ? Morphine And Related Nausea And Vomiting  ? Morphine Sulfate   ?  Other reaction(s): vomiting  ? Timolol   ?  Other reaction(s): SOB ?Other reaction(s): SOB  ?  ? ?Home Medications  ?Prior to Admission medications   ?Medication Sig Start Date End Date Taking? Authorizing Provider  ?acetaminophen (TYLENOL) 500 MG tablet Take 500 mg by mouth every 6 (six) hours as needed for mild pain or  headache.    [provider]  ?ascorbic acid (VITAMIN C) 500 MG tablet Take by mouth.    [provider]  ?aspirin 81 MG EC tablet Take 81 mg by mouth daily.    [provider]  ?atorvastatin (LIPITOR) 80 MG tablet Take 1 tablet (80 mg total) by mouth daily. 10/24/21   Patrecia Pour, MD  ?B Complex-C (B-COMPLEX WITH VITAMIN C) tablet Take 1 tablet by mouth daily.    [provider]  ?Carboxymethylcellulose Sodium (DRY EYE RELIEF OP) Apply 1 drop to eye 4 (four) times daily as needed (dryness).    [provider]  ?cholecalciferol (VITAMIN D3) 25 MCG (1000 UNIT) tablet 1 tablet  [provider]  ?Cinnamon 500 MG capsule     [provider]  ?Coenzyme Q10 (CO Q-10) 200 MG CAPS 1 capsule with a meal    [provider]  ?Cyanocobalamin (VITAMIN B 12 PO) Take 1 tablet by mouth daily.    [provider]  ?Flaxseed, Linseed, (FLAX SEED OIL) 1000 MG CAPS     [provider]  ?Ginger 500 MG CAPS     [provider]  ?losartan (COZAAR) 25 MG tablet Take 1 tablet (25 mg total) by mouth daily. 10/23/21   Patrecia Pour, MD  ?meclizine (ANTIVERT) 25 MG tablet Take 25 mg by mouth daily as needed for dizziness. 09/12/21   [provider]  ?metoprolol succinate (TOPROL-XL) 25 MG 24 hr tablet Take 1 tablet (25 mg total) by mouth daily. 12/11/21   Buford Dresser, MD  ?Multiple Vitamin (MULTI-VITAMIN) tablet Take 1 tablet by mouth daily.    [provider]  ?Omega-3 Fatty Acids (FISH OIL) 1000 MG CAPS 1 capsule    [provider]  ?Pramoxine-Dimethicone (GOLD BOND INTENSIVE HEALING EX) Apply 1 application topically daily as needed (eczema).    [provider]  ?torsemide (DEMADEX) 20 MG tablet Take 1 tablet (20 mg total) by mouth daily. 10/24/21   Patrecia Pour, MD  ?vitamin E 1000 UNIT capsule Take by mouth.    [provider]  ?Zinc 25 MG TABS Take 1 tablet by mouth daily at 12 noon.     [provider]  ?Pulmonary critical care will be available as needed ? ?Critical care time: 31 min ?  ? ? ?Richardson Landry Kayn Haymore ACNP ?Acute Care Nurse Practitioner ?[place]

## 2022-02-26 NOTE — H&P (Signed)
?History and Physical  ? ? ?Patient: Amy [name] KYH:[id number] DOB: 07/09/1946 ?DOA: 02/26/2022 ?DOS: the patient was seen and examined on 02/26/2022 ?PCP: Shirline Frees, MD  ?Patient coming from: Home - lives with daughter; NOK: Daughter, Tylyn Stankovich, [phone] ? ? ?Chief Complaint: SOB ? ?HPI: Amy [name] is a 76 y.o. female with medical history significant of HLD, prediabetes, chronic combined CHF (recovered EF on most recent echo), and obesity presenting with SOB.  She had someone staying with her who tested positive for "light COVID."  She started a few days later with n/v/d, started Friday but this is improved.   Last night, she developed SOB and R-sided back pain.  She was too weak to get out of bed.  No chest pain.  She does not wear home O2. ? ? ? ?ER Course:  Carryover, per Dr. Roda Shutters: ? ?is  76 y.o. 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 ON evaluation found to have CE 24000 and EKG with ST chagnes as well as new 2:1 AV block. Cardiology evaluated patient in ed  Dx with late presenting stemi, plan for non-emergent cath, continue on heparin drip, hold beta blocker due to av block. Patient base on burden of bradycardia will be evaluated for PPM. Patient also has AKI. ? ? ? ? ?Review of Systems: As mentioned in the history of present illness. All other systems reviewed and are negative. ?Past Medical History:  ?Diagnosis Date  ? GERD (gastroesophageal reflux disease)   ? Hyperlipidemia   ? Obesity   ? Pre-diabetes   ? ?Past Surgical History:  ?Procedure Laterality Date  ? CARPAL TUNNEL RELEASE Bilateral   ? FEMUR FRACTURE SURGERY    ? REPLACEMENT TOTAL KNEE BILATERAL    ? RIGHT/LEFT HEART CATH AND CORONARY ANGIOGRAPHY N/A 10/22/2021  ? Procedure: RIGHT/LEFT HEART CATH AND CORONARY ANGIOGRAPHY;  Surgeon: Belva Crome, MD;   Location: [facility];  Service: Cardiovascular;  Laterality: N/A;  ? SHOULDER SURGERY Right   ? rotator cuff  ? ?Social History:  reports that she has never smoked. She has never used smokeless tobacco. She reports that she does not drink alcohol and does not use drugs. ? ?Allergies  ?Allergen Reactions  ? Timolol Shortness Of Breath  ? Codeine Other (See Comments)  ?  sick  ? Morphine And Related Nausea And Vomiting  ? Other Other (See Comments)  ?  vicryl sutures - rash/redness, delay in healing  ? ? ?Family History  ?Problem Relation Age of Onset  ? CAD Neg Hx   ? ? ?Prior to Admission medications   ?Medication Sig Start Date End Date Taking? Authorizing Provider  ?acetaminophen (TYLENOL) 500 MG tablet Take 500 mg by mouth every 6 (six) hours as needed for mild pain or headache.    [provider]  ?ascorbic acid (VITAMIN C) 500 MG tablet Take by mouth.    [provider]  ?aspirin 81 MG EC tablet Take 81 mg by mouth daily.    [provider]  ?atorvastatin (LIPITOR) 80 MG tablet Take 1 tablet (80 mg total) by mouth daily. 10/24/21   Patrecia Pour, MD  ?B Complex-C (B-COMPLEX WITH VITAMIN C) tablet Take 1 tablet by mouth daily.    [provider]  ?Carboxymethylcellulose Sodium (DRY EYE RELIEF OP) Apply 1 drop to eye 4 (  four) times daily as needed (dryness).    [provider]  ?cholecalciferol (VITAMIN D3) 25 MCG (1000 UNIT) tablet 1 tablet    [provider]  ?Cinnamon 500 MG capsule     [provider]  ?Coenzyme Q10 (CO Q-10) 200 MG CAPS 1 capsule with a meal    [provider]  ?Cyanocobalamin (VITAMIN B 12 PO) Take 1 tablet by mouth daily.    [provider]  ?Flaxseed, Linseed, (FLAX SEED OIL) 1000 MG CAPS     [provider]  ?Ginger 500 MG CAPS     [provider]  ?losartan (COZAAR) 25 MG tablet Take 1 tablet (25 mg total) by mouth daily. 10/23/21   Patrecia Pour, MD  ?meclizine (ANTIVERT) 25 MG  tablet Take 25 mg by mouth daily as needed for dizziness. 09/12/21   [provider]  ?metoprolol succinate (TOPROL-XL) 25 MG 24 hr tablet Take 1 tablet (25 mg total) by mouth daily. 12/11/21   Buford Dresser, MD  ?Multiple Vitamin (MULTI-VITAMIN) tablet Take 1 tablet by mouth daily.    [provider]  ?Omega-3 Fatty Acids (FISH OIL) 1000 MG CAPS 1 capsule    [provider]  ?Pramoxine-Dimethicone (GOLD BOND INTENSIVE HEALING EX) Apply 1 application topically daily as needed (eczema).    [provider]  ?torsemide (DEMADEX) 20 MG tablet Take 1 tablet (20 mg total) by mouth daily. 10/24/21   Patrecia Pour, MD  ?vitamin E 1000 UNIT capsule Take by mouth.    [provider]  ?Zinc 25 MG TABS Take 1 tablet by mouth daily at 12 noon.    [provider]  ? ? ?Physical Exam: ?Vitals:  ? 02/26/22 0530 02/26/22 0636 02/26/22 0700 02/26/22 0900  ?BP: 115/89 125/88 93/75 125/74  ?Pulse: 86 70 (!) 55 72  ?Resp: '18 14 20 '$ (!) 25  ?Temp:      ?TempSrc:      ?SpO2: 97% 99% 100% 100%  ? ?General:  Appears calm and comfortable and is in NAD, appears weak/fatigued ?Eyes:   EOMI, normal lids, iris ?ENT:  grossly normal hearing, lips & tongue, mildly dry mm; artificial dentition ?Neck:  no LAD, masses or thyromegaly ?Cardiovascular:  RRR, no m/r/g. No LE edema.  ?Respiratory:   CTA bilaterally with no wheezes/rales/rhonchi.  Normal respiratory effort. ?Abdomen:  soft, NT, ND ?Skin:  no rash or induration seen on limited exam ?Musculoskeletal:  grossly normal tone BUE/BLE, good ROM, no bony abnormality; chronic L lower leg atrophy and foot collapse ?Lower extremity:  No LE edema.  Limited foot exam with no ulcerations.  2+ distal pulses. ?Psychiatric:  blunted mood and affect, speech fluent and appropriate, AOx3 ?Neurologic:  CN 2-12 grossly intact, moves all extremities in coordinated fashion ? ? ?Radiological Exams on Admission: ?Independently reviewed - see discussion in  A/P where applicable ? ?DG Chest Port 1 View ? ?Result Date: 02/26/2022 ?CLINICAL DATA:  Shortness of breath EXAM: PORTABLE CHEST 1 VIEW COMPARISON:  10/14/2021 FINDINGS: Cardiac shadow is enlarged but stable. Lungs are clear bilaterally. Degenerative changes of the thoracic spine are noted. Postsurgical changes in the right shoulder are noted. IMPRESSION: No acute abnormality noted. Electronically Signed   By: Inez Catalina M.D.   On: 02/26/2022 01:25   ? ?EKG: Independently reviewed.   ?0025 - Sinus bradycardia with rate 41; LVH, RBBB, diffuse ST elevation with reciprocal changes in I, aVL ?0142 - Sinus bradycardia with rate 39; 2:1 AV block; LVH,  RBBB, diffuse ST elevation with reciprocal changes in I, aVL ?0523 - NSR with rate 72; 2:1 AV block; LVH, RBBB, diffuse ST elevation with reciprocal changes in I, aVL ? ? ?Labs on Admission: I have personally reviewed the available labs and imaging studies at the time of the admission. ? ?Pertinent labs:   ? ?Na++ 132 ?Glucose 150 ?BUN 65/Creatinine 2.01/GFR 26; normal in 10/2021 ?Anion gap 17 ?AST 140/ALT 95 ?BNP 905.5 ?WBC 12 ?Hgb 11.8 ?COVID/flu negative ? ? ?Assessment and Plan: ?Principal Problem: ?  STEMI (ST elevation myocardial infarction) ([place]) ?Active Problems: ?  AKI (acute kidney injury) ([place]) ?  Chronic combined systolic and diastolic heart failure ([place]) ?  Dyslipidemia ?  Prediabetes ?  Obesity (BMI 30-39.9) ?  ?STEMI ?-Patient with recent n/v/d, no report of chest pain ?-She has ST changes concerning for STEMI ?-Troponin reported to be >20k (reported as "pending" but RN told me) ?-Cardiology has consulted and thinks this was a late presentation of STEMI ?-She appears likely to benefit from cath once her AKI clears ?-For now, will admit to progressive care on Heparin drip ?-Continue ASA daily ?-She was initially hypotensive and so PCCM was consulted due to concern for cardiogenic shock - but she appears much more stable at this time ?-Needs palliative care  consultation ? ?Second degree AV block ?-Cardiology is requesting EP consult ?-She is recommended to avoid AV nodal blocking agents for now ? ?AKI ?-Normal baseline creatinine, now 2.01 with GFR 26 ?-She r

## 2022-02-26 NOTE — Progress Notes (Signed)
?Cardiology Progress Note  ?Patient ID: Amy [name] ?MRN: [id number] ?DOB: Oct 12, 1946 ?Date of Encounter: 02/26/2022 ? ?Primary Cardiologist: Buford Dresser, MD ? ?Subjective  ? ?Chief Complaint: SOB ? ?HPI: Admitted with late presenting STEMI.  Significantly elevated creatinine.  2-1 block on monitor.  Reports she is short of breath.  Denies any chest pain. ? ?ROS:  ?All other ROS reviewed and negative. Pertinent positives noted in the HPI.    ? ?Inpatient Medications  ?Scheduled Meds: ? aspirin  324 mg Oral Once  ? ?Continuous Infusions: ? heparin 800 Units/hr (02/26/22 0225)  ? ?PRN Meds: ?  ? ?Vital Signs  ? ?Vitals:  ? 02/26/22 0430 02/26/22 0500 02/26/22 0530 02/26/22 0636  ?BP: (!) 122/53 115/61 115/89 125/88  ?Pulse: (!) 32 73 86 70  ?Resp: '18 17 18 14  '$ ?Temp:      ?TempSrc:      ?SpO2: 98% 97% 97% 99%  ? ?No intake or output data in the 24 hours ending 02/26/22 0702 ? ?  12/11/2021  ?  2:20 PM 10/23/2021  ?  5:00 AM 10/22/2021  ?  4:32 AM  ?Last 3 Weights  ?Weight (lbs) 184 lb 9.6 oz 184 lb 4.9 oz 183 lb 10.3 oz  ?Weight (kg) 83.734 kg 83.6 kg 83.3 kg  ?   ? ?Telemetry  ?Overnight telemetry shows sinus bradycardia heart rate 40s-50 bpm, 2-1 block at times, which I personally reviewed.  ? ?ECG  ?The most recent ECG shows sinus rhythm with secondary AV block Mobitz 1, ST elevation in the inferior segments with Q waves, reciprocal ST depressions in the anterolateral leads, which I personally reviewed.  ? ?Physical Exam  ? ?Vitals:  ? 02/26/22 0430 02/26/22 0500 02/26/22 0530 02/26/22 0636  ?BP: (!) 122/53 115/61 115/89 125/88  ?Pulse: (!) 32 73 86 70  ?Resp: '18 17 18 14  '$ ?Temp:      ?TempSrc:      ?SpO2: 98% 97% 97% 99%  ? No intake or output data in the 24 hours ending 02/26/22 0702  ? ?  12/11/2021  ?  2:20 PM 10/23/2021  ?  5:00 AM 10/22/2021  ?  4:32 AM  ?Last 3 Weights  ?Weight (lbs) 184 lb 9.6 oz 184 lb 4.9 oz 183 lb 10.3 oz  ?Weight (kg) 83.734 kg 83.6 kg 83.3 kg  ?  There is no height or weight on  file to calculate BMI.  ?General: Well nourished, well developed, in no acute distress ?Head: Atraumatic, normal size  ?Eyes: PEERLA, EOMI  ?Neck: Supple, no JVD ?Endocrine: No thryomegaly ?Cardiac: Normal S1, S2; RRR; no murmurs, rubs, or gallops ?Lungs: Clear to auscultation bilaterally, no wheezing, rhonchi or rales  ?Abd: Soft, nontender, no hepatomegaly  ?Ext: No edema, pulses 2+ ?Musculoskeletal: No deformities, BUE and BLE strength normal and equal ?Skin: Warm and dry, no rashes   ?Neuro: Alert and oriented to person, place, time, and situation, CNII-XII grossly intact, no focal deficits  ?Psych: Normal mood and affect  ? ?Labs  ?High Sensitivity Troponin:  No results for input(s): TROPONINIHS in the last 720 hours.   ?Cardiac EnzymesNo results for input(s): TROPONINI in the last 168 hours. No results for input(s): TROPIPOC in the last 168 hours.  ?Chemistry ?Recent Labs  ?Lab 02/26/22 ?0050 02/26/22 ?2993  ?NA 132* 132*  ?K 4.0 4.0  ?CL 93* 95*  ?CO2 22  --   ?GLUCOSE 150* 143*  ?BUN 65* 67*  ?CREATININE 2.01* 2.10*  ?CALCIUM 8.9  --   ?  PROT 7.2  --   ?ALBUMIN 3.4*  --   ?AST 140*  --   ?ALT 95*  --   ?ALKPHOS 55  --   ?BILITOT 0.7  --   ?GFRNONAA 26*  --   ?ANIONGAP 17*  --   ?  ?Hematology ?Recent Labs  ?Lab 02/26/22 ?0050 02/26/22 ?7169  ?WBC 12.0*  --   ?RBC 3.61*  --   ?HGB 11.8* 12.2  ?HCT 35.8* 36.0  ?MCV 99.2  --   ?MCH 32.7  --   ?MCHC 33.0  --   ?RDW 13.9  --   ?PLT 199  --   ? ?BNP ?Recent Labs  ?Lab 02/26/22 ?0050  ?BNP 905.5*  ?  ?DDimer No results for input(s): DDIMER in the last 168 hours.  ? ?Radiology  ?DG Chest Port 1 View ? ?Result Date: 02/26/2022 ?CLINICAL DATA:  Shortness of breath EXAM: PORTABLE CHEST 1 VIEW COMPARISON:  10/14/2021 FINDINGS: Cardiac shadow is enlarged but stable. Lungs are clear bilaterally. Degenerative changes of the thoracic spine are noted. Postsurgical changes in the right shoulder are noted. IMPRESSION: No acute abnormality noted. Electronically Signed   By: Inez Catalina M.D.   On: 02/26/2022 01:25   ? ?Cardiac Studies  ?LHC 10/22/2021 ?  Prox RCA lesion is 65% stenosed. ?  Prox LAD to Mid LAD lesion is 65% stenosed. ?  1st Mrg lesion is 90% stenosed. ?  There is mild to moderate left ventricular systolic dysfunction. ?  LV end diastolic pressure is normal. ?  LV end diastolic pressure is normal. ?  The left ventricular ejection fraction is 35-45% by visual estimate. ?  Hemodynamic findings consistent with pulmonary hypertension. ?  There is moderate aortic valve stenosis. ?3 ?TTE 3/23/202 ? 1. Left ventricular ejection fraction, by estimation, is 50 to 55%. The  ?left ventricle has low normal function. The left ventricle demonstrates  ?regional wall motion abnormalities (see scoring diagram/findings for  ?description). Left ventricular diastolic  ? parameters are indeterminate. There is hypokinesis of the left  ?ventricular, entire inferolateral wall.  ? 2. Right ventricular systolic function is normal. The right ventricular  ?size is normal. There is normal pulmonary artery systolic pressure.  ? 3. The mitral valve is abnormal. Trivial mitral valve regurgitation. No  ?evidence of mitral stenosis. Severe mitral annular calcification.  ? 4. The aortic valve is calcified. There is moderate calcification of the  ?aortic valve. There is moderate thickening of the aortic valve. Aortic  ?valve regurgitation is not visualized. Moderate aortic valve stenosis.  ? 5. The inferior vena cava is normal in size with greater than 50%  ?respiratory variability, suggesting right atrial pressure of 3 mmHg.  ? ?Patient Profile  ?Amy [name] is a 76 y.o. female with CAD, heart failure with reduced ejection fraction, moderate aortic stenosis, hyperlipidemia who was admitted on 02/26/2022 with subjective symptoms of feeling poorly found to have late presenting STEMI. ? ?Assessment & Plan  ? ?#Late presenting STEMI ?#Hypotension, likely cardiogenic shock ?#Acute kidney injury ?#Acute liver  injury ?-Symptoms started 1 week ago.  Suspect this is the etiology of her feeling poorly.  Not a candidate for invasive angiography currently.  She has completed her myocardial infarction.  She also has an acute kidney injury that precludes invasive angiography. ?-I am a bit concerned about her with her hypotension and late presenting STEMI.  She has limited options for advanced support given age and overall comorbidities. ?-I did discuss this with her. ?-The  best we can offer is medical management. ?-We will continue aspirin 81 mg daily and heparin drip. ?-Telemetry with 2 1 AV block.  No beta-blocker. ?-With acute kidney injury.  Hold statin. ?-I will try to give her Lasix 40 mg daily.  If she does not respond she really has limited options.  In the setting of a late presenting STEMI I do not believe mechanical support or inotropes will help her.  We can see how she does.  Would recommend aggressive goals of care discussion. ?-Recommend admission to the hospital service.  Again she is not a candidate for invasive procedures at this time. ? ?#Second-degree AV block, Mobitz 2, 2 1 block ?-I will have EP weigh in.  Given that she is not a candidate for invasive angiography likely not a good candidate for pacing.  I believe we are ultimately approaching palliative care with her.  We will see how she does with Lasix.  Avoid AV nodal agents for now. ? ?For questions or updates, please contact [place] ?Please consult [email or web address] for contact info under  ?   ?Signed, ?[place] T. Audie Box, MD, [facility] ?[place]  ?02/26/2022 7:02 AM  ? ?

## 2022-02-26 NOTE — Progress Notes (Signed)
Peripherally Inserted Central Catheter Placement ? ?The IV Nurse has discussed with the patient and/or persons authorized to consent for the patient, the purpose of this procedure and the potential benefits and risks involved with this procedure.  The benefits include less needle sticks, lab draws from the catheter, and the patient may be discharged home with the catheter. Risks include, but not limited to, infection, bleeding, blood clot (thrombus formation), and puncture of an artery; nerve damage and irregular heartbeat and possibility to perform a PICC exchange if needed/ordered by physician.  Alternatives to this procedure were also discussed.  Bard Power PICC patient education guide, fact sheet on infection prevention and patient information card has been provided to patient /or left at bedside.   ? ?PICC Placement Documentation  ?PICC Double Lumen 76/81/15 Right Basilic 39 cm 1 cm (Active)  ?Indication for Insertion or Continuance of Line Vasoactive infusions 02/26/22 2300  ?Exposed Catheter (cm) 1 cm 02/26/22 2300  ?Site Assessment Clean, Dry, Intact 02/26/22 2300  ?Lumen #1 Status Saline locked;Blood return noted;Flushed 02/26/22 2300  ?Lumen #2 Status Saline locked;Flushed;Blood return noted 02/26/22 2300  ?Dressing Type Securing device;Gauze 02/26/22 2300  ?Dressing Status Antimicrobial disc in place;Other (Comment) 02/26/22 2300  ?Safety Lock Not Applicable 72/62/03 5597  ?Line Care Connections checked and tightened 02/26/22 2300  ?Line Adjustment (NICU/IV Team Only) No 02/26/22 2300  ?Dressing Intervention New dressing 02/26/22 2300  ?Dressing Change Due 02/28/22 02/26/22 2300  ? ? ? ? ? ?Amy [name] ?02/26/2022, 11:08 PM ? ?

## 2022-02-26 NOTE — Progress Notes (Signed)
ANTICOAGULATION CONSULT NOTE ? ?Pharmacy Consult for Heparin  ?Indication: chest pain/ACS ? ?Allergies  ?Allergen Reactions  ? Timolol Shortness Of Breath  ? Codeine Other (See Comments)  ?  sick  ? Morphine And Related Nausea And Vomiting  ? Other Other (See Comments)  ?  vicryl sutures - rash/redness, delay in healing  ? ? ?Vital Signs: ?Temp: 97.8 ?F (36.6 ?C) (04/12 0025) ?Temp Source: Oral (04/12 0025) ?BP: 125/74 (04/12 0900) ?Pulse Rate: 72 (04/12 0900) ? ?Labs: ?Recent Labs  ?  02/26/22 ?0050 02/26/22 ?7473 02/26/22 ?1024  ?HGB 11.8* 12.2  --   ?HCT 35.8* 36.0  --   ?PLT 199  --   --   ?HEPARINUNFRC  --   --  0.36  ?CREATININE 2.01* 2.10*  --   ? ? ? ?CrCl cannot be calculated (Unknown ideal weight.). ? ?Assessment: ?76 y/o F with back pain and weakness to start heparin for r/o ACS. Initial heparin level is at goal. No bleeding noted.  ? ?Goal of Therapy:  ?Heparin level 0.3-0.7 units/ml ?Monitor platelets by anticoagulation protocol: Yes ?  ?Plan:  ?Continue heparin gtt 800 units/hr ?Daily CBC/Heparin level ?Monitor for bleeding ? ?Salome Arnt, PharmD, BCPS ?Clinical Pharmacist ?Please see AMION for all pharmacy numbers ?02/26/2022 11:09 AM ? ?

## 2022-02-26 NOTE — Consult Note (Signed)
?Cardiology Consultation:  ? ?Patient ID: Amy [name] ?MRN: [id number]; DOB: 14-Mar-1946 ? ?Admit date: 02/26/2022 ?Date of Consult: 02/26/2022 ? ?PCP:  Shirline Frees, MD ?  ?[facility] Providers ?Cardiologist:  Buford Dresser, MD     ? ? ?Patient Profile:  ? ?Amy [name] is a 76 y.o. female with a hx of HFrEF s/p COVID 10/2021 with recovered EF (50-55% 01/2022), aortic stenosis, hyperlipidemia, GERD, pre-diabetes, and obesity who is being seen 02/26/2022 for the evaluation of chest pain at the request of Dr. Ralene Bathe. ? ?History of Present Illness:  ? ?Amy [name] reports she was at the baseline of her health until this past weekend, she started experience shortness of breath, subjective fever, chills and diarrhea. She thought she was getting some virus infection. Subsequently, she started having intermittent nausea and vomiting, generalized weakness which did not improve with pepto bismol. She says she has poor food intake for the last 2 days. She says she pulled her muscle yesterday and started experiencing right shoulder/back constant mildly sharp ever since then. She called EMS last night due to worsening SOB and generalized weakness. Of note, she was found to have HR 40s and Bp90s by EMS. ED provider was concerned about her troponins (reported 24000) and STE on ECG on admission. Dr. Martinique was contacted and decided no emergent catheterization. ? ?Currently, patient is 2L [place], no acute distress, resting well in her bed. Denies syncope, cough, chest discomfort, heart palpitations, PND, abdominal fullness, dysuria, diarrhea, pedal edema or any bleeding events. ? ?On admission, ?Na 132, Cr 2.01, elevated AST/ALT, WBC 12, BNP 905, TSH wnl ?CXR no acute abnormality ?ECG 2:1 AVB, RBBB (old), inferolateral STE with Q wave ? ?Past Medical History:  ?Diagnosis Date  ? GERD (gastroesophageal reflux disease)   ? Hyperlipidemia   ? Obesity   ? Pre-diabetes   ? ? ?Past Surgical History:  ?Procedure Laterality Date  ?  CARPAL TUNNEL RELEASE Bilateral   ? FEMUR FRACTURE SURGERY    ? REPLACEMENT TOTAL KNEE BILATERAL    ? RIGHT/LEFT HEART CATH AND CORONARY ANGIOGRAPHY N/A 10/22/2021  ? Procedure: RIGHT/LEFT HEART CATH AND CORONARY ANGIOGRAPHY;  Surgeon: Belva Crome, MD;  Location: [facility];  Service: Cardiovascular;  Laterality: N/A;  ? SHOULDER SURGERY Right   ? rotator cuff  ?  ? ?Inpatient Medications: ?Scheduled Meds: ? ?Continuous Infusions: ? heparin 800 Units/hr (02/26/22 0225)  ? ?PRN Meds: ? ? ?Allergies:    ?Allergies  ?Allergen Reactions  ? Codeine   ?  Other reaction(s): sick  ? Morphine And Related Nausea And Vomiting  ? Morphine Sulfate   ?  Other reaction(s): vomiting  ? Timolol   ?  Other reaction(s): SOB ?Other reaction(s): SOB  ? ? ?Social History:   ?Social History  ? ?Socioeconomic History  ? Marital status: Married  ?  Spouse name: Not on file  ? Number of children: Not on file  ? Years of education: Not on file  ? Highest education level: Not on file  ?Occupational History  ? Not on file  ?Tobacco Use  ? Smoking status: Never  ? Smokeless tobacco: Never  ?Substance and Sexual Activity  ? Alcohol use: No  ? Drug use: No  ? Sexual activity: Not on file  ?Other Topics Concern  ? Not on file  ?Social History Narrative  ? Not on file  ? ?Social Determinants of Health  ? ?Financial Resource Strain: Not on file  ?Food Insecurity: Not on file  ?  Transportation Needs: Not on file  ?Physical Activity: Not on file  ?Stress: Not on file  ?Social Connections: Not on file  ?Intimate Partner Violence: Not on file  ?  ?Family History:   ? ?Family History  ?Problem Relation Age of Onset  ? CAD Neg Hx   ?  ? ?ROS:  ?Please see the history of present illness.  ? ?All other ROS reviewed and negative.    ? ?Physical Exam/Data:  ? ?Vitals:  ? 02/26/22 0300 02/26/22 0330 02/26/22 0400 02/26/22 0430  ?BP: (!) 101/46 (!) 104/44 (!) 115/53 (!) 122/53  ?Pulse: (!) 40 (!) 40 (!) 39 (!) 32  ?Resp: '20 18 15 18  '$ ?Temp:       ?TempSrc:      ?SpO2: 98% 98% 98% 98%  ? ?No intake or output data in the 24 hours ending 02/26/22 0455 ? ?  12/11/2021  ?  2:20 PM 10/23/2021  ?  5:00 AM 10/22/2021  ?  4:32 AM  ?Last 3 Weights  ?Weight (lbs) 184 lb 9.6 oz 184 lb 4.9 oz 183 lb 10.3 oz  ?Weight (kg) 83.734 kg 83.6 kg 83.3 kg  ?   ?There is no height or weight on file to calculate BMI.  ?General:  Well nourished, well developed, in no acute distress ?HEENT: normal ?Neck: no JVD ?Vascular: No carotid bruits; Distal pulses 2+ bilaterally ?Cardiac:  bradycardia, normal S1, S2; RRR; no murmur  ?Lungs:  clear to auscultation bilaterally, no wheezing, rhonchi or rales  ?Abd: soft, nontender, no hepatomegaly  ?Ext: no edema ?Musculoskeletal:  No deformities, BUE and BLE strength normal and equal ?Skin: warm and dry  ?Neuro:  CNs 2-12 intact, no focal abnormalities noted ?Psych:  Normal affect  ? ? ?Relevant CV Studies: ?LHC 10/22/21 ?LAD contains 60 to 70% [place] 010 proximal to mid stenosis. ?Right coronary contains 60 to 70% proximal and 50% distal stenosis. ?Circumflex gives origin to 3 obtuse marginals.  The first obtuse marginal ostial 90% stenosis. ?Left main is widely patent. ?Mild global left ventricular hypokinesis with EF 40 to 45%.  Normal LVEDP. ?Normal pulmonary pressures and capillary wedge. ?11 mm peak to peak gradient across the aortic valve.  Findings are consistent with mild to moderate aortic stenosis. ? ?Laboratory Data: ? ?High Sensitivity Troponin:  No results for input(s): TROPONINIHS in the last 720 hours.   ?Chemistry ?Recent Labs  ?Lab 02/26/22 ?0050 02/26/22 ?3419  ?NA 132* 132*  ?K 4.0 4.0  ?CL 93* 95*  ?CO2 22  --   ?GLUCOSE 150* 143*  ?BUN 65* 67*  ?CREATININE 2.01* 2.10*  ?CALCIUM 8.9  --   ?MG 3.4*  --   ?GFRNONAA 26*  --   ?ANIONGAP 17*  --   ?  ?Recent Labs  ?Lab 02/26/22 ?0050  ?PROT 7.2  ?ALBUMIN 3.4*  ?AST 140*  ?ALT 95*  ?ALKPHOS 55  ?BILITOT 0.7  ? ?Lipids No results for input(s): CHOL, TRIG, HDL, LABVLDL, LDLCALC,  CHOLHDL in the last 168 hours.  ?Hematology ?Recent Labs  ?Lab 02/26/22 ?0050 02/26/22 ?3790  ?WBC 12.0*  --   ?RBC 3.61*  --   ?HGB 11.8* 12.2  ?HCT 35.8* 36.0  ?MCV 99.2  --   ?MCH 32.7  --   ?MCHC 33.0  --   ?RDW 13.9  --   ?PLT 199  --   ? ?Thyroid  ?Recent Labs  ?Lab 02/26/22 ?0050  ?TSH 2.833  ?  ?BNP ?Recent Labs  ?Lab 02/26/22 ?0050  ?  BNP 905.5*  ?  ?DDimer No results for input(s): DDIMER in the last 168 hours. ? ? ?Radiology/Studies:  ?DG Chest Port 1 View ? ?Result Date: 02/26/2022 ?CLINICAL DATA:  Shortness of breath EXAM: PORTABLE CHEST 1 VIEW COMPARISON:  10/14/2021 FINDINGS: Cardiac shadow is enlarged but stable. Lungs are clear bilaterally. Degenerative changes of the thoracic spine are noted. Postsurgical changes in the right shoulder are noted. IMPRESSION: No acute abnormality noted. Electronically Signed   By: Inez Catalina M.D.   On: 02/26/2022 01:25   ? ? ?Assessment and Plan:  ?#Late-presenting STEMI ?-continue telemetry ?-continue trend troponin ?-continue ASA + high intensity statin ?-continue heparin gtt for 48h ?-acquire a TTE  ?-continue trend LFT ?-closely monitor her symptoms, if she develops angina or equivalent symptoms, will consider revascularization ? ?#2:1 AV block ?-currently BP stable ?-hold any AV block agent including BB, CCB ?-we will continue follow up with the patient and assess her burden of bradycardia, she may be a candidate for PM. ? ?#AKI ?-management per primary team ? ?Risk Assessment/Risk Scores:  ?   ?TIMI Risk Score for Unstable Angina or Non-ST Elevation MI:   ?The patient's TIMI risk score is 6, which indicates a 41% risk of all cause mortality, new or recurrent myocardial infarction or need for urgent revascularization in the next 14 days.   ? ?For questions or updates, please contact [place] ?Please consult [email or web address] for contact info under  ? ? ?Signed, ?Laurice Record, MD  ?02/26/2022 4:55 AM  ?

## 2022-02-26 NOTE — Consult Note (Signed)
?Consultation Note ?Date: 02/26/2022  ? ?Patient Name: Amy [name]  ?DOB: 11/24/1945  MRN: [id number]  Age / Sex: 76 y.o., female  ?PCP: Amy Frees, MD ?Referring Physician: Karmen Bongo, MD ? ?Reason for Consultation: Establishing goals of care ? ?HPI/Patient Profile: 76 y.o. 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 EMS found her with HR 40s, BP 90s, sats 80% on RA.  Patient was admitted on 02/26/2022 with STEMI, second-degree AV block, AKI, CHF.  ? ?ER Course: evaluation found to have CE 24000 and EKG with ST chagnes as well as new 2:1 AV block. Cardiology evaluated patient in ed  Dx with late presenting stemi, plan for non-emergent cath, continue on heparin drip, hold beta blocker due to av block. Patient base on burden of bradycardia will be evaluated for PPM. Patient also has AKI. ? ?Clinical Assessment and Goals of Care: ?I have reviewed medical records including EPIC notes, labs, and imaging. Received report from primary RN -no acute concerns. ? ?Went to visit patient at bedside - no family/visitors present. Patient was lying in bed awake, alert, oriented, and able to participate in conversation. No signs or non-verbal gestures of pain or discomfort noted. No respiratory distress, increased work of breathing, or secretions noted.  Patient endorses feeling "much better" than she did upon arrival to ED.  She states "it is the best I felt in 2 weeks."  She is on acute 2L O2 nasal cannula.  Denies pain or shortness of breath. ? ?Met with patient to discuss diagnosis, prognosis, GOC, EOL wishes, disposition, and options. ? ?I introduced Palliative Medicine as specialized medical care for people living with serious illness.  It focuses on providing relief from the symptoms and stress of a serious illness. The goal is to improve quality of life for both the patient and the family. ? ?We discussed a brief life review of the patient as well as functional and nutritional status.  She is widowed with 1 daughter and 2 stepchildren.  Prior to hospitalization she was living in a private residence with her daughter.  She was able to ambulate independently in the home but used a cane for outside/longer distances.  She is functionally independent -able to dress and bathe herself.  She does not have any home health services that visit her.  She reports her appetite has been poor since last Friday. ? ?We discussed patient's current illness and what it means in the larger context of patient's on-going co-morbidities. Patient does have a clear understanding of her current acute medical situation. We reviewed that at this time, she is not a candidate for invasive interventions and medical management is recommended. Patient understands that CHF is a progressive, non-curable disease underlying the patient's current acute medical conditions. Natural disease trajectory and expectations at EOL were discussed. I attempted to elicit values and goals of care important to the patient. The difference between aggressive medical intervention and comfort care was considered in light of the patient's goals of care. Patient would like to continue medical management with watchful waiting. She is hopeful for improvement so she can return home. In the event of a decline, she is open to escalation of care with boundary of DNR/DNI. She would not want long term heroic measures prolonging her life. ? ?Advance directives, concepts specific to code status, artificial feeding and  hydration, and rehospitalization were considered and discussed. She does not have a Living Will or HCPOA. She is interested in completing HCPOA naming her daughter/Amy [name] - chaplain to be consulted.  Patient would also appreciate chaplain visit for emotional support and prayer - she is of [employer]. ? ?Discussed with patient the importance of continued conversation with family and the medical providers regarding overall plan of care and treatment options, ensuring decisions are within the context of the patient?s values and GOCs.   ? ?Questions and concerns were addressed. The patient/family was encouraged to call with questions and/or concerns. PMT card was provided. ? ? ?Primary Decision Maker: ?PATIENT ?  ? ?SUMMARY OF RECOMMENDATIONS   ?Continue full scope treatment with watchful waiting. In the event of a decline, patient is open to escalating care as medically recommended ?Continue DNR/DNI as previously documented ?Ongoing GOC pending clinical course ?Chaplain consulted for: prayer/emotional support and HCPOA completion naming daughter/Amy [name]  ?PMT will continue to follow and support holistically ? ? ?Code Status/Advance Care Planning: ?DNR ? ?Palliative Prophylaxis:  ?Aspiration, Bowel Regimen, Delirium Protocol, Frequent Pain Assessment, Oral Care, and Turn Reposition ? ?Additional Recommendations (Limitations, Scope, Preferences): ?Full Scope Treatment ? ?Psycho-social/Spiritual:  ?Desire for further Chaplaincy support:yes ?Created space and opportunity for patient to express thoughts and feelings regarding patient's current medical situation.  ?Emotional support and therapeutic listening provided. ? ?Prognosis:  ?Unable to determine ? ?Discharge Planning: To Be Determined  ? ?  ? ?Primary Diagnoses: ?Present on Admission: ? STEMI (ST elevation myocardial infarction) ([place]) ? AKI (acute kidney injury) ([place]) ? Chronic combined systolic and diastolic heart failure ([place]) ? Dyslipidemia ? Prediabetes ? Obesity (BMI 30-39.9) ? ? ?I have reviewed the medical record, interviewed the patient and family, and examined the patient. The following aspects are pertinent. ? ?Past Medical History:  ?Diagnosis  Date  ? GERD (gastroesophageal reflux disease)   ? Hyperlipidemia   ? Obesity   ? Pre-diabetes   ? ?Social History  ? ?Socioeconomic History  ? Marital status: Married  ?  Spouse name: Not on file  ? Number of children: Not on file  ? Years of education: Not on file  ? Highest education level: Not on file  ?Occupational History  ? Occupation: retired  ?Tobacco Use  ? Smoking status: Never  ? Smokeless tobacco: Never  ?Substance and Sexual Activity  ? Alcohol use: No  ? Drug use: No  ? Sexual activity: Not on file  ?Other Topics Concern  ? Not on file  ?Social History Narrative  ? Not on file  ? ?Social Determinants of Health  ? ?Financial Resource Strain: Not on file  ?Food Insecurity: Not on file  ?Transportation Needs: Not on file  ?Physical Activity: Not on file  ?Stress: Not on file  ?Social Connections: Not on file  ? ?Family History  ?Problem Relation Age of Onset  ? CAD Neg Hx   ? ?Scheduled Meds: ? aspirin  324 mg Oral Once  ? [START ON 02/27/2022] aspirin EC  81 mg Oral Daily  ? atorvastatin  80 mg Oral Daily  ? insulin aspart  0-15 Units Subcutaneous TID WC  ? sodium chloride flush  3 mL Intravenous Q12H  ? ?Continuous Infusions: ? sodium chloride    ? heparin 800 Units/hr (02/26/22 0225)  ? lactated ringers 100 mL/hr at 02/26/22 1013  ? ?PRN Meds:.sodium chloride, acetaminophen, nitroGLYCERIN, ondansetron (ZOFRAN) IV, perflutren lipid microspheres (DEFINITY) IV suspension, Polyethyl Glycol-Propyl Glycol, sodium chloride flush ?Medications Prior  to Admission:  ?Prior to Admission medications   ?Medication Sig Start Date End Date Taking? Authorizing Provider  ?acetaminophen (TYLENOL) 500 MG tablet Take 500 mg by mouth every 6 (six) hours as needed for mild pain or headache.   Yes [provider]  ?ascorbic acid (VITAMIN C) 500 MG tablet Take 500 mg by mouth daily.   Yes [provider]  ?aspirin 81 MG EC tablet Take 81 mg by mouth every evening.   Yes [provider]  ?atorvastatin  (LIPITOR) 80 MG tablet Take 1 tablet (80 mg total) by mouth daily. 10/24/21  Yes Patrecia Pour, MD  ?CALCIUM PO Take 1 tablet by mouth daily.   Yes [provider]  ?Carboxymethylcellulose Sod

## 2022-02-26 NOTE — Consult Note (Addendum)
?Cardiology Consultation:  ? ?Patient ID: Amy [name] ?MRN: [id number]; DOB: 31-Aug-1946 ? ?Admit date: 02/26/2022 ?Date of Consult: 02/26/2022 ? ?PCP:  Shirline Frees, MD ?  ?[facility] Providers ?Cardiologist:  Buford Dresser, MD   { ? ? ?Patient Profile:  ? ?Amy [name] is a 76 y.o. female with a hx of HLD, morbid obesity, GERD, VHD (mod AS), CM (?ischemic), chronic CHF who is being seen 02/26/2022 for the evaluation of 2:1 block at the request of Dr. Marisue Ivan. ? ?History of Present Illness:  ? ?Amy [name] was found to have during a COVID hospitalization Dec 2022 CHF/CM and AS prompting LHC with: ?  Prox RCA lesion is 65% stenosed. ?  Prox LAD to Mid LAD lesion is 65% stenosed. ?  1st Mrg lesion is 90% stenosed. ?  There is mild to moderate left ventricular systolic dysfunction. ?  LV end diastolic pressure is normal. ?  LV end diastolic pressure is normal. ?  The left ventricular ejection fraction is 35-45% by visual estimate. ?  Hemodynamic findings consistent with pulmonary hypertension. ?  There is moderate aortic valve stenosis. ? ?Normal Pulm/wedge pressures and treated medically, with mention, could consider PCI for symptomatic progression of coronary disease. ? ?Out pt TTE with  improved LVEF 50-55%, mod AS ? ?She was admitted yesterday with a few days of nondescript symptoms, mostly SOB, found with new ST elevation and Q waves in the inferolateral leads. 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 ? ?LABS ?K+ 4.0 ?Mag 3.4 ?BUN/Creat 65/2.01 > 2.10 (baseline looks generally <1.0) ?HS Trop 905.5 ?WBC 12 ?H/H 12/36 ?Plts 199 ?TSH 2.833 ? ?Resp panel negative ? ?She is feeling better today, much less SOB, denies any CP, no syncope ? HR is 70's 1:1 conduction ?BP looks better ? ?Echo at bedside ? ?Past Medical History:  ?Diagnosis Date  ?  GERD (gastroesophageal reflux disease)   ? Hyperlipidemia   ? Obesity   ? Pre-diabetes   ? ? ?Past Surgical History:  ?Procedure Laterality Date  ? CARPAL TUNNEL RELEASE Bilateral   ? FEMUR FRACTURE SURGERY    ? REPLACEMENT TOTAL KNEE BILATERAL    ? RIGHT/LEFT HEART CATH AND CORONARY ANGIOGRAPHY N/A 10/22/2021  ? Procedure: RIGHT/LEFT HEART CATH AND CORONARY ANGIOGRAPHY;  Surgeon: Belva Crome, MD;  Location: [facility];  Service: Cardiovascular;  Laterality: N/A;  ? SHOULDER SURGERY Right   ? rotator cuff  ?  ? ?Home Medications:  ?Prior to Admission medications   ?Medication Sig Start Date End Date Taking? Authorizing Provider  ?acetaminophen (TYLENOL) 500 MG tablet Take 500 mg by mouth every 6 (six) hours as needed for mild pain or headache.   Yes [provider]  ?ascorbic acid (VITAMIN C) 500 MG tablet Take 500 mg by mouth daily.   Yes [provider]  ?aspirin 81 MG EC tablet Take 81 mg by mouth every evening.   Yes [provider]  ?atorvastatin (LIPITOR) 80 MG tablet Take 1 tablet (80 mg total) by mouth daily. 10/24/21  Yes Patrecia Pour, MD  ?CALCIUM PO Take 1 tablet by mouth daily.   Yes [provider]  ?Carboxymethylcellulose Sodium (DRY EYE RELIEF OP) Place 1 drop into both eyes 4 (four) times daily as needed (dryness).   Yes [provider]  ?cholecalciferol (VITAMIN D3) 25 MCG (1000 UNIT) tablet Take 1,000 Units by mouth daily.   Yes [provider]  ?CHROMIUM PO Take 1 capsule by mouth daily.   Yes [provider]  ?Cinnamon 500 MG capsule Take 500 mg by mouth daily.   Yes [provider]  ?Coenzyme Q10 (CO Q-10) 200 MG CAPS Take 200 mg by mouth daily.   Yes [provider]  ?Cyanocobalamin (VITAMIN B 12 PO) Take 1 tablet by mouth daily.   Yes [provider]  ?Flaxseed, Linseed, (FLAX SEED OIL) 1000 MG CAPS Take 1,000 mg by mouth daily.   Yes [provider]  ?[name] 500 MG CAPS Take 500 mg by  mouth daily.   Yes [provider]  ?levOCARNitine (L-CARNITINE PO) Take 1 tablet by mouth daily.   Yes [provider]  ?losartan (COZAAR) 25 MG tablet Take 1 tablet (25 mg total) by mouth daily. 10/23/21  Yes Patrecia Pour, MD  ?meclizine (ANTIVERT) 25 MG tablet Take 25 mg by mouth daily. 09/12/21  Yes [provider]  ?Methylsulfonylmethane (MSM PO) Take 1 capsule by mouth daily.   Yes [provider]  ?metoprolol succinate (TOPROL-XL) 25 MG 24 hr tablet Take 1 tablet (25 mg total) by mouth daily. 12/11/21  Yes Buford Dresser, MD  ?Multiple Vitamin (MULTI-VITAMIN) tablet Take 1 tablet by mouth daily.   Yes [provider]  ?Multiple Vitamin (STRESS B PO) Take 1 tablet by mouth daily.   Yes [provider]  ?Omega-3 Fatty Acids (FISH OIL) 1000 MG CAPS Take 1,000 mg by mouth daily.   Yes [provider]  ?Pramoxine-Dimethicone (GOLD BOND INTENSIVE HEALING EX) Apply 1 application topically daily as needed (eczema).   Yes [provider]  ?torsemide (DEMADEX) 20 MG tablet Take 1 tablet (20 mg total) by mouth daily. 10/24/21  Yes Patrecia Pour, MD  ?vitamin E 1000 UNIT capsule Take 1,000 Units by mouth daily.   Yes [provider]  ?Zinc 25 MG TABS Take 25 mg by mouth daily at 12 noon.   Yes [provider]  ? ? ?Inpatient Medications: ?Scheduled Meds: ? aspirin  324 mg Oral Once  ? atorvastatin  80 mg Oral Daily  ? sodium chloride flush  3 mL Intravenous Q12H  ? ?Continuous Infusions: ? sodium chloride    ? heparin 800 Units/hr (02/26/22 0225)  ? lactated ringers 100 mL/hr at 02/26/22 1013  ? ?PRN Meds: ?sodium chloride, acetaminophen, nitroGLYCERIN, ondansetron (ZOFRAN) IV, Polyethyl Glycol-Propyl Glycol, sodium chloride flush ? ?Allergies:    ?Allergies  ?Allergen Reactions  ? Timolol Shortness Of Breath  ? Codeine Other (See Comments)  ?  sick  ? Morphine And Related Nausea And Vomiting  ? Other Other (See Comments)  ?   vicryl sutures - rash/redness, delay in healing  ? ? ?Social History:   ?Social History  ? ?Socioeconomic History  ? Marital status: Married  ?  Spouse name: Not on file  ? Number of children: Not on file  ? Years of education: Not on file  ? Highest education level: Not on file  ?Occupational History  ? Occupation: retired  ?Tobacco Use  ? Smoking status: Never  ? Smokeless tobacco: Never  ?Substance and Sexual Activity  ? Alcohol use: No  ? Drug use: No  ? Sexual activity: Not on file  ?Other Topics Concern  ? Not on file  ?Social History Narrative  ? Not on file  ? ?  Social Determinants of Health  ? ?Financial Resource Strain: Not on file  ?Food Insecurity: Not on file  ?Transportation Needs: Not on file  ?Physical Activity: Not on file  ?Stress: Not on file  ?Social Connections: Not on file  ?Intimate Partner Violence: Not on file  ?  ?Family History:   ?Family History  ?Problem Relation Age of Onset  ? CAD Neg Hx   ?  ? ?ROS:  ?Please see the history of present illness.  ?All other ROS reviewed and negative.    ? ?Physical Exam/Data:  ? ?Vitals:  ? 02/26/22 0530 02/26/22 0636 02/26/22 0700 02/26/22 0900  ?BP: 115/89 125/88 93/75 125/74  ?Pulse: 86 70 (!) 55 72  ?Resp: '18 14 20 '$ (!) 25  ?Temp:      ?TempSrc:      ?SpO2: 97% 99% 100% 100%  ? ?No intake or output data in the 24 hours ending 02/26/22 1101 ? ?  12/11/2021  ?  2:20 PM 10/23/2021  ?  5:00 AM 10/22/2021  ?  4:32 AM  ?Last 3 Weights  ?Weight (lbs) 184 lb 9.6 oz 184 lb 4.9 oz 183 lb 10.3 oz  ?Weight (kg) 83.734 kg 83.6 kg 83.3 kg  ?   ?There is no height or weight on file to calculate BMI.  ?General:  Well nourished, well developed, in no acute distress ?HEENT: normal ?Neck: no JVD ?Vascular: No carotid bruits; Distal pulses 2+ bilaterally ?Cardiac:  RRR; 2/6 SM ?Lungs:  diminished at the bases, no wheezing, rhonchi or rales  ?Abd: soft, nontender, obese ?Ext: trace edema ?Musculoskeletal:  No deformities ?Skin: warm and dry  ?Neuro:  no focal abnormalities  noted ?Psych:  Normal affect  ? ?EKG:  The EKG was personally reviewed and demonstrates:   ? ?2:1 AVblock, RBBB, 41bpm ?2:1 AVblock  RBBB, 39bpm ?SR , Mobitz one, 72bpm, RBBB ? ?OLD ?10/14/21: SR 83bpm

## 2022-02-26 NOTE — ED Triage Notes (Signed)
Pt BIB [facility] from home, c/o diarrhea yesterday, shortness of breath and generalized weakness, fever and chills today. EMS noted pt HR in the 40s, and hypotensive. SpO2 80s on room air, 2L [place] applied with improvement.  ?

## 2022-02-26 NOTE — Progress Notes (Signed)
Brief Palliative Medicine Progress Note: ? ?PMT consult received and chart reviewed. GOC completed with patient - full note to follow. ? ?SUMMARY OF RECOMMENDATIONS   ?Continue full scope treatment with watchful waiting. In the event of a decline, patient is open to escalating care as medically recommended ?Continue DNR/DNI as previously documented ?Ongoing GOC pending clinical course ?Chaplain consulted for: prayer/emotional support and HCPOA completion naming daughter/Nadine Beauchamp  ?PMT will continue to follow and support holistically ? ? ?Thank you for allowing PMT to assist in the care of this patient. ? ?[employer] Tamala Julian, FNP-BC ?Palliative Medicine Team ?Team Phone: [phone] ?NO CHARGE ? ?

## 2022-02-26 NOTE — Progress Notes (Addendum)
Echocardiogram consistent with RV infarction. LVEF 30-35%. RV is severely dilated with severely reduced function.  Also has inferolateral akinesis.  This is consistent with her late presenting inferior STEMI.  I discussed this with the patient and her daughter.  The patient is currently DNR.  We discussed that she is not a candidate for invasive angiography currently given AKI.  Blood pressures have normalized.  We have started intravenous fluids for RV infarct.  I did discuss PICC line placement with possible inotropic support.  This is all I can offer her.  She really is not a good candidate for mechanical support.  Limited advanced therapy options.  We will order a PICC line. I will start low dose fixed dobutamine (can be used on progressive unit) 2.5 mcg/kg/min. Again, this is a fixed dose, no titration. We will see how she does over the next 24 hours.  We will continue to hold any AV nodal agents or antihypertensives. ? ?[place] T. Audie Box, MD, [facility] ?[place]  ?[address], [address] ?[place], [place] [address] ?[phone]  ?2:58 PM ? ?

## 2022-02-26 NOTE — ED Notes (Signed)
Pt assisted to seated position to eat lunch.  ?Pt placed on purwick.  ? ?Pt continues to deny pain and shortness of breath at this time  ?Pt has even and non-labored respirations. NAD ?Call light within reach  ?

## 2022-02-26 NOTE — ED Provider Notes (Signed)
?[facility] ?Provider Note ? ? ?CSN: [id number] ?Arrival date & time: 02/26/22  0022 ? ?  ? ?History ? ?Chief Complaint  ?Patient presents with  ? Shortness of Breath  ? ? ?Amy [name] is a 76 y.o. female. ? ?The history is provided by the patient, the EMS personnel and medical records.  ?Shortness of Breath ?Amy [name] is a 76 y.o. female who presents to the Emergency Department complaining of sob, weakness.  She presents to the ED by EMS from home for evaluation of weakness and sob.  Started feeling poorly on Friday with nausea, generalized weakness and diarrhea.  Two days ago she developed worsening generalized weakness as well as intermittent right sided back pain.  EMS was called tonight due to worsening sob and generalized weakness.  EMS report HR 40s, BP 90s and sats of 80s on room air.  Pt reports pain resolved and feeling improved at time of ED presentation.   ?  ? ?Home Medications ?Prior to Admission medications   ?Medication Sig Start Date End Date Taking? Authorizing Provider  ?acetaminophen (TYLENOL) 500 MG tablet Take 500 mg by mouth every 6 (six) hours as needed for mild pain or headache.    [provider]  ?ascorbic acid (VITAMIN C) 500 MG tablet Take by mouth.    [provider]  ?aspirin 81 MG EC tablet Take 81 mg by mouth daily.    [provider]  ?atorvastatin (LIPITOR) 80 MG tablet Take 1 tablet (80 mg total) by mouth daily. 10/24/21   Patrecia Pour, MD  ?B Complex-C (B-COMPLEX WITH VITAMIN C) tablet Take 1 tablet by mouth daily.    [provider]  ?Carboxymethylcellulose Sodium (DRY EYE RELIEF OP) Apply 1 drop to eye 4 (four) times daily as needed (dryness).    [provider]  ?cholecalciferol (VITAMIN D3) 25 MCG (1000 UNIT) tablet 1 tablet    [provider]  ?Cinnamon 500 MG capsule     [provider]  ?Coenzyme Q10 (CO Q-10) 200 MG CAPS 1 capsule with a meal    [provider]  ?Cyanocobalamin (VITAMIN B 12 PO) Take 1 tablet by mouth daily.    [provider]  ?Flaxseed, Linseed, (FLAX SEED OIL) 1000 MG CAPS     [provider]  ?Ginger 500 MG CAPS     [provider]  ?losartan (COZAAR) 25 MG tablet Take 1 tablet (25 mg total) by mouth daily. 10/23/21   Patrecia Pour, MD  ?meclizine (ANTIVERT) 25 MG tablet Take 25 mg by mouth daily as needed for dizziness. 09/12/21   [provider]  ?metoprolol succinate (TOPROL-XL) 25 MG 24 hr tablet Take 1 tablet (25 mg total) by mouth daily. 12/11/21   Buford Dresser, MD  ?Multiple Vitamin (MULTI-VITAMIN) tablet Take 1 tablet by mouth daily.    [provider]  ?Omega-3 Fatty Acids (FISH OIL) 1000 MG CAPS 1 capsule    [provider]  ?Pramoxine-Dimethicone (GOLD BOND INTENSIVE HEALING EX) Apply 1 application topically daily as needed (eczema).    [provider]  ?torsemide (DEMADEX) 20 MG tablet Take 1 tablet (20 mg total) by mouth daily. 10/24/21   Patrecia Pour, MD  ?vitamin E 1000 UNIT capsule Take by mouth.    [provider]  ?Zinc 25 MG TABS Take 1 tablet by mouth daily at 12 noon.    [provider]  ?   ? ?Allergies    ?  Codeine, Morphine and related, Morphine sulfate, and Timolol   ? ?Review of Systems   ?Review of Systems  ?Respiratory:  Positive for shortness of breath.   ?All other systems reviewed and are negative. ? ?Physical Exam ?Updated Vital Signs ?BP 115/89   Pulse 86   Temp 97.8 ?F (36.6 ?C) (Oral)   Resp 18   SpO2 97%  ?Physical Exam ?Vitals and nursing note reviewed.  ?Constitutional:   ?   Appearance: She is well-developed.  ?HENT:  ?   Head: Normocephalic and atraumatic.  ?Cardiovascular:  ?   Rate and Rhythm: Regular rhythm. Bradycardia present.  ?   Heart sounds: No murmur heard. ?Pulmonary:  ?   Effort: Pulmonary effort is normal. No respiratory distress.  ?   Comments: Bibasilar crackles ?Abdominal:  ?   Palpations:  Abdomen is soft.  ?   Tenderness: There is no abdominal tenderness. There is no guarding or rebound.  ?Musculoskeletal:     ?   General: No swelling or tenderness.  ?Skin: ?   General: Skin is dry.  ?   Comments: Cool extremities  ?Neurological:  ?   Mental Status: She is alert and oriented to person, place, and time.  ?Psychiatric:     ?   Behavior: Behavior normal.  ? ? ?ED Results / Procedures / Treatments   ?Labs ?(all labs ordered are listed, but only abnormal results are displayed) ?Labs Reviewed  ?COMPREHENSIVE METABOLIC PANEL - Abnormal; Notable for the following components:  ?    Result Value  ? Sodium 132 (*)   ? Chloride 93 (*)   ? Glucose, Bld 150 (*)   ? BUN 65 (*)   ? Creatinine, Ser 2.01 (*)   ? Albumin 3.4 (*)   ? AST 140 (*)   ? ALT 95 (*)   ? GFR, Estimated 26 (*)   ? Anion gap 17 (*)   ? All other components within normal limits  ?CBC WITH DIFFERENTIAL/PLATELET - Abnormal; Notable for the following components:  ? WBC 12.0 (*)   ? RBC 3.61 (*)   ? Hemoglobin 11.8 (*)   ? HCT 35.8 (*)   ? Neutro Abs 7.9 (*)   ? Abs Immature Granulocytes 0.21 (*)   ? All other components within normal limits  ?BRAIN NATRIURETIC PEPTIDE - Abnormal; Notable for the following components:  ? B Natriuretic Peptide 905.5 (*)   ? All other components within normal limits  ?MAGNESIUM - Abnormal; Notable for the following components:  ? Magnesium 3.4 (*)   ? All other components within normal limits  ?I-STAT CHEM 8, ED - Abnormal; Notable for the following components:  ? Sodium 132 (*)   ? Chloride 95 (*)   ? BUN 67 (*)   ? Creatinine, Ser 2.10 (*)   ? Glucose, Bld 143 (*)   ? Calcium, Ion 1.02 (*)   ? All other components within normal limits  ?RESP PANEL BY RT-PCR (FLU A&B, COVID) ARPGX2  ?TSH  ?HEPARIN LEVEL (UNFRACTIONATED)  ?TROPONIN I (HIGH SENSITIVITY)  ?TROPONIN I (HIGH SENSITIVITY)  ? ? ?EKG ?EKG Interpretation ? ?Date/Time:  Wednesday February 26 2022 05:23:53 EDT ?Ventricular Rate:  72 ?PR Interval:  238 ?QRS  Duration: 141 ?QT Interval:  454 ?QTC Calculation: 497 ?R Axis:   -33 ?Text Interpretation: Sinus rhythm Second deg AVB, Mobitz I (Wenckebach) Right bundle branch block When compared with ECG of EARLIER SAME DATE HEART RATE has increased Confirmed by Delora Fuel ([id number]) on 02/26/2022  5:35:46 AM ? ?Radiology ?DG Chest Port 1 View ? ?Result Date: 02/26/2022 ?CLINICAL DATA:  Shortness of breath EXAM: PORTABLE CHEST 1 VIEW COMPARISON:  10/14/2021 FINDINGS: Cardiac shadow is enlarged but stable. Lungs are clear bilaterally. Degenerative changes of the thoracic spine are noted. Postsurgical changes in the right shoulder are noted. IMPRESSION: No acute abnormality noted. Electronically Signed   By: Inez Catalina M.D.   On: 02/26/2022 01:25   ? ?Procedures ?Procedures  ? ?CRITICAL CARE ?Performed by: Quintella Reichert ? ? ?Total critical care time: 60 minutes ? ?Critical care time was exclusive of separately billable procedures and treating other patients. ? ?Critical care was necessary to treat or prevent imminent or life-threatening deterioration. ? ?Critical care was time spent personally by me on the following activities: development of treatment plan with patient and/or surrogate as well as nursing, discussions with consultants, evaluation of patient's response to treatment, examination of patient, obtaining history from patient or surrogate, ordering and performing treatments and interventions, ordering and review of laboratory studies, ordering and review of radiographic studies, pulse oximetry and re-evaluation of patient's condition. ? ?Medications Ordered in ED ?Medications  ?heparin ADULT infusion 100 units/mL (25000 units/270m) (800 Units/hr Intravenous New Bag/Given 02/26/22 0225)  ?aspirin chewable tablet 324 mg (has no administration in time range)  ?sodium chloride 0.9 % bolus 500 mL (0 mLs Intravenous Stopped 02/26/22 0237)  ?heparin bolus via infusion 3,000 Units (3,000 Units Intravenous Bolus from Bag 02/26/22  0225)  ? ? ?ED Course/ Medical Decision Making/ A&P ?  ?                        ?Medical Decision Making ?Amount and/or Complexity of Data Reviewed ?Labs: ordered. ?Radiology: ordered. ? ?Risk ?Prescription drug management.

## 2022-02-26 NOTE — Progress Notes (Signed)
ANTICOAGULATION CONSULT NOTE - Initial Consult ? ?Pharmacy Consult for Heparin  ?Indication: chest pain/ACS ? ?Allergies  ?Allergen Reactions  ? Codeine   ?  Other reaction(s): sick  ? Morphine And Related Nausea And Vomiting  ? Morphine Sulfate   ?  Other reaction(s): vomiting  ? Timolol   ?  Other reaction(s): SOB ?Other reaction(s): SOB  ? ? ?Vital Signs: ?Temp: 97.8 ?F (36.6 ?C) (04/12 0025) ?Temp Source: Oral (04/12 0025) ?BP: 101/46 (04/12 0300) ?Pulse Rate: 40 (04/12 0300) ? ?Labs: ?Recent Labs  ?  02/26/22 ?0050 02/26/22 ?3662  ?HGB 11.8* 12.2  ?HCT 35.8* 36.0  ?PLT 199  --   ?CREATININE 2.01* 2.10*  ? ? ?CrCl cannot be calculated (Unknown ideal weight.). ? ? ?Medical History: ?Past Medical History:  ?Diagnosis Date  ? GERD (gastroesophageal reflux disease)   ? Hyperlipidemia   ? Obesity   ? Pre-diabetes   ? ? ? ?Assessment: ?76 y/o F with back pain and weakness to start heparin for r/o ACS. CBC good, noted renal dysfunction. PTA meds reviewed.  ? ?Goal of Therapy:  ?Heparin level 0.3-0.7 units/ml ?Monitor platelets by anticoagulation protocol: Yes ?  ?Plan:  ?Heparin 3000 units BOLUS ?Start heparin drip at 800 units/hr ?1000 Heparin level ?Daily CBC/Heparin level ?Monitor for bleeding ? ?Narda Bonds, PharmD, BCPS ?Clinical Pharmacist ?Phone: [phone] ? ? ? ? ?

## 2022-02-26 NOTE — ED Notes (Signed)
Cardiology paged to verify dobutamine rate. Returned to minimum rate. No titration needed. ?

## 2022-02-26 NOTE — Progress Notes (Signed)
Interventional cardiology: ? ?Called by EDP to discuss patient. She has a history of moderate CAD by cath in December 2022. Presented tonight with constellation of complaints including back pain starting 2 days ago, weakness, SOB, diarrhea.  ?Troponin is elevated > 24K. She has AKI which is new ?AST and ALT also elevated. ? ?Ecg is reviewed - she is in 2:1 AV block with HR in 40s. She has new ST elevation and Q waves in the inferolateral leads. Old RBBB. ? ?I suspect based on her history she had a completed inferior infarct in the last 24-48 hours. She is currently having no pain. Given her AKI I would not recommend emergent cardiac cath especially since she likely has already completed her infarct. Need to hold beta blocker. If AV block worsens would consider for temporary pacemaker.  ? ?Deborah Dondero [place] MD, [facility] ? ? ?

## 2022-02-27 ENCOUNTER — Other Ambulatory Visit (HOSPITAL_COMMUNITY): Payer: Self-pay

## 2022-02-27 DIAGNOSIS — I5042 Chronic combined systolic (congestive) and diastolic (congestive) heart failure: Secondary | ICD-10-CM | POA: Diagnosis not present

## 2022-02-27 DIAGNOSIS — I441 Atrioventricular block, second degree: Secondary | ICD-10-CM | POA: Diagnosis not present

## 2022-02-27 DIAGNOSIS — N179 Acute kidney failure, unspecified: Secondary | ICD-10-CM | POA: Diagnosis not present

## 2022-02-27 DIAGNOSIS — I249 Acute ischemic heart disease, unspecified: Secondary | ICD-10-CM | POA: Diagnosis not present

## 2022-02-27 DIAGNOSIS — I213 ST elevation (STEMI) myocardial infarction of unspecified site: Secondary | ICD-10-CM | POA: Diagnosis not present

## 2022-02-27 DIAGNOSIS — L8990 Pressure ulcer of unspecified site, unspecified stage: Secondary | ICD-10-CM | POA: Insufficient documentation

## 2022-02-27 LAB — HEPARIN LEVEL (UNFRACTIONATED)
Heparin Unfractionated: 0.16 IU/mL — ABNORMAL LOW (ref 0.30–0.70)
Heparin Unfractionated: 0.27 IU/mL — ABNORMAL LOW (ref 0.30–0.70)

## 2022-02-27 LAB — CBC
HCT: 29.1 % — ABNORMAL LOW (ref 36.0–46.0)
HCT: 28.9 % — ABNORMAL LOW (ref 36.0–46.0)
Hemoglobin: 9.9 g/dL — ABNORMAL LOW (ref 12.0–15.0)
Hemoglobin: 9.9 g/dL — ABNORMAL LOW (ref 12.0–15.0)
MCH: 32.7 pg (ref 26.0–34.0)
MCH: 33.2 pg (ref 26.0–34.0)
MCHC: 34 g/dL (ref 30.0–36.0)
MCHC: 34.3 g/dL (ref 30.0–36.0)
MCV: 96 fL (ref 80.0–100.0)
MCV: 97 fL (ref 80.0–100.0)
Platelets: 200 10*3/uL (ref 150–400)
Platelets: 199 10*3/uL (ref 150–400)
RBC: 3.03 MIL/uL — ABNORMAL LOW (ref 3.87–5.11)
RBC: 2.98 MIL/uL — ABNORMAL LOW (ref 3.87–5.11)
RDW: 13.8 % (ref 11.5–15.5)
RDW: 13.8 % (ref 11.5–15.5)
WBC: 11.4 10*3/uL — ABNORMAL HIGH (ref 4.0–10.5)
WBC: 11.4 10*3/uL — ABNORMAL HIGH (ref 4.0–10.5)
nRBC: 0 % (ref 0.0–0.2)
nRBC: 0 % (ref 0.0–0.2)

## 2022-02-27 LAB — HEPATIC FUNCTION PANEL
ALT: 236 U/L — ABNORMAL HIGH (ref 0–44)
AST: 130 U/L — ABNORMAL HIGH (ref 15–41)
Albumin: 2.8 g/dL — ABNORMAL LOW (ref 3.5–5.0)
Alkaline Phosphatase: 44 U/L (ref 38–126)
Bilirubin, Direct: <0.1 mg/dL (ref 0.0–0.2)
Total Bilirubin: 0.6 mg/dL (ref 0.3–1.2)
Total Protein: 6.5 g/dL (ref 6.5–8.1)

## 2022-02-27 LAB — BASIC METABOLIC PANEL
Anion gap: 10 (ref 5–15)
BUN: 56 mg/dL — ABNORMAL HIGH (ref 8–23)
CO2: 23 mmol/L (ref 22–32)
Calcium: 8.1 mg/dL — ABNORMAL LOW (ref 8.9–10.3)
Chloride: 98 mmol/L (ref 98–111)
Creatinine, Ser: 1.24 mg/dL — ABNORMAL HIGH (ref 0.44–1.00)
GFR, Estimated: 46 mL/min — ABNORMAL LOW (ref >60)
Glucose, Bld: 125 mg/dL — ABNORMAL HIGH (ref 70–99)
Potassium: 3.5 mmol/L (ref 3.5–5.1)
Sodium: 131 mmol/L — ABNORMAL LOW (ref 135–145)

## 2022-02-27 LAB — COOXEMETRY PANEL
Carboxyhemoglobin: 3.2 % — ABNORMAL HIGH (ref 0.5–1.5)
Methemoglobin: 1.1 % (ref 0.0–1.5)
O2 Saturation: 68.2 %
Total hemoglobin: 10.6 g/dL — ABNORMAL LOW (ref 12.0–16.0)

## 2022-02-27 LAB — GLUCOSE, CAPILLARY
Glucose-Capillary: 119 mg/dL — ABNORMAL HIGH (ref 70–99)
Glucose-Capillary: 161 mg/dL — ABNORMAL HIGH (ref 70–99)
Glucose-Capillary: 183 mg/dL — ABNORMAL HIGH (ref 70–99)
Glucose-Capillary: 126 mg/dL — ABNORMAL HIGH (ref 70–99)

## 2022-02-27 LAB — TROPONIN I (HIGH SENSITIVITY): Troponin I (High Sensitivity): 10808 ng/L (ref <18)

## 2022-02-27 MED ORDER — ALUM & MAG HYDROXIDE-SIMETH 200-200-20 MG/5ML PO SUSP
15.0000 mL | ORAL | Status: DC | PRN
  Administered 2022-02-27 – 2022-03-04 (×15): 15 mL via ORAL
  Filled 2022-02-27 (×15): qty 30

## 2022-02-27 MED ORDER — GUAIFENESIN-DM 100-10 MG/5ML PO SYRP
5.0000 mL | ORAL_SOLUTION | ORAL | Status: DC | PRN
  Administered 2022-02-27 – 2022-03-05 (×19): 5 mL via ORAL
  Filled 2022-02-27 (×21): qty 5

## 2022-02-27 MED ORDER — POTASSIUM CHLORIDE CRYS ER 20 MEQ PO TBCR
40.0000 meq | EXTENDED_RELEASE_TABLET | Freq: Once | ORAL | Status: AC
  Administered 2022-02-27: 40 meq via ORAL
  Filled 2022-02-27: qty 2

## 2022-02-27 MED ORDER — LORATADINE 10 MG PO TABS
10.0000 mg | ORAL_TABLET | Freq: Every day | ORAL | Status: DC
  Administered 2022-02-27 – 2022-03-05 (×7): 10 mg via ORAL
  Filled 2022-02-27 (×7): qty 1

## 2022-02-27 MED ORDER — CLOPIDOGREL BISULFATE 75 MG PO TABS
75.0000 mg | ORAL_TABLET | Freq: Every day | ORAL | Status: DC
  Administered 2022-02-27 – 2022-03-05 (×7): 75 mg via ORAL
  Filled 2022-02-27 (×7): qty 1

## 2022-02-27 NOTE — Progress Notes (Incomplete Revision)
New PICC site bleeding from insertion.  IV team RN stated to monitor site and notify IV team if dressing becomes loose due to increased bleeding.  Otherwise, dressing to be changed in two days.  ?

## 2022-02-27 NOTE — Progress Notes (Signed)
Attending changed to Dr. Audie Box per Dr. Kathalene Frames request and discussion with Dr. Verlon Au. ?

## 2022-02-27 NOTE — Progress Notes (Addendum)
New PICC site bleeding from insertion.  IV team RN stated to monitor site and notify IV team if dressing becomes loose due to increased bleeding.  Otherwise, dressing to be changed in two days.  ?

## 2022-02-27 NOTE — Progress Notes (Signed)
?                                                                                                                                                     ?                                                   ?Daily Progress Note  ? ?Patient Name: Amy [name]       Date: 02/27/2022 ?DOB: 1946/01/12  Age: 76 y.o. MRN#: [id number] ?Attending Physician: Geralynn Rile, MD ?Primary Care Physician: Shirline Frees, MD ?Admit Date: 02/26/2022 ? ?Reason for Consultation/Follow-up: Establishing goals of care ? ?Subjective: ?Chart review performed. Received report from primary RN - no acute concerns. Reports patient is eating and drinking well. ? ?Went to visit patient at bedside - no family/visitors present. Patient was lying in bed awake, alert, oriented, and able to participate in conversation. No signs or non-verbal gestures of pain or discomfort noted. No respiratory distress, increased work of breathing, or secretions noted. She denies pain and endorses mild shortness of breath; she states "overall I feel a lot better." She is on 2L O2 [place]. ? ?Provided medical updates - questions answered. Patient expresses joy over her improvements. She would like to continue medical management with watchful waiting. We discussed if she continues to improve, she may be offered invasive procedures by cardiology - she is open to considering this if offered.  ? ?She expresses appreciation for visit from chaplain this morning. ? ?Therapeutic listening and emotional support provided throughout visit as she reflects on how difficult the last several months have been for her medically. ? ?All questions and concerns addressed. Encouraged to call with questions and/or concerns. PMT card previously provided. ? ?Length of Stay: 1 ? ?Current Medications: ?Scheduled Meds:  ?? aspirin  324 mg Oral Once  ?? aspirin EC  81 mg Oral Daily  ?? atorvastatin  80 mg Oral Daily  ?? Chlorhexidine Gluconate Cloth  6 each Topical Daily  ?? clopidogrel  75 mg  Oral Daily  ?? insulin aspart  0-15 Units Subcutaneous TID WC  ?? loratadine  10 mg Oral Daily  ?? sodium chloride flush  10-40 mL Intracatheter Q12H  ?? sodium chloride flush  3 mL Intravenous Q12H  ? ? ?Continuous Infusions: ?? sodium chloride    ?? DOBUTamine 5 mcg/kg/min (02/26/22 1615)  ?? heparin 1,000 Units/hr (02/27/22 0957)  ? ? ?PRN Meds: ?sodium chloride, acetaminophen, alum & mag hydroxide-simeth, guaiFENesin-dextromethorphan, nitroGLYCERIN, ondansetron (ZOFRAN) IV, Polyethyl Glycol-Propyl Glycol, sodium chloride flush, sodium chloride flush ? ?Physical Exam ?Vitals and nursing note reviewed.  ?Constitutional:   ?   General: She is not in acute  distress. ?   Appearance: She is obese.  ?Pulmonary:  ?   Effort: No respiratory distress.  ?Skin: ?   General: Skin is warm and dry.  ?Neurological:  ?   Mental Status: She is alert and oriented to person, place, and time.  ?   Motor: Weakness present.  ?Psychiatric:     ?   Attention and Perception: Attention normal.     ?   Behavior: Behavior is cooperative.     ?   Cognition and Memory: Cognition and memory normal.  ?         ? ?Vital Signs: BP 101/60 (BP Location: Left Arm)   Pulse 93   Temp 98.3 ?F (36.8 ?C) (Oral)   Resp 20   Ht '4\' 9"'$  (1.448 m)   Wt 85 kg   SpO2 97%   BMI 40.55 kg/m?  ?SpO2: SpO2: 97 % ?O2 Device: O2 Device: Nasal Cannula ?O2 Flow Rate: O2 Flow Rate (L/min): 3 L/min ? ?Intake/output summary:  ?Intake/Output Summary (Last 24 hours) at 02/27/2022 1345 ?Last data filed at 02/27/2022 9326 ?Gross per 24 hour  ?Intake 1978.47 ml  ?Output 1025 ml  ?Net 953.47 ml  ? ?LBM: Last BM Date : 02/26/22 ?Baseline Weight: Weight: 83.5 kg ?Most recent weight: Weight: 85 kg ? ?     ?Palliative Assessment/Data: PPS 50% ? ? ? ? ? ?Patient Active Problem List  ? Diagnosis Date Noted  ?? AV block, Mobitz 2 02/27/2022  ?? Pressure injury of skin 02/27/2022  ?? STEMI (ST elevation myocardial infarction) ([place]) 02/26/2022  ?? AKI (acute kidney injury) ([place])  02/26/2022  ?? Dyslipidemia 02/26/2022  ?? Prediabetes 02/26/2022  ?? Obesity (BMI 30-39.9) 02/26/2022  ?? NICM (nonischemic cardiomyopathy) ([place]) 12/11/2021  ?? Nonocclusive coronary atherosclerosis of native coronary artery 12/11/2021  ?? History of COVID-19 12/11/2021  ?? Chronic combined systolic and diastolic heart failure ([place]) 12/11/2021  ?? Nonrheumatic aortic valve stenosis 12/11/2021  ?? COVID   ?? Congestive heart failure ([place])   ?? Acute CHF (congestive heart failure) ([place]) 10/15/2021  ? ? ?Palliative Care Assessment & Plan  ? ?Patient Profile: ?76 y.o. 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 EMS found her with HR 40s, BP 90s, sats 80% on RA.  Patient was admitted on 02/26/2022 with STEMI, second-degree AV block, AKI, CHF.  ? ?Assessment: ?Principal Problem: ?  STEMI (ST elevation myocardial infarction) ([place]) ?Active Problems: ?  Chronic combined systolic and diastolic heart failure ([place]) ?  AKI (acute kidney injury) ([place]) ?  Dyslipidemia ?  Prediabetes ?  Obesity (BMI 30-39.9) ?  AV block, Mobitz 2 ?  Pressure injury of skin ? ? ?Recommendations/Plan: ?Continue full scope treatment with watchful waiting. In the event of a decline, patient is open to escalating care as medically recommended ?She is open to pursuing invasive procedures if offered ?Continue DNR/DNI as previously documented ?Ongoing GOC pending clinical course ?PMT will continue to follow peripherally. If there are any imminent needs please call the service directly ? ? ?Goals of Care and Additional Recommendations: ?Limitations on Scope of Treatment: Full Scope Treatment ? ?Code Status: ? ?  ?Code Status Orders  ?(From admission, onward)  ?  ? ? ?  ? ?  Start     Ordered  ? 02/26/22 0906  Do not attempt resuscitation (DNR)  Continuous        ?Question Answer Comment  ?In the event of cardiac or respiratory ARREST Do not call a ?code blue?   ?In the event of cardiac or respiratory ARREST Do not perform Intubation, CPR, defibrillation or ACLS   ?In the event of cardiac or respiratory ARREST Use medication by any route, position, wound care, and other measures to relive pain and suffering. May use oxygen, suction and manual treatment of airway obstruction as needed for comfort.   ?  ? 02/26/22 0907  ? ?  ?  ? ?  ? ?Code Status History   ? ? Date Active Date Inactive Code Status Order ID Comments User Context  ? 10/22/2021 1723 10/23/2021 2024 Full Code [id number]  Belva Crome, MD Inpatient  ? 10/15/2021 2040 10/22/2021 1723 Full Code [id number]  Jonnie Finner, DO Inpatient  ? ?  ? ? ?Prognosis: ? Unable to determine ? ?Discharge Planning: ?To Be Determined ? ?Care plan was discussed with primary RN, patient ? ?Thank you for allowing the Palliative Medicine Team to assist in the care of this patient. ? ? ?Total Time 35 minutes Prolonged Time Billed  no   ? ?   ?Greater than 50%  of this time was spent counseling and coordinating care related to the above assessment and plan. ? ?Lin Landsman, NP ? ?Please contact Palliative Medicine Team phone at [phone] for questions and concerns.  ? ? ?*Portions of this note are a verbal dictation therefore any spelling and/or grammatical errors are due to the "[place] [employer]" system interpretation. ? ?

## 2022-02-27 NOTE — Plan of Care (Signed)
?  Problem: Clinical Measurements: ?Goal: Will remain free from infection ?Outcome: Progressing ?Goal: Diagnostic test results will improve ?Outcome: Progressing ?Goal: Respiratory complications will improve ?Outcome: Progressing ?  ?Problem: Coping: ?Goal: Level of anxiety will decrease ?Outcome: Progressing ?  ?Problem: Elimination: ?Goal: Will not experience complications related to urinary retention ?Outcome: Progressing ?  ?Problem: Pain Managment: ?Goal: General experience of comfort will improve ?Outcome: Progressing ?  ?Problem: Safety: ?Goal: Ability to remain free from injury will improve ?Outcome: Progressing ?  ?

## 2022-02-27 NOTE — Progress Notes (Signed)
?Cardiology Progress Note  ?Patient ID: Amy [name] ?MRN: [id number] ?DOB: [birth year] ?Date of Encounter: 02/27/2022 ? ?Primary Cardiologist: Buford Dresser, MD ? ?Subjective  ? ?Chief Complaint: SOB ? ?HPI: Reports she is short of breath.  Creatinine has improved.  Conduction disease has improved.  Slowly improving. ? ?ROS:  ?All other ROS reviewed and negative. Pertinent positives noted in the HPI.    ? ?Inpatient Medications  ?Scheduled Meds: ? aspirin  324 mg Oral Once  ? aspirin EC  81 mg Oral Daily  ? atorvastatin  80 mg Oral Daily  ? Chlorhexidine Gluconate Cloth  6 each Topical Daily  ? clopidogrel  75 mg Oral Daily  ? insulin aspart  0-15 Units Subcutaneous TID WC  ? sodium chloride flush  10-40 mL Intracatheter Q12H  ? sodium chloride flush  3 mL Intravenous Q12H  ? ?Continuous Infusions: ? sodium chloride    ? DOBUTamine 5 mcg/kg/min (02/26/22 1615)  ? heparin 800 Units/hr (02/27/22 0617)  ? lactated ringers 100 mL/hr at 02/27/22 0535  ? ?PRN Meds: ?sodium chloride, acetaminophen, alum & mag hydroxide-simeth, guaiFENesin-dextromethorphan, nitroGLYCERIN, ondansetron (ZOFRAN) IV, Polyethyl Glycol-Propyl Glycol, sodium chloride flush, sodium chloride flush  ? ?Vital Signs  ? ?Vitals:  ? 02/27/22 0000 02/27/22 0019 02/27/22 0513 02/27/22 0738  ?BP:  126/65 (!) 108/55 (!) 118/98  ?Pulse:  90 90 92  ?Resp:  20 20 (!) 21  ?Temp: 97.6 ?F (36.4 ?C) 97.6 ?F (36.4 ?C) 98.2 ?F (36.8 ?C) 98.2 ?F (36.8 ?C)  ?TempSrc: Oral Oral Oral Oral  ?SpO2:  100% 98% 97%  ?Weight:   85 kg   ?Height:      ? ? ?Intake/Output Summary (Last 24 hours) at 02/27/2022 0852 ?Last data filed at 02/27/2022 0740 ?Gross per 24 hour  ?Intake 1978.47 ml  ?Output 725 ml  ?Net 1253.47 ml  ? ? ?  02/27/2022  ?  5:13 AM 02/26/2022  ?  8:51 PM 02/26/2022  ?  3:00 PM  ?Last 3 Weights  ?Weight (lbs) 187 lb 6.3 oz 184 lb 11.9 oz 184 lb  ?Weight (kg) 85 kg 83.8 kg 83.462 kg  ?   ? ?Telemetry  ?Overnight telemetry shows SR 70s, which I personally  reviewed.  ? ?ECG  ?The most recent ECG shows SR 89 bpm, ST elevation inferior leads, reciprocal changes in the anterolateral leads, right bundle branch block, which I personally reviewed.  ? ?Physical Exam  ? ?Vitals:  ? 02/27/22 0000 02/27/22 0019 02/27/22 0513 02/27/22 0738  ?BP:  126/65 (!) 108/55 (!) 118/98  ?Pulse:  90 90 92  ?Resp:  20 20 (!) 21  ?Temp: 97.6 ?F (36.4 ?C) 97.6 ?F (36.4 ?C) 98.2 ?F (36.8 ?C) 98.2 ?F (36.8 ?C)  ?TempSrc: Oral Oral Oral Oral  ?SpO2:  100% 98% 97%  ?Weight:   85 kg   ?Height:      ?  ?Intake/Output Summary (Last 24 hours) at 02/27/2022 0852 ?Last data filed at 02/27/2022 0740 ?Gross per 24 hour  ?Intake 1978.47 ml  ?Output 725 ml  ?Net 1253.47 ml  ?  ? ?  02/27/2022  ?  5:13 AM 02/26/2022  ?  8:51 PM 02/26/2022  ?  3:00 PM  ?Last 3 Weights  ?Weight (lbs) 187 lb 6.3 oz 184 lb 11.9 oz 184 lb  ?Weight (kg) 85 kg 83.8 kg 83.462 kg  ?  Body mass index is 40.55 kg/m?.  ?General: Well nourished, well developed, in no acute distress ?Head: Atraumatic, normal size  ?  Eyes: PEERLA, EOMI  ?Neck: Supple, JVD 15-17 cmH2O ?Endocrine: No thryomegaly ?Cardiac: Normal S1, S2; RRR; no murmurs, rubs, or gallops ?Lungs: Clear to auscultation bilaterally, no wheezing, rhonchi or rales  ?Abd: Soft, nontender, no hepatomegaly  ?Ext: No edema, pulses 2+ ?Musculoskeletal: No deformities, BUE and BLE strength normal and equal ?Skin: Warm and dry, no rashes   ?Neuro: Alert and oriented to person, place, time, and situation, CNII-XII grossly intact, no focal deficits  ?Psych: Normal mood and affect  ? ?Labs  ?High Sensitivity Troponin:   ?Recent Labs  ?Lab 02/27/22 ?0620  ?TROPONINIHS 81,856*  ?   ?Cardiac EnzymesNo results for input(s): TROPONINI in the last 168 hours. No results for input(s): TROPIPOC in the last 168 hours.  ?Chemistry ?Recent Labs  ?Lab 02/26/22 ?0050 02/26/22 ?3149 02/27/22 ?7026  ?NA 132* 132* 131*  ?K 4.0 4.0 3.5  ?CL 93* 95* 98  ?CO2 22  --  23  ?GLUCOSE 150* 143* 125*  ?BUN 65* 67* 56*   ?CREATININE 2.01* 2.10* 1.24*  ?CALCIUM 8.9  --  8.1*  ?PROT 7.2  --   --   ?ALBUMIN 3.4*  --   --   ?AST 140*  --   --   ?ALT 95*  --   --   ?ALKPHOS 55  --   --   ?BILITOT 0.7  --   --   ?GFRNONAA 26*  --  46*  ?ANIONGAP 17*  --  10  ?  ?Hematology ?Recent Labs  ?Lab 02/26/22 ?0050 02/26/22 ?3785 02/27/22 ?0515  ?WBC 12.0*  --  11.4*  ?RBC 3.61*  --  3.03*  ?HGB 11.8* 12.2 9.9*  ?HCT 35.8* 36.0 29.1*  ?MCV 99.2  --  96.0  ?MCH 32.7  --  32.7  ?MCHC 33.0  --  34.0  ?RDW 13.9  --  13.8  ?PLT 199  --  200  ? ?BNP ?Recent Labs  ?Lab 02/26/22 ?0050  ?BNP 905.5*  ?  ?DDimer No results for input(s): DDIMER in the last 168 hours.  ? ?Radiology  ?DG Chest Port 1 View ? ?Result Date: 02/26/2022 ?CLINICAL DATA:  Shortness of breath EXAM: PORTABLE CHEST 1 VIEW COMPARISON:  10/14/2021 FINDINGS: Cardiac shadow is enlarged but stable. Lungs are clear bilaterally. Degenerative changes of the thoracic spine are noted. Postsurgical changes in the right shoulder are noted. IMPRESSION: No acute abnormality noted. Electronically Signed   By: Inez Catalina M.D.   On: 02/26/2022 01:25  ? ?ECHOCARDIOGRAM COMPLETE ? ?Result Date: 02/26/2022 ?   ECHOCARDIOGRAM REPORT   Patient Name:   Amy [name] Date of Exam: 02/26/2022 Medical Rec #:  [id number]      Height:       58.0 in Accession #:    [id number]     Weight:       184.6 lb Date of Birth:  10/15/1946      BSA:          1.760 m? Patient Age:    76 years       BP:           93/75 mmHg Patient Gender: F              HR:           55 bpm. Exam Location:  Inpatient Procedure: 2D Echo and Intracardiac Opacification Agent Indications:     Myocardial infarction  History:         Patient has prior history of Echocardiogram examinations,  most                  recent 02/06/2022. Risk Factors:Dyslipidemia.  Sonographer:     Arlyss Gandy Referring Phys:  [id number] Jarratt Diagnosing Phys: Fransico Him MD  Sonographer Comments: Image acquisition challenging due to patient body habitus.  IMPRESSIONS  1. Left ventricular ejection fraction, by estimation, is 30 to 35%. The left ventricle has moderately decreased function. Left ventricular endocardial border not optimally defined to evaluate regional wall motion. There is moderate global LV dysfunction  but study is suggestive of inferolateral akinesis. Definity contrast study is poor quality. Left ventricular diastolic parameters are consistent with Grade I diastolic dysfunction (impaired relaxation). There is the interventricular septum is flattened in diastole ('D' shaped left ventricle), consistent with right ventricular volume overload.  2. Right ventricular systolic function is severely reduced. The right ventricular size is moderately enlarged. There is normal pulmonary artery systolic pressure. The estimated right ventricular systolic pressure is 35.0 mmHg.  3. Right atrial size was severely dilated.  4. The mitral valve is normal in structure. Trivial mitral valve regurgitation. No evidence of mitral stenosis.  5. Tricuspid valve regurgitation is moderate to severe.  6. The aortic valve is calcified. There is severe calcifcation of the aortic valve. There is moderate thickening of the aortic valve. Aortic valve regurgitation is trivial. Moderate to severe aortic valve stenosis. Aortic valve area, by VTI measures 0.85 cm?Marland Kitchen Aortic valve mean gradient measures 12.0 mmHg. Aortic valve Vmax measures 2.28 m/s.  7. The inferior vena cava is dilated in size with <50% respiratory variability, suggesting right atrial pressure of 15 mmHg.  8. The AVA is calculated at 0.85cm2 consistent with severe AS while the mean AVG is low at 47mHg and Vmax 2.25 m/s. In the setting of low DVI of 0.27 and low SVI of 23, suspect that this patient has at leaste moderate and possibly moderate to severe low flow low gradient AS in setting of HFrEF. FINDINGS  Left Ventricle: Left ventricular ejection fraction, by estimation, is 30 to 35%. The left ventricle has moderately  decreased function. Left ventricular endocardial border not optimally defined to evaluate regional wall motion. Definity contrast agent was given IV to delineate the left ventricular endocardial borders. The left v

## 2022-02-27 NOTE — Progress Notes (Signed)
ANTICOAGULATION CONSULT NOTE ? ?Pharmacy Consult for Heparin  ?Indication: chest pain/ACS ? ?Allergies  ?Allergen Reactions  ? Timolol Shortness Of Breath  ? Codeine Other (See Comments)  ?  sick  ? Morphine And Related Nausea And Vomiting  ? Other Other (See Comments)  ?  vicryl sutures - rash/redness, delay in healing  ? ? ?Vital Signs: ?Temp: 98.2 ?F (36.8 ?C) (04/13 7356) ?Temp Source: Oral (04/13 7014) ?BP: 118/98 (04/13 1030) ?Pulse Rate: 92 (04/13 0738) ? ?Labs: ?Recent Labs  ?  02/26/22 ?0050 02/26/22 ?1314 02/26/22 ?1024 02/27/22 ?3888 02/27/22 ?7579  ?HGB 11.8* 12.2  --  9.9*  --   ?HCT 35.8* 36.0  --  29.1*  --   ?PLT 199  --   --  200  --   ?HEPARINUNFRC  --   --  0.36 0.16*  --   ?CREATININE 2.01* 2.10*  --   --  1.24*  ?TROPONINIHS  --   --   --   --  10,808*  ? ? ? ?Estimated Creatinine Clearance: 35.9 mL/min (A) (by C-G formula based on SCr of 1.24 mg/dL (H)). ? ?Assessment: ?76 y/o F with back pain and weakness to start heparin for r/o ACS. Heparin level is now below goal. No bleeding noted, however did see a significant drop in hemoglobin overnight. Will recheck cbc with follow up heparin level.  ? ?Goal of Therapy:  ?Heparin level 0.3-0.7 units/ml ?Monitor platelets by anticoagulation protocol: Yes ?  ?Plan:  ?Increase heparin infusion to 1000 units/hr ?Recheck CBC/Heparin level tonight ?Monitor for bleeding ? ?Erin Hearing PharmD., BCPS ?Clinical Pharmacist ?02/27/2022 9:05 AM ? ?

## 2022-02-27 NOTE — Progress Notes (Signed)
Per Dr. Audie Box, he tentatively put patient on for possible [facility] cath tomorrow but finalized plans TBD in AM to decide. Will make NPO after midnight. ?

## 2022-02-27 NOTE — Progress Notes (Signed)
ANTICOAGULATION CONSULT NOTE ? ?Pharmacy Consult for Heparin  ?Indication: chest pain/ACS ? ?Allergies  ?Allergen Reactions  ? Timolol Shortness Of Breath  ? Codeine Other (See Comments)  ?  sick  ? Morphine And Related Nausea And Vomiting  ? Other Other (See Comments)  ?  vicryl sutures - rash/redness, delay in healing  ? ? ?Vital Signs: ?Temp: 98.1 ?F (36.7 ?C) (04/13 2103) ?Temp Source: Oral (04/13 2103) ?BP: 107/54 (04/13 2103) ?Pulse Rate: 98 (04/13 2103) ? ?Labs: ?Recent Labs  ?  02/26/22 ?0050 02/26/22 ?1062 02/26/22 ?1024 02/27/22 ?6948 02/27/22 ?0620 02/27/22 ?2000 02/27/22 ?2025  ?HGB 11.8* 12.2  --  9.9*  --  9.9*  --   ?HCT 35.8* 36.0  --  29.1*  --  28.9*  --   ?PLT 199  --   --  200  --  199  --   ?HEPARINUNFRC  --   --  0.36 0.16*  --   --  0.27*  ?CREATININE 2.01* 2.10*  --   --  1.24*  --   --   ?TROPONINIHS  --   --   --   --  10,808*  --   --   ? ? ?Estimated Creatinine Clearance: 35.9 mL/min (A) (by C-G formula based on SCr of 1.24 mg/dL (H)). ? ?Assessment: ?76 y/o F with back pain and weakness to start heparin for r/o ACS. Heparin level is now below goal. No bleeding noted, however did see a significant drop in hemoglobin overnight. Repeat CBC now stable, platelets 199. Heparin level still subtherapeutic at 0.27 on heparin 1000 units/hr. No s/sx bleeding reported. ? ?Goal of Therapy:  ?Heparin level 0.3-0.7 units/ml ?Monitor platelets by anticoagulation protocol: Yes ?  ?Plan:  ?Increase heparin infusion to 1200 units/hr ?Check heparin level in 8 hours and daily while on heparin ?Continue to monitor H&H and platelets ? ? ? ?Thank you for allowing pharmacy to be a part of this patient?s care. ? ?Ardyth Harps, PharmD ?Clinical Pharmacist ? ? ?

## 2022-02-27 NOTE — TOC Benefit Eligibility Note (Signed)
Patient Advocate Encounter ? ?Insurance verification completed.   ? ?The patient is currently admitted and upon discharge could be taking Farxiga 10 mg. ? ?The current 30 day co-pay is, $10.35.  ? ?The patient is currently admitted and upon discharge could be taking Jardiance 10 mg. ? ?The current 30 day co-pay is, $10.35.  ? ?The patient is currently admitted and upon discharge could be taking Entresto 24-26 mg. ? ?The current 30 day co-pay is, $10.35.  ? ?The patient is insured through [employer] Part D  ? ? ? ?Lyndel Safe, CPhT ?Pharmacy Patient Advocate Specialist ?[place] Patient Advocate Team ?Direct Number: [phone]  Fax: [phone] ? ? ? ? ? ?  ?

## 2022-02-27 NOTE — Clinical Note (Incomplete)
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 Shelton Silvas of cardiology was compliant with meds was taking his torsemide and metoprolol was increased to 25 daily--at that time it appeared she was doing well ?She also has several episodes of contracting coronavirus--- repeat echo 3/28 showed better pulm function in the outpatient setting ? ?Presented back from home 4/12 with weakness diarrhea fevers chills heart rate in the 40s hypotensive SPO2 in the 80s with improvement on 2 L oxygen ?EKG showed significant changes compared to prior although chest pain free-troponin >24,000 AKI in the 2 range new ST-T wave elevations and Q waves in inferolateral leads and 2-1 AV block with heart rate in the 40s-cardiology felt that she completed an infarct probably several days ago ? ?Medical management was advised in addition to aggressive goals of care counseling by the cardiologist ?EP saw the patient and it did not appear that patient was a good candidate for pacing either ? ?Patient is DNR/DNI 6 ?

## 2022-02-27 NOTE — Progress Notes (Signed)
Chaplain Maggie introduced spiritual care at bedside and initiated Advanced Directive education. Pt stated she does not think she needs to designate a HCPOA at this time. Chaplain left paperwork at bedside and encouraged pt to review document and think about it. The visit centered in storytelling and conversation about faith. Pt relies on her faith in the [place]. This disposition provides her comfort as she accepts circumstances with trust. Pt shared that she knows she is loved by God. She offered stories from her life that described the meaning of her faith in moments of crisis and transition. Pt requested prayers for healing. She noted that she appreciates when staff and doctors listen to her. Chaplain encouraged pt to know that she is the most important person in determining her needs and how they are met. Pt appreciated being reminded that her voice is the most important related to her needs and her care. She then added, "the Reita Cliche is the boss." Pt open to follow up. Chaplain requested staff chaplain to follow up with patient to determine if she would like to complete AD.  ?

## 2022-02-28 ENCOUNTER — Encounter (HOSPITAL_COMMUNITY): Payer: Self-pay | Admitting: Internal Medicine

## 2022-02-28 ENCOUNTER — Encounter (HOSPITAL_COMMUNITY)
Admission: EM | Disposition: A | Discharge status: Home Health | Payer: Self-pay | Source: Home / Self Care | Attending: Cardiovascular Disease

## 2022-02-28 ENCOUNTER — Inpatient Hospital Stay (HOSPITAL_COMMUNITY): Payer: Medicare HMO

## 2022-02-28 DIAGNOSIS — I50810 Right heart failure, unspecified: Secondary | ICD-10-CM | POA: Diagnosis not present

## 2022-02-28 DIAGNOSIS — I213 ST elevation (STEMI) myocardial infarction of unspecified site: Secondary | ICD-10-CM | POA: Diagnosis not present

## 2022-02-28 DIAGNOSIS — I441 Atrioventricular block, second degree: Secondary | ICD-10-CM | POA: Diagnosis not present

## 2022-02-28 DIAGNOSIS — I2510 Atherosclerotic heart disease of native coronary artery without angina pectoris: Secondary | ICD-10-CM

## 2022-02-28 DIAGNOSIS — I5042 Chronic combined systolic (congestive) and diastolic (congestive) heart failure: Secondary | ICD-10-CM | POA: Diagnosis not present

## 2022-02-28 DIAGNOSIS — R570 Cardiogenic shock: Secondary | ICD-10-CM | POA: Diagnosis not present

## 2022-02-28 DIAGNOSIS — I249 Acute ischemic heart disease, unspecified: Principal | ICD-10-CM

## 2022-02-28 DIAGNOSIS — K7200 Acute and subacute hepatic failure without coma: Secondary | ICD-10-CM

## 2022-02-28 DIAGNOSIS — N179 Acute kidney failure, unspecified: Secondary | ICD-10-CM | POA: Diagnosis not present

## 2022-02-28 HISTORY — PX: RIGHT/LEFT HEART CATH AND CORONARY ANGIOGRAPHY: CATH118266

## 2022-02-28 LAB — LIPID PANEL
Cholesterol: 79 mg/dL (ref 0–200)
HDL: 26 mg/dL — ABNORMAL LOW (ref >40)
LDL Cholesterol: 33 mg/dL (ref 0–99)
Total CHOL/HDL Ratio: 3 RATIO
Triglycerides: 99 mg/dL (ref <150)
VLDL: 20 mg/dL (ref 0–40)

## 2022-02-28 LAB — COMPREHENSIVE METABOLIC PANEL
ALT: 236 U/L — ABNORMAL HIGH (ref 0–44)
AST: 79 U/L — ABNORMAL HIGH (ref 15–41)
Albumin: 2.7 g/dL — ABNORMAL LOW (ref 3.5–5.0)
Alkaline Phosphatase: 58 U/L (ref 38–126)
Anion gap: 7 (ref 5–15)
BUN: 37 mg/dL — ABNORMAL HIGH (ref 8–23)
CO2: 25 mmol/L (ref 22–32)
Calcium: 8 mg/dL — ABNORMAL LOW (ref 8.9–10.3)
Chloride: 99 mmol/L (ref 98–111)
Creatinine, Ser: 0.94 mg/dL (ref 0.44–1.00)
GFR, Estimated: >60 mL/min (ref >60)
Glucose, Bld: 127 mg/dL — ABNORMAL HIGH (ref 70–99)
Potassium: 4.1 mmol/L (ref 3.5–5.1)
Sodium: 131 mmol/L — ABNORMAL LOW (ref 135–145)
Total Bilirubin: 0.7 mg/dL (ref 0.3–1.2)
Total Protein: 6.4 g/dL — ABNORMAL LOW (ref 6.5–8.1)

## 2022-02-28 LAB — HEMOGLOBIN A1C
Hgb A1c MFr Bld: 6.3 % — ABNORMAL HIGH (ref 4.8–5.6)
Mean Plasma Glucose: 134.11 mg/dL

## 2022-02-28 LAB — CBC
HCT: 28.9 % — ABNORMAL LOW (ref 36.0–46.0)
Hemoglobin: 10 g/dL — ABNORMAL LOW (ref 12.0–15.0)
MCH: 33.4 pg (ref 26.0–34.0)
MCHC: 34.6 g/dL (ref 30.0–36.0)
MCV: 96.7 fL (ref 80.0–100.0)
Platelets: 197 10*3/uL (ref 150–400)
RBC: 2.99 MIL/uL — ABNORMAL LOW (ref 3.87–5.11)
RDW: 13.9 % (ref 11.5–15.5)
WBC: 10.8 10*3/uL — ABNORMAL HIGH (ref 4.0–10.5)
nRBC: 0 % (ref 0.0–0.2)

## 2022-02-28 LAB — POCT I-STAT EG7
Acid-Base Excess: 0 mmol/L (ref 0.0–2.0)
Bicarbonate: 25.4 mmol/L (ref 20.0–28.0)
Calcium, Ion: 1.15 mmol/L (ref 1.15–1.40)
HCT: 28 % — ABNORMAL LOW (ref 36.0–46.0)
Hemoglobin: 9.5 g/dL — ABNORMAL LOW (ref 12.0–15.0)
O2 Saturation: 49 %
Potassium: 4.1 mmol/L (ref 3.5–5.1)
Sodium: 137 mmol/L (ref 135–145)
TCO2: 27 mmol/L (ref 22–32)
pCO2, Ven: 42.1 mmHg — ABNORMAL LOW (ref 44–60)
pH, Ven: 7.388 (ref 7.25–7.43)
pO2, Ven: 27 mmHg — CL (ref 32–45)

## 2022-02-28 LAB — COOXEMETRY PANEL
Carboxyhemoglobin: 2.9 % — ABNORMAL HIGH (ref 0.5–1.5)
Methemoglobin: 1 % (ref 0.0–1.5)
O2 Saturation: 62.5 %
Total hemoglobin: 10 g/dL — ABNORMAL LOW (ref 12.0–16.0)

## 2022-02-28 LAB — GLUCOSE, CAPILLARY
Glucose-Capillary: 129 mg/dL — ABNORMAL HIGH (ref 70–99)
Glucose-Capillary: 136 mg/dL — ABNORMAL HIGH (ref 70–99)
Glucose-Capillary: 161 mg/dL — ABNORMAL HIGH (ref 70–99)
Glucose-Capillary: 124 mg/dL — ABNORMAL HIGH (ref 70–99)

## 2022-02-28 LAB — HEPARIN LEVEL (UNFRACTIONATED): Heparin Unfractionated: 0.37 IU/mL (ref 0.30–0.70)

## 2022-02-28 SURGERY — RIGHT/LEFT HEART CATH AND CORONARY ANGIOGRAPHY
Anesthesia: LOCAL

## 2022-02-28 MED ORDER — POLYVINYL ALCOHOL 1.4 % OP SOLN: 1.0000 [drp] | Freq: Four times a day (QID) | OPHTHALMIC | Status: DC | PRN

## 2022-02-28 MED ORDER — HEPARIN SODIUM (PORCINE) 1000 UNIT/ML IJ SOLN
INTRAMUSCULAR | Status: AC
  Filled 2022-02-28: qty 10

## 2022-02-28 MED ORDER — HEPARIN SODIUM (PORCINE) 1000 UNIT/ML IJ SOLN
INTRAMUSCULAR | Status: DC | PRN
  Administered 2022-02-28: 4500 [IU] via INTRAVENOUS

## 2022-02-28 MED ORDER — FENTANYL CITRATE (PF) 100 MCG/2ML IJ SOLN
INTRAMUSCULAR | Status: DC | PRN
  Administered 2022-02-28 (×3): 25 ug via INTRAVENOUS

## 2022-02-28 MED ORDER — SODIUM CHLORIDE 0.9 % IV SOLN: INTRAVENOUS | Status: DC

## 2022-02-28 MED ORDER — HEPARIN (PORCINE) IN NACL 1000-0.9 UT/500ML-% IV SOLN
INTRAVENOUS | Status: DC | PRN
  Administered 2022-02-28 (×2): 500 mL

## 2022-02-28 MED ORDER — CLOPIDOGREL BISULFATE 75 MG PO TABS: 75.0000 mg | ORAL_TABLET | ORAL | Status: DC

## 2022-02-28 MED ORDER — ENOXAPARIN SODIUM 40 MG/0.4ML IJ SOSY
40.0000 mg | PREFILLED_SYRINGE | INTRAMUSCULAR | Status: DC
  Administered 2022-03-01 – 2022-03-05 (×5): 40 mg via SUBCUTANEOUS
  Filled 2022-02-28 (×5): qty 0.4

## 2022-02-28 MED ORDER — POLYETHYLENE GLYCOL 3350 17 G PO PACK
17.0000 g | PACK | Freq: Every day | ORAL | Status: DC
  Administered 2022-02-28 – 2022-03-01 (×2): 17 g via ORAL
  Filled 2022-02-28 (×6): qty 1

## 2022-02-28 MED ORDER — SODIUM CHLORIDE 0.9% FLUSH: 3.0000 mL | INTRAVENOUS | Status: DC | PRN

## 2022-02-28 MED ORDER — IOHEXOL 350 MG/ML SOLN
INTRAVENOUS | Status: DC | PRN
  Administered 2022-02-28: 80 mL

## 2022-02-28 MED ORDER — LABETALOL HCL 5 MG/ML IV SOLN: 10.0000 mg | INTRAVENOUS | Status: DC | PRN

## 2022-02-28 MED ORDER — SODIUM CHLORIDE 0.9 % IV SOLN: 250.0000 mL | INTRAVENOUS | Status: DC | PRN

## 2022-02-28 MED ORDER — VERAPAMIL HCL 2.5 MG/ML IV SOLN
INTRAVENOUS | Status: DC | PRN
  Administered 2022-02-28: 10 mL via INTRA_ARTERIAL

## 2022-02-28 MED ORDER — ONDANSETRON HCL 4 MG/2ML IJ SOLN: 4.0000 mg | Freq: Four times a day (QID) | INTRAMUSCULAR | Status: DC | PRN

## 2022-02-28 MED ORDER — HYDRALAZINE HCL 20 MG/ML IJ SOLN: 10.0000 mg | INTRAMUSCULAR | Status: AC | PRN

## 2022-02-28 MED ORDER — SODIUM CHLORIDE 0.9% FLUSH: 3.0000 mL | Freq: Two times a day (BID) | INTRAVENOUS | Status: DC

## 2022-02-28 MED ORDER — VERAPAMIL HCL 2.5 MG/ML IV SOLN
INTRAVENOUS | Status: AC
Start: 2022-02-28 — End: ?
  Filled 2022-02-28: qty 2

## 2022-02-28 MED ORDER — FENTANYL CITRATE (PF) 100 MCG/2ML IJ SOLN
INTRAMUSCULAR | Status: AC
  Filled 2022-02-28: qty 2

## 2022-02-28 MED ORDER — ASPIRIN 81 MG PO CHEW
81.0000 mg | CHEWABLE_TABLET | ORAL | Status: AC
  Administered 2022-02-28: 81 mg via ORAL
  Filled 2022-02-28: qty 1

## 2022-02-28 MED ORDER — ACETAMINOPHEN 325 MG PO TABS: 650.0000 mg | ORAL_TABLET | ORAL | Status: DC | PRN

## 2022-02-28 MED ORDER — LIDOCAINE HCL (PF) 1 % IJ SOLN
INTRAMUSCULAR | Status: DC | PRN
  Administered 2022-02-28 (×2): 2 mL

## 2022-02-28 MED ORDER — SODIUM CHLORIDE 0.9% FLUSH
3.0000 mL | Freq: Two times a day (BID) | INTRAVENOUS | Status: DC
  Administered 2022-02-28: 3 mL via INTRAVENOUS

## 2022-02-28 MED ORDER — LIDOCAINE HCL (PF) 1 % IJ SOLN
INTRAMUSCULAR | Status: AC
  Filled 2022-02-28: qty 30

## 2022-02-28 MED ORDER — HEPARIN (PORCINE) IN NACL 1000-0.9 UT/500ML-% IV SOLN
INTRAVENOUS | Status: AC
  Filled 2022-02-28: qty 1000

## 2022-02-28 SURGICAL SUPPLY — 15 items
CATH 5FR JL3.5 JR4 ANG PIG MP (CATHETERS) ×1 IMPLANT
CATH BALLN WEDGE 5F 110CM (CATHETERS) ×1 IMPLANT
CATH INFINITI 5FR AL1 (CATHETERS) ×1 IMPLANT
CATH LAUNCHER 6FR JL3 (CATHETERS) ×1 IMPLANT
DEVICE RAD COMP TR BAND LRG (VASCULAR PRODUCTS) ×1 IMPLANT
ELECT DEFIB PAD ADLT CADENCE (PAD) ×1 IMPLANT
GLIDESHEATH SLEND SS 6F .021 (SHEATH) ×1 IMPLANT
GUIDEWIRE .025 260CM (WIRE) ×1 IMPLANT
GUIDEWIRE INQWIRE 1.5J.035X260 (WIRE) IMPLANT
INQWIRE 1.5J .035X260CM (WIRE) ×2
PACK CARDIAC CATHETERIZATION (CUSTOM PROCEDURE TRAY) ×3 IMPLANT
SHEATH 6FR 75 DEST SLENDER (SHEATH) ×1 IMPLANT
SHEATH GLIDE SLENDER 4/5FR (SHEATH) ×1 IMPLANT
TRANSDUCER W/STOPCOCK (MISCELLANEOUS) ×3 IMPLANT
WIRE HI TORQ VERSACORE-J 145CM (WIRE) ×1 IMPLANT

## 2022-02-28 NOTE — Progress Notes (Incomplete)
?Cardiology Progress Note  ?Patient ID: Amy [name] ?MRN: [id number] ?DOB: August 11, 1946 ?Date of Encounter: 02/28/2022 ? ?Primary Cardiologist: Buford Dresser, MD ? ?Subjective  ? ?Chief Complaint: None.  ? ?Subjective: CVP 10 -15. +SOB. Doing welll. ? ?ROS:  ?All other ROS reviewed and negative. Pertinent positives noted in the HPI.    ? ?Inpatient Medications  ?Scheduled Meds: ? aspirin EC  81 mg Oral Daily  ? Chlorhexidine Gluconate Cloth  6 each Topical Daily  ? clopidogrel  75 mg Oral Daily  ? [START ON 03/01/2022] enoxaparin (LOVENOX) injection  40 mg Subcutaneous Q24H  ? insulin aspart  0-15 Units Subcutaneous TID WC  ? loratadine  10 mg Oral Daily  ? sodium chloride flush  10-40 mL Intracatheter Q12H  ? sodium chloride flush  3 mL Intravenous Q12H  ? sodium chloride flush  3 mL Intravenous Q12H  ? ?Continuous Infusions: ? sodium chloride    ? sodium chloride    ? DOBUTamine 2.5 mcg/kg/min (02/28/22 0743)  ? ?PRN Meds: ?sodium chloride, sodium chloride, acetaminophen, alum & mag hydroxide-simeth, guaiFENesin-dextromethorphan, hydrALAZINE, labetalol, nitroGLYCERIN, ondansetron (ZOFRAN) IV, polyvinyl alcohol, sodium chloride flush, sodium chloride flush, sodium chloride flush  ? ?Vital Signs  ? ?Vitals:  ? 02/28/22 0904 02/28/22 0956 02/28/22 1031 02/28/22 1048  ?BP:   (!) 116/49 126/64  ?Pulse:   97 92  ?Resp:   16 17  ?Temp:   97.8 ?F (36.6 ?C)   ?TempSrc:   Oral   ?SpO2: 91% 92% 92%   ?Weight:      ?Height:      ? ? ?Intake/Output Summary (Last 24 hours) at 02/28/2022 1330 ?Last data filed at 02/28/2022 0814 ?Gross per 24 hour  ?Intake 970.79 ml  ?Output 1300 ml  ?Net -329.21 ml  ? ? ? ?  02/28/2022  ?  4:40 AM 02/27/2022  ?  5:13 AM 02/26/2022  ?  8:51 PM  ?Last 3 Weights  ?Weight (lbs) 191 lb 2.2 oz 187 lb 6.3 oz 184 lb 11.9 oz  ?Weight (kg) 86.7 kg 85 kg 83.8 kg  ?   ? ?Telemetry  ?*** which I personally reviewed.  ? ?ECG  ?*** which I personally reviewed.  ? ?Physical Exam  ? ?Vitals:  ? 02/28/22 0904  02/28/22 0956 02/28/22 1031 02/28/22 1048  ?BP:   (!) 116/49 126/64  ?Pulse:   97 92  ?Resp:   16 17  ?Temp:   97.8 ?F (36.6 ?C)   ?TempSrc:   Oral   ?SpO2: 91% 92% 92%   ?Weight:      ?Height:      ?  ?Intake/Output Summary (Last 24 hours) at 02/28/2022 1330 ?Last data filed at 02/28/2022 4818 ?Gross per 24 hour  ?Intake 970.79 ml  ?Output 1300 ml  ?Net -329.21 ml  ? ?  ? ?  02/28/2022  ?  4:40 AM 02/27/2022  ?  5:13 AM 02/26/2022  ?  8:51 PM  ?Last 3 Weights  ?Weight (lbs) 191 lb 2.2 oz 187 lb 6.3 oz 184 lb 11.9 oz  ?Weight (kg) 86.7 kg 85 kg 83.8 kg  ?  Body mass index is 41.36 kg/m?.  ?General: Well nourished, well developed, in no acute distress ?Head: Atraumatic, normal size  ?Eyes: PEERLA, EOMI  ?Neck: Supple, 15-17 cmH2O JVD elevation ?Endocrine: No thryomegaly ?Cardiac: Normal S1, S2; RRR; no murmurs, rubs, or gallops ?Lungs: Diminished breath sounds bilaterally ?Abd: Soft, nontender, no hepatomegaly  ?Ext: No edema, pulses 2+ ?Musculoskeletal: No deformities,  BUE and BLE strength normal and equal ?Skin: Warm and dry, no rashes   ?Neuro: Alert and oriented to person, place, time, and situation, CNII-XII grossly intact, no focal deficits  ?Psych: Normal mood and affect  ? ?Labs  ?High Sensitivity Troponin:   ?Recent Labs  ?Lab 02/27/22 ?0620  ?TROPONINIHS 10,808*  ? ?   ?Cardiac EnzymesNo results for input(s): TROPONINI in the last 168 hours. No results for input(s): TROPIPOC in the last 168 hours.  ?Chemistry ?Recent Labs  ?Lab 02/26/22 ?0050 02/26/22 ?7867 02/27/22 ?5449 02/28/22 ?0450  ?NA 132* 132* 131* 131*  ?K 4.0 4.0 3.5 4.1  ?CL 93* 95* 98 99  ?CO2 22  --  23 25  ?GLUCOSE 150* 143* 125* 127*  ?BUN 65* 67* 56* 37*  ?CREATININE 2.01* 2.10* 1.24* 0.94  ?CALCIUM 8.9  --  8.1* 8.0*  ?PROT 7.2  --  6.5 6.4*  ?ALBUMIN 3.4*  --  2.8* 2.7*  ?AST 140*  --  130* 79*  ?ALT 95*  --  236* 236*  ?ALKPHOS 55  --  44 58  ?BILITOT 0.7  --  0.6 0.7  ?GFRNONAA 26*  --  46* >60  ?ANIONGAP 17*  --  10 7  ? ?   ?Hematology ?Recent Labs  ?Lab 02/27/22 ?2010 02/27/22 ?2000 02/28/22 ?0450  ?WBC 11.4* 11.4* 10.8*  ?RBC 3.03* 2.98* 2.99*  ?HGB 9.9* 9.9* 10.0*  ?HCT 29.1* 28.9* 28.9*  ?MCV 96.0 97.0 96.7  ?MCH 32.7 33.2 33.4  ?MCHC 34.0 34.3 34.6  ?RDW 13.8 13.8 13.9  ?PLT 200 199 197  ? ? ?BNP ?Recent Labs  ?Lab 02/26/22 ?0050  ?BNP 905.5*  ? ?  ?DDimer No results for input(s): DDIMER in the last 168 hours.  ? ?Radiology  ?CARDIAC CATHETERIZATION ? ?Result Date: 02/28/2022 ?  Ost RCA to Prox RCA lesion is 100% stenosed.   Ost LM lesion is 60% stenosed.   Ost Cx lesion is 50% stenosed.   Prox LAD to Mid LAD lesion is 60% stenosed.   Ramus lesion is 90% stenosed. Findings: On dobutamine 2.5 Ao = 83/53 (67) LV = 92/14 RA = 19 (with prominent v- waves) RV = 28/18 PA = 27/11 (19) PCW = 5 Fick cardiac output/index = 4.1/2.3 PVR = 3.4 WU FA sat = 88% PA sat = 43%, 49% PAPi = 0.8 AoV gradient peak to peak 2.75mHG on pullback Assessment: 1. 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 Borderline 60% LM lesion 3. Mild AS 4. 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 Will review LM lesion with IC team. Currently stable and no urgent need for revascularization. DGlori Bickers MD 11:11 AM ? ?[place]oreaEKG SITE RITE ? ?Result Date: 02/26/2022 ?If SOccidental Petroleumnot attached, placement could not be confirmed due to current cardiac rhythm. ? ?UKoreaAbdomen Limited RUQ (LIVER/GB) ? ?Result Date: 02/28/2022 ?CLINICAL DATA:  Elevated LFTs EXAM: ULTRASOUND ABDOMEN LIMITED RIGHT UPPER QUADRANT COMPARISON:  None. FINDINGS: Gallbladder: No gallstones or wall thickening visualized. No sonographic Murphy sign noted by sonographer. Common bile duct: Diameter: 3.3 mL Liver: No focal lesion identified. Within normal limits in parenchymal echogenicity. Portal vein is patent on color Doppler imaging with normal direction of blood flow towards the liver. Other: Trace  ascites. IMPRESSION: 1. Normal sonographic appearance of the gallbladder and liver. 2. Trace ascites. Electronically Signed   By: LYetta GlassmanM.D.   On: 02/28/2022 08:14   ? ?  Cardiac Studies  ?TTE 02/26/2022 ? 1. Left ventricular ejection fraction, by estimation, is 30 to 35%. The  ?left ventricle has moderately decreased function. Left ventricular  ?endocardial border not optimally defined to evaluate regional wall motion.  ?There is moderate global LV dysfunction  ? but study is suggestive of inferolateral akinesis. Definity contrast  ?study is poor quality. Left ventricular diastolic parameters are  ?consistent with Grade I diastolic dysfunction (impaired relaxation). There  ?is the interventricular septum is flattened  ?in diastole ('D' shaped left ventricle), consistent with right ventricular  ?volume overload.  ? 2. Right ventricular systolic function is severely reduced. The right  ?ventricular size is moderately enlarged. There is normal pulmonary artery  ?systolic pressure. The estimated right ventricular systolic pressure is  ?54.2 mmHg.  ? 3. Right atrial size was severely dilated.  ? 4. The mitral valve is normal in structure. Trivial mitral valve  ?regurgitation. No evidence of mitral stenosis.  ? 5. Tricuspid valve regurgitation is moderate to severe.  ? 6. The aortic valve is calcified. There is severe calcifcation of the  ?aortic valve. There is moderate thickening of the aortic valve. Aortic  ?valve regurgitation is trivial. Moderate to severe aortic valve stenosis.  ?Aortic valve area, by VTI measures  ?0.85 cm?Marland Kitchen Aortic valve mean gradient measures 12.0 mmHg. Aortic valve Vmax  ?measures 2.28 m/s.  ? 7. The inferior vena cava is dilated in size with <50% respiratory  ?variability, suggesting right atrial pressure of 15 mmHg.  ? 8. The AVA is calculated at 0.85cm2 consistent with severe AS while the  ?mean AVG is low at 53mHg and Vmax 2.25 m/s. In the setting of low DVI of  ?0.27 and low SVI of  23, suspect that this patient has at leaste moderate  ?and possibly moderate to severe  ?low flow low gradient AS in setting of HFrEF.  ? ?LHern[place]12/04/2021 ?  Prox RCA lesion is 65% stenosed. ?  Prox LAD to Mid LAD lesion is 65%

## 2022-02-28 NOTE — Interval H&P Note (Signed)
History and Physical Interval Note: ? ?02/28/2022 ?9:07 AM ? ?Amy [name]  has presented today for surgery, with the diagnosis of chest pain.  The various methods of treatment have been discussed with the patient and family. After consideration of risks, benefits and other options for treatment, the patient has consented to  Procedure(s): ?RIGHT/LEFT HEART CATH AND CORONARY ANGIOGRAPHY (N/A) and possible coronary angioplasty as a surgical intervention.  The patient's history has been reviewed, patient examined, no change in status, stable for surgery.  I have reviewed the patient's chart and labs.  Questions were answered to the patient's satisfaction.   ? ? ?[name] ? ? ?

## 2022-02-28 NOTE — Progress Notes (Signed)
?Cardiology Progress Note  ?Patient ID: Amy [name] ?MRN: [id number] ?DOB: 02-11-1946 ?Date of Encounter: 02/28/2022 ? ?Primary Cardiologist: Buford Dresser, MD ? ?Subjective  ? ?Chief Complaint: None.  ? ?HPI: CVP 10 -15. +SOB. Doing welll. ? ?ROS:  ?All other ROS reviewed and negative. Pertinent positives noted in the HPI.    ? ?Inpatient Medications  ?Scheduled Meds: ? aspirin  324 mg Oral Once  ? aspirin EC  81 mg Oral Daily  ? Chlorhexidine Gluconate Cloth  6 each Topical Daily  ? clopidogrel  75 mg Oral Daily  ? insulin aspart  0-15 Units Subcutaneous TID WC  ? loratadine  10 mg Oral Daily  ? sodium chloride flush  10-40 mL Intracatheter Q12H  ? sodium chloride flush  3 mL Intravenous Q12H  ? ?Continuous Infusions: ? sodium chloride    ? DOBUTamine 2.5 mcg/kg/min (02/28/22 0743)  ? heparin 1,200 Units/hr (02/28/22 0743)  ? ?PRN Meds: ?sodium chloride, acetaminophen, alum & mag hydroxide-simeth, guaiFENesin-dextromethorphan, nitroGLYCERIN, ondansetron (ZOFRAN) IV, polyvinyl alcohol, sodium chloride flush, sodium chloride flush  ? ?Vital Signs  ? ?Vitals:  ? 02/27/22 2338 02/28/22 0438 02/28/22 0440 02/28/22 0732  ?BP: (!) 118/54  115/62 (!) 98/41  ?Pulse:    98  ?Resp: '20  20 20  '$ ?Temp: 97.7 ?F (36.5 ?C) 98.1 ?F (36.7 ?C) 98.1 ?F (36.7 ?C) 98.3 ?F (36.8 ?C)  ?TempSrc: Oral Oral Oral Oral  ?SpO2: 98%  98% 98%  ?Weight:   86.7 kg   ?Height:      ? ? ?Intake/Output Summary (Last 24 hours) at 02/28/2022 0758 ?Last data filed at 02/28/2022 0743 ?Gross per 24 hour  ?Intake 1190.79 ml  ?Output 1600 ml  ?Net -409.21 ml  ? ? ?  02/28/2022  ?  4:40 AM 02/27/2022  ?  5:13 AM 02/26/2022  ?  8:51 PM  ?Last 3 Weights  ?Weight (lbs) 191 lb 2.2 oz 187 lb 6.3 oz 184 lb 11.9 oz  ?Weight (kg) 86.7 kg 85 kg 83.8 kg  ?   ? ?Telemetry  ?Overnight telemetry shows ST 100s, which I personally reviewed.  ? ?ECG  ?The most recent ECG shows sinus rhythm heart rate 89 ST elevation with Q waves in the inferior leads, reciprocal changes  noted, right bundle branch block, which I personally reviewed.  ? ?Physical Exam  ? ?Vitals:  ? 02/27/22 2338 02/28/22 0438 02/28/22 0440 02/28/22 0732  ?BP: (!) 118/54  115/62 (!) 98/41  ?Pulse:    98  ?Resp: '20  20 20  '$ ?Temp: 97.7 ?F (36.5 ?C) 98.1 ?F (36.7 ?C) 98.1 ?F (36.7 ?C) 98.3 ?F (36.8 ?C)  ?TempSrc: Oral Oral Oral Oral  ?SpO2: 98%  98% 98%  ?Weight:   86.7 kg   ?Height:      ?  ?Intake/Output Summary (Last 24 hours) at 02/28/2022 0758 ?Last data filed at 02/28/2022 0743 ?Gross per 24 hour  ?Intake 1190.79 ml  ?Output 1600 ml  ?Net -409.21 ml  ?  ? ?  02/28/2022  ?  4:40 AM 02/27/2022  ?  5:13 AM 02/26/2022  ?  8:51 PM  ?Last 3 Weights  ?Weight (lbs) 191 lb 2.2 oz 187 lb 6.3 oz 184 lb 11.9 oz  ?Weight (kg) 86.7 kg 85 kg 83.8 kg  ?  Body mass index is 41.36 kg/m?.  ?General: Well nourished, well developed, in no acute distress ?Head: Atraumatic, normal size  ?Eyes: PEERLA, EOMI  ?Neck: Supple, 15-17 cmH2O JVD elevation ?Endocrine: No thryomegaly ?Cardiac:  Normal S1, S2; RRR; no murmurs, rubs, or gallops ?Lungs: Diminished breath sounds bilaterally ?Abd: Soft, nontender, no hepatomegaly  ?Ext: No edema, pulses 2+ ?Musculoskeletal: No deformities, BUE and BLE strength normal and equal ?Skin: Warm and dry, no rashes   ?Neuro: Alert and oriented to person, place, time, and situation, CNII-XII grossly intact, no focal deficits  ?Psych: Normal mood and affect  ? ?Labs  ?High Sensitivity Troponin:   ?Recent Labs  ?Lab 02/27/22 ?0620  ?TROPONINIHS 42,595*  ?   ?Cardiac EnzymesNo results for input(s): TROPONINI in the last 168 hours. No results for input(s): TROPIPOC in the last 168 hours.  ?Chemistry ?Recent Labs  ?Lab 02/26/22 ?0050 02/26/22 ?6387 02/27/22 ?5643 02/28/22 ?0450  ?NA 132* 132* 131* 131*  ?K 4.0 4.0 3.5 4.1  ?CL 93* 95* 98 99  ?CO2 22  --  23 25  ?GLUCOSE 150* 143* 125* 127*  ?BUN 65* 67* 56* 37*  ?CREATININE 2.01* 2.10* 1.24* 0.94  ?CALCIUM 8.9  --  8.1* 8.0*  ?PROT 7.2  --  6.5 6.4*  ?ALBUMIN 3.4*  --   2.8* 2.7*  ?AST 140*  --  130* 79*  ?ALT 95*  --  236* 236*  ?ALKPHOS 55  --  44 58  ?BILITOT 0.7  --  0.6 0.7  ?GFRNONAA 26*  --  46* >60  ?ANIONGAP 17*  --  10 7  ?  ?Hematology ?Recent Labs  ?Lab 02/27/22 ?3295 02/27/22 ?2000 02/28/22 ?0450  ?WBC 11.4* 11.4* 10.8*  ?RBC 3.03* 2.98* 2.99*  ?HGB 9.9* 9.9* 10.0*  ?HCT 29.1* 28.9* 28.9*  ?MCV 96.0 97.0 96.7  ?MCH 32.7 33.2 33.4  ?MCHC 34.0 34.3 34.6  ?RDW 13.8 13.8 13.9  ?PLT 200 199 197  ? ?BNP ?Recent Labs  ?Lab 02/26/22 ?0050  ?BNP 905.5*  ?  ?DDimer No results for input(s): DDIMER in the last 168 hours.  ? ?Radiology  ?ECHOCARDIOGRAM COMPLETE ? ?Result Date: 02/26/2022 ?   ECHOCARDIOGRAM REPORT   Patient Name:   DANGELA HOW Date of Exam: 02/26/2022 Medical Rec #:  [id number]      Height:       58.0 in Accession #:    [id number]     Weight:       184.6 lb Date of Birth:  [birth year]      BSA:          1.760 m? Patient Age:    76 years       BP:           93/75 mmHg Patient Gender: F              HR:           55 bpm. Exam Location:  Inpatient Procedure: 2D Echo and Intracardiac Opacification Agent Indications:     Myocardial infarction  History:         Patient has prior history of Echocardiogram examinations, most                  recent 02/06/2022. Risk Factors:Dyslipidemia.  Sonographer:     Arlyss Gandy Referring Phys:  [id number] [place] Diagnosing Phys: Fransico Him MD  Sonographer Comments: Image acquisition challenging due to patient body habitus. IMPRESSIONS  1. Left ventricular ejection fraction, by estimation, is 30 to 35%. The left ventricle has moderately decreased function. Left ventricular endocardial border not optimally defined to evaluate regional wall motion. There is moderate global LV dysfunction  but study is suggestive of inferolateral  akinesis. Definity contrast study is poor quality. Left ventricular diastolic parameters are consistent with Grade I diastolic dysfunction (impaired relaxation). There is the interventricular septum is  flattened in diastole ('D' shaped left ventricle), consistent with right ventricular volume overload.  2. Right ventricular systolic function is severely reduced. The right ventricular size is moderately enlarged. There is normal pulmonary artery systolic pressure. The estimated right ventricular systolic pressure is 27.0 mmHg.  3. Right atrial size was severely dilated.  4. The mitral valve is normal in structure. Trivial mitral valve regurgitation. No evidence of mitral stenosis.  5. Tricuspid valve regurgitation is moderate to severe.  6. The aortic valve is calcified. There is severe calcifcation of the aortic valve. There is moderate thickening of the aortic valve. Aortic valve regurgitation is trivial. Moderate to severe aortic valve stenosis. Aortic valve area, by VTI measures 0.85 cm?Marland Kitchen Aortic valve mean gradient measures 12.0 mmHg. Aortic valve Vmax measures 2.28 m/s.  7. The inferior vena cava is dilated in size with <50% respiratory variability, suggesting right atrial pressure of 15 mmHg.  8. The AVA is calculated at 0.85cm2 consistent with severe AS while the mean AVG is low at 3mHg and Vmax 2.25 m/s. In the setting of low DVI of 0.27 and low SVI of 23, suspect that this patient has at leaste moderate and possibly moderate to severe low flow low gradient AS in setting of HFrEF. FINDINGS  Left Ventricle: Left ventricular ejection fraction, by estimation, is 30 to 35%. The left ventricle has moderately decreased function. Left ventricular endocardial border not optimally defined to evaluate regional wall motion. Definity contrast agent was given IV to delineate the left ventricular endocardial borders. The left ventricular internal cavity size was normal in size. There is no left ventricular hypertrophy. The interventricular septum is flattened in diastole ('D' shaped left ventricle), consistent with right ventricular volume overload. Left ventricular diastolic parameters are consistent with Grade I  diastolic dysfunction (impaired relaxation). Normal left ventricular filling pressure. Right Ventricle: The right ventricular size is moderately enlarged. No increase in right ventricular wall thickness. R

## 2022-02-28 NOTE — Progress Notes (Signed)
Called and discussed Mrs. Amy [name] condition with her daughter Amy [name] by phone.  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  She does have what appears to be new left main disease.  This will need to be addressed at some point.  The advanced heart failure service will take over.  She is currently on dobutamine.  Hopefully she can be weaned in the next few days.  We will see how she does.  All questions were answered. ? ?[place] T. Audie Box, MD, [facility] ?[address]  ?[address], [address] ?[place], [place] [address] ?[phone]  ?4:39 PM ? ?

## 2022-02-28 NOTE — Consult Note (Addendum)
?  ?Advanced Heart Failure Team Consult Note ? ? ?Primary Physician: Shirline Frees, MD ?PCP-Cardiologist:  Buford Dresser, MD ? ?Reason for Consultation: RV infarct with cardiogenic shcok ? ?HPI:   ? ?Amy [name] is seen today for evaluation of RV infarct with cardiogenic shock at the request of Dr. Audie Box with Cardiology. 75 y.o. 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  ? ?Presented on 02/26/22 with late presentation inferior STEMI. Symptoms started a week prior to admission. Not taken for urgent cardiac cath as she completed her MI and had AKI. Course has been complicated by RV infarct>>cardiogenic shock. 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 EP consulted. Felt to be secondary to late presentation MI. It was felt that conduction would improve and no need for permanent pacemaker. ? ?Echo: EF 30-35%, RV severely dilated with severely reduced function, inferolateral akinesis, moderate to severe TR, moderate to severe AS ? ?She was given IV fluids and started on DBA for RV support.  Scr has improved 0.7>2.0>2.1>1.24>0.94. ? ?R/LHC today (on DBA 2.5): 100% RCA, 60% LM, 50% Lcx, 60% p to m LAD, 90% Ramus, RA 19 (with prominent v waves), PA 27/11 (19), PCWP 5, Fick CO 4.1/Index 2.3, PA sat 43%/49%, PAPi 0.8 ? ?Review of Systems: [y] = yes, '[ ]'$  = no  ? ?General: Weight gain '[ ]'$ ; Weight loss '[ ]'$ ; Anorexia '[ ]'$ ; Fatigue '[ ]'$ ; Fever '[ ]'$ ; Chills '[ ]'$ ; Weakness '[ ]'$   ?Cardiac: Chest pain/pressure '[ ]'$ ; Resting SOB '[ ]'$ ; Exertional SOB [Y ]; Orthopnea '[ ]'$ ; Pedal Edema '[ ]'$ ; Palpitations '[ ]'$ ; Syncope '[ ]'$ ; Presyncope '[ ]'$ ; Paroxysmal nocturnal dyspnea'[ ]'$   ?Pulmonary: Cough '[ ]'$ ; Wheezing'[ ]'$ ; Hemoptysis'[ ]'$ ; Sputum '[ ]'$ ; Snoring '[ ]'$   ?GI: Vomiting'[ ]'$ ; Dysphagia'[ ]'$ ; Melena'[ ]'$ ; Hematochezia '[ ]'$ ; Heartburn'[ ]'$ ; Abdominal pain '[ ]'$ ; Constipation '[ ]'$ ; Diarrhea '[ ]'$ ; BRBPR '[ ]'$   ?GU: Hematuria'[ ]'$ ; Dysuria '[ ]'$ ; Nocturia'[ ]'$    ?Vascular: Pain in legs with walking '[ ]'$ ; Pain in feet with lying flat '[ ]'$ ; Non-healing sores '[ ]'$ ; Stroke '[ ]'$ ; TIA '[ ]'$ ; Slurred speech '[ ]'$ ;  ?Neuro: Headaches'[ ]'$ ; Vertigo'[ ]'$ ; Seizures'[ ]'$ ; Paresthesias'[ ]'$ ;Blurred vision '[ ]'$ ; Diplopia '[ ]'$ ; Vision changes '[ ]'$   ?Ortho/Skin: Arthritis '[ ]'$ ; Joint pain '[ ]'$ ; Muscle pain '[ ]'$ ; Joint swelling '[ ]'$ ; Back Pain '[ ]'$ ; Rash '[ ]'$   ?Psych: Depression'[ ]'$ ; Anxiety'[ ]'$   ?Heme: Bleeding problems '[ ]'$ ; Clotting disorders '[ ]'$ ; Anemia '[ ]'$   ?Endocrine: Diabetes [Y]; Thyroid dysfunction'[ ]'$  ? ?Home Medications ?Prior to Admission medications   ?Medication Sig Start Date End Date Taking? Authorizing Provider  ?acetaminophen (TYLENOL) 500 MG tablet Take 500 mg by mouth every 6 (six) hours as needed for mild pain or headache.   Yes [provider]  ?ascorbic acid (VITAMIN C) 500 MG tablet Take 500 mg by mouth daily.   Yes [provider]  ?aspirin 81 MG EC tablet Take 81 mg by mouth every evening.   Yes [provider]  ?atorvastatin (LIPITOR) 80 MG tablet Take 1 tablet (80 mg total) by mouth daily. 10/24/21  Yes Patrecia Pour, MD  ?CALCIUM PO Take 1 tablet by mouth daily.   Yes [provider]  ?Carboxymethylcellulose Sodium (DRY EYE RELIEF OP) Place 1 drop into both eyes 4 (four) times daily as needed (dryness).   Yes [provider]  ?  cholecalciferol (VITAMIN D3) 25 MCG (1000 UNIT) tablet Take 1,000 Units by mouth daily.   Yes [provider]  ?CHROMIUM PO Take 1 capsule by mouth daily.   Yes [provider]  ?Cinnamon 500 MG capsule Take 500 mg by mouth daily.   Yes [provider]  ?Coenzyme Q10 (CO Q-10) 200 MG CAPS Take 200 mg by mouth daily.   Yes [provider]  ?Cyanocobalamin (VITAMIN B 12 PO) Take 1 tablet by mouth daily.   Yes [provider]  ?Flaxseed, Linseed, (FLAX SEED OIL) 1000 MG CAPS Take 1,000 mg by mouth daily.   Yes [provider]  ?Ginger 500 MG CAPS Take 500 mg by mouth daily.    Yes [provider]  ?levOCARNitine (L-CARNITINE PO) Take 1 tablet by mouth daily.   Yes [provider]  ?losartan (COZAAR) 25 MG tablet Take 1 tablet (25 mg total) by mouth daily. 10/23/21  Yes Patrecia Pour, MD  ?meclizine (ANTIVERT) 25 MG tablet Take 25 mg by mouth daily. 09/12/21  Yes [provider]  ?Methylsulfonylmethane (MSM PO) Take 1 capsule by mouth daily.   Yes [provider]  ?metoprolol succinate (TOPROL-XL) 25 MG 24 hr tablet Take 1 tablet (25 mg total) by mouth daily. 12/11/21  Yes Buford Dresser, MD  ?Multiple Vitamin (MULTI-VITAMIN) tablet Take 1 tablet by mouth daily.   Yes [provider]  ?Multiple Vitamin (STRESS B PO) Take 1 tablet by mouth daily.   Yes [provider]  ?Omega-3 Fatty Acids (FISH OIL) 1000 MG CAPS Take 1,000 mg by mouth daily.   Yes [provider]  ?Pramoxine-Dimethicone (GOLD BOND INTENSIVE HEALING EX) Apply 1 application topically daily as needed (eczema).   Yes [provider]  ?torsemide (DEMADEX) 20 MG tablet Take 1 tablet (20 mg total) by mouth daily. 10/24/21  Yes Patrecia Pour, MD  ?vitamin E 1000 UNIT capsule Take 1,000 Units by mouth daily.   Yes [provider]  ?Zinc 25 MG TABS Take 25 mg by mouth daily at 12 noon.   Yes [provider]  ? ? ?Past Medical History: ?Past Medical History:  ?Diagnosis Date  ? GERD (gastroesophageal reflux disease)   ? Hyperlipidemia   ? Obesity   ? Pre-diabetes   ? ? ?Past Surgical History: ?Past Surgical History:  ?Procedure Laterality Date  ? CARPAL TUNNEL RELEASE Bilateral   ? FEMUR FRACTURE SURGERY    ? REPLACEMENT TOTAL KNEE BILATERAL    ? RIGHT/LEFT HEART CATH AND CORONARY ANGIOGRAPHY N/A 10/22/2021  ? Procedure: RIGHT/LEFT HEART CATH AND CORONARY ANGIOGRAPHY;  Surgeon: Belva Crome, MD;  Location: [facility];  Service: Cardiovascular;  Laterality: N/A;  ? RIGHT/LEFT HEART CATH AND CORONARY ANGIOGRAPHY N/A 02/28/2022  ?  Procedure: RIGHT/LEFT HEART CATH AND CORONARY ANGIOGRAPHY;  Surgeon: Jolaine Artist, MD;  Location: [facility];  Service: Cardiovascular;  Laterality: N/A;  ? SHOULDER SURGERY Right   ? rotator cuff  ? ? ?Family History: ?Family History  ?Problem Relation Age of Onset  ? CAD Neg Hx   ? ? ?Social History: ?Social History  ? ?Socioeconomic History  ? Marital status: Married  ?  Spouse name: Not on file  ? Number of children: Not on file  ? Years of education: Not on file  ? Highest education level: Not on file  ?Occupational History  ? Occupation: retired  ?Tobacco Use  ? Smoking status: Never  ? Smokeless tobacco: Never  ?  Substance and Sexual Activity  ? Alcohol use: No  ? Drug use: No  ? Sexual activity: Not on file  ?Other Topics Concern  ? Not on file  ?Social History Narrative  ? Not on file  ? ?Social Determinants of Health  ? ?Financial Resource Strain: Not on file  ?Food Insecurity: Not on file  ?Transportation Needs: Not on file  ?Physical Activity: Not on file  ?Stress: Not on file  ?Social Connections: Not on file  ? ? ?Allergies:  ?Allergies  ?Allergen Reactions  ? Timolol Shortness Of Breath  ? Codeine Other (See Comments)  ?  sick  ? Morphine And Related Nausea And Vomiting  ? Other Other (See Comments)  ?  vicryl sutures - rash/redness, delay in healing  ? ? ?Objective:   ? ?Vital Signs:   ?Temp:  [97.7 ?F (36.5 ?C)-98.3 ?F (36.8 ?C)] 98 ?F (36.7 ?C) (04/14 1500) ?Pulse Rate:  [92-100] 100 (04/14 1500) ?Resp:  [16-23] 20 (04/14 1500) ?BP: (98-126)/(41-93) 114/84 (04/14 1500) ?SpO2:  [91 %-99 %] 96 % (04/14 1500) ?Weight:  [86.7 kg] 86.7 kg (04/14 0440) ?Last BM Date : 02/25/22 ? ?Weight change: ?Filed Weights  ? 02/26/22 2051 02/27/22 0513 02/28/22 0440  ?Weight: 83.8 kg 85 kg 86.7 kg  ? ? ?Intake/Output:  ? ?Intake/Output Summary (Last 24 hours) at 02/28/2022 1555 ?Last data filed at 02/28/2022 3546 ?Gross per 24 hour  ?Intake 970.79 ml  ?Output 1300 ml  ?Net -329.21 ml  ?  ? ? ?Physical  Exam  ?  ?General:  No distress. ?HEENT: normal ?Neck: supple. JVP to jaw. Carotids 2+ bilat; no bruits.  ?Cor: PMI nondisplaced. Regular rate & rhythm. No rubs, gallops. + TR murmur ?Lungs: clear ?Abdomen: obe

## 2022-02-28 NOTE — TOC Progression Note (Signed)
Transition of Care (TOC) - Progression Note  ? ? ?Patient Details  ?Name: Amy [name] ?MRN: [id number] ?Date of Birth: October 05, 1946 ? ?Transition of Care (TOC) CM/SW Contact  ?Zenon Mayo, RN ?Phone Number: ?02/28/2022, 4:27 PM ? ?Clinical Narrative:    ?From home, STEMI, CVP monitoring, conts on dobutamine, hep drip, for cath today.  ? ? ?  ?  ? ?Expected Discharge Plan and Services ?  ?  ?  ?  ?  ?                ?  ?  ?  ?  ?  ?  ?  ?  ?  ?  ? ? ?Social Determinants of Health (SDOH) Interventions ?  ? ?Readmission Risk Interventions ?   ? View : No data to display.  ?  ?  ?  ? ? ?

## 2022-02-28 NOTE — H&P (View-Only) (Signed)
?Cardiology Progress Note  ?Patient ID: Amy [name] ?MRN: [id number] ?DOB: [birth year] ?Date of Encounter: 02/28/2022 ? ?Primary Cardiologist: Buford Dresser, MD ? ?Subjective  ? ?Chief Complaint: None.  ? ?HPI: CVP 10 -15. +SOB. Doing welll. ? ?ROS:  ?All other ROS reviewed and negative. Pertinent positives noted in the HPI.    ? ?Inpatient Medications  ?Scheduled Meds: ? aspirin  324 mg Oral Once  ? aspirin EC  81 mg Oral Daily  ? Chlorhexidine Gluconate Cloth  6 each Topical Daily  ? clopidogrel  75 mg Oral Daily  ? insulin aspart  0-15 Units Subcutaneous TID WC  ? loratadine  10 mg Oral Daily  ? sodium chloride flush  10-40 mL Intracatheter Q12H  ? sodium chloride flush  3 mL Intravenous Q12H  ? ?Continuous Infusions: ? sodium chloride    ? DOBUTamine 2.5 mcg/kg/min (02/28/22 0743)  ? heparin 1,200 Units/hr (02/28/22 0743)  ? ?PRN Meds: ?sodium chloride, acetaminophen, alum & mag hydroxide-simeth, guaiFENesin-dextromethorphan, nitroGLYCERIN, ondansetron (ZOFRAN) IV, polyvinyl alcohol, sodium chloride flush, sodium chloride flush  ? ?Vital Signs  ? ?Vitals:  ? 02/27/22 2338 02/28/22 0438 02/28/22 0440 02/28/22 0732  ?BP: (!) 118/54  115/62 (!) 98/41  ?Pulse:    98  ?Resp: '20  20 20  '$ ?Temp: 97.7 ?F (36.5 ?C) 98.1 ?F (36.7 ?C) 98.1 ?F (36.7 ?C) 98.3 ?F (36.8 ?C)  ?TempSrc: Oral Oral Oral Oral  ?SpO2: 98%  98% 98%  ?Weight:   86.7 kg   ?Height:      ? ? ?Intake/Output Summary (Last 24 hours) at 02/28/2022 0758 ?Last data filed at 02/28/2022 0743 ?Gross per 24 hour  ?Intake 1190.79 ml  ?Output 1600 ml  ?Net -409.21 ml  ? ? ?  02/28/2022  ?  4:40 AM 02/27/2022  ?  5:13 AM 02/26/2022  ?  8:51 PM  ?Last 3 Weights  ?Weight (lbs) 191 lb 2.2 oz 187 lb 6.3 oz 184 lb 11.9 oz  ?Weight (kg) 86.7 kg 85 kg 83.8 kg  ?   ? ?Telemetry  ?Overnight telemetry shows ST 100s, which I personally reviewed.  ? ?ECG  ?The most recent ECG shows sinus rhythm heart rate 89 ST elevation with Q waves in the inferior leads, reciprocal changes  noted, right bundle branch block, which I personally reviewed.  ? ?Physical Exam  ? ?Vitals:  ? 02/27/22 2338 02/28/22 0438 02/28/22 0440 02/28/22 0732  ?BP: (!) 118/54  115/62 (!) 98/41  ?Pulse:    98  ?Resp: '20  20 20  '$ ?Temp: 97.7 ?F (36.5 ?C) 98.1 ?F (36.7 ?C) 98.1 ?F (36.7 ?C) 98.3 ?F (36.8 ?C)  ?TempSrc: Oral Oral Oral Oral  ?SpO2: 98%  98% 98%  ?Weight:   86.7 kg   ?Height:      ?  ?Intake/Output Summary (Last 24 hours) at 02/28/2022 0758 ?Last data filed at 02/28/2022 0743 ?Gross per 24 hour  ?Intake 1190.79 ml  ?Output 1600 ml  ?Net -409.21 ml  ?  ? ?  02/28/2022  ?  4:40 AM 02/27/2022  ?  5:13 AM 02/26/2022  ?  8:51 PM  ?Last 3 Weights  ?Weight (lbs) 191 lb 2.2 oz 187 lb 6.3 oz 184 lb 11.9 oz  ?Weight (kg) 86.7 kg 85 kg 83.8 kg  ?  Body mass index is 41.36 kg/m?.  ?General: Well nourished, well developed, in no acute distress ?Head: Atraumatic, normal size  ?Eyes: PEERLA, EOMI  ?Neck: Supple, 15-17 cmH2O JVD elevation ?Endocrine: No thryomegaly ?Cardiac:  Normal S1, S2; RRR; no murmurs, rubs, or gallops ?Lungs: Diminished breath sounds bilaterally ?Abd: Soft, nontender, no hepatomegaly  ?Ext: No edema, pulses 2+ ?Musculoskeletal: No deformities, BUE and BLE strength normal and equal ?Skin: Warm and dry, no rashes   ?Neuro: Alert and oriented to person, place, time, and situation, CNII-XII grossly intact, no focal deficits  ?Psych: Normal mood and affect  ? ?Labs  ?High Sensitivity Troponin:   ?Recent Labs  ?Lab 02/27/22 ?0620  ?TROPONINIHS 06,237*  ?   ?Cardiac EnzymesNo results for input(s): TROPONINI in the last 168 hours. No results for input(s): TROPIPOC in the last 168 hours.  ?Chemistry ?Recent Labs  ?Lab 02/26/22 ?0050 02/26/22 ?6283 02/27/22 ?1517 02/28/22 ?0450  ?NA 132* 132* 131* 131*  ?K 4.0 4.0 3.5 4.1  ?CL 93* 95* 98 99  ?CO2 22  --  23 25  ?GLUCOSE 150* 143* 125* 127*  ?BUN 65* 67* 56* 37*  ?CREATININE 2.01* 2.10* 1.24* 0.94  ?CALCIUM 8.9  --  8.1* 8.0*  ?PROT 7.2  --  6.5 6.4*  ?ALBUMIN 3.4*  --   2.8* 2.7*  ?AST 140*  --  130* 79*  ?ALT 95*  --  236* 236*  ?ALKPHOS 55  --  44 58  ?BILITOT 0.7  --  0.6 0.7  ?GFRNONAA 26*  --  46* >60  ?ANIONGAP 17*  --  10 7  ?  ?Hematology ?Recent Labs  ?Lab 02/27/22 ?6160 02/27/22 ?2000 02/28/22 ?0450  ?WBC 11.4* 11.4* 10.8*  ?RBC 3.03* 2.98* 2.99*  ?HGB 9.9* 9.9* 10.0*  ?HCT 29.1* 28.9* 28.9*  ?MCV 96.0 97.0 96.7  ?MCH 32.7 33.2 33.4  ?MCHC 34.0 34.3 34.6  ?RDW 13.8 13.8 13.9  ?PLT 200 199 197  ? ?BNP ?Recent Labs  ?Lab 02/26/22 ?0050  ?BNP 905.5*  ?  ?DDimer No results for input(s): DDIMER in the last 168 hours.  ? ?Radiology  ?ECHOCARDIOGRAM COMPLETE ? ?Result Date: 02/26/2022 ?   ECHOCARDIOGRAM REPORT   Patient Name:   Amy [name] Date of Exam: 02/26/2022 Medical Rec #:  [id number]      Height:       58.0 in Accession #:    [id number]     Weight:       184.6 lb Date of Birth:  1946/01/06      BSA:          1.760 m? Patient Age:    76 years       BP:           93/75 mmHg Patient Gender: F              HR:           55 bpm. Exam Location:  Inpatient Procedure: 2D Echo and Intracardiac Opacification Agent Indications:     Myocardial infarction  History:         Patient has prior history of Echocardiogram examinations, most                  recent 02/06/2022. Risk Factors:Dyslipidemia.  Sonographer:     Arlyss Gandy Referring Phys:  [id number] [place] Diagnosing Phys: Fransico Him MD  Sonographer Comments: Image acquisition challenging due to patient body habitus. IMPRESSIONS  1. Left ventricular ejection fraction, by estimation, is 30 to 35%. The left ventricle has moderately decreased function. Left ventricular endocardial border not optimally defined to evaluate regional wall motion. There is moderate global LV dysfunction  but study is suggestive of inferolateral  akinesis. Definity contrast study is poor quality. Left ventricular diastolic parameters are consistent with Grade I diastolic dysfunction (impaired relaxation). There is the interventricular septum is  flattened in diastole ('D' shaped left ventricle), consistent with right ventricular volume overload.  2. Right ventricular systolic function is severely reduced. The right ventricular size is moderately enlarged. There is normal pulmonary artery systolic pressure. The estimated right ventricular systolic pressure is 93.5 mmHg.  3. Right atrial size was severely dilated.  4. The mitral valve is normal in structure. Trivial mitral valve regurgitation. No evidence of mitral stenosis.  5. Tricuspid valve regurgitation is moderate to severe.  6. The aortic valve is calcified. There is severe calcifcation of the aortic valve. There is moderate thickening of the aortic valve. Aortic valve regurgitation is trivial. Moderate to severe aortic valve stenosis. Aortic valve area, by VTI measures 0.85 cm?Marland Kitchen Aortic valve mean gradient measures 12.0 mmHg. Aortic valve Vmax measures 2.28 m/s.  7. The inferior vena cava is dilated in size with <50% respiratory variability, suggesting right atrial pressure of 15 mmHg.  8. The AVA is calculated at 0.85cm2 consistent with severe AS while the mean AVG is low at 35mHg and Vmax 2.25 m/s. In the setting of low DVI of 0.27 and low SVI of 23, suspect that this patient has at leaste moderate and possibly moderate to severe low flow low gradient AS in setting of HFrEF. FINDINGS  Left Ventricle: Left ventricular ejection fraction, by estimation, is 30 to 35%. The left ventricle has moderately decreased function. Left ventricular endocardial border not optimally defined to evaluate regional wall motion. Definity contrast agent was given IV to delineate the left ventricular endocardial borders. The left ventricular internal cavity size was normal in size. There is no left ventricular hypertrophy. The interventricular septum is flattened in diastole ('D' shaped left ventricle), consistent with right ventricular volume overload. Left ventricular diastolic parameters are consistent with Grade I  diastolic dysfunction (impaired relaxation). Normal left ventricular filling pressure. Right Ventricle: The right ventricular size is moderately enlarged. No increase in right ventricular wall thickness. R

## 2022-02-28 NOTE — Care Management Important Message (Signed)
Important Message ? ?Patient Details  ?Name: Amy [name] ?MRN: [id number] ?Date of Birth: June 26, 1946 ? ? ?Medicare Important Message Given:  Yes ? ? ? ? ?Shelda Altes ?02/28/2022, 11:12 AM ?

## 2022-02-28 NOTE — Plan of Care (Signed)
?  Problem: Clinical Measurements: ?Goal: Will remain free from infection ?Outcome: Progressing ?  ?Problem: Activity: ?Goal: Risk for activity intolerance will decrease ?Outcome: Progressing ?  ?Problem: Coping: ?Goal: Level of anxiety will decrease ?Outcome: Progressing ?  ?Problem: Elimination: ?Goal: Will not experience complications related to urinary retention ?Outcome: Progressing ?  ?Problem: Pain Managment: ?Goal: General experience of comfort will improve ?Outcome: Progressing ?  ?Problem: Safety: ?Goal: Ability to remain free from injury will improve ?Outcome: Progressing ?  ?

## 2022-03-01 DIAGNOSIS — I50810 Right heart failure, unspecified: Secondary | ICD-10-CM | POA: Diagnosis not present

## 2022-03-01 DIAGNOSIS — R570 Cardiogenic shock: Secondary | ICD-10-CM | POA: Diagnosis not present

## 2022-03-01 DIAGNOSIS — I2111 ST elevation (STEMI) myocardial infarction involving right coronary artery: Secondary | ICD-10-CM | POA: Diagnosis not present

## 2022-03-01 LAB — GLUCOSE, CAPILLARY
Glucose-Capillary: 136 mg/dL — ABNORMAL HIGH (ref 70–99)
Glucose-Capillary: 117 mg/dL — ABNORMAL HIGH (ref 70–99)
Glucose-Capillary: 156 mg/dL — ABNORMAL HIGH (ref 70–99)
Glucose-Capillary: 128 mg/dL — ABNORMAL HIGH (ref 70–99)

## 2022-03-01 LAB — COOXEMETRY PANEL
Carboxyhemoglobin: 3.4 % — ABNORMAL HIGH (ref 0.5–1.5)
Methemoglobin: <0.7 % (ref 0.0–1.5)
O2 Saturation: 72.5 %
Total hemoglobin: 9.5 g/dL — ABNORMAL LOW (ref 12.0–16.0)

## 2022-03-01 LAB — CBC
HCT: 28.2 % — ABNORMAL LOW (ref 36.0–46.0)
Hemoglobin: 9.7 g/dL — ABNORMAL LOW (ref 12.0–15.0)
MCH: 33.6 pg (ref 26.0–34.0)
MCHC: 34.4 g/dL (ref 30.0–36.0)
MCV: 97.6 fL (ref 80.0–100.0)
Platelets: 208 10*3/uL (ref 150–400)
RBC: 2.89 MIL/uL — ABNORMAL LOW (ref 3.87–5.11)
RDW: 14.3 % (ref 11.5–15.5)
WBC: 10.9 10*3/uL — ABNORMAL HIGH (ref 4.0–10.5)
nRBC: 0 % (ref 0.0–0.2)

## 2022-03-01 LAB — COMPREHENSIVE METABOLIC PANEL
ALT: 212 U/L — ABNORMAL HIGH (ref 0–44)
AST: 68 U/L — ABNORMAL HIGH (ref 15–41)
Albumin: 2.5 g/dL — ABNORMAL LOW (ref 3.5–5.0)
Alkaline Phosphatase: 69 U/L (ref 38–126)
Anion gap: 5 (ref 5–15)
BUN: 32 mg/dL — ABNORMAL HIGH (ref 8–23)
CO2: 25 mmol/L (ref 22–32)
Calcium: 7.9 mg/dL — ABNORMAL LOW (ref 8.9–10.3)
Chloride: 104 mmol/L (ref 98–111)
Creatinine, Ser: 0.84 mg/dL (ref 0.44–1.00)
GFR, Estimated: >60 mL/min (ref >60)
Glucose, Bld: 138 mg/dL — ABNORMAL HIGH (ref 70–99)
Potassium: 4.9 mmol/L (ref 3.5–5.1)
Sodium: 134 mmol/L — ABNORMAL LOW (ref 135–145)
Total Bilirubin: 0.5 mg/dL (ref 0.3–1.2)
Total Protein: 6.3 g/dL — ABNORMAL LOW (ref 6.5–8.1)

## 2022-03-01 MED ORDER — TORSEMIDE 20 MG PO TABS
40.0000 mg | ORAL_TABLET | Freq: Every day | ORAL | Status: DC
  Administered 2022-03-01 – 2022-03-02 (×2): 40 mg via ORAL
  Filled 2022-03-01 (×2): qty 2

## 2022-03-01 MED ORDER — SPIRONOLACTONE 12.5 MG HALF TABLET
12.5000 mg | ORAL_TABLET | Freq: Every day | ORAL | Status: DC
  Administered 2022-03-01 – 2022-03-02 (×2): 12.5 mg via ORAL
  Filled 2022-03-01 (×2): qty 1

## 2022-03-01 MED ORDER — DIGOXIN 125 MCG PO TABS
0.1250 mg | ORAL_TABLET | Freq: Every day | ORAL | Status: DC
  Administered 2022-03-01 – 2022-03-05 (×5): 0.125 mg via ORAL
  Filled 2022-03-01 (×5): qty 1

## 2022-03-01 NOTE — Evaluation (Signed)
Occupational Therapy Evaluation ?Patient Details ?Name: Amy [name] ?MRN: [id number] ?DOB: Jul 12, 1946 ?Today's Date: 03/01/2022 ? ? ?History of Present Illness 76 y.o. female with history HFrEF with subsequent recovery in LV function, HLD, aortic valve stenosis, GERD, prediabetes, obesity. Admitted with later presentation inferior STEMI c/b RV infarct and cardiogenic shock.  Underwent cardiac cath 4/14.  ? ?Clinical Impression ?  ?Patient admitted for the diagnosis above.  PTA she lives in her home, and her daughter stays with her.  She occasionally uses her cane for mobility, but typically walks without an AD.  She is independent with her own self care, completes her own meal prep and performs light home management.  Her daughter is available to assist if needed, and her preference is to return home.  OT will follow in the acute setting to maximize her functional status, and no post acute OT is anticipated.  Defer to MD for post acute cardiac rehab recommendations.    ?   ? ?Recommendations for follow up therapy are one component of a multi-disciplinary discharge planning process, led by the attending physician.  Recommendations may be updated based on patient status, additional functional criteria and insurance authorization.  ? ?Follow Up Recommendations ? No OT follow up  ?  ?Assistance Recommended at Discharge Set up Supervision/Assistance  ?Patient can return home with the following   ? ?  ?Functional Status Assessment ? Patient has had a recent decline in their functional status and demonstrates the ability to make significant improvements in function in a reasonable and predictable amount of time.  ?Equipment Recommendations ? None recommended by OT  ?  ?Recommendations for Other Services   ? ? ?  ?Precautions / Restrictions Precautions ?Precautions: Fall ?Precaution Comments: O2 ?Restrictions ?Weight Bearing Restrictions: No  ? ?  ? ?Mobility Bed Mobility ?Overal bed mobility: Needs Assistance ?Bed  Mobility: Supine to Sit, Sit to Supine ?  ?  ?Supine to sit: Supervision ?Sit to supine: Supervision ?  ?  ?  ? ?Transfers ?Overall transfer level: Needs assistance ?  ?Transfers: Sit to/from Stand, Bed to chair/wheelchair/BSC ?Sit to Stand: Supervision ?Stand pivot transfers: Min guard ?  ?  ?  ?  ?  ?  ? ?  ?Balance Overall balance assessment: Needs assistance ?Sitting-balance support: Feet supported ?Sitting balance-Leahy Scale: Good ?  ?  ?Standing balance support: Single extremity supported ?Standing balance-Leahy Scale: Fair ?  ?  ?  ?  ?  ?  ?  ?  ?  ?  ?  ?  ?   ? ?ADL either performed or assessed with clinical judgement  ? ?ADL   ?  ?  ?Grooming: Wash/dry hands;Supervision/safety;Standing ?  ?  ?  ?  ?  ?Upper Body Dressing : Supervision/safety;Sitting ?  ?Lower Body Dressing: Min guard;Sit to/from stand ?  ?Toilet Transfer: Supervision/safety;Stand-pivot;BSC/3in1 ?  ?  ?  ?  ?  ?  ?   ? ? ? ?Vision Baseline Vision/History: 1 Wears glasses ?Patient Visual Report: No change from baseline ?   ?   ?Perception Perception ?Perception: Within Functional Limits ?  ?Praxis Praxis ?Praxis: Intact ?  ? ?Pertinent Vitals/Pain Pain Assessment ?Pain Assessment: No/denies pain  ? ? ? ?Hand Dominance Right ?  ?Extremity/Trunk Assessment Upper Extremity Assessment ?Upper Extremity Assessment: Overall WFL for tasks assessed ?  ?Lower Extremity Assessment ?Lower Extremity Assessment: Defer to PT evaluation ?  ?Cervical / Trunk Assessment ?Cervical / Trunk Assessment: Kyphotic ?  ?Communication Communication ?Communication: No difficulties ?  ?  Cognition Arousal/Alertness: Awake/alert ?Behavior During Therapy: [facility] for tasks assessed/performed ?Overall Cognitive Status: Within Functional Limits for tasks assessed ?  ?  ?  ?  ?  ?  ?  ?  ?  ?  ?  ?  ?  ?  ?  ?  ?  ?  ?  ?General Comments   No O2 at home ? ?  ?Exercises   ?  ?Shoulder Instructions    ? ? ?Home Living Family/patient expects to be discharged to:: Private  residence ?Living Arrangements: Children ?Available Help at Discharge: Family;Available 24 hours/day ?Type of Home: House ?Home Access: Stairs to enter ?Entrance Stairs-Number of Steps: 2 ?Entrance Stairs-Rails: None ?Home Layout: One level ?  ?  ?Bathroom Shower/Tub: Tub/shower unit ?  ?Bathroom Toilet: Handicapped height ?  ?  ?Home Equipment: Conservation officer, nature (2 wheels);Cane - quad;BSC/3in1;Shower seat;Hand held shower head;Adaptive equipment ?Adaptive Equipment: Reacher;Sock aid ?  ?  ? ?  ?Prior Functioning/Environment   ?  ?  ?  ?  ?  ?  ?Mobility Comments: uses quad cane PRN ?ADLs Comments: Continues to drive, no assist with ADL.  Assists with home management and meals. ?  ? ?  ?  ?OT Problem List: Impaired balance (sitting and/or standing);Decreased activity tolerance ?  ?   ?OT Treatment/Interventions: Self-care/ADL training;Therapeutic activities;Patient/family education;Balance training  ?  ?OT Goals(Current goals can be found in the care plan section) Acute Rehab OT Goals ?Patient Stated Goal: Return home ?OT Goal Formulation: With patient ?Time For Goal Achievement: 03/14/22 ?Potential to Achieve Goals: Good ?ADL Goals ?Pt Will Perform Grooming: Independently;standing ?Pt Will Perform Upper Body Dressing: Independently;sitting ?Pt Will Perform Lower Body Dressing: Independently;sit to/from stand ?Pt Will Transfer to Toilet: ambulating;with modified independence;regular height toilet  ?OT Frequency: Min 2X/week ?  ? ?Co-evaluation   ?  ?  ?  ?  ? ?  ?AM-PAC OT "6 Clicks" Daily Activity     ?Outcome Measure Help from another person eating meals?: None ?Help from another person taking care of personal grooming?: None ?Help from another person toileting, which includes using toliet, bedpan, or urinal?: A Little ?Help from another person bathing (including washing, rinsing, drying)?: A Little ?Help from another person to put on and taking off regular upper body clothing?: A Little ?Help from another person to  put on and taking off regular lower body clothing?: A Little ?6 Click Score: 20 ?  ?End of Session Equipment Utilized During Treatment: Oxygen ?Nurse Communication: Mobility status ? ?Activity Tolerance: Patient tolerated treatment well ?Patient left: in bed;with call bell/phone within reach;with nursing/sitter in room ? ?OT Visit Diagnosis: Unsteadiness on feet (R26.81)  ?              ?Time: 7948-0165 ?OT Time Calculation (min): 18 min ?Charges:  OT General Charges ?$OT Visit: 1 Visit ?OT Evaluation ?$OT Eval Moderate Complexity: 1 Mod ? ?03/01/2022 ? ?RP, OTR/L ? ?Acute Rehabilitation Services ? ?Office:  [phone] ? ? ?Albertus Chiarelli D Xitlaly Ault ?03/01/2022, 5:04 PM ?

## 2022-03-01 NOTE — Progress Notes (Signed)
? ? ?Advanced Heart Failure Rounding Note ? ? ?Subjective:   ? ?Cath yesterday with totally occluded RCA with RV infarct physiology and severe TR. ? ?Remains on DBA 2.5 o-ox ok. CVP 15-16 ? ?Denies SOB, orthopnea or PND. Feels weak  ? ? ? ?Objective:   ?Weight Range: ? ?Vital Signs:   ?Temp:  [97.7 ?F (36.5 ?C)-98.2 ?F (36.8 ?C)] 97.8 ?F (36.6 ?C) (04/15 1125) ?Pulse Rate:  [95-104] 97 (04/15 1125) ?Resp:  [18-23] 23 (04/15 1125) ?BP: (95-127)/(55-84) 119/63 (04/15 1125) ?SpO2:  [96 %-97 %] 97 % (04/15 1125) ?Weight:  [85.7 kg] 85.7 kg (04/15 0017) ?Last BM Date : 03/01/22 ? ?Weight change: ?Filed Weights  ? 02/27/22 0513 02/28/22 0440 03/01/22 0017  ?Weight: 85 kg 86.7 kg 85.7 kg  ? ? ?Intake/Output:  ? ?Intake/Output Summary (Last 24 hours) at 03/01/2022 1148 ?Last data filed at 03/01/2022 1125 ?Gross per 24 hour  ?Intake 165.32 ml  ?Output 1150 ml  ?Net -984.68 ml  ?  ? ?Physical Exam: ?General:  Lying in bed. No resp difficulty ?HEENT: normal ?Neck: supple. JVP to ear . Carotids 2+ bilat; no bruits. No lymphadenopathy or thryomegaly appreciated. ?Cor: PMI nondisplaced. Regular rate & rhythm. 2/6 TR ?Lungs: clear ?Abdomen: obese soft, nontender, nondistended. No hepatosplenomegaly. No bruits or masses. Good bowel sounds. ?Extremities: no cyanosis, clubbing, rash, 2+ edema ?Neuro: alert & orientedx3, cranial nerves grossly intact. moves all 4 extremities w/o difficulty. Affect pleasant ? ?Telemetry: Sinus 90-100 Personally reviewed ? ?Labs: ?Basic Metabolic Panel: ?Recent Labs  ?Lab 02/26/22 ?0050 02/26/22 ?0998 02/27/22 ?3382 02/28/22 ?5053 02/28/22 ?9767 03/01/22 ?0402  ?NA 132* 132* 131* 131* 137 134*  ?K 4.0 4.0 3.5 4.1 4.1 4.9  ?CL 93* 95* 98 99  --  104  ?CO2 22  --  23 25  --  25  ?GLUCOSE 150* 143* 125* 127*  --  138*  ?BUN 65* 67* 56* 37*  --  32*  ?CREATININE 2.01* 2.10* 1.24* 0.94  --  0.84  ?CALCIUM 8.9  --  8.1* 8.0*  --  7.9*  ?MG 3.4*  --   --   --   --   --   ? ? ?Liver Function Tests: ?Recent Labs   ?Lab 02/26/22 ?3419 02/27/22 ?3790 02/28/22 ?2409 03/01/22 ?0402  ?AST 140* 130* 79* 68*  ?ALT 95* 236* 236* 212*  ?ALKPHOS 73 53 29 92  ?BILITOT 0.7 0.6 0.7 0.5  ?PROT 7.2 6.5 6.4* 6.3*  ?ALBUMIN 3.4* 2.8* 2.7* 2.5*  ? ?No results for input(s): LIPASE, AMYLASE in the last 168 hours. ?No results for input(s): AMMONIA in the last 168 hours. ? ?CBC: ?Recent Labs  ?Lab 02/26/22 ?0050 02/26/22 ?4268 02/27/22 ?3419 02/27/22 ?2000 02/28/22 ?6222 02/28/22 ?9798 03/01/22 ?0402  ?WBC 12.0*  --  11.4* 11.4* 10.8*  --  10.9*  ?NEUTROABS 7.9*  --   --   --   --   --   --   ?HGB 11.8*   < > 9.9* 9.9* 10.0* 9.5* 9.7*  ?HCT 35.8*   < > 29.1* 28.9* 28.9* 28.0* 28.2*  ?MCV 99.2  --  96.0 97.0 96.7  --  97.6  ?PLT 199  --  200 199 197  --  208  ? < > = values in this interval not displayed.  ? ? ?Cardiac Enzymes: ?No results for input(s): CKTOTAL, CKMB, CKMBINDEX, TROPONINI in the last 168 hours. ? ?BNP: ?BNP (last 3 results) ?Recent Labs  ?  10/15/21 ?0629 10/17/21 ?[place]  02/26/22 ?0050  ?BNP 991.7* 363.8* 905.5*  ? ? ?ProBNP (last 3 results) ?No results for input(s): PROBNP in the last 8760 hours. ? ? ? ?Other results: ? ?Imaging: ?CARDIAC CATHETERIZATION ? ?Result Date: 02/28/2022 ?  Ost RCA to Prox RCA lesion is 100% stenosed.   Ost LM lesion is 60% stenosed.   Ost Cx lesion is 50% stenosed.   Prox LAD to Mid LAD lesion is 60% stenosed.   Ramus lesion is 90% stenosed. Findings: On dobutamine 2.5 Ao = 83/53 (67) LV = 92/14 RA = 19 (with prominent v- waves) RV = 28/18 PA = 27/11 (19) PCW = 5 Fick cardiac output/index = 4.1/2.3 PVR = 3.4 WU FA sat = 88% PA sat = 43%, 49% PAPi = 0.8 AoV gradient peak to peak 2.26mHG on pullback Assessment: 1. 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 Borderline 60% LM lesion 3. Mild AS 4. 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 Will review LM lesion with IC team. Currently stable and no urgent need for  revascularization. DGlori Bickers MD 11:11 AM ? ?UKoreaAbdomen Limited RUQ (LIVER/GB) ? ?Result Date: 02/28/2022 ?CLINICAL DATA:  Elevated LFTs EXAM: ULTRASOUND ABDOMEN LIMITED RIGHT UPPER QUADRANT COMPARISON:  None. FINDINGS: Gallbladder: No gallstones or wall thickening visualized. No sonographic Murphy sign noted by sonographer. Common bile duct: Diameter: 3.3 mL Liver: No focal lesion identified. Within normal limits in parenchymal echogenicity. Portal vein is patent on color Doppler imaging with normal direction of blood flow towards the liver. Other: Trace ascites. IMPRESSION: 1. Normal sonographic appearance of the gallbladder and liver. 2. Trace ascites. Electronically Signed   By: LYetta GlassmanM.D.   On: 02/28/2022 08:14   ? ? ?Medications:   ? ? ?Scheduled Medications: ? aspirin EC  81 mg Oral Daily  ? Chlorhexidine Gluconate Cloth  6 each Topical Daily  ? clopidogrel  75 mg Oral Daily  ? enoxaparin (LOVENOX) injection  40 mg Subcutaneous Q24H  ? insulin aspart  0-15 Units Subcutaneous TID WC  ? loratadine  10 mg Oral Daily  ? polyethylene glycol  17 g Oral Daily  ? sodium chloride flush  10-40 mL Intracatheter Q12H  ? sodium chloride flush  3 mL Intravenous Q12H  ? sodium chloride flush  3 mL Intravenous Q12H  ? ? ?Infusions: ? sodium chloride    ? sodium chloride    ? DOBUTamine 2.5 mcg/kg/min (03/01/22 0440)  ? ? ?PRN Medications: ?sodium chloride, sodium chloride, acetaminophen, alum & mag hydroxide-simeth, guaiFENesin-dextromethorphan, nitroGLYCERIN, ondansetron (ZOFRAN) IV, polyvinyl alcohol, sodium chloride flush, sodium chloride flush, sodium chloride flush ? ? ?Assessment/Plan:  ? ?1. 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 Has totally occluded RCA which has been managed medically ?-RHC hemodynamics on 2.5 DBA c/w severe RV failure and severe TR - RA 19 (prominent v waves), PCWP 5, Fick CO 4.1/Index 2.3, PAPi 2.8 ?-Continue DBA at  2.5 for RV support. Co-ox 72%. Start wean soon ?- Add digoxin ?  ?2. CAD with Inferior STEMI: ?-Late presentation MI.  ?-LHC: 100% RCA, 60% LM, 50% Lcx, 60% p to m LAD, 90% Ramus. Will manage CAD medically ?-Continue DAPT with aspirin and plavix ?-Start statin ?-No beta blocker with shock/RV failure ?  ?3. Acute biventricular heart failure: ?-EF down to 30-35% in 12/22 in setting of COVID-19 infection. LV function subsequently recovered, EF up to 50-55%  03/23. ?-Echo 04/23: EF 30-35%, RV severely reduced, moderate to severe TR, moderate to severe aortic valve stenosis with mean gradient 12 mmHg and AVA 0.85 cm2 ?-Continue DBA 2.5 as above ?-RA elevated at 19 but PCWP only 5. Hold off on further diuresis for now. Need to avoid volume depletion with RV failure. ?-Add digoxin and spiro ?- Low-dose torsemide ?-No beta blocker with CGS ?-Consider SGLT2i prior to discharge but need to watch for UTI and hygiene with morbid obesity ?  ?4. Tricuspid Regurgitation: ?-Moderate to severe on echo ?-RHC consistent with severe TR ?-Medical management for now. Hopefully will improve as RV improves ?  ?5. Aortic valve stenosis: ?-Mild to moderate ?  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 EP has seen, no indications for pacemaker. It was felt that this would likely recovery with time. ?  ?7. AKI: ?-Scr up 2.2 in setting of shock. Resolved with inotrope support. ?-Daily BMET ?  ?8. Acute liver injury: ?-2/2 shock ?-Improving with hemodynamic support ?  ?9. Prediabetes: ?-A1c 6.3%. ?-Consider SGLT2i prior to discharge but watch for UTI with morbid obesity ?  ?10. GOC: ?Palliative care consulted. She is now DNR. ?  ? ?Length of Stay: 3 ? ? ?Glori Bickers MD ?03/01/2022, 11:48 AM ? ?Advanced Heart Failure Team ?Pager [phone] (M-F; 7a - 4p)  ?Please contact [facility] for night-coverage after hours (4p -7a ) and weekends on [email or web address] ? ?

## 2022-03-02 DIAGNOSIS — I50810 Right heart failure, unspecified: Secondary | ICD-10-CM | POA: Diagnosis not present

## 2022-03-02 DIAGNOSIS — I2111 ST elevation (STEMI) myocardial infarction involving right coronary artery: Secondary | ICD-10-CM | POA: Diagnosis not present

## 2022-03-02 LAB — GLUCOSE, CAPILLARY
Glucose-Capillary: 130 mg/dL — ABNORMAL HIGH (ref 70–99)
Glucose-Capillary: 158 mg/dL — ABNORMAL HIGH (ref 70–99)
Glucose-Capillary: 106 mg/dL — ABNORMAL HIGH (ref 70–99)
Glucose-Capillary: 102 mg/dL — ABNORMAL HIGH (ref 70–99)

## 2022-03-02 LAB — COMPREHENSIVE METABOLIC PANEL
ALT: 187 U/L — ABNORMAL HIGH (ref 0–44)
AST: 61 U/L — ABNORMAL HIGH (ref 15–41)
Albumin: 2.5 g/dL — ABNORMAL LOW (ref 3.5–5.0)
Alkaline Phosphatase: 76 U/L (ref 38–126)
Anion gap: 6 (ref 5–15)
BUN: 27 mg/dL — ABNORMAL HIGH (ref 8–23)
CO2: 28 mmol/L (ref 22–32)
Calcium: 7.5 mg/dL — ABNORMAL LOW (ref 8.9–10.3)
Chloride: 101 mmol/L (ref 98–111)
Creatinine, Ser: 0.82 mg/dL (ref 0.44–1.00)
GFR, Estimated: >60 mL/min (ref >60)
Glucose, Bld: 122 mg/dL — ABNORMAL HIGH (ref 70–99)
Potassium: 4.4 mmol/L (ref 3.5–5.1)
Sodium: 135 mmol/L (ref 135–145)
Total Bilirubin: 0.6 mg/dL (ref 0.3–1.2)
Total Protein: 6.4 g/dL — ABNORMAL LOW (ref 6.5–8.1)

## 2022-03-02 LAB — COOXEMETRY PANEL
Carboxyhemoglobin: 4 % — ABNORMAL HIGH (ref 0.5–1.5)
Methemoglobin: 1.2 % (ref 0.0–1.5)
O2 Saturation: 72.6 %
Total hemoglobin: 9.9 g/dL — ABNORMAL LOW (ref 12.0–16.0)

## 2022-03-02 MED ORDER — FUROSEMIDE 10 MG/ML IJ SOLN
80.0000 mg | Freq: Two times a day (BID) | INTRAMUSCULAR | Status: DC
  Administered 2022-03-02: 80 mg via INTRAVENOUS
  Filled 2022-03-02 (×2): qty 8

## 2022-03-02 NOTE — Evaluation (Signed)
Physical Therapy Evaluation ?Patient Details ?Name: Amy [name] ?MRN: [id number] ?DOB: [birth year] ?Today's Date: 03/02/2022 ? ?History of Present Illness ? The pt is a 76 yo female presenting 4/12 with SOB, fround to have inferior STEMI complicated by RV infarct and cardiogenic shock. R/L heart cath 4/14. PMH includes: HFrEF, HLD, aortic valve stenosis, GERD, prediabetes, and obesity. ?  ?Clinical Impression ? Pt in bed upon arrival of PT, agreeable to evaluation at this time. Prior to admission the pt was mobilizing in the home with use of cane, but recently started use of RW just prior to admission. She lives at home with a daughter, but was largely independent prior to admission. The pt now presents with limitations in functional mobility, power, endurance, and dynamic stability due to above dx, and will continue to benefit from skilled PT to address these deficits. The pt required multiple attempts to power up to standing position and needed minA to steady in stance initially due to posterior LOB. The pt was then able to complete 75 ft hallway ambulation prior to needing standing rest, and then completed multiple short bouts of gait to return to room with frequent need for standing rest. HR to max 144bpm with gait but did recover to 110s-120s with standing rest. SpO2 stable >90% on 1L. The pt will continue to benefit from skilled PT acutely to progress endurance, wean O2 with exertion, and facilitate return towards use of cane for mobility. May benefit from HHPT to further progress endurance and return to baseline independence.  ? ?Gait Speed: 0.73ms using RW and with 1L O2. (Gait speed <0.68m indicates increased risk of falls and dependence in ADLs) ?   ?   ? ?Recommendations for follow up therapy are one component of a multi-disciplinary discharge planning process, led by the attending physician.  Recommendations may be updated based on patient status, additional functional criteria and insurance  authorization. ? ?Follow Up Recommendations Home health PT ? ?  ?Assistance Recommended at Discharge Intermittent Supervision/Assistance  ?Patient can return home with the following ? A little help with walking and/or transfers;A little help with bathing/dressing/bathroom;Assistance with cooking/housework;Assist for transportation;Help with stairs or ramp for entrance ? ?  ?Equipment Recommendations None recommended by PT  ?Recommendations for Other Services ?    ?  ?Functional Status Assessment Patient has had a recent decline in their functional status and demonstrates the ability to make significant improvements in function in a reasonable and predictable amount of time.  ? ?  ?Precautions / Restrictions Precautions ?Precautions: Fall ?Precaution Comments: watch O2, on 1-2 L this session ?Restrictions ?Weight Bearing Restrictions: No  ? ?  ? ?Mobility ? Bed Mobility ?Overal bed mobility: Needs Assistance ?Bed Mobility: Supine to Sit ?  ?  ?Supine to sit: Supervision, HOB elevated ?  ?  ?General bed mobility comments: increased time and effort ?  ? ?Transfers ?Overall transfer level: Needs assistance ?Equipment used: Rolling walker (2 wheels) ?Transfers: Sit to/from Stand ?Sit to Stand: Min guard ?  ?  ?  ?  ?  ?General transfer comment: multiple attempts to rise to standing ?  ? ?Ambulation/Gait ?Ambulation/Gait assistance: Min guard, Supervision ?Gait Distance (Feet): 75 Feet (+ 4561f 45 ft) ?Assistive device: Rolling walker (2 wheels) ?Gait Pattern/deviations: Step-through pattern, Decreased stride length, Trunk flexed ?Gait velocity: 0.21 m/s ?Gait velocity interpretation: <1.31 ft/sec, indicative of household ambulator ?  ?General Gait Details: pt with small steps, reports slower than her baseline. HR to 144 bpm, SpO2 stable on 1L ?  ? ?  Balance Overall balance assessment: Needs assistance ?Sitting-balance support: Feet supported ?Sitting balance-Leahy Scale: Good ?  ?  ?Standing balance support: Bilateral  upper extremity supported ?Standing balance-Leahy Scale: Fair ?Standing balance comment: dependent on BUE support for gait ?  ?  ?  ?  ?  ?  ?  ?  ?  ?  ?  ?   ? ? ? ?Pertinent Vitals/Pain Pain Assessment ?Pain Assessment: No/denies pain  ? ? ?Home Living Family/patient expects to be discharged to:: Private residence ?Living Arrangements: Children ?Available Help at Discharge: Family;Available 24 hours/day ?Type of Home: House ?Home Access: Stairs to enter ?Entrance Stairs-Rails: None ?Entrance Stairs-Number of Steps: 2 ?  ?Home Layout: One level ?Home Equipment: Conservation officer, nature (2 wheels);Cane - quad;BSC/3in1;Shower seat;Hand held shower head;Adaptive equipment ?   ?  ?Prior Function Prior Level of Function : Needs assist ?  ?  ?  ?  ?  ?  ?Mobility Comments: uses quad cane as needeed usually, RW more reccently PTA ?ADLs Comments: Continues to drive, no assist with ADL.  Assists with home management and meals. ?  ? ? ?Hand Dominance  ? Dominant Hand: Right ? ?  ?Extremity/Trunk Assessment  ? Upper Extremity Assessment ?Upper Extremity Assessment: Defer to OT evaluation ?  ? ?Lower Extremity Assessment ?Lower Extremity Assessment: Overall WFL for tasks assessed ?  ? ?Cervical / Trunk Assessment ?Cervical / Trunk Assessment: Kyphotic  ?Communication  ? Communication: No difficulties  ?Cognition Arousal/Alertness: Awake/alert ?Behavior During Therapy: [facility] for tasks assessed/performed ?Overall Cognitive Status: Within Functional Limits for tasks assessed ?  ?  ?  ?  ?  ?  ?  ?  ?  ?  ?  ?  ?  ?  ?  ?  ?General Comments: pt needing increased time, particular with movement but able to demo good safety awarenss. ?  ?  ? ?  ?General Comments General comments (skin integrity, edema, etc.): SpO2 stable on 1L with gait, left on RA with SpO2 96%. HR to 149 bpm with activity, recovered to 110s with standing rest ? ?  ?Exercises    ? ?Assessment/Plan  ?  ?PT Assessment Patient needs continued PT services  ?PT Problem List  Cardiopulmonary status limiting activity;Decreased activity tolerance;Decreased balance ? ?   ?  ?PT Treatment Interventions DME instruction;Gait training;Stair training;Functional mobility training;Therapeutic activities;Therapeutic exercise;Balance training;Patient/family education   ? ?PT Goals (Current goals can be found in the Care Plan section)  ?Acute Rehab PT Goals ?Patient Stated Goal: rreturn home ?PT Goal Formulation: With patient ?Time For Goal Achievement: 03/16/22 ?Potential to Achieve Goals: Good ? ?  ?Frequency Min 3X/week ?  ? ? ?   ?AM-PAC PT "6 Clicks" Mobility  ?Outcome Measure Help needed turning from your back to your side while in a flat bed without using bedrails?: A Little ?Help needed moving from lying on your back to sitting on the side of a flat bed without using bedrails?: A Little ?Help needed moving to and from a bed to a chair (including a wheelchair)?: A Little ?Help needed standing up from a chair using your arms (e.g., wheelchair or bedside chair)?: A Little ?Help needed to walk in hospital room?: A Little ?Help needed climbing 3-5 steps with a railing? : A Little ?6 Click Score: 18 ? ?  ?End of Session Equipment Utilized During Treatment: Gait belt;Oxygen ?Activity Tolerance: Patient tolerated treatment well ?Patient left: in chair;with call bell/phone within reach;with nursing/sitter in room ?Nurse Communication: Mobility status ?PT Visit  Diagnosis: Unsteadiness on feet (R26.81);Other abnormalities of gait and mobility (R26.89) ?  ? ?Time: 8099-8338 ?PT Time Calculation (min) (ACUTE ONLY): 43 min ? ? ?Charges:   PT Evaluation ?$PT Eval Low Complexity: 1 Low ?PT Treatments ?$Therapeutic Exercise: 23-37 mins ?  ?   ? ? ?West Carbo, PT, DPT  ? ?Acute Rehabilitation Department ?Pager #: [phone] ? ?Sandra Cockayne ?03/02/2022, 10:59 AM ? ?

## 2022-03-02 NOTE — Progress Notes (Signed)
? ? ?Advanced Heart Failure Rounding Note ? ? ?Subjective:   ? ?Cath 4/14  with totally occluded RCA with RV infarct physiology and severe TR. ? ?Remains on DBA 2.5  co-ox 73%  ? ?Started on torsemide yesterday. Weight down 2 pounds but CVP up to 21-22 ? ?Denies SOB, orthopnea or PND.  ? ?Seen by PT/OT. Recommending HHPT ? ?Objective:   ?Weight Range: ? ?Vital Signs:   ?Temp:  [97.7 ?F (36.5 ?C)-98.1 ?F (36.7 ?C)] 98.1 ?F (36.7 ?C) (04/16 0813) ?Pulse Rate:  [88-101] 95 (04/16 0813) ?Resp:  [14-22] 18 (04/16 0813) ?BP: (108-124)/(52-79) 109/79 (04/16 0813) ?SpO2:  [95 %-98 %] 97 % (04/16 0813) ?Weight:  [84.8 kg] 84.8 kg (04/16 0552) ?Last BM Date : 02/28/22 ? ?Weight change: ?Filed Weights  ? 02/28/22 0440 03/01/22 0017 03/02/22 0552  ?Weight: 86.7 kg 85.7 kg 84.8 kg  ? ? ?Intake/Output:  ? ?Intake/Output Summary (Last 24 hours) at 03/02/2022 1152 ?Last data filed at 03/02/2022 1042 ?Gross per 24 hour  ?Intake 991.26 ml  ?Output 1650 ml  ?Net -658.74 ml  ? ?  ? ?Physical Exam: ?General:  Sitting up in bed. No resp difficulty ?HEENT: normal ?Neck: supple. JVP to ear Carotids 2+ bilat; no bruits. No lymphadenopathy or thryomegaly appreciated. ?Cor: PMI nondisplaced. Regular rate & rhythm. 2/6 TR ?Lungs: clear ?Abdomen: obese soft, nontender, nondistended. No hepatosplenomegaly. No bruits or masses. Good bowel sounds. ?Extremities: no cyanosis, clubbing, rash, 1-2+ edema ?Neuro: alert & orientedx3, cranial nerves grossly intact. moves all 4 extremities w/o difficulty. Affect pleasant ? ? ?Telemetry: Sinus 90-100 Personally reviewed ? ?Labs: ?Basic Metabolic Panel: ?Recent Labs  ?Lab 02/26/22 ?0050 02/26/22 ?3570 02/27/22 ?1779 02/28/22 ?0450 02/28/22 ?3903 03/01/22 ?0402 03/02/22 ?0092  ?NA 132* 132* 131* 131* 137 134* 135  ?K 4.0 4.0 3.5 4.1 4.1 4.9 4.4  ?CL 93* 95* 98 99  --  104 101  ?CO2 22  --  23 25  --  25 28  ?GLUCOSE 150* 143* 125* 127*  --  138* 122*  ?BUN 65* 67* 56* 37*  --  32* 27*  ?CREATININE 2.01* 2.10*  1.24* 0.94  --  0.84 0.82  ?CALCIUM 8.9  --  8.1* 8.0*  --  7.9* 7.5*  ?MG 3.4*  --   --   --   --   --   --   ? ? ? ?Liver Function Tests: ?Recent Labs  ?Lab 02/26/22 ?3300 02/27/22 ?7622 02/28/22 ?0450 03/01/22 ?0402 03/02/22 ?6333  ?AST 140* 130* 79* 68* 61*  ?ALT 95* 236* 236* 212* 187*  ?ALKPHOS 54 56 25 63 89  ?BILITOT 0.7 0.6 0.7 0.5 0.6  ?PROT 7.2 6.5 6.4* 6.3* 6.4*  ?ALBUMIN 3.4* 2.8* 2.7* 2.5* 2.5*  ? ? ?No results for input(s): LIPASE, AMYLASE in the last 168 hours. ?No results for input(s): AMMONIA in the last 168 hours. ? ?CBC: ?Recent Labs  ?Lab 02/26/22 ?0050 02/26/22 ?3734 02/27/22 ?2876 02/27/22 ?2000 02/28/22 ?8115 02/28/22 ?7262 03/01/22 ?0402  ?WBC 12.0*  --  11.4* 11.4* 10.8*  --  10.9*  ?NEUTROABS 7.9*  --   --   --   --   --   --   ?HGB 11.8*   < > 9.9* 9.9* 10.0* 9.5* 9.7*  ?HCT 35.8*   < > 29.1* 28.9* 28.9* 28.0* 28.2*  ?MCV 99.2  --  96.0 97.0 96.7  --  97.6  ?PLT 199  --  200 199 197  --  208  ? < > =  values in this interval not displayed.  ? ? ? ?Cardiac Enzymes: ?No results for input(s): CKTOTAL, CKMB, CKMBINDEX, TROPONINI in the last 168 hours. ? ?BNP: ?BNP (last 3 results) ?Recent Labs  ?  10/15/21 ?0629 10/17/21 ?1220 02/26/22 ?0050  ?BNP 991.7* 363.8* 905.5*  ? ? ? ?ProBNP (last 3 results) ?No results for input(s): PROBNP in the last 8760 hours. ? ? ? ?Other results: ? ?Imaging: ?No results found. ? ? ?Medications:   ? ? ?Scheduled Medications: ? aspirin EC  81 mg Oral Daily  ? Chlorhexidine Gluconate Cloth  6 each Topical Daily  ? clopidogrel  75 mg Oral Daily  ? digoxin  0.125 mg Oral Daily  ? enoxaparin (LOVENOX) injection  40 mg Subcutaneous Q24H  ? insulin aspart  0-15 Units Subcutaneous TID WC  ? loratadine  10 mg Oral Daily  ? polyethylene glycol  17 g Oral Daily  ? sodium chloride flush  10-40 mL Intracatheter Q12H  ? spironolactone  12.5 mg Oral Daily  ? torsemide  40 mg Oral Daily  ? ? ?Infusions: ? DOBUTamine 2.5 mcg/kg/min (03/01/22 0440)  ? ? ?PRN  Medications: ?acetaminophen, alum & mag hydroxide-simeth, guaiFENesin-dextromethorphan, ondansetron (ZOFRAN) IV, polyvinyl alcohol, sodium chloride flush ? ? ?Assessment/Plan:  ? ?1. 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 Has totally occluded RCA which has been managed medically ?-RHC hemodynamics on 2.5 DBA c/w severe RV failure and severe TR - RA 19 (prominent v waves), PCWP 5, Fick CO 4.1/Index 2.3, PAPi 2.8 ?- Echo as below ?- Continue DBA at 2.5 for RV support. Co-ox 73%. Wean to DBA at 1 ?- Continue digoxin  ?  ?2. CAD with Inferior STEMI: ?-Late presentation MI.  ?-LHC: 100% RCA, 60% LM, 50% Lcx, 60% p to m LAD, 90% Ramus. Will manage CAD medically ?-Continue DAPT/statin ?-No beta blocker with shock/RV failure ?  ?3. Acute biventricular heart failure: ?-EF down to 30-35% in 12/22 in setting of COVID-19 infection. LV function subsequently recovered, EF up to 50-55% 03/23. ?-Echo 04/23: EF 30-35%, RV severely reduced, moderate to severe TR, moderate to severe aortic valve stenosis with mean gradient 12 mmHg and AVA 0.85 cm2 ?-Continue DBA 2.5 as above ?-RA elevated at 19 but PCWP only 5. Hold off on further diuresis for now. Need to avoid volume depletion with RV failure. ?- Continue digoxin and spiro ?-CVP climbing. Will give IV lasix carefully.  ?- No beta blocker with CGS ?- Consider SGLT2i prior to discharge but need to watch for UTI and hygiene with morbid obesity ?  ?4. Tricuspid Regurgitation: ?-Moderate to severe on echo ?-RHC consistent with severe TR ?-Medical management for now. Hopefully will improve as RV improves otherwise can cosndier clip ?  ?5. Aortic valve stenosis: ?-Mild to moderate ?  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 EP has seen, no indications for pacemaker. It was felt that this would likely recovery with time. ?  ?7. AKI: ?-Scr up 2.2 in setting of shock. Resolved with inotrope support. ?-Daily BMET ?   ?8. Acute liver injury: ?-2/2 shock ?-Improving with hemodynamic support ?  ?9. Prediabetes: ?-A1c 6.3%. ?-Consider SGLT2i prior to discharge but watch for UTI with morbid obesity ?  ?10. GOC: ?Palliative care consulted. She is now DNR. ?  ? ?Length of Stay: 4 ? ? ?Glori Bickers MD ?03/02/2022, 11:52 AM ? ?Advanced Heart Failure Team ?Pager [phone] (M-F; 7a - 4p)  ?Please contact  [facility] for night-coverage after hours (4p -7a ) and weekends on [email or web address] ? ?

## 2022-03-03 DIAGNOSIS — I50810 Right heart failure, unspecified: Secondary | ICD-10-CM | POA: Diagnosis not present

## 2022-03-03 DIAGNOSIS — I249 Acute ischemic heart disease, unspecified: Secondary | ICD-10-CM | POA: Diagnosis not present

## 2022-03-03 LAB — POCT I-STAT EG7
Acid-Base Excess: 0 mmol/L (ref 0.0–2.0)
Bicarbonate: 25.7 mmol/L (ref 20.0–28.0)
Calcium, Ion: 1.15 mmol/L (ref 1.15–1.40)
HCT: 28 % — ABNORMAL LOW (ref 36.0–46.0)
Hemoglobin: 9.5 g/dL — ABNORMAL LOW (ref 12.0–15.0)
O2 Saturation: 43 %
Potassium: 4.3 mmol/L (ref 3.5–5.1)
Sodium: 136 mmol/L (ref 135–145)
TCO2: 27 mmol/L (ref 22–32)
pCO2, Ven: 43.2 mmHg — ABNORMAL LOW (ref 44–60)
pH, Ven: 7.383 (ref 7.25–7.43)
pO2, Ven: 25 mmHg — CL (ref 32–45)

## 2022-03-03 LAB — POCT I-STAT 7, (LYTES, BLD GAS, ICA,H+H)
Acid-base deficit: 4 mmol/L — ABNORMAL HIGH (ref 0.0–2.0)
Bicarbonate: 20.8 mmol/L (ref 20.0–28.0)
Calcium, Ion: 0.85 mmol/L — CL (ref 1.15–1.40)
HCT: 23 % — ABNORMAL LOW (ref 36.0–46.0)
Hemoglobin: 7.8 g/dL — ABNORMAL LOW (ref 12.0–15.0)
O2 Saturation: 88 %
Potassium: 3.4 mmol/L — ABNORMAL LOW (ref 3.5–5.1)
Sodium: 143 mmol/L (ref 135–145)
TCO2: 22 mmol/L (ref 22–32)
pCO2 arterial: 34.4 mmHg (ref 32–48)
pH, Arterial: 7.389 (ref 7.35–7.45)
pO2, Arterial: 55 mmHg — ABNORMAL LOW (ref 83–108)

## 2022-03-03 LAB — GLUCOSE, CAPILLARY
Glucose-Capillary: 139 mg/dL — ABNORMAL HIGH (ref 70–99)
Glucose-Capillary: 173 mg/dL — ABNORMAL HIGH (ref 70–99)
Glucose-Capillary: 102 mg/dL — ABNORMAL HIGH (ref 70–99)
Glucose-Capillary: 173 mg/dL — ABNORMAL HIGH (ref 70–99)

## 2022-03-03 LAB — COOXEMETRY PANEL
Carboxyhemoglobin: 2.9 % — ABNORMAL HIGH (ref 0.5–1.5)
Methemoglobin: 1 % (ref 0.0–1.5)
O2 Saturation: 58.4 %
Total hemoglobin: 10 g/dL — ABNORMAL LOW (ref 12.0–16.0)

## 2022-03-03 LAB — BASIC METABOLIC PANEL
Anion gap: 8 (ref 5–15)
BUN: 22 mg/dL (ref 8–23)
CO2: 28 mmol/L (ref 22–32)
Calcium: 7.6 mg/dL — ABNORMAL LOW (ref 8.9–10.3)
Chloride: 100 mmol/L (ref 98–111)
Creatinine, Ser: 0.79 mg/dL (ref 0.44–1.00)
GFR, Estimated: >60 mL/min (ref >60)
Glucose, Bld: 123 mg/dL — ABNORMAL HIGH (ref 70–99)
Potassium: 4.1 mmol/L (ref 3.5–5.1)
Sodium: 136 mmol/L (ref 135–145)

## 2022-03-03 MED ORDER — ATORVASTATIN CALCIUM 80 MG PO TABS
80.0000 mg | ORAL_TABLET | Freq: Every day | ORAL | Status: DC
Start: 2022-03-04 — End: 2022-03-05
  Administered 2022-03-04 – 2022-03-05 (×2): 80 mg via ORAL
  Filled 2022-03-03 (×2): qty 1

## 2022-03-03 MED ORDER — SPIRONOLACTONE 25 MG PO TABS
25.0000 mg | ORAL_TABLET | Freq: Every day | ORAL | Status: DC
  Administered 2022-03-03 – 2022-03-05 (×3): 25 mg via ORAL
  Filled 2022-03-03 (×3): qty 1

## 2022-03-03 MED ORDER — TORSEMIDE 20 MG PO TABS
40.0000 mg | ORAL_TABLET | Freq: Every day | ORAL | Status: DC
  Administered 2022-03-03 – 2022-03-05 (×3): 40 mg via ORAL
  Filled 2022-03-03 (×3): qty 2

## 2022-03-03 NOTE — Progress Notes (Addendum)
? ? ?Advanced Heart Failure Rounding Note ? ? ?Subjective:   ? ?Cath 4/14  with totally occluded RCA with RV infarct physiology and severe TR. ?4/16 Given IV lasix. I/O not accurate. DBA weaned to 1 mcg  ? ?Remains on DBA 2.5   co-ox 58%  ? ?Having some dizziness when standing. Says takes meclizine daily,.  ? ?Objective:   ?Weight Range: ? ?Vital Signs:   ?Temp:  [97.7 ?F (36.5 ?C)-98.5 ?F (36.9 ?C)] 97.7 ?F (36.5 ?C) (04/17 8144) ?Pulse Rate:  [79-101] 79 (04/17 0743) ?Resp:  [15-22] 15 (04/17 0743) ?BP: (109-145)/(58-79) 134/75 (04/17 0743) ?SpO2:  [92 %-97 %] 94 % (04/17 0328) ?Weight:  [84 kg] 84 kg (04/17 0005) ?Last BM Date : 02/28/22 ? ?Weight change: ?Filed Weights  ? 03/01/22 0017 03/02/22 0552 03/03/22 0005  ?Weight: 85.7 kg 84.8 kg 84 kg  ? ? ?Intake/Output:  ? ?Intake/Output Summary (Last 24 hours) at 03/03/2022 0752 ?Last data filed at 03/03/2022 0011 ?Gross per 24 hour  ?Intake 1092.19 ml  ?Output 1650 ml  ?Net -557.81 ml  ?  ?CVP 14  ?Physical Exam: ?General:  Sitting in the chair.  No resp difficulty ?HEENT: normal ?Neck: supple. JVP to jaw. Carotids 2+ bilat; no bruits. No lymphadenopathy or thryomegaly appreciated. ?Cor: PMI nondisplaced. Regular rate & rhythm. No rubs, gallops. 2/6 TR . ?Lungs: clear ?Abdomen: soft, nontender, nondistended. No hepatosplenomegaly. No bruits or masses. Good bowel sounds. ?Extremities: no cyanosis, clubbing, rash, edema ?Neuro: alert & orientedx3, cranial nerves grossly intact. moves all 4 extremities w/o difficulty. Affect pleasant ? ? ?Telemetry: SR-ST 90-100s ?Labs: ?Basic Metabolic Panel: ?Recent Labs  ?Lab 02/26/22 ?0050 02/26/22 ?8185 02/27/22 ?6314 02/28/22 ?0450 02/28/22 ?9702 03/01/22 ?0402 03/02/22 ?6378 03/03/22 ?5885  ?NA 132*   < > 131* 131* 137 134* 135 136  ?K 4.0   < > 3.5 4.1 4.1 4.9 4.4 4.1  ?CL 93*   < > 98 99  --  104 101 100  ?CO2 22  --  23 25  --  '25 28 28  '$ ?GLUCOSE 150*   < > 125* 127*  --  138* 122* 123*  ?BUN 65*   < > 56* 37*  --  32* 27* 22   ?CREATININE 2.01*   < > 1.24* 0.94  --  0.84 0.82 0.79  ?CALCIUM 8.9  --  8.1* 8.0*  --  7.9* 7.5* 7.6*  ?MG 3.4*  --   --   --   --   --   --   --   ? < > = values in this interval not displayed.  ? ? ?Liver Function Tests: ?Recent Labs  ?Lab 02/26/22 ?0277 02/27/22 ?4128 02/28/22 ?0450 03/01/22 ?0402 03/02/22 ?7867  ?AST 140* 130* 79* 68* 61*  ?ALT 95* 236* 236* 212* 187*  ?ALKPHOS 67 20 94 70 96  ?BILITOT 0.7 0.6 0.7 0.5 0.6  ?PROT 7.2 6.5 6.4* 6.3* 6.4*  ?ALBUMIN 3.4* 2.8* 2.7* 2.5* 2.5*  ? ?No results for input(s): LIPASE, AMYLASE in the last 168 hours. ?No results for input(s): AMMONIA in the last 168 hours. ? ?CBC: ?Recent Labs  ?Lab 02/26/22 ?0050 02/26/22 ?2836 02/27/22 ?6294 02/27/22 ?2000 02/28/22 ?7654 02/28/22 ?6503 03/01/22 ?0402  ?WBC 12.0*  --  11.4* 11.4* 10.8*  --  10.9*  ?NEUTROABS 7.9*  --   --   --   --   --   --   ?HGB 11.8*   < > 9.9* 9.9* 10.0* 9.5* 9.7*  ?  HCT 35.8*   < > 29.1* 28.9* 28.9* 28.0* 28.2*  ?MCV 99.2  --  96.0 97.0 96.7  --  97.6  ?PLT 199  --  200 199 197  --  208  ? < > = values in this interval not displayed.  ? ? ?Cardiac Enzymes: ?No results for input(s): CKTOTAL, CKMB, CKMBINDEX, TROPONINI in the last 168 hours. ? ?BNP: ?BNP (last 3 results) ?Recent Labs  ?  10/15/21 ?0629 10/17/21 ?1220 02/26/22 ?0050  ?BNP 991.7* 363.8* 905.5*  ? ? ?ProBNP (last 3 results) ?No results for input(s): PROBNP in the last 8760 hours. ? ? ? ?Other results: ? ?Imaging: ?No results found. ? ? ?Medications:   ? ? ?Scheduled Medications: ? aspirin EC  81 mg Oral Daily  ? Chlorhexidine Gluconate Cloth  6 each Topical Daily  ? clopidogrel  75 mg Oral Daily  ? digoxin  0.125 mg Oral Daily  ? enoxaparin (LOVENOX) injection  40 mg Subcutaneous Q24H  ? furosemide  80 mg Intravenous BID  ? insulin aspart  0-15 Units Subcutaneous TID WC  ? loratadine  10 mg Oral Daily  ? polyethylene glycol  17 g Oral Daily  ? sodium chloride flush  10-40 mL Intracatheter Q12H  ? spironolactone  12.5 mg Oral Daily   ? ? ?Infusions: ? DOBUTamine 2.5 mcg/kg/min (03/02/22 1802)  ? ? ?PRN Medications: ?acetaminophen, alum & mag hydroxide-simeth, guaiFENesin-dextromethorphan, ondansetron (ZOFRAN) IV, polyvinyl alcohol, sodium chloride flush ? ? ?Assessment/Plan:  ? ?1. 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 Has totally occluded RCA which has been managed medically ?-RHC hemodynamics on 2.5 DBA c/w severe RV failure and severe TR - RA 19 (prominent v waves), PCWP 5, Fick CO 4.1/Index 2.3, PAPi 2.8 ?- Echo as below ?- Cut back DBA 36mg.  CO-OX 58%.  ?- .  Continue digoxin  ?  ?2. CAD with Inferior STEMI: ?-Late presentation MI.  ?-LHC: 100% RCA, 60% LM, 50% Lcx, 60% p to m LAD, 90% Ramus. Will manage CAD medically ?-Continue DAPT/statin ?-No beta blocker with shock/RV failure ?  ?3. Acute biventricular heart failure: ?-EF down to 30-35% in 12/22 in setting of COVID-19 infection. LV function subsequently recovered, EF up to 50-55% 03/23. ?-Echo 04/23: EF 30-35%, RV severely reduced, moderate to severe TR, moderate to severe aortic valve stenosis with mean gradient 12 mmHg and AVA 0.85 cm2 ?-RA elevated at 19 but PCWP only 5.  ?- Cut back DBA to 1 mcg. CO-OX 58%. ?- CVP 14-15 today . Start torsemide 40 mg daily.  ?- Continue digoxin ?- Increase spiro to 25 mg daily ?- No beta blocker with CGS ?- Consider SGLT2i prior to discharge but need to watch for UTI and hygiene with morbid obesity ?  ?4. Tricuspid Regurgitation: ?-Moderate to severe on echo ?-RHC consistent with severe TR ?-Medical management for now. Hopefully will improve as RV improves otherwise can cosndier clip ?  ?5. Aortic valve stenosis: ?-Mild to moderate ?  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 EP has seen, no indications for pacemaker. It was felt that this would likely recovery with time. ?  ?7. AKI: ?-Scr up 2.2 in setting of shock. Resolved with inotrope support. ?-Daily BMET ?  ?8.  Acute liver injury: ?-2/2 shock ?-Improving with hemodynamic support ?  ?9. Prediabetes: ?-A1c 6.3%. ?-Consider SGLT2i prior to discharge but watch for UTI with morbid obesity ?  ?10. GOC: ?Palliative care  consulted. She is now DNR. ? ?Length of Stay: 5 ? ? ?Amy Clegg NP-C  ?03/03/2022, 7:52 AM ? ?Advanced Heart Failure Team ?Pager [phone] (M-F; 7a - 4p)  ?Please contact [facility] for night-coverage after hours (4p -7a ) and weekends on [email or web address] ? ?Patient seen and examined with the above-signed Advanced Practice Provider and/or Housestaff. I personally reviewed laboratory data, imaging studies and relevant notes. I independently examined the patient and formulated the important aspects of the plan. I have edited the note to reflect any of my changes or salient points. I have personally discussed the plan with the patient and/or family. ? ?Remains on DBA 2.5. Co-ox 58%. CVP 14. Mild dizziness on standing which is chronic for her. No CP or SOB.  ? ?General:  Elderly obese woman lying in bed . No resp difficulty ?HEENT: normal ?Neck: supple. JVP to jaw Carotids 2+ bilat; no bruits. No lymphadenopathy or thryomegaly appreciated. ?Cor: PMI nondisplaced. Regular rate & rhythm. 2/6 TR ?Lungs: clear ?Abdomen: soft, nontender, nondistended. No hepatosplenomegaly. No bruits or masses. Good bowel sounds. ?Extremities: no cyanosis, clubbing, rash, 1+ edema ?Neuro: alert & orientedx3, cranial nerves grossly intact. moves all 4 extremities w/o difficulty. Affect pleasant ? ?Remains tenuous after RV infarct. Wean DBA slowly. Be careful with diuresis. Goal CVP likely 8-12. Check orthostatics.  ? ?Glori Bickers, MD  ?6:24 PM ? ? ? ?

## 2022-03-03 NOTE — TOC Initial Note (Addendum)
Transition of Care (TOC) - Initial/Assessment Note  ? ? ?Patient Details  ?Name: Amy [name] ?MRN: [id number] ?Date of Birth: 11-26-1945 ? ?Transition of Care [facility]) CM/SW Contact:    ?Marcheta Grammes Rexene Alberts, RN ?Phone Number: [phone] ?03/03/2022, 4:11 PM ? ?Clinical Narrative:                 ?HF TOC CM spoke to pt at bedside. States dtr assist her at home. Offered choice for [facility]. Pt states she had [facility] in the past for Home Health.Contacted [facility] rep, Marjory Lies with new referral. Pt states she has needed DME. Will continue to follow for dc needs. Will need HH RN/PT orders with [facility].  ? ?03/04/2022 [place] orders are in [place]. ? ?Expected Discharge Plan: [place] ?Barriers to Discharge: Continued Medical Work up ? ? ?Patient Goals and CMS Choice ?Patient states their goals for this hospitalization and ongoing recovery are:: wants to get well ?CMS Medicare.gov Compare Post Acute Care list provided to:: Patient ?Choice offered to / list presented to : Patient ? ?Expected Discharge Plan and Services ?Expected Discharge Plan: [place] ?  ?Discharge Planning Services: CM Consult ?Post Acute Care Choice: Home Health ?Living arrangements for the past 2 months: [place] ?                ?  ?  ?  ?  ?  ?HH Arranged: PT ?[place] Agency: [place] ?Date HH Agency Contacted: 03/03/22 ?Time [place]: 1829 ?Representative spoke with at [place]: Romie Jumper ? ?Prior Living Arrangements/Services ?Living arrangements for the past 2 months: [place] ?Lives with:: Adult Children ?  ?Do you feel safe going back to the place where you live?: Yes      ?Need for Family Participation in Patient Care: Yes (Comment) ?Care giver support system in place?: Yes (comment) ?Current home services: DME, motor wheelchair, rolling walker, bedside commode, tub bench, quad canes ?Criminal Activity/Legal Involvement Pertinent to Current Situation/Hospitalization: No - Comment  as needed ? ?Activities of Daily Living ?Home Assistive Devices/Equipment: Cane (specify quad or straight), Shower chair with back ?ADL Screening (condition at time of admission) ?Patient's cognitive ability adequate to safely complete daily activities?: Yes ?Is the patient deaf or have difficulty hearing?: No ?Does the patient have difficulty seeing, even when wearing glasses/contacts?: No ?Does the patient have difficulty concentrating, remembering, or making decisions?: No ?Patient able to express need for assistance with ADLs?: Yes ?Does the patient have difficulty dressing or bathing?: No ?Independently performs ADLs?: Yes (appropriate for developmental age) ?Does the patient have difficulty walking or climbing stairs?: Yes ?Weakness of Legs: Both ?Weakness of Arms/Hands: None ? ?Permission Sought/Granted ?Permission sought to share information with : Case Manager, Family Supports, PCP ?Permission granted to share information with : Yes, Verbal Permission Granted ? Share Information with NAME: Jamesia Linnen ? Permission granted to share info w AGENCY: [facility] ? Permission granted to share info w Relationship: daughter ? Permission granted to share info w Contact Information: [phone] ? ?Emotional Assessment ?Appearance:: Appears stated age ?Attitude/Demeanor/Rapport: Engaged ?Affect (typically observed): Accepting ?Orientation: : Oriented to Self, Oriented to Place, Oriented to  Time, Oriented to Situation ?  ?Psych Involvement: No (comment) ? ?Admission diagnosis:  Acute coronary syndrome ([place]) [I24.9] ?STEMI (ST elevation myocardial infarction) ([place]) [I21.3] ?AKI (acute kidney injury) ([place]) [N17.9] ?Patient Active Problem List  ? Diagnosis Date Noted  ? Acute liver failure 02/28/2022  ?  Cardiogenic shock ([place]) 02/28/2022  ? Acute coronary syndrome (HCC)   ? AV block, Mobitz 2 02/27/2022  ? Pressure injury of skin 02/27/2022  ? STEMI (ST elevation myocardial infarction) ([place]) 02/26/2022  ? AKI (acute  kidney injury) ([place]) 02/26/2022  ? Dyslipidemia 02/26/2022  ? Prediabetes 02/26/2022  ? Obesity (BMI 30-39.9) 02/26/2022  ? NICM (nonischemic cardiomyopathy) ([place]) 12/11/2021  ? Nonocclusive coronary atherosclerosis of native coronary artery 12/11/2021  ? History of COVID-19 12/11/2021  ? Chronic combined systolic and diastolic heart failure ([place]) 12/11/2021  ? Nonrheumatic aortic valve stenosis 12/11/2021  ? COVID   ? Congestive heart failure ([place])   ? Acute CHF (congestive heart failure) ([place]) 10/15/2021  ? ?PCP:  Shirline Frees, MD ?Pharmacy:   ?[facility] ([facility] ?[place] [address] ?Phone: [phone] Fax: [phone] ? ? ? ? ?Social Determinants of Health (SDOH) Interventions ?  ? ?Readmission Risk Interventions ?   ? View : No data to display.  ?  ?  ?  ? ? ? ?

## 2022-03-04 DIAGNOSIS — I2111 ST elevation (STEMI) myocardial infarction involving right coronary artery: Secondary | ICD-10-CM | POA: Diagnosis not present

## 2022-03-04 DIAGNOSIS — I50810 Right heart failure, unspecified: Secondary | ICD-10-CM | POA: Diagnosis not present

## 2022-03-04 LAB — BASIC METABOLIC PANEL
Anion gap: 7 (ref 5–15)
BUN: 21 mg/dL (ref 8–23)
CO2: 29 mmol/L (ref 22–32)
Calcium: 7.6 mg/dL — ABNORMAL LOW (ref 8.9–10.3)
Chloride: 101 mmol/L (ref 98–111)
Creatinine, Ser: 0.74 mg/dL (ref 0.44–1.00)
GFR, Estimated: >60 mL/min (ref >60)
Glucose, Bld: 116 mg/dL — ABNORMAL HIGH (ref 70–99)
Potassium: 4.3 mmol/L (ref 3.5–5.1)
Sodium: 137 mmol/L (ref 135–145)

## 2022-03-04 LAB — GLUCOSE, CAPILLARY
Glucose-Capillary: 117 mg/dL — ABNORMAL HIGH (ref 70–99)
Glucose-Capillary: 127 mg/dL — ABNORMAL HIGH (ref 70–99)
Glucose-Capillary: 120 mg/dL — ABNORMAL HIGH (ref 70–99)
Glucose-Capillary: 153 mg/dL — ABNORMAL HIGH (ref 70–99)

## 2022-03-04 LAB — COOXEMETRY PANEL
Carboxyhemoglobin: 3.6 % — ABNORMAL HIGH (ref 0.5–1.5)
Methemoglobin: 0.9 % (ref 0.0–1.5)
O2 Saturation: 62.3 %
Total hemoglobin: 10.1 g/dL — ABNORMAL LOW (ref 12.0–16.0)

## 2022-03-04 MED ORDER — DAPAGLIFLOZIN PROPANEDIOL 10 MG PO TABS
10.0000 mg | ORAL_TABLET | Freq: Every day | ORAL | Status: DC
  Administered 2022-03-04 – 2022-03-05 (×2): 10 mg via ORAL
  Filled 2022-03-04 (×2): qty 1

## 2022-03-04 NOTE — Progress Notes (Signed)
Mobility Specialist Progress Note: ? ? 03/04/22 1645  ?Mobility  ?Activity Ambulated with assistance in hallway  ?Level of Assistance Contact guard assist, steadying assist  ?Assistive Device Front wheel walker  ?Distance Ambulated (ft) 150 ft  ?Activity Response Tolerated well  ?$Mobility charge 1 Mobility  ? ?Pt agreeable to mobility session. Required CGA for steadying, pt constantly running into objects on L side of hallway. Otherwise slow, steady gait. Pt left sitting up in chair with all needs met.  ? ?Nelta Numbers ?Acute Rehab ?Phone: [phone] ?Office Phone: [phone] ? ?

## 2022-03-04 NOTE — Care Management Important Message (Signed)
Important Message ? ?Patient Details  ?Name: Amy [name] ?MRN: [id number] ?Date of Birth: 12/15/1945 ? ? ?Medicare Important Message Given:  Yes ? ? ? ? ?Shelda Altes ?03/04/2022, 7:46 AM ?

## 2022-03-04 NOTE — Progress Notes (Addendum)
? ? ?Advanced Heart Failure Rounding Note ? ? ?Subjective:   ? ?Cath 4/14  with totally occluded RCA with RV infarct physiology and severe TR. ?4/16 Given IV lasix. I/O not accurate.  ?4/17 DBA weaned to 1 mcg . Yesterday switched to torsemide and spiro increased. .  ? ?Remains on DBA 1   co-ox 62%  ? ? ?Feeling  better. Denies SOB.  ? ? ?Objective:   ?Weight Range: ? ?Vital Signs:   ?Temp:  [97.4 ?F (36.3 ?C)-98.2 ?F (36.8 ?C)] 97.7 ?F (36.5 ?C) (04/18 0745) ?Pulse Rate:  [82-95] 95 (04/18 0745) ?Resp:  [20-23] 23 (04/18 0745) ?BP: (118-168)/(52-111) 168/111 (04/18 0745) ?SpO2:  [92 %-95 %] 94 % (04/18 0745) ?Weight:  [83 kg] 83 kg (04/18 0735) ?Last BM Date : 03/03/22 ? ?Weight change: ?Filed Weights  ? 03/02/22 0552 03/03/22 0005 03/04/22 0735  ?Weight: 84.8 kg 84 kg 83 kg  ? ? ?Intake/Output:  ? ?Intake/Output Summary (Last 24 hours) at 03/04/2022 0756 ?Last data filed at 03/04/2022 0500 ?Gross per 24 hour  ?Intake 247 ml  ?Output 1450 ml  ?Net -1203 ml  ?  ?CVP 10-11.  ?Physical Exam: ?General: Sitting on the side of the bed. No resp difficulty ?HEENT: normal ?Neck: supple. JVP 9-10 . Carotids 2+ bilat; no bruits. No lymphadenopathy or thryomegaly appreciated. ?Cor: PMI nondisplaced. Regular rate & rhythm. No rubs, gallops. 2/6 TR  ?Lungs: clear ?Abdomen: soft, nontender, nondistended. No hepatosplenomegaly. No bruits or masses. Good bowel sounds. ?Extremities: no cyanosis, clubbing, rash, edema ?Neuro: alert & orientedx3, cranial nerves grossly intact. moves all 4 extremities w/o difficulty. Affect pleasant ? ?Telemetry: SR 80-90s  ?Labs: ?Basic Metabolic Panel: ?Recent Labs  ?Lab 02/26/22 ?0050 02/26/22 ?7510 02/28/22 ?0450 02/28/22 ?0920 02/28/22 ?2585 03/01/22 ?0402 03/02/22 ?2778 03/03/22 ?2423 03/04/22 ?5361  ?NA 132*   < > 131*   < > 136  137 134* 135 136 137  ?K 4.0   < > 4.1   < > 4.3  4.1 4.9 4.4 4.1 4.3  ?CL 93*   < > 99  --   --  104 101 100 101  ?CO2 22   < > 25  --   --  '25 28 28 29  '$ ?GLUCOSE  150*   < > 127*  --   --  138* 122* 123* 116*  ?BUN 65*   < > 37*  --   --  32* 27* 22 21  ?CREATININE 2.01*   < > 0.94  --   --  0.84 0.82 0.79 0.74  ?CALCIUM 8.9   < > 8.0*  --   --  7.9* 7.5* 7.6* 7.6*  ?MG 3.4*  --   --   --   --   --   --   --   --   ? < > = values in this interval not displayed.  ? ? ?Liver Function Tests: ?Recent Labs  ?Lab 02/26/22 ?4431 02/27/22 ?5400 02/28/22 ?0450 03/01/22 ?0402 03/02/22 ?8676  ?AST 140* 130* 79* 68* 61*  ?ALT 95* 236* 236* 212* 187*  ?ALKPHOS 19 50 93 26 71  ?BILITOT 0.7 0.6 0.7 0.5 0.6  ?PROT 7.2 6.5 6.4* 6.3* 6.4*  ?ALBUMIN 3.4* 2.8* 2.7* 2.5* 2.5*  ? ?No results for input(s): LIPASE, AMYLASE in the last 168 hours. ?No results for input(s): AMMONIA in the last 168 hours. ? ?CBC: ?Recent Labs  ?Lab 02/26/22 ?0050 02/26/22 ?2458 02/27/22 ?0998 02/27/22 ?2000 02/28/22 ?3382 02/28/22 ?5053 02/28/22 ?9767  03/01/22 ?0402  ?WBC 12.0*  --  11.4* 11.4* 10.8*  --   --  10.9*  ?NEUTROABS 7.9*  --   --   --   --   --   --   --   ?HGB 11.8*   < > 9.9* 9.9* 10.0* 7.8* 9.5*  9.5* 9.7*  ?HCT 35.8*   < > 29.1* 28.9* 28.9* 23.0* 28.0*  28.0* 28.2*  ?MCV 99.2  --  96.0 97.0 96.7  --   --  97.6  ?PLT 199  --  200 199 197  --   --  208  ? < > = values in this interval not displayed.  ? ? ?Cardiac Enzymes: ?No results for input(s): CKTOTAL, CKMB, CKMBINDEX, TROPONINI in the last 168 hours. ? ?BNP: ?BNP (last 3 results) ?Recent Labs  ?  10/15/21 ?0629 10/17/21 ?1220 02/26/22 ?0050  ?BNP 991.7* 363.8* 905.5*  ? ? ?ProBNP (last 3 results) ?No results for input(s): PROBNP in the last 8760 hours. ? ? ? ?Other results: ? ?Imaging: ?No results found. ? ? ?Medications:   ? ? ?Scheduled Medications: ? aspirin EC  81 mg Oral Daily  ? atorvastatin  80 mg Oral Daily  ? Chlorhexidine Gluconate Cloth  6 each Topical Daily  ? clopidogrel  75 mg Oral Daily  ? digoxin  0.125 mg Oral Daily  ? enoxaparin (LOVENOX) injection  40 mg Subcutaneous Q24H  ? insulin aspart  0-15 Units Subcutaneous TID WC  ?  loratadine  10 mg Oral Daily  ? polyethylene glycol  17 g Oral Daily  ? sodium chloride flush  10-40 mL Intracatheter Q12H  ? spironolactone  25 mg Oral Daily  ? torsemide  40 mg Oral Daily  ? ? ?Infusions: ? DOBUTamine 1 mcg/kg/min (03/03/22 0830)  ? ? ?PRN Medications: ?acetaminophen, alum & mag hydroxide-simeth, guaiFENesin-dextromethorphan, ondansetron (ZOFRAN) IV, polyvinyl alcohol, sodium chloride flush ? ? ?Assessment/Plan:  ? ?1. 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 Has totally occluded RCA which has been managed medically ?-RHC hemodynamics on 2.5 DBA c/w severe RV failure and severe TR - RA 19 (prominent v waves), PCWP 5, Fick CO 4.1/Index 2.3, PAPi 2.8 ?- Echo as below ?- CO-OX stable. Stop DBA ?- ?2. CAD with Inferior STEMI: ?-Late presentation MI.  ?-LHC: 100% RCA, 60% LM, 50% Lcx, 60% p to m LAD, 90% Ramus. Will manage CAD medically ?-Continue DAPT/statin ?-No beta blocker with shock/RV failure ?  ?3. Acute biventricular heart failure: ?-EF down to 30-35% in 12/22 in setting of COVID-19 infection. LV function subsequently recovered, EF up to 50-55% 03/23. ?-Echo 04/23: EF 30-35%, RV severely reduced, moderate to severe TR, moderate to severe aortic valve stenosis with mean gradient 12 mmHg and AVA 0.85 cm2 ?-RA elevated at 19 but PCWP only 5.  ?- CO-OX stable. Stop DBA,  ?- CVP 10-11. Continue torsemide 40 mg daily.  ?- Continue digoxin ?- Continue spiro to 25 mg daily ?- No beta blocker with CGS ?- Start farxiga 10 mg daily  ?- Renal function stable.  ?  ?4. Tricuspid Regurgitation: ?-Moderate to severe on echo ?-RHC consistent with severe TR ?-Medical management for now. Hopefully will improve as RV improves otherwise can cosndier clip ?  ?5. Aortic valve stenosis: ?-Mild to moderate ?  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 EP has seen, no indications for pacemaker. It was felt that this would likely recovery with  time. ?  ?7. AKI: ?-Scr up 2.2 in setting of shock. Resolved with inotrope support. ?-Daily BMET ?  ?8. Acute liver injury: ?-2/2 shock ?-Improving with hemodynamic support ?  ?9. Prediabetes: ?-A1c 6.3%. ?-Consider SGLT2i prior to discharge but watch for UTI with morbid obesity ?  ?10. GOC: ?Palliative care consulted. She is now DNR. ? ?Length of Stay: 6 ? ? ?Amy Clegg NP-C  ?03/04/2022, 7:56 AM ? ?Advanced Heart Failure Team ?Pager [phone] (M-F; 7a - 4p)  ?Please contact [facility] for night-coverage after hours (4p -7a ) and weekends on [email or web address] ? ?Patient seen and examined with the above-signed Advanced Practice Provider and/or Housestaff. I personally reviewed laboratory data, imaging studies and relevant notes. I independently examined the patient and formulated the important aspects of the plan. I have edited the note to reflect any of my changes or salient points. I have personally discussed the plan with the patient and/or family. ? ?Remains on DBA at 1. CO-ox 62%. Diuresing well. CVP 10-11. Feels stronger today. Able to walk with mobility specialist with only minimal dyspnea. No CP.  ? ?General:  Obese woman. No resp difficulty ?HEENT: normal ?Neck: supple.JVP 10  Carotids 2+ bilat; no bruits. No lymphadenopathy or thryomegaly appreciated. ?Cor: PMI nondisplaced. Regular rate & rhythm. 2/6 AS/TR ?Lungs: clear ?Abdomen: obese soft, nontender, nondistended. No hepatosplenomegaly. No bruits or masses. Good bowel sounds. ?Extremities: no cyanosis, clubbing, rash, tr-1+ edema ?Neuro: alert & orientedx3, cranial nerves grossly intact. moves all 4 extremities w/o difficulty. Affect pleasant ? ?Continues to improve. Will stop DBA today and follow co-ox. Agree with starting SGLT2i. Be careful not to overdiurese with RV failure.  ? ?Glori Bickers, MD  ?9:17 PM ? ? ? ?

## 2022-03-05 ENCOUNTER — Other Ambulatory Visit (HOSPITAL_COMMUNITY): Payer: Self-pay

## 2022-03-05 DIAGNOSIS — I2111 ST elevation (STEMI) myocardial infarction involving right coronary artery: Secondary | ICD-10-CM | POA: Diagnosis not present

## 2022-03-05 DIAGNOSIS — I50810 Right heart failure, unspecified: Secondary | ICD-10-CM | POA: Diagnosis not present

## 2022-03-05 LAB — BASIC METABOLIC PANEL
Anion gap: 7 (ref 5–15)
BUN: 20 mg/dL (ref 8–23)
CO2: 28 mmol/L (ref 22–32)
Calcium: 8 mg/dL — ABNORMAL LOW (ref 8.9–10.3)
Chloride: 99 mmol/L (ref 98–111)
Creatinine, Ser: 0.86 mg/dL (ref 0.44–1.00)
GFR, Estimated: >60 mL/min (ref >60)
Glucose, Bld: 114 mg/dL — ABNORMAL HIGH (ref 70–99)
Potassium: 4.3 mmol/L (ref 3.5–5.1)
Sodium: 134 mmol/L — ABNORMAL LOW (ref 135–145)

## 2022-03-05 LAB — CBC
HCT: 31.4 % — ABNORMAL LOW (ref 36.0–46.0)
Hemoglobin: 10.1 g/dL — ABNORMAL LOW (ref 12.0–15.0)
MCH: 32 pg (ref 26.0–34.0)
MCHC: 32.2 g/dL (ref 30.0–36.0)
MCV: 99.4 fL (ref 80.0–100.0)
Platelets: 343 10*3/uL (ref 150–400)
RBC: 3.16 MIL/uL — ABNORMAL LOW (ref 3.87–5.11)
RDW: 14.2 % (ref 11.5–15.5)
WBC: 10.4 10*3/uL (ref 4.0–10.5)
nRBC: 0 % (ref 0.0–0.2)

## 2022-03-05 LAB — COOXEMETRY PANEL
Carboxyhemoglobin: 3.5 % — ABNORMAL HIGH (ref 0.5–1.5)
Methemoglobin: 0.9 % (ref 0.0–1.5)
O2 Saturation: 66.1 %
Total hemoglobin: 10.1 g/dL — ABNORMAL LOW (ref 12.0–16.0)

## 2022-03-05 LAB — GLUCOSE, CAPILLARY
Glucose-Capillary: 131 mg/dL — ABNORMAL HIGH (ref 70–99)
Glucose-Capillary: 96 mg/dL (ref 70–99)
Glucose-Capillary: 184 mg/dL — ABNORMAL HIGH (ref 70–99)

## 2022-03-05 LAB — DIGOXIN LEVEL: Digoxin Level: 0.3 ng/mL — ABNORMAL LOW (ref 0.8–2.0)

## 2022-03-05 MED ORDER — TORSEMIDE 20 MG PO TABS
20.0000 mg | ORAL_TABLET | Freq: Every day | ORAL | 5 refills | Status: DC
  Filled 2022-03-05: qty 31, 31d supply, fill #0

## 2022-03-05 MED ORDER — DIGOXIN 125 MCG PO TABS
0.1250 mg | ORAL_TABLET | Freq: Every day | ORAL | 5 refills | Status: DC
  Filled 2022-03-05: qty 31, 31d supply, fill #0

## 2022-03-05 MED ORDER — CLOPIDOGREL BISULFATE 75 MG PO TABS
75.0000 mg | ORAL_TABLET | Freq: Every day | ORAL | 5 refills | Status: DC
  Filled 2022-03-05: qty 31, 31d supply, fill #0

## 2022-03-05 MED ORDER — EMPAGLIFLOZIN 10 MG PO TABS
10.0000 mg | ORAL_TABLET | Freq: Every day | ORAL | 1 refills | Status: DC
  Filled 2022-03-05: qty 31, 31d supply, fill #0

## 2022-03-05 MED ORDER — SPIRONOLACTONE 25 MG PO TABS
25.0000 mg | ORAL_TABLET | Freq: Every day | ORAL | 0 refills | Status: DC
  Filled 2022-03-05: qty 31, 31d supply, fill #0

## 2022-03-05 NOTE — Progress Notes (Signed)
Mobility Specialist Progress Note: ? ? 03/05/22 1020  ?Mobility  ?Activity Ambulated with assistance in hallway  ?Level of Assistance Standby assist, set-up cues, supervision of patient - no hands on  ?Assistive Device Front wheel walker  ?Distance Ambulated (ft) 300 ft  ?Activity Response Tolerated well  ?$Mobility charge 1 Mobility  ? ?Pt agreeable to mobility session after encouragement. Required minG throughout for safety. Pt asx during session. Back in bed with all needs met.  ? ?Nelta Numbers ?Acute Rehab ?Phone: [phone] ?Office Phone: [phone] ? ?

## 2022-03-05 NOTE — Discharge Summary (Addendum)
Advanced Heart Failure Team ? ?Discharge Summary  ? ?Patient ID: Amy [name] ?MRN: [id number], DOB/AGE: 25-Oct-1946 76 y.o. Admit date: 02/26/2022 ?D/C date:     03/05/2022  ? ?Primary Discharge Diagnoses:  ?Acute systolic Hf -> Cardiogenic shock ?Inferior STEMi ?Acute biventricular heart failure ?Severe tricuspid regurgitation ?Aortic valve stenosis ?Mobitz II second degree AV block ?AKI ?Acute liver injury ?Prediabetes ? ? ?Hospital Course:  ? ?76 y.o. 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  ?  ?Presented on 02/26/22 with late presentation inferior STEMI. Symptoms started a week prior to admission. Not taken for urgent cardiac cath as she completed her MI and had AKI. Course has been complicated by RV infarct>>cardiogenic shock. 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 EP consulted. Felt to be secondary to late presentation MI. It was felt that conduction would improve and no need for permanent pacemaker.  ?  ?Echo: EF 30-35%, RV severely dilated with severely reduced function, inferolateral akinesis, moderate to severe TR, moderate to severe AS ?  ?She was given IV fluids and started on DBA for RV support.  Scr has improved 0.7>2.0>2.1>1.24>0.94. ?  ?R/LHC 04/14 (on DBA 2.5): 100% RCA, 60% LM, 50% Lcx, 60% p to m LAD, 90% Ramus, RA 19 (with prominent v waves), PA 27/11 (19), PCWP 5, Fick CO 4.1/Index 2.3, PA sat 43%/49%, PAPi 0.8 ? ?Post cath diuresed with low-dose PO diuretics. Initiated on GDMT as tolerated. Inotrope support weaned off slowly. She continued to progress well and CO-OX remained stable off DBA. ? ?[facility] PT/OT arranged at discharge ? ?Has f/u in HF clinic scheduled. ? ?Please see below for hospital course by problem. ? ?Hospital Course by Problem: ? 1. 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 Has totally occluded RCA which has been  managed medically ?-RHC hemodynamics on 2.5 DBA c/w severe RV failure and severe TR - RA 19 (prominent v waves), PCWP 5, Fick CO 4.1/Index 2.3, PAPi 2.8 ?- Echo as below ?- CO-OX stable off DBA ?  ?2. CAD with Inferior STEMI: ?-Late presentation MI.  ?-LHC: 100% RCA, 60% LM, 50% Lcx, 60% p to m LAD, 90% Ramus. Will manage CAD medically. ?-Continue DAPT/statin ?-No beta blocker with shock/RV failure ?  ?3. Acute biventricular heart failure: ?-EF down to 30-35% in 12/22 in setting of COVID-19 infection. LV function subsequently recovered, EF up to 50-55% 03/23. ?-Echo 04/23: EF 30-35%, RV severely reduced, moderate to severe TR, moderate to severe aortic valve stenosis with mean gradient 12 mmHg and AVA 0.85 cm2 ?-RA elevated at 19 but PCWP only 5.  ?-CO-OX stable off DBA ?-JVP elevated in setting of severe TR. Volume looks good on exam. Decrease Torsemide to 20 mg daily ?-Continue digoxin. Dig level okay ?-Continue spiro 25 mg daily ?-No beta blocker with CGS ?-Continue farxiga 10 mg daily  ?-Consider ARNI at f/u ?-Renal function stable.  ?  ?4. Tricuspid Regurgitation: ?-Moderate to severe on echo ?-RHC consistent with severe TR ?-Medical management for now. Hopefully will improve as RV improves otherwise can cosndier clip ?  ?5. Aortic valve stenosis: ?-Mild to moderate ?  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 EP has seen, no indications for pacemaker. It was felt that this would likely recovery with time. ?-Resolved ?  ?7. AKI: ?-Scr up 2.2 in setting of shock.  ?-Resolved with inotrope  support. ?  ?8. Acute liver injury: ?-2/2 shock ?-Improved with hemodynamic support ?  ?9. Prediabetes: ?-A1c 6.3%. ?-Watch for UTIs with SGLT2i ?  ?10. GOC: ?Palliative care consulted. She is now DNR. ?  ? ?Discharge Weight Range: 191 lb >> 180 lb ? ?Discharge Vitals: Blood pressure 119/76, pulse 87, temperature 97.7 ?F (36.5 ?C), temperature source Oral, resp. rate 19, height '4\' 9"'$  (1.448 m), weight 82 kg, SpO2  94 %. ? ?Labs: ?Lab Results  ?Component Value Date  ? WBC 10.4 03/05/2022  ? HGB 10.1 (L) 03/05/2022  ? HCT 31.4 (L) 03/05/2022  ? MCV 99.4 03/05/2022  ? PLT 343 03/05/2022  ?  ?Recent Labs  ?Lab 03/02/22 ?8469 03/03/22 ?6295 03/05/22 ?0440  ?NA 135   < > 134*  ?K 4.4   < > 4.3  ?CL 101   < > 99  ?CO2 28   < > 28  ?BUN 27*   < > 20  ?CREATININE 0.82   < > 0.86  ?CALCIUM 7.5*   < > 8.0*  ?PROT 6.4*  --   --   ?BILITOT 0.6  --   --   ?ALKPHOS 76  --   --   ?ALT 187*  --   --   ?AST 61*  --   --   ?GLUCOSE 122*   < > 114*  ? < > = values in this interval not displayed.  ? ?Lab Results  ?Component Value Date  ? CHOL 79 02/28/2022  ? HDL 26 (L) 02/28/2022  ? Slick 33 02/28/2022  ? TRIG 99 02/28/2022  ? ?BNP (last 3 results) ?Recent Labs  ?  10/15/21 ?0629 10/17/21 ?1220 02/26/22 ?0050  ?BNP 991.7* 363.8* 905.5*  ? ? ?ProBNP (last 3 results) ?No results for input(s): PROBNP in the last 8760 hours. ? ? ?Diagnostic Studies/Procedures  ? ? ?R/LHC on 2.5 DBA, 02/28/22: ??  Ost RCA to Prox RCA lesion is 100% stenosed. ??  Ost LM lesion is 60% stenosed. ??  Ost Cx lesion is 50% stenosed. ??  Prox LAD to Mid LAD lesion is 60% stenosed. ??  Ramus lesion is 90% stenosed. ?  ?Findings: ?On dobutamine 2.5 ?  ?Ao = 83/53 (67) ?LV = 92/14 ?RA = 19 (with prominent v- waves) ?RV = 28/18 ?PA = 27/11 (19) ?PCW = 5 ?Fick cardiac output/index = 4.1/2.3 ?PVR = 3.4 WU ?FA sat = 88% ?PA sat = 43%, 49% ?PAPi = 0.8 ?AoV gradient peak to peak 2.80mHG on pullback ?  ?Assessment: ?1. CAD with interval occlusion of ostial RCA with associate large RV infarct ?2. Borderline 60% LM lesion ?3. Mild AS ?4. Hemodynamics c/w with severe RV failure and severe TR ?  ?Plan/Discussion: ?Continue treatment of RV infarct. Will review LM lesion with IC team. Currently stable and no urgent need for revascularization. ? ? ?Echo, 02/26/22 ?IMPRESSIONS: ? 1. Left ventricular ejection fraction, by estimation, is 30 to 35%. The  ?left ventricle has moderately  decreased function. Left ventricular  ?endocardial border not optimally defined to evaluate regional wall motion.  ?There is moderate global LV dysfunction  ? but study is suggestive of inferolateral akinesis. Definity contrast  ?study is poor quality. Left ventricular diastolic parameters are  ?consistent with Grade I diastolic dysfunction (impaired relaxation). There  ?is the interventricular septum is flattened  ?in diastole ('D' shaped left ventricle), consistent with right ventricular  ?volume overload.  ? 2. Right ventricular systolic function is severely reduced. The right  ?ventricular  size is moderately enlarged. There is normal pulmonary artery  ?systolic pressure. The estimated right ventricular systolic pressure is  ?95.6 mmHg.  ? 3. Right atrial size was severely dilated.  ? 4. The mitral valve is normal in structure. Trivial mitral valve  ?regurgitation. No evidence of mitral stenosis.  ? 5. Tricuspid valve regurgitation is moderate to severe.  ? 6. The aortic valve is calcified. There is severe calcifcation of the  ?aortic valve. There is moderate thickening of the aortic valve. Aortic  ?valve regurgitation is trivial. Moderate to severe aortic valve stenosis.  ?Aortic valve area, by VTI measures  ?0.85 cm?Marland Kitchen Aortic valve mean gradient measures 12.0 mmHg. Aortic valve Vmax  ?measures 2.28 m/s.  ? 7. The inferior vena cava is dilated in size with <50% respiratory  ?variability, suggesting right atrial pressure of 15 mmHg.  ? 8. The AVA is calculated at 0.85cm2 consistent with severe AS while the  ?mean AVG is low at 40mHg and Vmax 2.25 m/s. In the setting of low DVI of  ?0.27 and low SVI of 23, suspect that this patient has at leaste moderate  ?and possibly moderate to severe  ?low flow low gradient AS in setting of HFrEF.  ? ? ? ?Discharge Medications  ? ?Allergies as of 03/05/2022   ? ?   Reactions  ? Timolol Shortness Of Breath  ? Codeine Other (See Comments)  ? sick  ? Morphine And Related Nausea  And Vomiting  ? Other Other (See Comments)  ? vicryl sutures - rash/redness, delay in healing  ? ?  ? ?  ?Medication List  ?  ? ?STOP taking these medications   ? ?ascorbic acid 500 MG tablet ?Commonly known as:

## 2022-03-05 NOTE — Progress Notes (Signed)
OT Cancellation Note ? ?Patient Details ?Name: Amy [name] ?MRN: [id number] ?DOB: [birth year] ? ? ?Cancelled Treatment:    Reason Eval/Treat Not Completed: Patient declined, no reason specified;Other (comment) pt reports she's already been OOB multiple times today and declined OT session. Will check back as time allows for OT session. ? ?Corinne Ports K., COTA/L ?Acute Rehabilitation Services ?[phone] ? ? ?Precious Haws ?03/05/2022, 12:09 PM ?

## 2022-03-05 NOTE — Progress Notes (Signed)
PT Cancellation Note ? ?Patient Details ?Name: Amy [name] ?MRN: [id number] ?DOB: May 29, 1946 ? ? ?Cancelled Treatment:    Reason Eval/Treat Not Completed: Patient declined, no reason specified. Not interested in working with therapy this AM. States she is leaving later today. ? ? ?Shary Decamp [facility] ?03/05/2022, 9:16 AM ?Suanne Marker PT ?Acute Rehabilitation Services ?Office [phone] ? ?

## 2022-03-05 NOTE — Progress Notes (Signed)
Assumed care of this patient at 1500. Previous RN has reviewed discharge instructions with patient. Currently waiting for daughter to arrive with clothing for patient and transport home. ?

## 2022-03-05 NOTE — Progress Notes (Signed)
This nurse educated patient on procedure. HOB less than 45*. Pt held breath on line removal and pressure held for 5+ min with no s/sx of bleeding. Pressure drsg applied and instructed pt to keep drsg CDI for 24 hrs. Monitoring for s/sx of bleeding, apply pressure and report to nurse. VU. Fran Lowes, RN VAST ?

## 2022-03-05 NOTE — TOC Transition Note (Addendum)
Transition of Care (TOC) - CM/SW Discharge Note ? ? ?Patient Details  ?Name: Amy [name] ?MRN: [id number] ?Date of Birth: 05/19/1946 ? ?Transition of Care (TOC) CM/SW Contact:  ?Zenon Mayo, RN ?Phone Number: ?03/05/2022, 1:25 PM ? ? ?Clinical Narrative:    ?Patient is for dc today, patient is set up with [facility] per previous NCM .  NCM notified Marjory Lies with [facility] of dc today.  Patient states her daughter will transport her home today. TOC to fill meds prior to dc. ? ? ?  ?Barriers to Discharge: Continued Medical Work up ? ? ?Patient Goals and CMS Choice ?Patient states their goals for this hospitalization and ongoing recovery are:: wants to get well ?CMS Medicare.gov Compare Post Acute Care list provided to:: Patient ?Choice offered to / list presented to : Patient ? ?Discharge Placement ?  ?           ?  ?  ?  ?  ? ?Discharge Plan and Services ?  ?Discharge Planning Services: CM Consult ?Post Acute Care Choice: Home Health          ?  ?  ?  ?  ?  ?HH Arranged: PT ?[place] Agency: [facility] ?Date HH Agency Contacted: 03/03/22 ?Time [place]: [id number] ?Representative spoke with at [facility]: Romie Jumper ? ?Social Determinants of Health (SDOH) Interventions ?  ? ? ?Readmission Risk Interventions ?   ? View : No data to display.  ?  ?  ?  ? ? ? ? ? ?

## 2022-03-05 NOTE — Progress Notes (Addendum)
? ? ?Advanced Heart Failure Rounding Note ? ? ?Subjective:   ? ?Cath 4/14  with totally occluded RCA with RV infarct physiology and severe TR. ?4/18 DBA off  ? ?CO-OX 66% off DBA ? ?CVP not set up ? ?Renal function and blood pressure stable. ? ?Feeling well. No dyspnea, orthopnea or PND. Ready to go home. ? ? ?Objective:   ?Weight Range: 191 >> 180 lb ? ?Vital Signs:   ?Temp:  [97.2 ?F (36.2 ?C)-98.1 ?F (36.7 ?C)] 97.7 ?F (36.5 ?C) (04/19 1134) ?Pulse Rate:  [84-97] 84 (04/19 1134) ?Resp:  [15-21] 19 (04/19 1134) ?BP: (114-123)/(63-96) 114/63 (04/19 1134) ?SpO2:  [92 %-95 %] 95 % (04/19 1134) ?Weight:  [82 kg] 82 kg (04/19 0715) ?Last BM Date : 03/05/22 ? ?Weight change: ?Filed Weights  ? 03/03/22 0005 03/04/22 0735 03/05/22 0715  ?Weight: 84 kg 83 kg 82 kg  ? ? ?Intake/Output:  ? ?Intake/Output Summary (Last 24 hours) at 03/05/2022 1235 ?Last data filed at 03/05/2022 1134 ?Gross per 24 hour  ?Intake 1351 ml  ?Output 1525 ml  ?Net -174 ml  ?  ? ?Physical Exam: ?General:  Sitting up in bed. No distress. ?HEENT: normal ?Neck: supple. JVP 10-12 cm. Carotids 2+ bilat; no bruits.  ?Cor: PMI nondisplaced. Regular rate & rhythm. No rubs, gallops, 2/6 TR murmur ?Lungs: clear ?Abdomen: soft, nontender, nondistended. No hepatosplenomegaly. ?Extremities: no cyanosis, clubbing, rash, trace edema ?Neuro: alert & orientedx3, cranial nerves grossly intact. moves all 4 extremities w/o difficulty. Affect pleasant ? ? ?Telemetry: SR 80s-90s ? ?Labs: ?Basic Metabolic Panel: ?Recent Labs  ?Lab 03/01/22 ?0402 03/02/22 ?9371 03/03/22 ?6967 03/04/22 ?8938 03/05/22 ?0440  ?NA 134* 135 136 137 134*  ?K 4.9 4.4 4.1 4.3 4.3  ?CL 104 101 100 101 99  ?CO2 '25 28 28 29 28  '$ ?GLUCOSE 138* 122* 123* 116* 114*  ?BUN 32* 27* '22 21 20  '$ ?CREATININE 0.84 0.82 0.79 0.74 0.86  ?CALCIUM 7.9* 7.5* 7.6* 7.6* 8.0*  ? ? ?Liver Function Tests: ?Recent Labs  ?Lab 02/27/22 ?0620 02/28/22 ?0450 03/01/22 ?0402 03/02/22 ?1017  ?AST 130* 79* 68* 61*  ?ALT 236* 236*  212* 187*  ?ALKPHOS 51 02 58 52  ?BILITOT 0.6 0.7 0.5 0.6  ?PROT 6.5 6.4* 6.3* 6.4*  ?ALBUMIN 2.8* 2.7* 2.5* 2.5*  ? ?No results for input(s): LIPASE, AMYLASE in the last 168 hours. ?No results for input(s): AMMONIA in the last 168 hours. ? ?CBC: ?Recent Labs  ?Lab 02/27/22 ?7782 02/27/22 ?2000 02/28/22 ?4235 02/28/22 ?3614 02/28/22 ?4315 03/01/22 ?4008 03/05/22 ?0440  ?WBC 11.4* 11.4* 10.8*  --   --  10.9* 10.4  ?HGB 9.9* 9.9* 10.0* 7.8* 9.5*  9.5* 9.7* 10.1*  ?HCT 29.1* 28.9* 28.9* 23.0* 28.0*  28.0* 28.2* 31.4*  ?MCV 96.0 97.0 96.7  --   --  97.6 99.4  ?PLT 200 199 197  --   --  208 343  ? ? ?Cardiac Enzymes: ?No results for input(s): CKTOTAL, CKMB, CKMBINDEX, TROPONINI in the last 168 hours. ? ?BNP: ?BNP (last 3 results) ?Recent Labs  ?  10/15/21 ?0629 10/17/21 ?1220 02/26/22 ?0050  ?BNP 991.7* 363.8* 905.5*  ? ? ?ProBNP (last 3 results) ?No results for input(s): PROBNP in the last 8760 hours. ? ? ? ?Other results: ? ?Imaging: ?No results found. ? ? ?Medications:   ? ? ?Scheduled Medications: ? aspirin EC  81 mg Oral Daily  ? atorvastatin  80 mg Oral Daily  ? Chlorhexidine Gluconate Cloth  6 each Topical Daily  ?  clopidogrel  75 mg Oral Daily  ? dapagliflozin propanediol  10 mg Oral Daily  ? digoxin  0.125 mg Oral Daily  ? enoxaparin (LOVENOX) injection  40 mg Subcutaneous Q24H  ? insulin aspart  0-15 Units Subcutaneous TID WC  ? loratadine  10 mg Oral Daily  ? polyethylene glycol  17 g Oral Daily  ? sodium chloride flush  10-40 mL Intracatheter Q12H  ? spironolactone  25 mg Oral Daily  ? torsemide  40 mg Oral Daily  ? ? ?Infusions: ? ? ? ?PRN Medications: ?acetaminophen, alum & mag hydroxide-simeth, guaiFENesin-dextromethorphan, ondansetron (ZOFRAN) IV, polyvinyl alcohol, sodium chloride flush ? ? ?Assessment/Plan:  ? ?1. 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 Has totally occluded RCA which has been managed medically ?-RHC hemodynamics on 2.5 DBA  c/w severe RV failure and severe TR - RA 19 (prominent v waves), PCWP 5, Fick CO 4.1/Index 2.3, PAPi 2.8 ?- Echo as below ?- CO-OX stable off DBA ? ?2. CAD with Inferior STEMI: ?-Late presentation MI.  ?-LHC: 100% RCA, 60% LM, 50% Lcx, 60% p to m LAD, 90% Ramus. Will manage CAD medically. ?-Continue DAPT/statin ?-No beta blocker with shock/RV failure ?  ?3. Acute biventricular heart failure: ?-EF down to 30-35% in 12/22 in setting of COVID-19 infection. LV function subsequently recovered, EF up to 50-55% 03/23. ?-Echo 04/23: EF 30-35%, RV severely reduced, moderate to severe TR, moderate to severe aortic valve stenosis with mean gradient 12 mmHg and AVA 0.85 cm2 ?-RA elevated at 19 but PCWP only 5.  ?-CO-OX stable off DBA ?-JVP elevated in setting of severe TR. Volume looks good on exam. Decrease Torsemide to 20 mg daily ?-Continue digoxin. Dig level okay ?-Continue spiro 25 mg daily ?-No beta blocker with CGS ?-Continue farxiga 10 mg daily  ?-Consider ARNI at f/u ?-Renal function stable.  ?  ?4. Tricuspid Regurgitation: ?-Moderate to severe on echo ?-RHC consistent with severe TR ?-Medical management for now. Hopefully will improve as RV improves otherwise can cosndier clip ?  ?5. Aortic valve stenosis: ?-Mild to moderate ?  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 EP has seen, no indications for pacemaker. It was felt that this would likely recovery with time. ?-Resolved ?  ?7. AKI: ?-Scr up 2.2 in setting of shock.  ?-Resolved with inotrope support. ?  ?8. Acute liver injury: ?-2/2 shock ?-Improved with hemodynamic support ?  ?9. Prediabetes: ?-A1c 6.3%. ?-Watch for UTIs with SGLT2i ?  ?10. GOC: ?Palliative care consulted. She is now DNR. ? ? ? ?Okay for discharge this afternoon. HH PT/OT arranged. ? ? ?Length of Stay: 7 ? ? ?FINCH, LINDSAY N NP-C  ?03/05/2022, 12:35 PM ? ?Advanced Heart Failure Team ?Pager [phone] (M-F; 7a - 4p)  ?Please contact [facility] for night-coverage after hours (4p  -7a ) and weekends on [email or web address] ? ? ?Patient seen and examined with the above-signed Advanced Practice Provider and/or Housestaff. I personally reviewed laboratory data, imaging studies and relevant notes. I independently examined the patient and formulated the important aspects of the plan. I have edited the note to reflect any of my changes or salient points. I have personally discussed the plan with the patient and/or family. ? ?Off DBA. Co-ox stable. Feels good. Denies CP or SOB. Walking with mobility team. Wants to go home.  ? ?General:  Sitting up on side of bed. No resp difficulty ?HEENT: normal ?Neck: supple. no JVD. Carotids 2+  bilat; no bruits. No lymphadenopathy or thryomegaly appreciated. ?Cor: PMI nondisplaced. Regular rate & rhythm. No rubs, gallops or murmurs. ?Lungs: clear ?Abdomen: obese  soft, nontender, nondistended. No hepatosplenomegaly. No bruits or masses. Good bowel sounds. ?Extremities: no cyanosis, clubbing, rash, edema ?Neuro: alert & orientedx3, cranial nerves grossly intact. moves all 4 extremities w/o difficulty. Affect pleasant ? ? ?Ok for d/s today. Meds adjusted as above. Will need close f/u in HF Clinic. Repeat echo in 2 months to re-evaluate RV and TR.  ? ?Glori Bickers, MD  ?2:32 PM ? ? ?

## 2022-03-06 ENCOUNTER — Ambulatory Visit (HOSPITAL_BASED_OUTPATIENT_CLINIC_OR_DEPARTMENT_OTHER): Payer: Medicare HMO | Admitting: Cardiology

## 2022-03-07 DIAGNOSIS — I5042 Chronic combined systolic (congestive) and diastolic (congestive) heart failure: Secondary | ICD-10-CM | POA: Diagnosis not present

## 2022-03-07 DIAGNOSIS — I428 Other cardiomyopathies: Secondary | ICD-10-CM | POA: Diagnosis not present

## 2022-03-07 DIAGNOSIS — I441 Atrioventricular block, second degree: Secondary | ICD-10-CM | POA: Diagnosis not present

## 2022-03-07 DIAGNOSIS — I2510 Atherosclerotic heart disease of native coronary artery without angina pectoris: Secondary | ICD-10-CM | POA: Diagnosis not present

## 2022-03-07 DIAGNOSIS — I110 Hypertensive heart disease with heart failure: Secondary | ICD-10-CM | POA: Diagnosis not present

## 2022-03-07 DIAGNOSIS — R7303 Prediabetes: Secondary | ICD-10-CM | POA: Diagnosis not present

## 2022-03-07 DIAGNOSIS — E785 Hyperlipidemia, unspecified: Secondary | ICD-10-CM | POA: Diagnosis not present

## 2022-03-07 DIAGNOSIS — N179 Acute kidney failure, unspecified: Secondary | ICD-10-CM | POA: Diagnosis not present

## 2022-03-07 DIAGNOSIS — I213 ST elevation (STEMI) myocardial infarction of unspecified site: Secondary | ICD-10-CM | POA: Diagnosis not present

## 2022-03-13 ENCOUNTER — Other Ambulatory Visit (HOSPITAL_COMMUNITY): Payer: Self-pay

## 2022-03-13 ENCOUNTER — Telehealth (HOSPITAL_COMMUNITY): Payer: Self-pay

## 2022-03-13 NOTE — Telephone Encounter (Signed)
Pharmacy Transitions of Care Follow-up Telephone Call ? ?Date of discharge: 03/05/2022  ?Discharge Diagnosis: STEMI ? ?How have you been since you were released from the hospital? Patient reports feeling fine since discharge and she reports no complaints. ? ?Medication changes made at discharge: ?START taking: ?clopidogrel (PLAVIX)  ?digoxin (LANOXIN)  ?Jardiance (empagliflozin)  ?spironolactone (ALDACTONE)  ? CHANGE how you take: ?Multi-Vitamin  ?STOP taking: ?ascorbic acid 500 MG tablet (VITAMIN C)  ?CALCIUM PO  ?cholecalciferol 25 MCG (1000 UNIT) tablet (VITAMIN D3)  ?CHROMIUM PO  ?Cinnamon 500 MG capsule  ?Fish Oil 1000 MG Caps  ?Flax Seed Oil 1000 MG Caps  ?Ginger 500 MG Caps  ?L-CARNITINE PO  ?losartan 25 MG tablet (COZAAR)  ?metoprolol succinate 25 MG 24 hr tablet (TOPROL-XL)  ?MSM PO  ?vitamin E 1000 UNIT capsule  ?Zinc 25 MG Tabs  ? ?Medication changes verified by the patient? Patient states she is taking everything as listed on her discharge sheet. ?  ? ?Medication Accessibility: ? ?Home Pharmacy: [facility]  ? ?Was the patient provided with refills on discharged medications? Yes  ? ?Have all prescriptions been transferred from [facility] to home pharmacy? Yes  ? ?Is the patient able to afford medications? Yes ?  ? ?Medication Review: ? ?CLOPIDOGREL (PLAVIX) ?Clopidogrel 75 mg once daily.  ?Per MD, CAD to be medically managed with DAPT therapy. ?- Reviewed potential DDIs with patient  ?- Advised patient of medications to avoid (NSAIDs, ASA)  ?- Educated that Tylenol (acetaminophen) will be the preferred analgesic to prevent risk of bleeding  ?- Emphasized importance of monitoring for signs and symptoms of bleeding (abnormal bruising, prolonged bleeding, nose bleeds, bleeding from gums, discolored urine, black tarry stools)  ?- Advised patient to alert all providers of anticoagulation therapy prior to starting a new medication or having a procedure  ?Follow-up Appointments: ? ?Patient scheduled  to see [facility] on 03/17/2022 at 3:30pm. ? ?If their condition worsens, is the pt aware to call PCP or go to the Emergency Dept.? Yes ? ?Final Patient Assessment: ?-Pt is doing well.  ?-Pt verbalized understanding of clopidogrel.  ?-Declined patient education ?-Pt has post discharge appointment and refill sent to [facility] ? ? ?

## 2022-03-14 ENCOUNTER — Other Ambulatory Visit (HOSPITAL_COMMUNITY): Payer: Self-pay

## 2022-03-14 NOTE — Progress Notes (Signed)
? ?ADVANCED HF CLINIC CONSULT NOTE ? ?Primary Care: Shirline Frees, MD ?HF Cardiologist: Dr. Haroldine Laws ? ?HPI: ?Amy [name] is a 76 y.o.[place] female with history of HFrEF in setting of COVID-19 infection with recovered EF, aortic valve stenosis, HLD, GERD, prediabetes, obesity.  ?  ?Admitted 4/23 with late presentation inferior STEMI. Not taken for urgent cardiac cath as she completed her MI and had AKI. Course complicated by RV infarct>>cardiogenic shock. 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 EP consulted and felt to be secondary to late presentation MI. Felt conduction would improve and no need for permanent pacemaker. Echo showed EF 30-35%, RV severely dilated with severely reduced function, moderate to severe TR, moderate to severe AS. She was given IV fluids and started on DBA for RV support.  Scr improved and underwent R/LHC showing (on DBA 2.5): 100% RCA, 60% LM, 50% Lcx, 60% p to m LAD, 90% Ramus, RA 19 (with prominent v waves), PA 27/11 (19), PCWP 5, Fick CO 4.1/Index 2.3, PA sat 43%/49%, PAPi 0.8. Palliative consulted and she remained DNR. DBA weaned off and GDMT titrated. Discharged home with HH, weight 180 lbs. ? ?Today she returns for post hospital HF follow up. Overall feeling fine, just fatigued. No SOB with ADLs or walking on flat ground. Doing OK with HH PT 2x/week. Denies palpitations, CP, dizziness, edema, or PND/Orthopnea. Appetite ok. No fever or chills. Not weighing at home. Taking all medications.   ? ?Cardiac Studies: ?- Echo (4/23): EF 30-35%, RV severely dilated with severely reduced function, inferolateral akinesis, moderate to severe TR, moderate to severe AS.  ? ?- R/LHC (4/23): CAD with interval occlusion of ostial RCA with associate large RV infarct, boderline 60% LM lesion, mild AS, hemodynamics c/w with severe RV failure and severe TR ? ?  Ost RCA to Prox RCA lesion is 100% stenosed. ?  Ost LM lesion is 60% stenosed. ?  Ost Cx lesion is 50% stenosed. ?  Prox LAD to  Mid LAD lesion is 60% stenosed. ?  Ramus lesion is 90% stenosed. ? ?On dobutamine 2.5 ?  ?Ao = 83/53 (67) ?LV = 92/14 ?RA = 19 (with prominent v- waves) ?RV = 28/18 ?PA = 27/11 (19) ?PCW = 5 ?Fick cardiac output/index = 4.1/2.3 ?PVR = 3.4 WU ?FA sat = 88% ?PA sat = 43%, 49% ?PAPi = 0.8 ?AoV gradient peak to peak 2.34mHG on pullback ?  ? ?Review of Systems: [y] = yes, '[ ]'$  = no  ? ?General: Weight gain '[ ]'$ ; Weight loss '[ ]'$ ; Anorexia '[ ]'$ ; Fatigue [y]; Fever '[ ]'$ ; Chills '[ ]'$ ; Weakness '[ ]'$   ?Cardiac: Chest pain/pressure '[ ]'$ ; Resting SOB '[ ]'$ ; Exertional SOB [Blue.Reese]; Orthopnea '[ ]'$ ; Pedal Edema '[ ]'$ ; Palpitations '[ ]'$ ; Syncope '[ ]'$ ; Presyncope '[ ]'$ ; Paroxysmal nocturnal dyspnea'[ ]'$   ?Pulmonary: Cough '[ ]'$ ; Wheezing'[ ]'$ ; Hemoptysis'[ ]'$ ; Sputum '[ ]'$ ; Snoring '[ ]'$   ?GI: Vomiting'[ ]'$ ; Dysphagia'[ ]'$ ; Melena'[ ]'$ ; Hematochezia '[ ]'$ ; Heartburn'[ ]'$ ; Abdominal pain '[ ]'$ ; Constipation '[ ]'$ ; Diarrhea '[ ]'$ ; BRBPR '[ ]'$   ?GU: Hematuria'[ ]'$ ; Dysuria '[ ]'$ ; Nocturia'[ ]'$   ?Vascular: Pain in legs with walking '[ ]'$ ; Pain in feet with lying flat '[ ]'$ ; Non-healing sores '[ ]'$ ; Stroke '[ ]'$ ; TIA '[ ]'$ ; Slurred speech '[ ]'$ ;  ?Neuro: Headaches'[ ]'$ ; Vertigo'[ ]'$ ; Seizures'[ ]'$ ; Paresthesias'[ ]'$ ;Blurred vision '[ ]'$ ; Diplopia '[ ]'$ ; Vision changes '[ ]'$   ?Ortho/Skin: Arthritis '[ ]'$ ; Joint pain '[ ]'$ ; Muscle pain '[ ]'$ ;  Joint swelling '[ ]'$ ; Back Pain '[ ]'$ ; Rash '[ ]'$   ?Psych: Depression'[ ]'$ ; Anxiety'[ ]'$   ?Heme: Bleeding problems '[ ]'$ ; Clotting disorders '[ ]'$ ; Anemia '[ ]'$   ?Endocrine: Diabetes [y]; Thyroid dysfunction'[ ]'$  ? ?Past Medical History:  ?Diagnosis Date  ? GERD (gastroesophageal reflux disease)   ? Hyperlipidemia   ? Obesity   ? Pre-diabetes   ? ?Current Outpatient Medications  ?Medication Sig Dispense Refill  ? acetaminophen (TYLENOL) 500 MG tablet Take 500 mg by mouth every 6 (six) hours as needed for mild pain or headache.    ? aspirin 81 MG EC tablet Take 81 mg by mouth every evening.    ? atorvastatin (LIPITOR) 80 MG tablet Take 1 tablet (80 mg total) by mouth daily. 30 tablet 0  ?  Carboxymethylcellulose Sodium (DRY EYE RELIEF OP) Place 1 drop into both eyes 4 (four) times daily as needed (dryness).    ? clopidogrel (PLAVIX) 75 MG tablet Take 1 tablet (75 mg total) by mouth daily. 31 tablet 5  ? Coenzyme Q10 (CO Q-10) 200 MG CAPS Take 200 mg by mouth daily.    ? Cyanocobalamin (VITAMIN B 12 PO) Take 1 tablet by mouth daily.    ? digoxin (LANOXIN) 0.125 MG tablet Take 1 tablet (0.125 mg total) by mouth daily. 31 tablet 5  ? empagliflozin (JARDIANCE) 10 MG TABS tablet Take 1 tablet (10 mg total) by mouth daily. 31 tablet 1  ? meclizine (ANTIVERT) 25 MG tablet Take 25 mg by mouth daily.    ? Multiple Vitamin (MULTI-VITAMIN) tablet Take 1 tablet by mouth daily.    ? Pramoxine-Dimethicone (GOLD BOND INTENSIVE HEALING EX) Apply 1 application topically daily as needed (eczema).    ? spironolactone (ALDACTONE) 25 MG tablet Take 1 tablet (25 mg total) by mouth daily. 31 tablet 0  ? torsemide (DEMADEX) 20 MG tablet Take 1 tablet (20 mg total) by mouth daily. 31 tablet 5  ? ?No current facility-administered medications for this encounter.  ? ?Allergies  ?Allergen Reactions  ? Timolol Shortness Of Breath  ? Codeine Other (See Comments)  ?  sick  ? Morphine And Related Nausea And Vomiting  ? Other Other (See Comments)  ?  vicryl sutures - rash/redness, delay in healing  ? ?Social History  ? ?Socioeconomic History  ? Marital status: Married  ?  Spouse name: Not on file  ? Number of children: Not on file  ? Years of education: Not on file  ? Highest education level: Not on file  ?Occupational History  ? Occupation: retired  ?Tobacco Use  ? Smoking status: Never  ? Smokeless tobacco: Never  ?Substance and Sexual Activity  ? Alcohol use: No  ? Drug use: No  ? Sexual activity: Not on file  ?Other Topics Concern  ? Not on file  ?Social History Narrative  ? Not on file  ? ?Social Determinants of Health  ? ?Financial Resource Strain: Not on file  ?Food Insecurity: Not on file  ?Transportation Needs: Not on file   ?Physical Activity: Not on file  ?Stress: Not on file  ?Social Connections: Not on file  ?Intimate Partner Violence: Not on file  ? ?Family History  ?Problem Relation Age of Onset  ? CAD Neg Hx   ? ?BP (!) 142/90   Pulse (!) 107   Wt 81.6 kg (179 lb 12.8 oz)   SpO2 98%   BMI 38.91 kg/m?  ? ?Wt Readings from Last 3 Encounters:  ?03/17/22 81.6 kg (179  lb 12.8 oz)  ?03/05/22 82 kg (180 lb 12.4 oz)  ?12/11/21 83.7 kg (184 lb 9.6 oz)  ? ?PHYSICAL EXAM: ?General:  NAD. No resp difficulty, walked into clinic with RW ?HEENT: Normal ?Neck: Supple. JVP difficult with + v-waves. Carotids 2+ bilat; no bruits. No lymphadenopathy or thryomegaly appreciated. ?Cor: PMI nondisplaced. Tachy rate & rhythm. No rubs, gallops, 2/6 TR & 2/6 AS murmurs ?Lungs: Clear ?Abdomen: Obese, nontender, nondistended. No hepatosplenomegaly. No bruits or masses. Good bowel sounds. ?Extremities: No cyanosis, clubbing, rash, edema ?Neuro: Alert & oriented x 3, cranial nerves grossly intact. Moves all 4 extremities w/o difficulty. Affect pleasant. ? ?ECG: ST 104 bpm (personally reviewed). ? ?ASSESSMENT & PLAN: ?1. CAD with Inferior STEMI: ?- Late presentation MI, totally occluded RCA with RV infarct as consequence.  ?- LHC (4/23): 100% RCA, 60% LM, 50% Lcx, 60% p to m LAD, 90% Ramus. Manage medically. ?- No chest pain. ?- Continue DAPT/statin ?- No beta blocker yet with shock/RV failure. ?- Needs CR when finished with HH PT. ?  ?2. Chronic biventricular heart failure: ?- EF down to 30-35% in 12/22 in setting of COVID-19 infection.  ?- LV function subsequently recovered, Echo (3/23): EF up to 50-55%  ?- Echo (4/23): EF 30-35%, RV severely reduced, moderate to severe TR, moderate to severe aortic valve stenosis with mean gradient 12 mmHg and AVA 0.85 cm2 ?- RHC (4/23) on 2.5 DBA: severe RV failure and severe TR - RA 19 (prominent v waves), PCWP 5, Fick CO 4.1/Index 2.3, PAPi 2.8 ?- NYHA II. Volume looks OK on exam, weight stable. ?- JVP elevated in  setting of severe TR.  ?- Start Entresto 24/26 mg bid. ?- Change torsemide to 20 mg daily PRN weight gain/edema. ?- Continue digoxin 0.125 mg daily. ?- Continue spironolactone 25 mg daily. ?- Continue Jardiance 10 mg daily.  ?-

## 2022-03-17 ENCOUNTER — Other Ambulatory Visit (HOSPITAL_COMMUNITY): Payer: Self-pay

## 2022-03-17 ENCOUNTER — Ambulatory Visit (HOSPITAL_COMMUNITY)
Admit: 2022-03-17 | Discharge: 2022-03-17 | Disposition: A | Discharge status: Home / Self Care | Payer: Medicare HMO | Attending: Family Medicine | Admitting: Family Medicine

## 2022-03-17 ENCOUNTER — Encounter (HOSPITAL_COMMUNITY): Payer: Self-pay

## 2022-03-17 VITALS — BP 142/90 | HR 107 | Wt 179.8 lb

## 2022-03-17 DIAGNOSIS — I2510 Atherosclerotic heart disease of native coronary artery without angina pectoris: Secondary | ICD-10-CM

## 2022-03-17 DIAGNOSIS — Z79899 Other long term (current) drug therapy: Secondary | ICD-10-CM | POA: Insufficient documentation

## 2022-03-17 DIAGNOSIS — I350 Nonrheumatic aortic (valve) stenosis: Secondary | ICD-10-CM | POA: Diagnosis not present

## 2022-03-17 DIAGNOSIS — K219 Gastro-esophageal reflux disease without esophagitis: Secondary | ICD-10-CM | POA: Insufficient documentation

## 2022-03-17 DIAGNOSIS — E785 Hyperlipidemia, unspecified: Secondary | ICD-10-CM | POA: Diagnosis not present

## 2022-03-17 DIAGNOSIS — I5022 Chronic systolic (congestive) heart failure: Secondary | ICD-10-CM | POA: Diagnosis not present

## 2022-03-17 DIAGNOSIS — I071 Rheumatic tricuspid insufficiency: Secondary | ICD-10-CM

## 2022-03-17 DIAGNOSIS — E669 Obesity, unspecified: Secondary | ICD-10-CM | POA: Diagnosis not present

## 2022-03-17 DIAGNOSIS — Z8616 Personal history of COVID-19: Secondary | ICD-10-CM | POA: Diagnosis not present

## 2022-03-17 DIAGNOSIS — R5381 Other malaise: Secondary | ICD-10-CM

## 2022-03-17 DIAGNOSIS — I252 Old myocardial infarction: Secondary | ICD-10-CM | POA: Diagnosis not present

## 2022-03-17 DIAGNOSIS — I5042 Chronic combined systolic (congestive) and diastolic (congestive) heart failure: Secondary | ICD-10-CM | POA: Diagnosis not present

## 2022-03-17 DIAGNOSIS — I5082 Biventricular heart failure: Secondary | ICD-10-CM | POA: Diagnosis not present

## 2022-03-17 DIAGNOSIS — I082 Rheumatic disorders of both aortic and tricuspid valves: Secondary | ICD-10-CM | POA: Diagnosis not present

## 2022-03-17 DIAGNOSIS — Z7984 Long term (current) use of oral hypoglycemic drugs: Secondary | ICD-10-CM | POA: Diagnosis not present

## 2022-03-17 DIAGNOSIS — I441 Atrioventricular block, second degree: Secondary | ICD-10-CM

## 2022-03-17 DIAGNOSIS — R7303 Prediabetes: Secondary | ICD-10-CM

## 2022-03-17 DIAGNOSIS — N179 Acute kidney failure, unspecified: Secondary | ICD-10-CM | POA: Insufficient documentation

## 2022-03-17 LAB — COMPREHENSIVE METABOLIC PANEL
ALT: 25 U/L (ref 0–44)
AST: 22 U/L (ref 15–41)
Albumin: 3.3 g/dL — ABNORMAL LOW (ref 3.5–5.0)
Alkaline Phosphatase: 82 U/L (ref 38–126)
Anion gap: 10 (ref 5–15)
BUN: 13 mg/dL (ref 8–23)
CO2: 22 mmol/L (ref 22–32)
Calcium: 9.4 mg/dL (ref 8.9–10.3)
Chloride: 105 mmol/L (ref 98–111)
Creatinine, Ser: 0.85 mg/dL (ref 0.44–1.00)
GFR, Estimated: >60 mL/min (ref >60)
Glucose, Bld: 126 mg/dL — ABNORMAL HIGH (ref 70–99)
Potassium: 4.5 mmol/L (ref 3.5–5.1)
Sodium: 137 mmol/L (ref 135–145)
Total Bilirubin: 0.4 mg/dL (ref 0.3–1.2)
Total Protein: 7 g/dL (ref 6.5–8.1)

## 2022-03-17 LAB — DIGOXIN LEVEL: Digoxin Level: 0.6 ng/mL — ABNORMAL LOW (ref 0.8–2.0)

## 2022-03-17 MED ORDER — TORSEMIDE 20 MG PO TABS: 20.0000 mg | ORAL_TABLET | Freq: Every day | ORAL | 5 refills | Status: DC | PRN

## 2022-03-17 MED ORDER — ENTRESTO 24-26 MG PO TABS: 1.0000 | ORAL_TABLET | Freq: Two times a day (BID) | ORAL | 3 refills | Status: DC

## 2022-03-17 NOTE — Patient Instructions (Addendum)
EKG done today. ? ?Labs done today. We will contact you only if your labs are abnormal. ? ?START Entresto 24-'26mg'$  (1 tablet) by mouth 2 times daily.  ? ?DECREASE Torsemide to '20mg'$  (1 tablet) by mouth daily as needed for swelling or weight gain.  ? ?No other medication changes were made. Please continue all current medications as prescribed. ? ?Your physician recommends that you schedule a follow-up appointment in: 10 days for a lab only appointment, 4 weeks with our NP/PA Clinic here in our office and in 4 months with Dr. Haroldine Laws with an echo prior to your exam.  ? ?Your physician has requested that you have an echocardiogram. Echocardiography is a painless test that uses sound waves to create images of your heart. It provides your doctor with information about the size and shape of your heart and how well your heart?s chambers and valves are working. This procedure takes approximately one hour. There are no restrictions for this procedure. ? ?If you have any questions or concerns before your next appointment please send [place] a message through [place] or call our office at [phone].   ? ?TO LEAVE A MESSAGE FOR THE NURSE SELECT OPTION 2, PLEASE LEAVE A MESSAGE INCLUDING: ?YOUR NAME ?DATE OF BIRTH ?CALL BACK NUMBER ?REASON FOR CALL**this is important as we prioritize the call backs ? ?YOU WILL RECEIVE A CALL BACK THE SAME DAY AS LONG AS YOU CALL BEFORE 4:00 PM ? ? ?Do the following things EVERYDAY: ?Weigh yourself in the morning before breakfast. Write it down and keep it in a log. ?Take your medicines as prescribed ?Eat low salt foods--Limit salt (sodium) to 2000 mg per day.  ?Stay as active as you can everyday ?Limit all fluids for the day to less than 2 liters ? ? ?At the [facility], you and your health needs are our priority. As part of our continuing mission to provide you with exceptional heart care, we have created designated Provider Care Teams. These Care Teams include your primary  Cardiologist (physician) and Advanced Practice Providers (APPs- Physician Assistants and Nurse Practitioners) who all work together to provide you with the care you need, when you need it.  ? ?You may see any of the following providers on your designated Care Team at your next follow up: ?Dr Glori Bickers ?Dr Loralie Champagne ?Darrick Grinder, NP ?Lyda Jester, PA ?Audry Riles, PharmD ? ? ?Please be sure to bring in all your medications bottles to every appointment.  ? ?

## 2022-03-27 ENCOUNTER — Ambulatory Visit (HOSPITAL_COMMUNITY)
Admission: RE | Admit: 2022-03-27 | Discharge: 2022-03-27 | Disposition: A | Discharge status: Home / Self Care | Payer: Medicare HMO | Source: Ambulatory Visit | Attending: Internal Medicine | Admitting: Internal Medicine

## 2022-03-27 DIAGNOSIS — I5042 Chronic combined systolic (congestive) and diastolic (congestive) heart failure: Secondary | ICD-10-CM | POA: Insufficient documentation

## 2022-03-27 LAB — BASIC METABOLIC PANEL
Anion gap: 8 (ref 5–15)
BUN: 14 mg/dL (ref 8–23)
CO2: 28 mmol/L (ref 22–32)
Calcium: 9.3 mg/dL (ref 8.9–10.3)
Chloride: 104 mmol/L (ref 98–111)
Creatinine, Ser: 0.92 mg/dL (ref 0.44–1.00)
GFR, Estimated: >60 mL/min (ref >60)
Glucose, Bld: 131 mg/dL — ABNORMAL HIGH (ref 70–99)
Potassium: 5.8 mmol/L — ABNORMAL HIGH (ref 3.5–5.1)
Sodium: 140 mmol/L (ref 135–145)

## 2022-03-28 ENCOUNTER — Other Ambulatory Visit (HOSPITAL_COMMUNITY): Payer: Self-pay | Admitting: *Deleted

## 2022-03-28 ENCOUNTER — Other Ambulatory Visit (HOSPITAL_COMMUNITY): Payer: Self-pay

## 2022-03-28 ENCOUNTER — Telehealth (HOSPITAL_COMMUNITY): Payer: Self-pay | Admitting: *Deleted

## 2022-03-28 DIAGNOSIS — I5042 Chronic combined systolic (congestive) and diastolic (congestive) heart failure: Secondary | ICD-10-CM

## 2022-03-28 MED ORDER — LOKELMA 10 G PO PACK: 10.0000 g | PACK | Freq: Once | ORAL | 0 refills | Status: DC

## 2022-03-28 MED ORDER — VELTASSA 8.4 G PO PACK: 8.4000 g | PACK | Freq: Once | ORAL | 0 refills | Status: AC

## 2022-03-28 NOTE — Telephone Encounter (Signed)
-----   Message from Rafael Bihari, [place] sent at 03/28/2022  8:10 AM EDT ----- ?Left message to call back.  ? ?Please call later today. ?

## 2022-03-31 ENCOUNTER — Ambulatory Visit (HOSPITAL_COMMUNITY)
Admission: RE | Admit: 2022-03-31 | Discharge: 2022-03-31 | Disposition: A | Discharge status: Home / Self Care | Payer: Medicare HMO | Source: Ambulatory Visit | Attending: Internal Medicine | Admitting: Internal Medicine

## 2022-03-31 DIAGNOSIS — I5042 Chronic combined systolic (congestive) and diastolic (congestive) heart failure: Secondary | ICD-10-CM

## 2022-03-31 LAB — BASIC METABOLIC PANEL
Anion gap: 7 (ref 5–15)
BUN: 11 mg/dL (ref 8–23)
CO2: 25 mmol/L (ref 22–32)
Calcium: 9 mg/dL (ref 8.9–10.3)
Chloride: 104 mmol/L (ref 98–111)
Creatinine, Ser: 0.91 mg/dL (ref 0.44–1.00)
GFR, Estimated: >60 mL/min (ref >60)
Glucose, Bld: 107 mg/dL — ABNORMAL HIGH (ref 70–99)
Potassium: 4.9 mmol/L (ref 3.5–5.1)
Sodium: 136 mmol/L (ref 135–145)

## 2022-04-01 ENCOUNTER — Telehealth (HOSPITAL_COMMUNITY): Payer: Self-pay | Admitting: Surgery

## 2022-04-01 NOTE — Telephone Encounter (Signed)
I called and spoke with patients daughter and made her aware of labwork results. She is also aware that per provider no changes to medications at this time. ?

## 2022-04-01 NOTE — Telephone Encounter (Signed)
-----   Message from Rafael Bihari, [place] sent at 03/31/2022 10:11 AM EDT ----- ?K improved. Remain off spiro and Entresto for now ?

## 2022-04-06 ENCOUNTER — Other Ambulatory Visit: Payer: Self-pay | Admitting: Home Health

## 2022-04-06 MED ORDER — DIGOXIN 125 MCG PO TABS: 0.1250 mg | ORAL_TABLET | Freq: Every day | ORAL | 2 refills | Status: DC

## 2022-04-06 MED ORDER — CLOPIDOGREL BISULFATE 75 MG PO TABS: 75.0000 mg | ORAL_TABLET | Freq: Every day | ORAL | 2 refills | Status: DC

## 2022-04-06 NOTE — Progress Notes (Signed)
Patient's daughter called reporting patient has ran out of refill for Plavix and digoxin, both prescription has been sent to [place] per request.

## 2022-04-15 NOTE — Progress Notes (Signed)
ADVANCED HF CLINIC NOTE  Primary Care: Shirline Frees, MD HF Cardiologist: Dr. Haroldine Laws  HPI: Amy [name] is a 76 y.o.Marland Kitchen female with history of HFrEF in setting of COVID-19 infection with recovered EF, aortic valve stenosis, HLD, GERD, prediabetes, obesity.    Admitted 4/23 with late presentation inferior STEMI. Not taken for urgent cardiac cath as she completed her MI and had AKI. Course complicated by RV infarct>>cardiogenic shock. 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 EP consulted and felt to be secondary to late presentation MI. Felt conduction would improve and no need for permanent pacemaker. Echo showed EF 30-35%, RV severely dilated with severely reduced function, moderate to severe TR, moderate to severe AS. She was given IV fluids and started on DBA for RV support.  Scr improved and underwent R/LHC showing (on DBA 2.5): 100% RCA, 60% LM, 50% Lcx, 60% p to m LAD, 90% Ramus, RA 19 (with prominent v waves), PA 27/11 (19), PCWP 5, Fick CO 4.1/Index 2.3, PA sat 43%/49%, PAPi 0.8. Palliative consulted and she remained DNR. DBA weaned off and GDMT titrated. Discharged home with HH, weight 180 lbs.  Post hospital follow up, stable NYHA II symptoms. Entresto started and torsemide changed to PRN. Labs showed K 5.8, instructed to stop spiro and Entresto and take dose of Lokelma.  Today she returns for HF follow up. Overall feeling fine. Takes meclizine for chronic dizziness, no falls.  Continues to work with PT and does not have SOB walking with her rolling walker. Denies palpitations, CP, dizziness, or PND/Orthopnea. Appetite ok. No fever or chills. Weight at home 173 pounds. She has been out of Jardiance x 1 week. Took PRN torsemide this past week for swelling.  Cardiac Studies: - Echo (4/23): EF 30-35%, RV severely dilated with severely reduced function, inferolateral akinesis, moderate to severe TR, moderate to severe AS.   - R/LHC (4/23): CAD with interval occlusion of  ostial RCA with associate large RV infarct, boderline 60% LM lesion, mild AS, hemodynamics c/w with severe RV failure and severe TR    Ost RCA to Prox RCA lesion is 100% stenosed.   Ost LM lesion is 60% stenosed.   Ost Cx lesion is 50% stenosed.   Prox LAD to Mid LAD lesion is 60% stenosed.   Ramus lesion is 90% stenosed.  On dobutamine 2.5   Ao = 83/53 (67) LV = 92/14 RA = 19 (with prominent v- waves) RV = 28/18 PA = 27/11 (19) PCW = 5 Fick cardiac output/index = 4.1/2.3 PVR = 3.4 WU FA sat = 88% PA sat = 43%, 49% PAPi = 0.8 AoV gradient peak to peak 2.36mHG on pullback    Past Medical History:  Diagnosis Date   GERD (gastroesophageal reflux disease)    Hyperlipidemia    Obesity    Pre-diabetes    Current Outpatient Medications  Medication Sig Dispense Refill   acetaminophen (TYLENOL) 500 MG tablet Take 500 mg by mouth every 6 (six) hours as needed for mild pain or headache.     aspirin 81 MG EC tablet Take 81 mg by mouth every evening.     atorvastatin (LIPITOR) 80 MG tablet Take 1 tablet (80 mg total) by mouth daily. 30 tablet 0   Carboxymethylcellulose Sodium (DRY EYE RELIEF OP) Place 1 drop into both eyes 4 (four) times daily as needed (dryness).     clopidogrel (PLAVIX) 75 MG tablet Take 1 tablet (75 mg total) by mouth daily. 31 tablet 2  Coenzyme Q10 (CO Q-10) 200 MG CAPS Take 200 mg by mouth daily.     Cyanocobalamin (VITAMIN B 12 PO) Take 1 tablet by mouth daily.     digoxin (LANOXIN) 0.125 MG tablet Take 1 tablet (0.125 mg total) by mouth daily. 31 tablet 2   meclizine (ANTIVERT) 25 MG tablet Take 25 mg by mouth daily.     Multiple Vitamin (MULTI-VITAMIN) tablet Take 1 tablet by mouth daily.     Pramoxine-Dimethicone (GOLD BOND INTENSIVE HEALING EX) Apply 1 application topically daily as needed (eczema).     torsemide (DEMADEX) 20 MG tablet Take 1 tablet (20 mg total) by mouth daily as needed. 30 tablet 5   empagliflozin (JARDIANCE) 10 MG TABS tablet Take 1  tablet (10 mg total) by mouth daily. (Patient not taking: Reported on 04/17/2022) 31 tablet 1   No current facility-administered medications for this encounter.   Allergies  Allergen Reactions   Timolol Shortness Of Breath   Codeine Other (See Comments)    sick   Morphine And Related Nausea And Vomiting   Other Other (See Comments)    vicryl sutures - rash/redness, delay in healing   Social History   Socioeconomic History   Marital status: Married    Spouse name: Not on file   Number of children: Not on file   Years of education: Not on file   Highest education level: Not on file  Occupational History   Occupation: retired  Tobacco Use   Smoking status: Never   Smokeless tobacco: Never  Substance and Sexual Activity   Alcohol use: No   Drug use: No   Sexual activity: Not on file  Other Topics Concern   Not on file  Social History Narrative   Not on file   Social Determinants of Health   Financial Resource Strain: Not on file  Food Insecurity: Not on file  Transportation Needs: Not on file  Physical Activity: Not on file  Stress: Not on file  Social Connections: Not on file  Intimate Partner Violence: Not on file   Family History  Problem Relation Age of Onset   CAD Neg Hx    BP 122/68   Pulse 89   Wt 81.2 kg (179 lb)   SpO2 91%   BMI 38.74 kg/m   Wt Readings from Last 3 Encounters:  04/17/22 81.2 kg (179 lb)  03/17/22 81.6 kg (179 lb 12.8 oz)  03/05/22 82 kg (180 lb 12.4 oz)   PHYSICAL EXAM: General:  NAD. No resp difficulty, walked into clinic with rolling walker. HEENT: Normal Neck: Supple. + v waves, JVP difficult. Carotids 2+ bilat; no bruits. No lymphadenopathy or thryomegaly appreciated. Cor: PMI nondisplaced. Regular rate & rhythm. No rubs, gallops, 2/6 TR and 2/6 AS Lungs: Clear Abdomen: Obese, soft, nontender, nondistended. No hepatosplenomegaly. No bruits or masses. Good bowel sounds. Extremities: No cyanosis, clubbing, rash, edema Neuro:  Alert & oriented x 3, cranial nerves grossly intact. Moves all 4 extremities w/o difficulty. Affect pleasant.  ASSESSMENT & PLAN: 1. CAD with Inferior STEMI: - Late presentation MI, totally occluded RCA with RV infarct as consequence.  - LHC (4/23): 100% RCA, 60% LM, 50% Lcx, 60% p to m LAD, 90% Ramus. Manage medically. - No chest pain. - Continue DAPT/statin. - Start Toprol XL today. - Needs CR when finished with HH PT.   2. Chronic biventricular heart failure: - EF down to 30-35% in 12/22 in setting of COVID-19 infection.  - LV function subsequently recovered,  Echo (3/23): EF up to 50-55%  - Echo (4/23): EF 30-35%, RV severely reduced, moderate to severe TR, moderate to severe aortic valve stenosis with mean gradient 12 mmHg and AVA 0.85 cm2 - RHC (4/23) on 2.5 DBA: severe RV failure and severe TR - RA 19 (prominent v waves), PCWP 5, Fick CO 4.1/Index 2.3, PAPi 2.8 - JVP elevated in setting of severe TR.  - NYHA II. Volume looks OK on exam, weight stable. GDMT limited by hyperkalemia. - Start Toprol XL 12.5 mg daily. - Restart Jardiance 10 mg daily (awaiting mail order delivery). Will give samples today. Arlyce Harman and Delene Loll recently stopped with elevated K. Plan to add back slowly as able. - Continue torsemide 20 mg daily PRN. - Continue digoxin 0.125 mg daily. Dig level 0.6 on 03/17/22 - Continue to weigh daily. - BMET/BNP today.   3. Tricuspid Regurgitation: - Moderate to severe on echo. - RHC consistent with severe TR. - Medical management for now.  - Hopefully will improve as RV improves, otherwise can consider clip   4. Aortic valve stenosis: - Mild to moderate.   5. Mobitz II second degree AV block: - EP has seen, no indications for pacemaker. It was felt that this would likely recover with time.   6. Prediabetes: - A1c 6.3%. - Continue SGLT2i.   7. Physical deconditioning: - Doing better. Continue HH PT. - Daughter lives with her.   Follow up with APP in 4 weeks  (plan to add back spiro/ARNi if able) and Dr. Haroldine Laws in 3 months + echo.  Allena Katz, FNP-BC 04/17/22

## 2022-04-17 ENCOUNTER — Encounter (HOSPITAL_COMMUNITY): Payer: Self-pay

## 2022-04-17 ENCOUNTER — Ambulatory Visit (HOSPITAL_COMMUNITY)
Admission: RE | Admit: 2022-04-17 | Discharge: 2022-04-17 | Disposition: A | Discharge status: Home / Self Care | Payer: Medicare HMO | Source: Ambulatory Visit | Attending: Family Medicine | Admitting: Family Medicine

## 2022-04-17 VITALS — BP 122/68 | HR 89 | Wt 179.0 lb

## 2022-04-17 DIAGNOSIS — Z79899 Other long term (current) drug therapy: Secondary | ICD-10-CM | POA: Diagnosis not present

## 2022-04-17 DIAGNOSIS — Z7982 Long term (current) use of aspirin: Secondary | ICD-10-CM | POA: Insufficient documentation

## 2022-04-17 DIAGNOSIS — R42 Dizziness and giddiness: Secondary | ICD-10-CM | POA: Insufficient documentation

## 2022-04-17 DIAGNOSIS — I350 Nonrheumatic aortic (valve) stenosis: Secondary | ICD-10-CM | POA: Diagnosis not present

## 2022-04-17 DIAGNOSIS — I5082 Biventricular heart failure: Secondary | ICD-10-CM | POA: Insufficient documentation

## 2022-04-17 DIAGNOSIS — R7303 Prediabetes: Secondary | ICD-10-CM | POA: Insufficient documentation

## 2022-04-17 DIAGNOSIS — E875 Hyperkalemia: Secondary | ICD-10-CM | POA: Diagnosis not present

## 2022-04-17 DIAGNOSIS — Z6838 Body mass index (BMI) 38.0-38.9, adult: Secondary | ICD-10-CM | POA: Insufficient documentation

## 2022-04-17 DIAGNOSIS — I2510 Atherosclerotic heart disease of native coronary artery without angina pectoris: Secondary | ICD-10-CM | POA: Insufficient documentation

## 2022-04-17 DIAGNOSIS — Z8249 Family history of ischemic heart disease and other diseases of the circulatory system: Secondary | ICD-10-CM | POA: Diagnosis not present

## 2022-04-17 DIAGNOSIS — E669 Obesity, unspecified: Secondary | ICD-10-CM | POA: Diagnosis not present

## 2022-04-17 DIAGNOSIS — Z66 Do not resuscitate: Secondary | ICD-10-CM | POA: Insufficient documentation

## 2022-04-17 DIAGNOSIS — Z7902 Long term (current) use of antithrombotics/antiplatelets: Secondary | ICD-10-CM | POA: Insufficient documentation

## 2022-04-17 DIAGNOSIS — I252 Old myocardial infarction: Secondary | ICD-10-CM | POA: Insufficient documentation

## 2022-04-17 DIAGNOSIS — K219 Gastro-esophageal reflux disease without esophagitis: Secondary | ICD-10-CM | POA: Diagnosis not present

## 2022-04-17 DIAGNOSIS — R5381 Other malaise: Secondary | ICD-10-CM | POA: Diagnosis not present

## 2022-04-17 DIAGNOSIS — I082 Rheumatic disorders of both aortic and tricuspid valves: Secondary | ICD-10-CM | POA: Diagnosis not present

## 2022-04-17 DIAGNOSIS — E785 Hyperlipidemia, unspecified: Secondary | ICD-10-CM | POA: Diagnosis not present

## 2022-04-17 DIAGNOSIS — I441 Atrioventricular block, second degree: Secondary | ICD-10-CM | POA: Diagnosis not present

## 2022-04-17 DIAGNOSIS — Z7984 Long term (current) use of oral hypoglycemic drugs: Secondary | ICD-10-CM | POA: Insufficient documentation

## 2022-04-17 DIAGNOSIS — I071 Rheumatic tricuspid insufficiency: Secondary | ICD-10-CM | POA: Diagnosis not present

## 2022-04-17 DIAGNOSIS — Z8616 Personal history of COVID-19: Secondary | ICD-10-CM | POA: Diagnosis not present

## 2022-04-17 DIAGNOSIS — I5022 Chronic systolic (congestive) heart failure: Secondary | ICD-10-CM | POA: Diagnosis not present

## 2022-04-17 LAB — BRAIN NATRIURETIC PEPTIDE: B Natriuretic Peptide: 578.9 pg/mL — ABNORMAL HIGH (ref 0.0–100.0)

## 2022-04-17 MED ORDER — METOPROLOL SUCCINATE ER 25 MG PO TB24: ORAL_TABLET | ORAL | 11 refills | Status: DC

## 2022-04-17 NOTE — Patient Instructions (Signed)
START Toprol XL 12.5 mg one half tab daily  Labs today We will only contact you if something comes back abnormal or we need to make some changes. Otherwise no news is good news!  Your physician recommends that you schedule a follow-up appointment in: 3-4 weeks  in the Advanced Practitioners (PA/NP) Clinic '' Keep cardiology follow up as scheduled with Dr Haroldine Laws   Do the following things EVERYDAY: Weigh yourself in the morning before breakfast. Write it down and keep it in a log. Take your medicines as prescribed Eat low salt foods--Limit salt (sodium) to 2000 mg per day.  Stay as active as you can everyday Limit all fluids for the day to less than 2 liters  At the [facility], you and your health needs are our priority. As part of our continuing mission to provide you with exceptional heart care, we have created designated Provider Care Teams. These Care Teams include your primary Cardiologist (physician) and Advanced Practice Providers (APPs- Physician Assistants and Nurse Practitioners) who all work together to provide you with the care you need, when you need it.   You may see any of the following providers on your designated Care Team at your next follow up: Dr Glori Bickers Dr Haynes Kerns, NP Lyda Jester, [place] [facility] [place], [place] Audry Riles, PharmD   Please be sure to bring in all your medications bottles to every appointment.

## 2022-04-17 NOTE — Progress Notes (Signed)
Medication Samples have been provided to the patient.  Drug name: jardiance       Strength: 10 mg        Qty: 14  LOT: [id number] ex 11/2023  Lot [id number] ex 03/2024  Dosing instructions: one tab daily  The patient has been instructed regarding the correct time, dose, and frequency of taking this medication, including desired effects and most common side effects.   [name] 3:53 PM 04/17/2022

## 2022-04-18 ENCOUNTER — Telehealth (HOSPITAL_COMMUNITY): Payer: Self-pay

## 2022-04-18 DIAGNOSIS — I5022 Chronic systolic (congestive) heart failure: Secondary | ICD-10-CM

## 2022-04-18 NOTE — Telephone Encounter (Signed)
Patient advised and verbalized understanding. Lab appt scheduled, lab order entered.   Orders Placed This Encounter  Procedures   Basic metabolic panel    Standing Status:   Future    Standing Expiration Date:   04/19/2023    Order Specific Question:   Release to patient    Answer:   Immediate

## 2022-04-18 NOTE — Telephone Encounter (Signed)
-----   Message from Rafael Bihari, [place] sent at 04/18/2022 11:47 AM EDT ----- She needs a BMET.

## 2022-04-24 ENCOUNTER — Ambulatory Visit (HOSPITAL_COMMUNITY)
Admission: RE | Admit: 2022-04-24 | Discharge: 2022-04-24 | Disposition: A | Discharge status: Home / Self Care | Payer: Medicare HMO | Source: Ambulatory Visit | Attending: Cardiology | Admitting: Cardiology

## 2022-04-24 DIAGNOSIS — I5022 Chronic systolic (congestive) heart failure: Secondary | ICD-10-CM | POA: Insufficient documentation

## 2022-04-24 LAB — BASIC METABOLIC PANEL
Anion gap: 9 (ref 5–15)
BUN: 16 mg/dL (ref 8–23)
CO2: 21 mmol/L — ABNORMAL LOW (ref 22–32)
Calcium: 8.8 mg/dL — ABNORMAL LOW (ref 8.9–10.3)
Chloride: 108 mmol/L (ref 98–111)
Creatinine, Ser: 0.75 mg/dL (ref 0.44–1.00)
GFR, Estimated: >60 mL/min (ref >60)
Glucose, Bld: 140 mg/dL — ABNORMAL HIGH (ref 70–99)
Potassium: 4.4 mmol/L (ref 3.5–5.1)
Sodium: 138 mmol/L (ref 135–145)

## 2022-05-15 ENCOUNTER — Other Ambulatory Visit (HOSPITAL_COMMUNITY): Payer: Self-pay

## 2022-05-19 ENCOUNTER — Encounter (HOSPITAL_COMMUNITY): Payer: Self-pay

## 2022-05-19 ENCOUNTER — Other Ambulatory Visit (HOSPITAL_COMMUNITY): Payer: Self-pay | Admitting: Family Medicine

## 2022-05-19 ENCOUNTER — Ambulatory Visit (HOSPITAL_COMMUNITY)
Admission: RE | Admit: 2022-05-19 | Discharge: 2022-05-19 | Disposition: A | Discharge status: Home / Self Care | Payer: Medicare HMO | Source: Ambulatory Visit | Attending: Family Medicine | Admitting: Family Medicine

## 2022-05-19 VITALS — BP 112/80 | HR 80 | Wt 174.4 lb

## 2022-05-19 DIAGNOSIS — E875 Hyperkalemia: Secondary | ICD-10-CM | POA: Diagnosis not present

## 2022-05-19 DIAGNOSIS — I5082 Biventricular heart failure: Secondary | ICD-10-CM | POA: Diagnosis not present

## 2022-05-19 DIAGNOSIS — E669 Obesity, unspecified: Secondary | ICD-10-CM | POA: Diagnosis not present

## 2022-05-19 DIAGNOSIS — I441 Atrioventricular block, second degree: Secondary | ICD-10-CM | POA: Diagnosis not present

## 2022-05-19 DIAGNOSIS — I071 Rheumatic tricuspid insufficiency: Secondary | ICD-10-CM

## 2022-05-19 DIAGNOSIS — I350 Nonrheumatic aortic (valve) stenosis: Secondary | ICD-10-CM | POA: Diagnosis not present

## 2022-05-19 DIAGNOSIS — Z8616 Personal history of COVID-19: Secondary | ICD-10-CM | POA: Diagnosis not present

## 2022-05-19 DIAGNOSIS — R5381 Other malaise: Secondary | ICD-10-CM

## 2022-05-19 DIAGNOSIS — Z7984 Long term (current) use of oral hypoglycemic drugs: Secondary | ICD-10-CM | POA: Insufficient documentation

## 2022-05-19 DIAGNOSIS — K219 Gastro-esophageal reflux disease without esophagitis: Secondary | ICD-10-CM | POA: Diagnosis not present

## 2022-05-19 DIAGNOSIS — Z79899 Other long term (current) drug therapy: Secondary | ICD-10-CM | POA: Insufficient documentation

## 2022-05-19 DIAGNOSIS — E785 Hyperlipidemia, unspecified: Secondary | ICD-10-CM | POA: Diagnosis not present

## 2022-05-19 DIAGNOSIS — I082 Rheumatic disorders of both aortic and tricuspid valves: Secondary | ICD-10-CM | POA: Diagnosis not present

## 2022-05-19 DIAGNOSIS — R7303 Prediabetes: Secondary | ICD-10-CM | POA: Diagnosis not present

## 2022-05-19 DIAGNOSIS — I2510 Atherosclerotic heart disease of native coronary artery without angina pectoris: Secondary | ICD-10-CM | POA: Diagnosis not present

## 2022-05-19 DIAGNOSIS — Z8673 Personal history of transient ischemic attack (TIA), and cerebral infarction without residual deficits: Secondary | ICD-10-CM | POA: Insufficient documentation

## 2022-05-19 DIAGNOSIS — I5022 Chronic systolic (congestive) heart failure: Secondary | ICD-10-CM | POA: Diagnosis not present

## 2022-05-19 LAB — BASIC METABOLIC PANEL
Anion gap: 6 (ref 5–15)
BUN: 18 mg/dL (ref 8–23)
CO2: 28 mmol/L (ref 22–32)
Calcium: 9.3 mg/dL (ref 8.9–10.3)
Chloride: 105 mmol/L (ref 98–111)
Creatinine, Ser: 0.85 mg/dL (ref 0.44–1.00)
GFR, Estimated: >60 mL/min (ref >60)
Glucose, Bld: 124 mg/dL — ABNORMAL HIGH (ref 70–99)
Potassium: 5.1 mmol/L (ref 3.5–5.1)
Sodium: 139 mmol/L (ref 135–145)

## 2022-05-19 MED ORDER — SPIRONOLACTONE 25 MG PO TABS: 12.5000 mg | ORAL_TABLET | Freq: Every day | ORAL | 1 refills | Status: DC

## 2022-05-19 NOTE — Patient Instructions (Signed)
Thank you for coming in today  Labs were done today, if any labs are abnormal the clinic will call you No news is good news  START Spironolactone 12.5 mg 1 tablet daily   You have been referred to cardiac rehab they will contact you for further details  Your physician recommends that you schedule a follow-up appointment in:  Keep follow up with Dr. Haroldine Laws with echocardiogram 3 weeks with pharmacy   Your physician recommends that you return for lab work in:  BMET and Dig level in 1 week [place]  At the [facility], you and your health needs are our priority. As part of our continuing mission to provide you with exceptional heart care, we have created designated Provider Care Teams. These Care Teams include your primary Cardiologist (physician) and Advanced Practice Providers (APPs- Physician Assistants and Nurse Practitioners) who all work together to provide you with the care you need, when you need it.   You may see any of the following providers on your designated Care Team at your next follow up: Dr Glori Bickers Dr Haynes Kerns, NP Lyda Jester, [place] [facility] [place], [place] Audry Riles, PharmD   Please be sure to bring in all your medications bottles to every appointment.   If you have any questions or concerns before your next appointment please send [place] a message through [place] or call our office at [phone].    TO LEAVE A MESSAGE FOR THE NURSE SELECT OPTION 2, PLEASE LEAVE A MESSAGE INCLUDING: YOUR NAME DATE OF BIRTH CALL BACK NUMBER REASON FOR CALL**this is important as we prioritize the call backs  YOU WILL RECEIVE A CALL BACK THE SAME DAY AS LONG AS YOU CALL BEFORE 4:00 PM

## 2022-05-19 NOTE — Progress Notes (Signed)
ADVANCED HF CLINIC NOTE  Primary Care: Shirline Frees, MD HF Cardiologist: Dr. Haroldine Laws  HPI: Amy [name] is a 76 y.o.Marland Kitchen female with history of HFrEF in setting of COVID-19 infection with recovered EF, aortic valve stenosis, HLD, GERD, prediabetes, obesity.    Admitted 4/23 with late presentation inferior STEMI. Not taken for urgent cardiac cath as she completed her MI and had AKI. Course complicated by RV infarct>>cardiogenic shock. 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 EP consulted and felt to be secondary to late presentation MI. Felt conduction would improve and no need for permanent pacemaker. Echo showed EF 30-35%, RV severely dilated with severely reduced function, moderate to severe TR, moderate to severe AS. She was given IV fluids and started on DBA for RV support.  Scr improved and underwent R/LHC showing (on DBA 2.5): 100% RCA, 60% LM, 50% Lcx, 60% p to m LAD, 90% Ramus, RA 19 (with prominent v waves), PA 27/11 (19), PCWP 5, Fick CO 4.1/Index 2.3, PA sat 43%/49%, PAPi 0.8. Palliative consulted and she remained DNR. DBA weaned off and GDMT titrated. Discharged home with HH, weight 180 lbs.  Post hospital follow up, stable NYHA II symptoms. Entresto started and torsemide changed to PRN. Labs showed K 5.8, instructed to stop spiro and Entresto and take dose of Lokelma.  Today she returns for HF follow up. Overall feeling fine. Feels activity tolerance has improved with PT, no longer fatigued with housework. Finished PT 2 weeks ago and uses walker when walking further distances. Denies palpitations, abnormal bleeding, CP, dizziness, edema, or PND/Orthopnea. Appetite ok. No fever or chills. Weight at home 169 pounds. Taking all medications. Took torsemide last week.  Cardiac Studies: - Echo (4/23): EF 30-35%, RV severely dilated with severely reduced function, inferolateral akinesis, moderate to severe TR, moderate to severe AS.   - R/LHC (4/23): CAD with interval  occlusion of ostial RCA with associate large RV infarct, boderline 60% LM lesion, mild AS, hemodynamics c/w with severe RV failure and severe TR    Ost RCA to Prox RCA lesion is 100% stenosed.   Ost LM lesion is 60% stenosed.   Ost Cx lesion is 50% stenosed.   Prox LAD to Mid LAD lesion is 60% stenosed.   Ramus lesion is 90% stenosed.  On dobutamine 2.5   Ao = 83/53 (67) LV = 92/14 RA = 19 (with prominent v- waves) RV = 28/18 PA = 27/11 (19) PCW = 5 Fick cardiac output/index = 4.1/2.3 PVR = 3.4 WU FA sat = 88% PA sat = 43%, 49% PAPi = 0.8 AoV gradient peak to peak 2.20mHG on pullback    Past Medical History:  Diagnosis Date   GERD (gastroesophageal reflux disease)    Hyperlipidemia    Obesity    Pre-diabetes    Current Outpatient Medications  Medication Sig Dispense Refill   acetaminophen (TYLENOL) 500 MG tablet Take 500 mg by mouth every 6 (six) hours as needed for mild pain or headache.     aspirin 81 MG EC tablet Take 81 mg by mouth every evening.     atorvastatin (LIPITOR) 80 MG tablet Take 1 tablet (80 mg total) by mouth daily. 30 tablet 0   Carboxymethylcellulose Sodium (DRY EYE RELIEF OP) Place 1 drop into both eyes 4 (four) times daily as needed (dryness).     clopidogrel (PLAVIX) 75 MG tablet Take 1 tablet (75 mg total) by mouth daily. 31 tablet 2   Coenzyme Q10 (CO Q-10)  200 MG CAPS Take 200 mg by mouth daily.     Cyanocobalamin (VITAMIN B 12 PO) Take 1 tablet by mouth daily.     digoxin (LANOXIN) 0.125 MG tablet Take 1 tablet (0.125 mg total) by mouth daily. 31 tablet 2   empagliflozin (JARDIANCE) 10 MG TABS tablet Take 1 tablet (10 mg total) by mouth daily. 31 tablet 1   meclizine (ANTIVERT) 25 MG tablet Take 25 mg by mouth daily.     metoprolol succinate (TOPROL XL) 25 MG 24 hr tablet Take 1/2 tab Daily 15 tablet 11   Multiple Vitamin (MULTI-VITAMIN) tablet Take 1 tablet by mouth daily.     Pramoxine-Dimethicone (GOLD BOND INTENSIVE HEALING EX) Apply 1  application topically daily as needed (eczema).     torsemide (DEMADEX) 20 MG tablet Take 10 mg by mouth daily as needed.     No current facility-administered medications for this encounter.   Allergies  Allergen Reactions   Timolol Shortness Of Breath   Codeine Other (See Comments)    sick   Morphine And Related Nausea And Vomiting   Other Other (See Comments)    vicryl sutures - rash/redness, delay in healing   Social History   Socioeconomic History   Marital status: Married    Spouse name: Not on file   Number of children: Not on file   Years of education: Not on file   Highest education level: Not on file  Occupational History   Occupation: retired  Tobacco Use   Smoking status: Never   Smokeless tobacco: Never  Substance and Sexual Activity   Alcohol use: No   Drug use: No   Sexual activity: Not on file  Other Topics Concern   Not on file  Social History Narrative   Not on file   Social Determinants of Health   Financial Resource Strain: Not on file  Food Insecurity: Not on file  Transportation Needs: Not on file  Physical Activity: Not on file  Stress: Not on file  Social Connections: Not on file  Intimate Partner Violence: Not on file   Family History  Problem Relation Age of Onset   CAD Neg Hx    BP 112/80   Pulse 80   Wt 79.1 kg (174 lb 6.4 oz)   SpO2 92%   BMI 37.74 kg/m   Wt Readings from Last 3 Encounters:  05/19/22 79.1 kg (174 lb 6.4 oz)  04/17/22 81.2 kg (179 lb)  03/17/22 81.6 kg (179 lb 12.8 oz)   PHYSICAL EXAM: General:  NAD. No resp difficulty, walked into clinic with RW HEENT: Normal Neck: Supple. + v waves, JVP difficult but does not appear elevated. Carotids 2+ bilat; no bruits. No lymphadenopathy or thryomegaly appreciated. Cor: PMI nondisplaced. Regular rate & rhythm. No rubs, gallops, 2/6 AS, 2/6 TR Lungs: Clear Abdomen: Obese, soft, nontender, nondistended. No hepatosplenomegaly. No bruits or masses. Good bowel  sounds. Extremities: No cyanosis, clubbing, rash, edema Neuro: Alert & oriented x 3, cranial nerves grossly intact. Moves all 4 extremities w/o difficulty. Affect pleasant.  ASSESSMENT & PLAN: 1. CAD with Inferior STEMI: - Late presentation MI, totally occluded RCA with RV infarct as consequence.  - LHC (4/23): 100% RCA, 60% LM, 50% Lcx, 60% p to m LAD, 90% Ramus. Manage medically. - No chest pain. - Continue DAPT/statin/beta blocker. - Arrange CR now that she has finished HH PT.   2. Chronic biventricular heart failure: - EF down to 30-35% in 12/22 in setting of COVID-19  infection.  - LV function subsequently recovered, Echo (3/23): EF up to 50-55%  - Echo (4/23): EF 30-35%, RV severely reduced, moderate to severe TR, moderate to severe aortic valve stenosis with mean gradient 12 mmHg and AVA 0.85 cm2 - RHC (4/23) on 2.5 DBA: severe RV failure and severe TR - RA 19 (prominent v waves), PCWP 5, Fick CO 4.1/Index 2.3, PAPi 2.8 - JVP elevated in setting of severe TR.  - NYHA II. Volume looks OK on exam, weight stable.  - GDMT limited by hyperkalemia. - Restart spiro 12.5 mg daily. - Continue Toprol XL 12.5 mg daily. - Continue Jardiance 10 mg daily. - Continue digoxin 0.125 mg daily. Dig level 0.6 on 03/17/22 - Continue torsemide 10 mg daily PRN. - Off Entresto with hyperkalemia. Plan to add back as able. - Continue to weigh daily. - BMET/BNP today; BMET and dig trough in 1 week.   3. Tricuspid Regurgitation: - Moderate to severe on echo. - RHC consistent with severe TR. - Medical management for now.  - Hopefully will improve as RV improves, otherwise can consider clip   4. Aortic valve stenosis: - Mild to moderate.   5. Mobitz II second degree AV block: - EP has seen, no indications for pacemaker. It was felt that this would likely recover with time.   6. Prediabetes: - A1c 6.3%. - Continue SGLT2i.   7. Physical deconditioning: - Improving. CR as above. - Daughter lives  with her.   Follow up in 3 weeks with PharmD (add ARB/ARNi if able ) and keep follow up in 2 months with Dr. Haroldine Laws + echo.  Allena Katz, FNP-BC 05/19/22

## 2022-05-20 IMAGING — US US ABDOMEN LIMITED
1 series · 14 of 25 positions shown · non-contrast
Comparison: None.

CLINICAL DATA: Elevated LFTs

EXAM:
ULTRASOUND ABDOMEN LIMITED RIGHT UPPER QUADRANT

[Series 1: us abdomen limited ruq (liver/gb) · 14 of 72 slices shown]
[im 1/72]
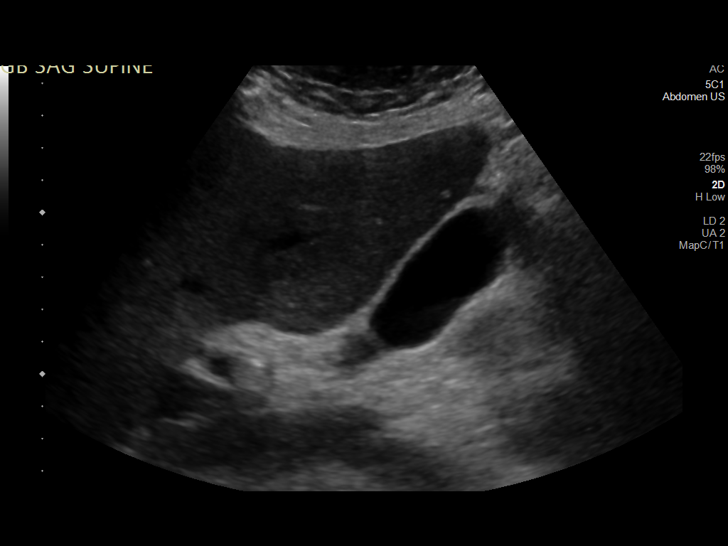
[im 6/72]
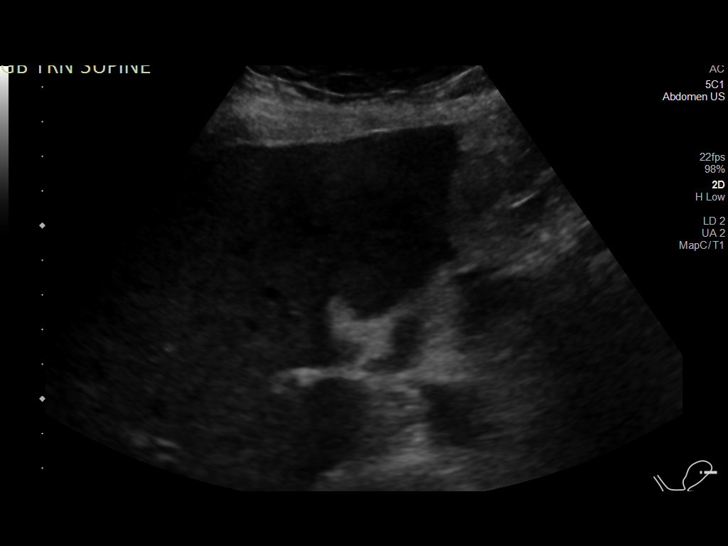
[im 12/72]
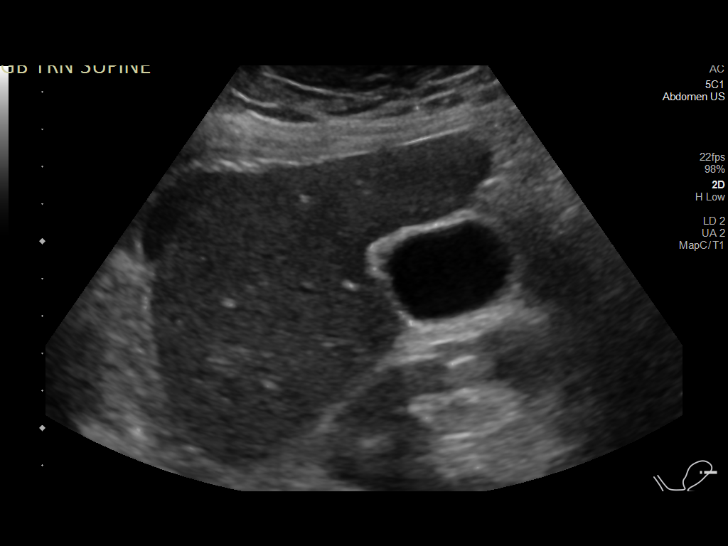
[im 18/72]
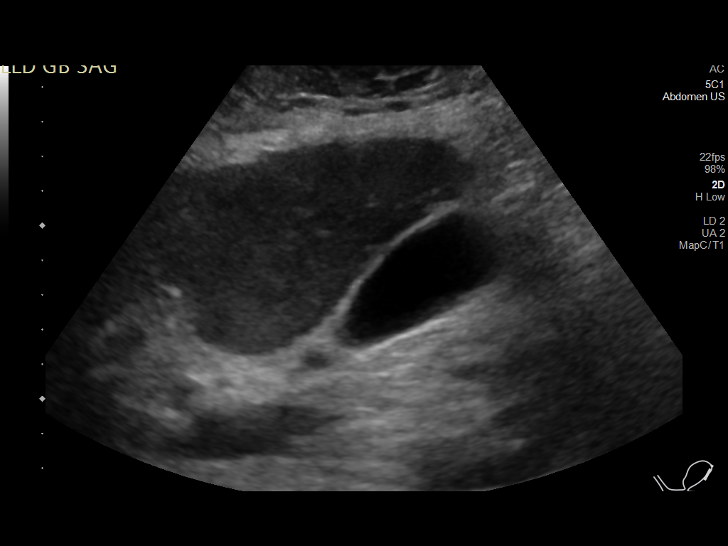
[im 24/72]
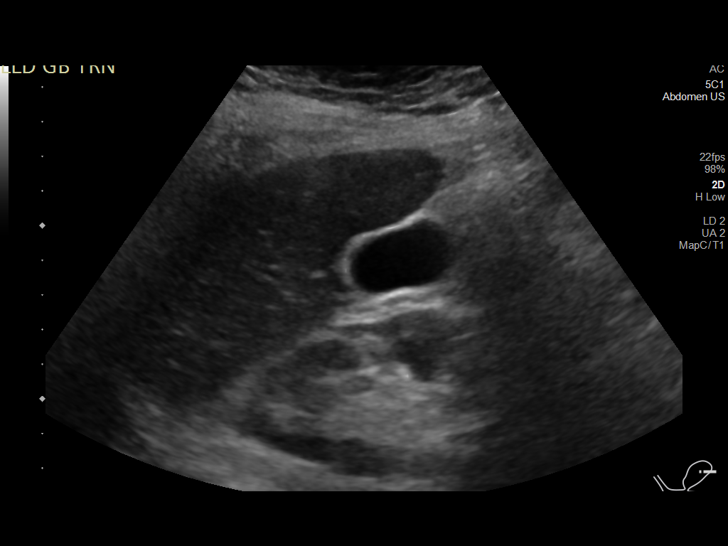
[im 27/72]
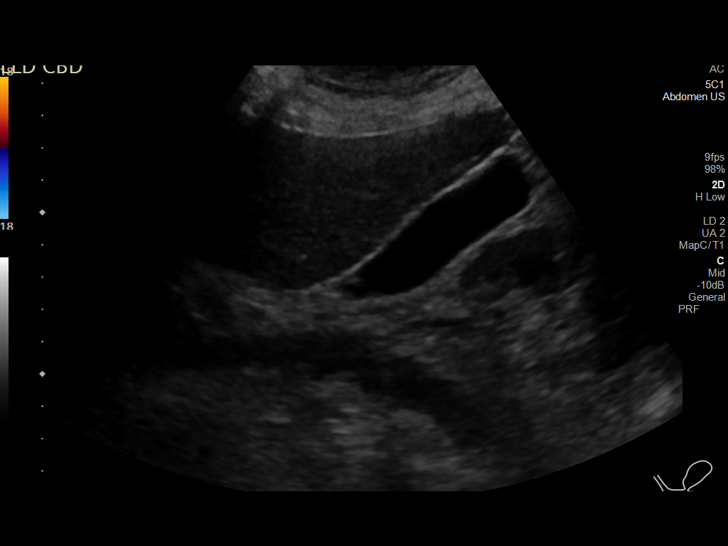
[im 33/72]
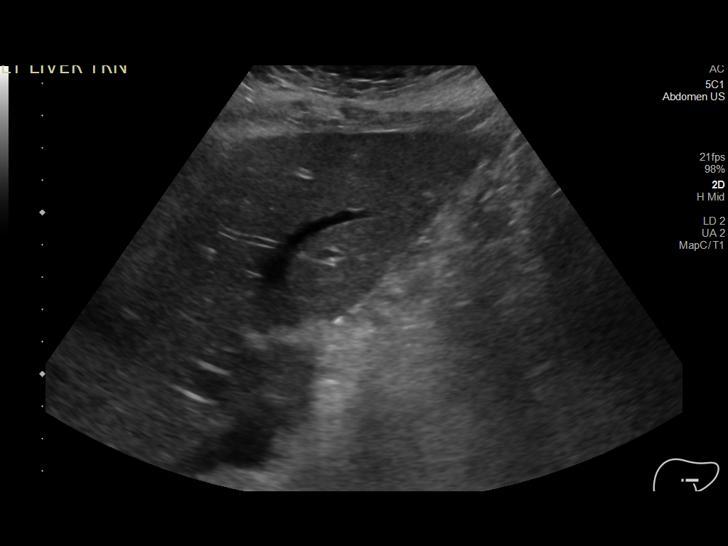
[im 39/72]
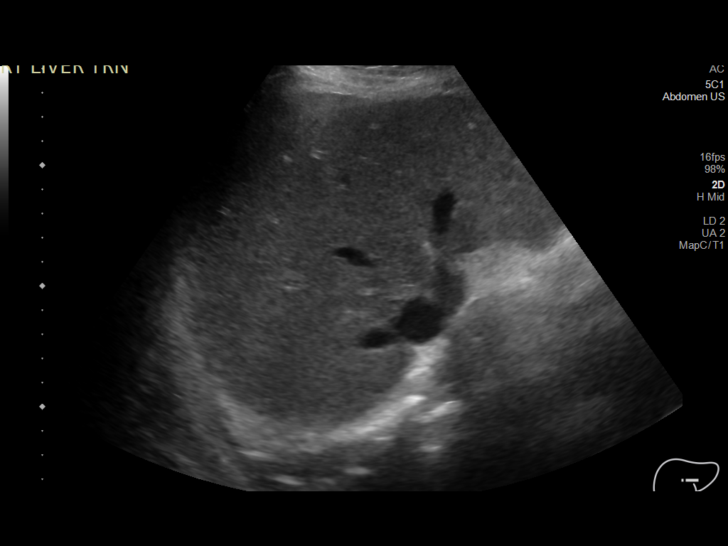
[im 45/72]
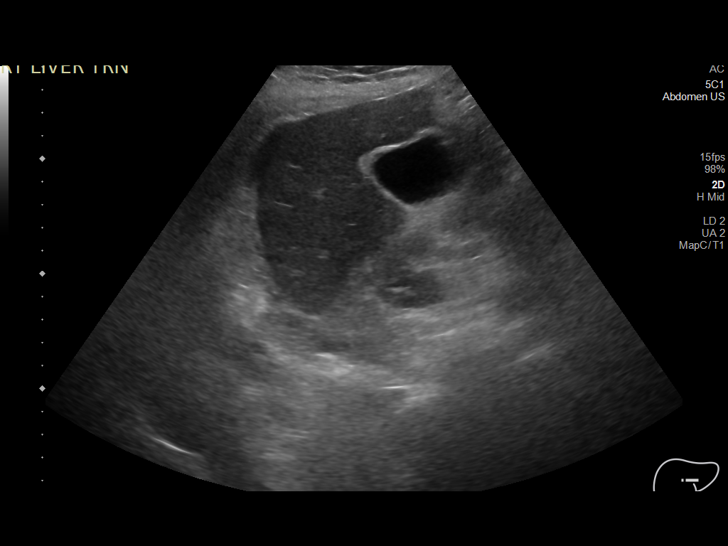
[im 48/72]
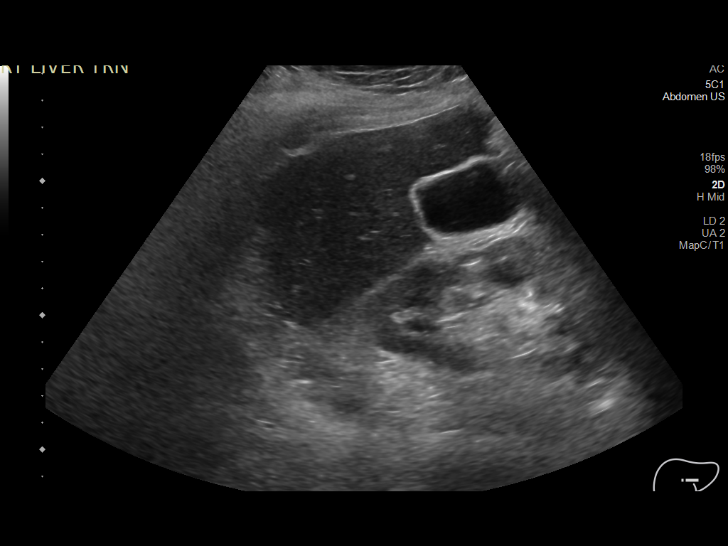
[im 54/72]
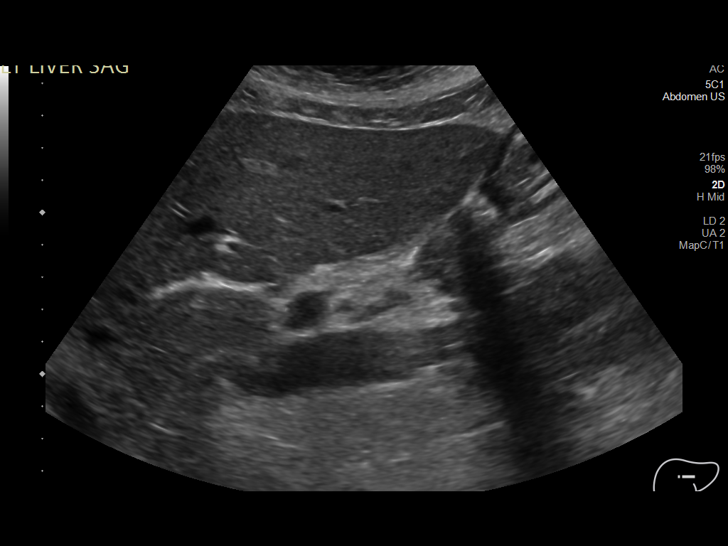
[im 60/72]
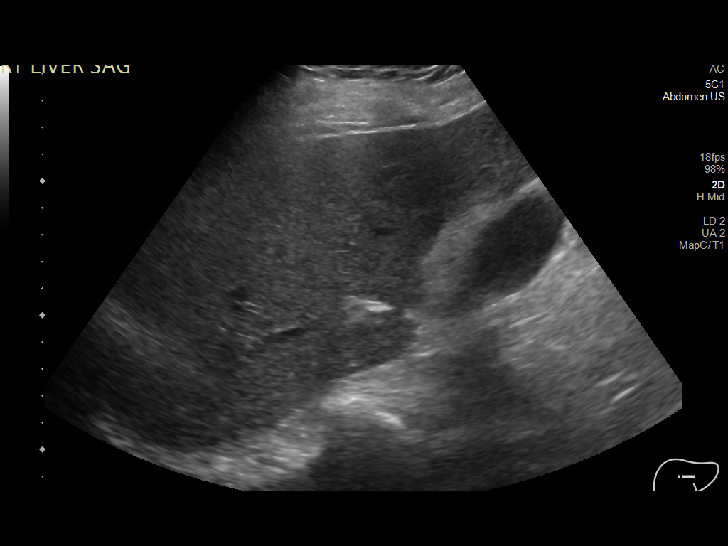
[im 66/72]
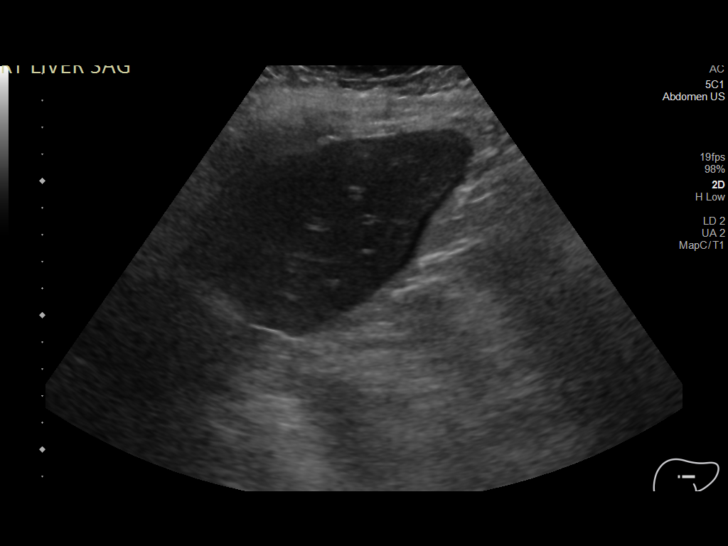
[im 72/72]
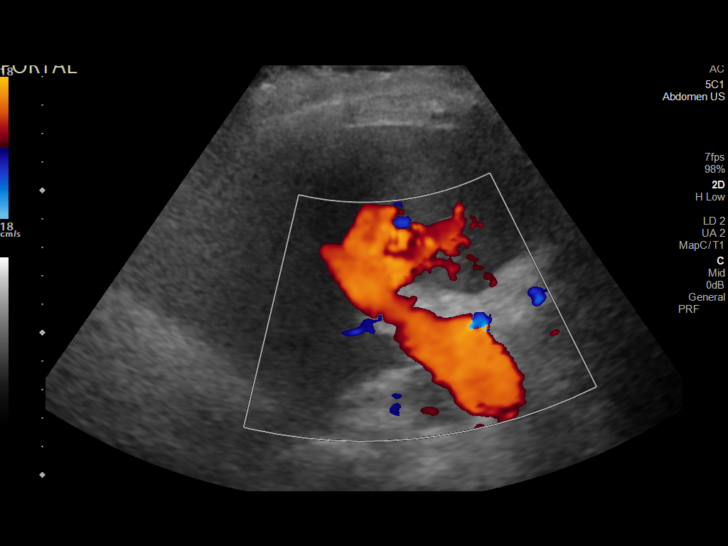

[14 of 25 positions shown; findings below may reference images not displayed]

FINDINGS: Gallbladder:

No gallstones or wall thickening visualized. No sonographic Murphy
sign noted by sonographer.

Common bile duct:

Diameter: 3.3 mL

Liver:

No focal lesion identified. Within normal limits in parenchymal
echogenicity. Portal vein is patent on color Doppler imaging with
normal direction of blood flow towards the liver.

Other: Trace ascites.
IMPRESSION: 1. Normal sonographic appearance of the gallbladder and liver.
2. Trace ascites.

## 2022-05-21 ENCOUNTER — Telehealth (HOSPITAL_COMMUNITY): Payer: Self-pay

## 2022-05-21 ENCOUNTER — Encounter (HOSPITAL_COMMUNITY): Payer: Self-pay

## 2022-05-21 NOTE — Telephone Encounter (Signed)
Unable to speak with pt in regards to cardiac rehab, pt's daughter answered the phone and stated that she was in charge of her mothers medical information. I advised pt's daughter that the pt did not have a DPR form on file and I was unable to release any of her moms medical information to her. Pt's daughter was upset and didn't understand she stated that people from [facility] has been releasing information to her for 8 months or so. I advised pt that pt would have to fill out and sign a DPR form. Pt's daughter stated that she will pass our phone number to her and have her call cardiac rehab.

## 2022-05-27 ENCOUNTER — Other Ambulatory Visit (HOSPITAL_COMMUNITY): Payer: Self-pay | Admitting: *Deleted

## 2022-05-27 MED ORDER — EMPAGLIFLOZIN 10 MG PO TABS: 10.0000 mg | ORAL_TABLET | Freq: Every day | ORAL | 11 refills | Status: DC

## 2022-05-29 ENCOUNTER — Other Ambulatory Visit (HOSPITAL_COMMUNITY): Payer: Medicare HMO

## 2022-06-03 ENCOUNTER — Telehealth (HOSPITAL_COMMUNITY): Payer: Self-pay

## 2022-06-03 NOTE — Telephone Encounter (Signed)
Pt daughter Justice Rocher called wanting to know about a message she received for the pt to participate in the cardiac rehab program. I advised pt's daughter that I could give general information to her but could not release any medical information to her because pt did not have a DPR form on file for me to release any information. Pt's daughter Justice Rocher got a little upset and asked how she go through the process of getting the DPR form signed. I advised her that at her next appt or she could just go to her [facility] doc office and request a DPR form for the pt to fill out and sign and that  they will scan form into pt's chart. She stated that she was going to do that.

## 2022-06-12 ENCOUNTER — Inpatient Hospital Stay (HOSPITAL_COMMUNITY): Admission: RE | Admit: 2022-06-12 | Payer: Medicare HMO | Source: Ambulatory Visit

## 2022-06-24 ENCOUNTER — Other Ambulatory Visit (HOSPITAL_COMMUNITY): Payer: Self-pay

## 2022-07-04 DIAGNOSIS — E119 Type 2 diabetes mellitus without complications: Secondary | ICD-10-CM | POA: Diagnosis not present

## 2022-07-04 DIAGNOSIS — E7800 Pure hypercholesterolemia, unspecified: Secondary | ICD-10-CM | POA: Diagnosis not present

## 2022-07-04 DIAGNOSIS — I5041 Acute combined systolic (congestive) and diastolic (congestive) heart failure: Secondary | ICD-10-CM | POA: Diagnosis not present

## 2022-07-04 DIAGNOSIS — I350 Nonrheumatic aortic (valve) stenosis: Secondary | ICD-10-CM | POA: Diagnosis not present

## 2022-07-04 DIAGNOSIS — I2510 Atherosclerotic heart disease of native coronary artery without angina pectoris: Secondary | ICD-10-CM | POA: Diagnosis not present

## 2022-07-04 DIAGNOSIS — H8110 Benign paroxysmal vertigo, unspecified ear: Secondary | ICD-10-CM | POA: Diagnosis not present

## 2022-07-09 ENCOUNTER — Encounter (HOSPITAL_COMMUNITY): Payer: Self-pay

## 2022-07-10 ENCOUNTER — Telehealth (HOSPITAL_COMMUNITY): Payer: Self-pay

## 2022-07-10 NOTE — Telephone Encounter (Signed)
Pt insurance is active and benefits verified through [place] $10, DED 0/0 met, out of pocket $3,400/$2,212.62 met, co-insurance 0%. no pre-authorization required. Passport, 07/10/2022@10 :35am, REF# [id number]   How many CR sessions are covered? (TCR, 72 sessions for ICR)72 Is this a lifetime maximum or an annual maximum? annual Has the member used any of these services to date? no Is there a time limit (weeks/months) on start of program and/or program completion? no

## 2022-07-15 ENCOUNTER — Telehealth (HOSPITAL_COMMUNITY): Payer: Self-pay

## 2022-07-16 ENCOUNTER — Telehealth (HOSPITAL_COMMUNITY): Payer: Self-pay | Admitting: *Deleted

## 2022-07-16 NOTE — Telephone Encounter (Signed)
Patients daughter states she is physically unable to walk patient to unit, she will loop around to education entrance, she will be driving a Arts development officer.  Albertine Grates RN 8/30/20231:59 PM

## 2022-07-17 ENCOUNTER — Encounter (HOSPITAL_COMMUNITY)
Admission: RE | Admit: 2022-07-17 | Discharge: 2022-07-17 | Disposition: A | Discharge status: Home / Self Care | Payer: Medicare HMO | Source: Ambulatory Visit | Attending: Internal Medicine | Admitting: Internal Medicine

## 2022-07-17 ENCOUNTER — Encounter (HOSPITAL_COMMUNITY): Payer: Self-pay

## 2022-07-17 VITALS — BP 108/78 | HR 93 | Ht <= 58 in | Wt 168.0 lb

## 2022-07-17 DIAGNOSIS — I5022 Chronic systolic (congestive) heart failure: Secondary | ICD-10-CM | POA: Diagnosis not present

## 2022-07-17 DIAGNOSIS — I213 ST elevation (STEMI) myocardial infarction of unspecified site: Secondary | ICD-10-CM | POA: Diagnosis not present

## 2022-07-17 HISTORY — DX: Atherosclerotic heart disease of native coronary artery without angina pectoris: I25.10

## 2022-07-17 HISTORY — DX: Heart failure, unspecified: I50.9

## 2022-07-17 NOTE — Progress Notes (Addendum)
Patient here for cardiac rehab orientation. Completed 6 minute nustep test due to deconditioning. Intermittent PVC, non sustained trigeminy, one couplet noted. Patient asymptomatic. Blood pressure 110/70 heart rate 73 post walk test. Dr Bensimhon's office was called and notified.Will fax exercise flow sheets to Dr. Clayborne Dana office for review.Barnet Pall, RN,BSN 07/17/2022 12:03 PM

## 2022-07-17 NOTE — Progress Notes (Signed)
Cardiac Individual Treatment Plan  Patient Details  Name: Amy [name] MRN: [id number] Date of Birth: 10/14/1946 Referring Provider:   Flowsheet Row INTENSIVE CARDIAC REHAB ORIENT from 07/17/2022 in [facility]  Referring Provider Dr. Glori Bickers MD       Initial Encounter Date:  [place] from 07/17/2022 in [facility]  Date 07/17/22       Visit Diagnosis: ST elevation myocardial infarction (STEMI), unspecified artery (HCC)  Heart failure, chronic systolic (HCC)  Patient's Home Medications on Admission:  Current Outpatient Medications:    acetaminophen (TYLENOL) 500 MG tablet, Take 500 mg by mouth every 6 (six) hours as needed for mild pain or headache., Disp: , Rfl:    aspirin 81 MG EC tablet, Take 81 mg by mouth every evening., Disp: , Rfl:    atorvastatin (LIPITOR) 80 MG tablet, Take 1 tablet (80 mg total) by mouth daily., Disp: 30 tablet, Rfl: 0   Carboxymethylcellulose Sodium (DRY EYE RELIEF OP), Place 1 drop into both eyes 4 (four) times daily as needed (dryness)., Disp: , Rfl:    clopidogrel (PLAVIX) 75 MG tablet, Take 1 tablet (75 mg total) by mouth daily., Disp: 31 tablet, Rfl: 2   Coenzyme Q10 (CO Q-10) 200 MG CAPS, Take 200 mg by mouth daily., Disp: , Rfl:    Cyanocobalamin (VITAMIN B 12 PO), Take 1 tablet by mouth daily., Disp: , Rfl:    digoxin (LANOXIN) 0.125 MG tablet, Take 1 tablet (0.125 mg total) by mouth daily., Disp: 31 tablet, Rfl: 2   empagliflozin (JARDIANCE) 10 MG TABS tablet, Take 1 tablet (10 mg total) by mouth daily., Disp: 30 tablet, Rfl: 11   meclizine (ANTIVERT) 25 MG tablet, Take 25 mg by mouth daily., Disp: , Rfl:    metoprolol succinate (TOPROL XL) 25 MG 24 hr tablet, Take 1/2 tab Daily, Disp: 15 tablet, Rfl: 11   Multiple Vitamin (MULTI-VITAMIN) tablet, Take 1 tablet by mouth daily., Disp: , Rfl:    Pramoxine-Dimethicone (GOLD BOND INTENSIVE  HEALING EX), Apply 1 application topically daily as needed (eczema)., Disp: , Rfl:    Prasterone, DHEA, (DHEA PO), Take by mouth., Disp: , Rfl:    torsemide (DEMADEX) 20 MG tablet, Take 10 mg by mouth daily as needed (fluid retention.)., Disp: , Rfl:   Past Medical History: Past Medical History:  Diagnosis Date   CHF (congestive heart failure) (HCC)    Coronary artery disease    GERD (gastroesophageal reflux disease)    Hyperlipidemia    Obesity    Pre-diabetes     Tobacco Use: Social History   Tobacco Use  Smoking Status Never  Smokeless Tobacco Never    Labs: Review Flowsheet  More data exists      Latest Ref Rng & Units 03/01/2022 03/02/2022 03/03/2022 03/04/2022 03/05/2022  Labs for ITP Cardiac and Pulmonary Rehab  O2 Saturation % 72.5  72.6  58.4  62.3  66.1     Capillary Blood Glucose: Lab Results  Component Value Date   GLUCAP 184 (H) 03/05/2022   GLUCAP 96 03/05/2022   GLUCAP 131 (H) 03/05/2022   GLUCAP 153 (H) 03/04/2022   GLUCAP 120 (H) 03/04/2022     Exercise Target Goals: Exercise Program Goal: Individual exercise prescription set using results from initial 6 min walk test and THRR while considering  patient's activity barriers and safety.   Exercise Prescription Goal: Initial exercise prescription builds to 30-45 minutes a day  of aerobic activity, 2-3 days per week.  Home exercise guidelines will be given to patient during program as part of exercise prescription that the participant will acknowledge.  Activity Barriers & Risk Stratification:  Activity Barriers & Cardiac Risk Stratification - 07/17/22 1024       Activity Barriers & Cardiac Risk Stratification   Activity Barriers Neck/Spine Problems;Joint Problems;Balance Concerns;Deconditioning;Muscular Weakness;History of Falls;Left Knee Replacement;Right Knee Replacement    Cardiac Risk Stratification High             6 Minute Walk:  6 Minute Walk     Row Name 07/17/22 1009         6  Minute Walk   Phase Initial  Nustep test: poor balance, dizziness and assistive device     Distance 886 feet     Walk Time 6 minutes     # of Rest Breaks 0     MPH 1.68     METS 1.2     RPE 9     Perceived Dyspnea  0     VO2 Peak 4.2     Symptoms No     Resting HR 93 bpm     Resting BP 108/78     Resting Oxygen Saturation  95 %     Exercise Oxygen Saturation  during 6 min walk 94 %     Max Ex. HR 102 bpm     Max Ex. BP 114/74     2 Minute Post BP 110/74              Oxygen Initial Assessment:   Oxygen Re-Evaluation:   Oxygen Discharge (Final Oxygen Re-Evaluation):   Initial Exercise Prescription:  Initial Exercise Prescription - 07/17/22 1000       Date of Initial Exercise RX and Referring Provider   Date 07/17/22    Referring Provider Dr. Glori Bickers MD    Expected Discharge Date 09/12/22      NuStep   Level 1    SPM 53    Minutes 20    METs 1.3      Prescription Details   Frequency (times per week) 2    Duration Progress to 30 minutes of continuous aerobic without signs/symptoms of physical distress      Intensity   THRR 40-80% of Max Heartrate 58-116    Ratings of Perceived Exertion 11-13    Perceived Dyspnea 0-4      Progression   Progression Continue progressive overload as per policy without signs/symptoms or physical distress.      Resistance Training   Training Prescription Yes    Weight 2    Reps 10-15             Perform Capillary Blood Glucose checks as needed.  Exercise Prescription Changes:   Exercise Comments:   Exercise Goals and Review:   Exercise Goals     Row Name 07/17/22 1030             Exercise Goals   Increase Physical Activity Yes       Intervention Provide advice, education, support and counseling about physical activity/exercise needs.;Develop an individualized exercise prescription for aerobic and resistive training based on initial evaluation findings, risk stratification, comorbidities and  participant's personal goals.       Expected Outcomes Short Term: Attend rehab on a regular basis to increase amount of physical activity.;Long Term: Add in home exercise to make exercise part of routine and to increase amount of physical activity.;Long  Term: Exercising regularly at least 3-5 days a week.       Increase Strength and Stamina Yes       Intervention Provide advice, education, support and counseling about physical activity/exercise needs.;Develop an individualized exercise prescription for aerobic and resistive training based on initial evaluation findings, risk stratification, comorbidities and participant's personal goals.       Expected Outcomes Short Term: Increase workloads from initial exercise prescription for resistance, speed, and METs.;Short Term: Perform resistance training exercises routinely during rehab and add in resistance training at home;Long Term: Improve cardiorespiratory fitness, muscular endurance and strength as measured by increased METs and functional capacity (6MWT)       Able to understand and use rate of perceived exertion (RPE) scale Yes       Intervention Provide education and explanation on how to use RPE scale       Expected Outcomes Short Term: Able to use RPE daily in rehab to express subjective intensity level;Long Term:  Able to use RPE to guide intensity level when exercising independently       Knowledge and understanding of Target Heart Rate Range (THRR) Yes       Intervention Provide education and explanation of THRR including how the numbers were predicted and where they are located for reference       Expected Outcomes Short Term: Able to state/look up THRR;Long Term: Able to use THRR to govern intensity when exercising independently;Short Term: Able to use daily as guideline for intensity in rehab       Understanding of Exercise Prescription Yes       Intervention Provide education, explanation, and written materials on patient's individual exercise  prescription       Expected Outcomes Short Term: Able to explain program exercise prescription;Long Term: Able to explain home exercise prescription to exercise independently                Exercise Goals Re-Evaluation :   Discharge Exercise Prescription (Final Exercise Prescription Changes):   Nutrition:  Target Goals: Understanding of nutrition guidelines, daily intake of sodium '1500mg'$ , cholesterol '200mg'$ , calories 30% from fat and 7% or less from saturated fats, daily to have 5 or more servings of fruits and vegetables.  Biometrics:  Pre Biometrics - 07/17/22 1031       Pre Biometrics   Height '4\' 9"'$  (1.448 m)    Weight 76.2 kg    Waist Circumference 42.5 inches    Hip Circumference 42.75 inches    Waist to Hip Ratio 0.99 %    BMI (Calculated) 36.34    Triceps Skinfold 38 mm    % Body Fat 50.2 %    Grip Strength 13 kg    Flexibility --   Did not attempt. mobility issues   Single Leg Stand --   Did not attempt, assistive device, previous falls and balance issues             Nutrition Therapy Plan and Nutrition Goals:   Nutrition Assessments:  MEDIFICTS Score Key: ?70 Need to make dietary changes  40-70 Heart Healthy Diet ? 40 Therapeutic Level Cholesterol Diet    Picture Your Plate Scores: <60 Unhealthy dietary pattern with much room for improvement. 41-50 Dietary pattern unlikely to meet recommendations for good health and room for improvement. 51-60 More healthful dietary pattern, with some room for improvement.  >60 Healthy dietary pattern, although there may be some specific behaviors that could be improved.    Nutrition Goals Re-Evaluation:  Nutrition Goals Re-Evaluation:   Nutrition Goals Discharge (Final Nutrition Goals Re-Evaluation):   Psychosocial: Target Goals: Acknowledge presence or absence of significant depression and/or stress, maximize coping skills, provide positive support system. Participant is able to verbalize types and  ability to use techniques and skills needed for reducing stress and depression.  Initial Review & Psychosocial Screening:  Initial Psych Review & Screening - 07/17/22 1226       Initial Review   Current issues with None Identified      Family Dynamics   Good Support System? Yes   Jessieca lives with her daughter     Barriers   Psychosocial barriers to participate in program There are no identifiable barriers or psychosocial needs.      Screening Interventions   Interventions Encouraged to exercise             Quality of Life Scores:  Quality of Life - 07/17/22 1035       Quality of Life   Select Quality of Life      Quality of Life Scores   Health/Function Pre 25.62 %    Socioeconomic Pre 28.5 %    Psych/Spiritual Pre 29.14 %    Family Pre 24.38 %    GLOBAL Pre 26.79 %            Scores of 19 and below usually indicate a poorer quality of life in these areas.  A difference of  2-3 points is a clinically meaningful difference.  A difference of 2-3 points in the total score of the Quality of Life Index has been associated with significant improvement in overall quality of life, self-image, physical symptoms, and general health in studies assessing change in quality of life.  PHQ-9: Review Flowsheet       07/17/2022  Depression screen PHQ 2/9  Decreased Interest 0  Down, Depressed, Hopeless 0  PHQ - 2 Score 0   Interpretation of Total Score  Total Score Depression Severity:  1-4 = Minimal depression, 5-9 = Mild depression, 10-14 = Moderate depression, 15-19 = Moderately severe depression, 20-27 = Severe depression   Psychosocial Evaluation and Intervention:   Psychosocial Re-Evaluation:   Psychosocial Discharge (Final Psychosocial Re-Evaluation):   Vocational Rehabilitation: Provide vocational rehab assistance to qualifying candidates.   Vocational Rehab Evaluation & Intervention:  Vocational Rehab - 07/17/22 1227       Initial Vocational Rehab  Evaluation & Intervention   Assessment shows need for Vocational Rehabilitation No   Rital is retired and does not need vocational rehab at this time            Education: Education Goals: Education classes will be provided on a weekly basis, covering required topics. Participant will state understanding/return demonstration of topics presented.     Core Videos: Exercise    Move It!  Clinical staff conducted group or individual video education with verbal and written material and guidebook.  Patient learns the recommended Pritikin exercise program. Exercise with the goal of living a long, healthy life. Some of the health benefits of exercise include controlled diabetes, healthier blood pressure levels, improved cholesterol levels, improved heart and lung capacity, improved sleep, and better body composition. Everyone should speak with their doctor before starting or changing an exercise routine.  Biomechanical Limitations Clinical staff conducted group or individual video education with verbal and written material and guidebook.  Patient learns how biomechanical limitations can impact exercise and how we can mitigate and possibly overcome limitations to have an impactful and  balanced exercise routine.  Body Composition Clinical staff conducted group or individual video education with verbal and written material and guidebook.  Patient learns that body composition (ratio of muscle mass to fat mass) is a key component to assessing overall fitness, rather than body weight alone. Increased fat mass, especially visceral belly fat, can put [place] at increased risk for metabolic syndrome, type 2 diabetes, heart disease, and even death. It is recommended to combine diet and exercise (cardiovascular and resistance training) to improve your body composition. Seek guidance from your physician and exercise physiologist before implementing an exercise routine.  Exercise Action Plan Clinical staff  conducted group or individual video education with verbal and written material and guidebook.  Patient learns the recommended strategies to achieve and enjoy long-term exercise adherence, including variety, self-motivation, self-efficacy, and positive decision making. Benefits of exercise include fitness, good health, weight management, more energy, better sleep, less stress, and overall well-being.  Medical   Heart Disease Risk Reduction Clinical staff conducted group or individual video education with verbal and written material and guidebook.  Patient learns our heart is our most vital organ as it circulates oxygen, nutrients, white blood cells, and hormones throughout the entire body, and carries waste away. Data supports a plant-based eating plan like the Pritikin Program for its effectiveness in slowing progression of and reversing heart disease. The video provides a number of recommendations to address heart disease.   Metabolic Syndrome and Belly Fat  Clinical staff conducted group or individual video education with verbal and written material and guidebook.  Patient learns what metabolic syndrome is, how it leads to heart disease, and how one can reverse it and keep it from coming back. You have metabolic syndrome if you have 3 of the following 5 criteria: abdominal obesity, high blood pressure, high triglycerides, low HDL cholesterol, and high blood sugar.  Hypertension and Heart Disease Clinical staff conducted group or individual video education with verbal and written material and guidebook.  Patient learns that high blood pressure, or hypertension, is very common in the [place]. Hypertension is largely due to excessive salt intake, but other important risk factors include being overweight, physical inactivity, drinking too much alcohol, smoking, and not eating enough potassium from fruits and vegetables. High blood pressure is a leading risk factor for heart attack, stroke,  congestive heart failure, dementia, kidney failure, and premature death. Long-term effects of excessive salt intake include stiffening of the arteries and thickening of heart muscle and organ damage. Recommendations include ways to reduce hypertension and the risk of heart disease.  Diseases of Our Time - Focusing on Diabetes Clinical staff conducted group or individual video education with verbal and written material and guidebook.  Patient learns why the best way to stop diseases of our time is prevention, through food and other lifestyle changes. Medicine (such as prescription pills and surgeries) is often only a Band-Aid on the problem, not a long-term solution. Most common diseases of our time include obesity, type 2 diabetes, hypertension, heart disease, and cancer. The Pritikin Program is recommended and has been proven to help reduce, reverse, and/or prevent the damaging effects of metabolic syndrome.  Nutrition   Overview of the Pritikin Eating Plan  Clinical staff conducted group or individual video education with verbal and written material and guidebook.  Patient learns about the [place] for disease risk reduction. The [place] emphasizes a wide variety of unrefined, minimally-processed carbohydrates, like fruits, vegetables, whole grains, and legumes. Go, Caution, and  Stop food choices are explained. Plant-based and lean animal proteins are emphasized. Rationale provided for low sodium intake for blood pressure control, low added sugars for blood sugar stabilization, and low added fats and oils for coronary artery disease risk reduction and weight management.  Calorie Density  Clinical staff conducted group or individual video education with verbal and written material and guidebook.  Patient learns about calorie density and how it impacts the Pritikin Eating Plan. Knowing the characteristics of the food you choose will help you decide whether those foods will lead  to weight gain or weight loss, and whether you want to consume more or less of them. Weight loss is usually a side effect of the Pritikin Eating Plan because of its focus on low calorie-dense foods.  Label Reading  Clinical staff conducted group or individual video education with verbal and written material and guidebook.  Patient learns about the Pritikin recommended label reading guidelines and corresponding recommendations regarding calorie density, added sugars, sodium content, and whole grains.  Dining Out - Part 1  Clinical staff conducted group or individual video education with verbal and written material and guidebook.  Patient learns that restaurant meals can be sabotaging because they can be so high in calories, fat, sodium, and/or sugar. Patient learns recommended strategies on how to positively address this and avoid unhealthy pitfalls.  Facts on Fats  Clinical staff conducted group or individual video education with verbal and written material and guidebook.  Patient learns that lifestyle modifications can be just as effective, if not more so, as many medications for lowering your risk of heart disease. A Pritikin lifestyle can help to reduce your risk of inflammation and atherosclerosis (cholesterol build-up, or plaque, in the artery walls). Lifestyle interventions such as dietary choices and physical activity address the cause of atherosclerosis. A review of the types of fats and their impact on blood cholesterol levels, along with dietary recommendations to reduce fat intake is also included.  Nutrition Action Plan  Clinical staff conducted group or individual video education with verbal and written material and guidebook.  Patient learns how to incorporate Pritikin recommendations into their lifestyle. Recommendations include planning and keeping personal health goals in mind as an important part of their success.  Healthy Mind-Set    Healthy Minds, Bodies, Hearts  Clinical  staff conducted group or individual video education with verbal and written material and guidebook.  Patient learns how to identify when they are stressed. Video will discuss the impact of that stress, as well as the many benefits of stress management. Patient will also be introduced to stress management techniques. The way we think, act, and feel has an impact on our hearts.  How Our Thoughts Can Heal Our Hearts  Clinical staff conducted group or individual video education with verbal and written material and guidebook.  Patient learns that negative thoughts can cause depression and anxiety. This can result in negative lifestyle behavior and serious health problems. Cognitive behavioral therapy is an effective method to help control our thoughts in order to change and improve our emotional outlook.  Additional Videos:  Exercise    Improving Performance  Clinical staff conducted group or individual video education with verbal and written material and guidebook.  Patient learns to use a non-linear approach by alternating intensity levels and lengths of time spent exercising to help burn more calories and lose more body fat. Cardiovascular exercise helps improve heart health, metabolism, hormonal balance, blood sugar control, and recovery from fatigue. Resistance training improves  strength, endurance, balance, coordination, reaction time, metabolism, and muscle mass. Flexibility exercise improves circulation, posture, and balance. Seek guidance from your physician and exercise physiologist before implementing an exercise routine and learn your capabilities and proper form for all exercise.  Introduction to Yoga  Clinical staff conducted group or individual video education with verbal and written material and guidebook.  Patient learns about yoga, a discipline of the coming together of mind, breath, and body. The benefits of yoga include improved flexibility, improved range of motion, better posture and  core strength, increased lung function, weight loss, and positive self-image. Yoga's heart health benefits include lowered blood pressure, healthier heart rate, decreased cholesterol and triglyceride levels, improved immune function, and reduced stress. Seek guidance from your physician and exercise physiologist before implementing an exercise routine and learn your capabilities and proper form for all exercise.  Medical   Aging: Enhancing Your Quality of Life  Clinical staff conducted group or individual video education with verbal and written material and guidebook.  Patient learns key strategies and recommendations to stay in good physical health and enhance quality of life, such as prevention strategies, having an advocate, securing a [place], and keeping a list of medications and system for tracking them. It also discusses how to avoid risk for bone loss.  Biology of Weight Control  Clinical staff conducted group or individual video education with verbal and written material and guidebook.  Patient learns that weight gain occurs because we consume more calories than we burn (eating more, moving less). Even if your body weight is normal, you may have higher ratios of fat compared to muscle mass. Too much body fat puts you at increased risk for cardiovascular disease, heart attack, stroke, type 2 diabetes, and obesity-related cancers. In addition to exercise, following the [place] can help reduce your risk.  Decoding Lab Results  Clinical staff conducted group or individual video education with verbal and written material and guidebook.  Patient learns that lab test reflects one measurement whose values change over time and are influenced by many factors, including medication, stress, sleep, exercise, food, hydration, pre-existing medical conditions, and more. It is recommended to use the knowledge from this video to become more involved with your lab  results and evaluate your numbers to speak with your doctor.   Diseases of Our Time - Overview  Clinical staff conducted group or individual video education with verbal and written material and guidebook.  Patient learns that according to the CDC, 50% to 70% of chronic diseases (such as obesity, type 2 diabetes, elevated lipids, hypertension, and heart disease) are avoidable through lifestyle improvements including healthier food choices, listening to satiety cues, and increased physical activity.  Sleep Disorders Clinical staff conducted group or individual video education with verbal and written material and guidebook.  Patient learns how good quality and duration of sleep are important to overall health and well-being. Patient also learns about sleep disorders and how they impact health along with recommendations to address them, including discussing with a physician.  Nutrition  Dining Out - Part 2 Clinical staff conducted group or individual video education with verbal and written material and guidebook.  Patient learns how to plan ahead and communicate in order to maximize their dining experience in a healthy and nutritious manner. Included are recommended food choices based on the type of restaurant the patient is visiting.   Fueling a Best boy conducted group or individual video education with  verbal and written material and guidebook.  There is a strong connection between our food choices and our health. Diseases like obesity and type 2 diabetes are very prevalent and are in large-part due to lifestyle choices. The Pritikin Eating Plan provides plenty of food and hunger-curbing satisfaction. It is easy to follow, affordable, and helps reduce health risks.  Menu Workshop  Clinical staff conducted group or individual video education with verbal and written material and guidebook.  Patient learns that restaurant meals can sabotage health goals because they are often  packed with calories, fat, sodium, and sugar. Recommendations include strategies to plan ahead and to communicate with the manager, chef, or server to help order a healthier meal.  Planning Your Eating Strategy  Clinical staff conducted group or individual video education with verbal and written material and guidebook.  Patient learns about the [place] and its benefit of reducing the risk of disease. The [place] does not focus on calories. Instead, it emphasizes high-quality, nutrient-rich foods. By knowing the characteristics of the foods, we choose, we can determine their calorie density and make informed decisions.  Targeting Your Nutrition Priorities  Clinical staff conducted group or individual video education with verbal and written material and guidebook.  Patient learns that lifestyle habits have a tremendous impact on disease risk and progression. This video provides eating and physical activity recommendations based on your personal health goals, such as reducing LDL cholesterol, losing weight, preventing or controlling type 2 diabetes, and reducing high blood pressure.  Vitamins and Minerals  Clinical staff conducted group or individual video education with verbal and written material and guidebook.  Patient learns different ways to obtain key vitamins and minerals, including through a recommended healthy diet. It is important to discuss all supplements you take with your doctor.   Healthy Mind-Set    Smoking Cessation  Clinical staff conducted group or individual video education with verbal and written material and guidebook.  Patient learns that cigarette smoking and tobacco addiction pose a serious health risk which affects millions of people. Stopping smoking will significantly reduce the risk of heart disease, lung disease, and many forms of cancer. Recommended strategies for quitting are covered, including working with your doctor to develop a successful  plan.  Culinary   Becoming a Financial trader conducted group or individual video education with verbal and written material and guidebook.  Patient learns that cooking at home can be healthy, cost-effective, quick, and puts them in control. Keys to cooking healthy recipes will include looking at your recipe, assessing your equipment needs, planning ahead, making it simple, choosing cost-effective seasonal ingredients, and limiting the use of added fats, salts, and sugars.  Cooking - Breakfast and Snacks  Clinical staff conducted group or individual video education with verbal and written material and guidebook.  Patient learns how important breakfast is to satiety and nutrition through the entire day. Recommendations include key foods to eat during breakfast to help stabilize blood sugar levels and to prevent overeating at meals later in the day. Planning ahead is also a key component.  Cooking - Human resources officer conducted group or individual video education with verbal and written material and guidebook.  Patient learns eating strategies to improve overall health, including an approach to cook more at home. Recommendations include thinking of animal protein as a side on your plate rather than center stage and focusing instead on lower calorie dense options like vegetables, fruits, whole grains, and  plant-based proteins, such as beans. Making sauces in large quantities to freeze for later and leaving the skin on your vegetables are also recommended to maximize your experience.  Cooking - Healthy Salads and Dressing Clinical staff conducted group or individual video education with verbal and written material and guidebook.  Patient learns that vegetables, fruits, whole grains, and legumes are the foundations of the [place]. Recommendations include how to incorporate each of these in flavorful and healthy salads, and how to create homemade salad dressings.  Proper handling of ingredients is also covered. Cooking - Soups and [employer] - Soups and Desserts Clinical staff conducted group or individual video education with verbal and written material and guidebook.  Patient learns that Pritikin soups and desserts make for easy, nutritious, and delicious snacks and meal components that are low in sodium, fat, sugar, and calorie density, while high in vitamins, minerals, and filling fiber. Recommendations include simple and healthy ideas for soups and desserts.   Overview     The Pritikin Solution Program Overview Clinical staff conducted group or individual video education with verbal and written material and guidebook.  Patient learns that the results of the [place] Program have been documented in more than 100 articles published in peer-reviewed journals, and the benefits include reducing risk factors for (and, in some cases, even reversing) high cholesterol, high blood pressure, type 2 diabetes, obesity, and more! An overview of the three key pillars of the Pritikin Program will be covered: eating well, doing regular exercise, and having a healthy mind-set.  WORKSHOPS  Exercise: Exercise Basics: Building Your Action Plan Clinical staff led group instruction and group discussion with PowerPoint presentation and patient guidebook. To enhance the learning environment the use of posters, models and videos may be added. At the conclusion of this workshop, patients will comprehend the difference between physical activity and exercise, as well as the benefits of incorporating both, into their routine. Patients will understand the FITT (Frequency, Intensity, Time, and Type) principle and how to use it to build an exercise action plan. In addition, safety concerns and other considerations for exercise and cardiac rehab will be addressed by the presenter. The purpose of this lesson is to promote a comprehensive and effective weekly exercise routine in  order to improve patients' overall level of fitness.   Managing Heart Disease: Your Path to a Healthier Heart Clinical staff led group instruction and group discussion with PowerPoint presentation and patient guidebook. To enhance the learning environment the use of posters, models and videos may be added.At the conclusion of this workshop, patients will understand the anatomy and physiology of the heart. Additionally, they will understand how Pritikin's three pillars impact the risk factors, the progression, and the management of heart disease.  The purpose of this lesson is to provide a high-level overview of the heart, heart disease, and how the Pritikin lifestyle positively impacts risk factors.  Exercise Biomechanics Clinical staff led group instruction and group discussion with PowerPoint presentation and patient guidebook. To enhance the learning environment the use of posters, models and videos may be added. Patients will learn how the structural parts of their bodies function and how these functions impact their daily activities, movement, and exercise. Patients will learn how to promote a neutral spine, learn how to manage pain, and identify ways to improve their physical movement in order to promote healthy living. The purpose of this lesson is to expose patients to common physical limitations that impact physical activity. Participants will learn  practical ways to adapt and manage aches and pains, and to minimize their effect on regular exercise. Patients will learn how to maintain good posture while sitting, walking, and lifting.  Balance Training and Fall Prevention  Clinical staff led group instruction and group discussion with PowerPoint presentation and patient guidebook. To enhance the learning environment the use of posters, models and videos may be added. At the conclusion of this workshop, patients will understand the importance of their sensorimotor skills (vision,  proprioception, and the vestibular system) in maintaining their ability to balance as they age. Patients will apply a variety of balancing exercises that are appropriate for their current level of function. Patients will understand the common causes for poor balance, possible solutions to these problems, and ways to modify their physical environment in order to minimize their fall risk. The purpose of this lesson is to teach patients about the importance of maintaining balance as they age and ways to minimize their risk of falling.  WORKSHOPS   Nutrition:  Fueling a Scientist, research (physical sciences) led group instruction and group discussion with PowerPoint presentation and patient guidebook. To enhance the learning environment the use of posters, models and videos may be added. Patients will review the foundational principles of the [place] and understand what constitutes a serving size in each of the food groups. Patients will also learn Pritikin-friendly foods that are better choices when away from home and review make-ahead meal and snack options. Calorie density will be reviewed and applied to three nutrition priorities: weight maintenance, weight loss, and weight gain. The purpose of this lesson is to reinforce (in a group setting) the key concepts around what patients are recommended to eat and how to apply these guidelines when away from home by planning and selecting Pritikin-friendly options. Patients will understand how calorie density may be adjusted for different weight management goals.  Mindful Eating  Clinical staff led group instruction and group discussion with PowerPoint presentation and patient guidebook. To enhance the learning environment the use of posters, models and videos may be added. Patients will briefly review the concepts of the [place] and the importance of low-calorie dense foods. The concept of mindful eating will be introduced as well as the  importance of paying attention to internal hunger signals. Triggers for non-hunger eating and techniques for dealing with triggers will be explored. The purpose of this lesson is to provide patients with the opportunity to review the basic principles of the [place], discuss the value of eating mindfully and how to measure internal cues of hunger and fullness using the Hunger Scale. Patients will also discuss reasons for non-hunger eating and learn strategies to use for controlling emotional eating.  Targeting Your Nutrition Priorities Clinical staff led group instruction and group discussion with PowerPoint presentation and patient guidebook. To enhance the learning environment the use of posters, models and videos may be added. Patients will learn how to determine their genetic susceptibility to disease by reviewing their family history. Patients will gain insight into the importance of diet as part of an overall healthy lifestyle in mitigating the impact of genetics and other environmental insults. The purpose of this lesson is to provide patients with the opportunity to assess their personal nutrition priorities by looking at their family history, their own health history and current risk factors. Patients will also be able to discuss ways of prioritizing and modifying the [place] for their highest risk areas  Menu  Clinical staff  led group instruction and group discussion with PowerPoint presentation and patient guidebook. To enhance the learning environment the use of posters, models and videos may be added. Using menus brought in from [employer], or printed from [employer], patients will apply the [place] dining out guidelines that were presented in the [employer] video. Patients will also be able to practice these guidelines in a variety of provided scenarios. The purpose of this lesson is to provide patients with the opportunity to practice  hands-on learning of the [place] with actual menus and practice scenarios.  Label Reading Clinical staff led group instruction and group discussion with PowerPoint presentation and patient guidebook. To enhance the learning environment the use of posters, models and videos may be added. Patients will review and discuss the Pritikin label reading guidelines presented in Pritikin's Label Reading Educational series video. Using fool labels brought in from local grocery stores and markets, patients will apply the label reading guidelines and determine if the packaged food meet the Pritikin guidelines. The purpose of this lesson is to provide patients with the opportunity to review, discuss, and practice hands-on learning of the Pritikin Label Reading guidelines with actual packaged food labels. [place] Workshops are designed to teach patients ways to prepare quick, simple, and affordable recipes at home. The importance of nutrition's role in chronic disease risk reduction is reflected in its emphasis in the overall Pritikin program. By learning how to prepare essential core Pritikin Eating Plan recipes, patients will increase control over what they eat; be able to customize the flavor of foods without the use of added salt, sugar, or fat; and improve the quality of the food they consume. By learning a set of core recipes which are easily assembled, quickly prepared, and affordable, patients are more likely to prepare more healthy foods at home. These workshops focus on convenient breakfasts, simple entres, side dishes, and desserts which can be prepared with minimal effort and are consistent with nutrition recommendations for cardiovascular risk reduction. Cooking [employer] are taught by a Engineer, materials (RD) who has been trained by the [employer]. The chef or RD has a clear understanding of the importance of minimizing -  if not completely eliminating - added fat, sugar, and sodium in recipes. Throughout the series of [place] Workshop sessions, patients will learn about healthy ingredients and efficient methods of cooking to build confidence in their capability to prepare    Cooking School weekly topics:  Adding Flavor- Sodium-Free  Fast and Healthy Breakfasts  Powerhouse Plant-Based Proteins  Satisfying Salads and Dressings  Simple Sides and Sauces  International Cuisine-Spotlight on the [place] Zones  Delicious Desserts  Savory Soups  Efficiency Cooking - Meals in a Snap  Tasty Appetizers and Snacks  Comforting Weekend Breakfasts  One-Pot Wonders   Fast Evening Meals  Easy [place] (Psychosocial): New Thoughts, New Behaviors Clinical staff led group instruction and group discussion with PowerPoint presentation and patient guidebook. To enhance the learning environment the use of posters, models and videos may be added. Patients will learn and practice techniques for developing effective health and lifestyle goals. Patients will be able to effectively apply the goal setting process learned to develop at least one new personal goal.  The purpose of this lesson is to expose patients to a new skill set of behavior modification techniques such as techniques setting SMART  goals, overcoming barriers, and achieving new thoughts and new behaviors.  Managing Moods and Relationships Clinical staff led group instruction and group discussion with PowerPoint presentation and patient guidebook. To enhance the learning environment the use of posters, models and videos may be added. Patients will learn how emotional and chronic stress factors can impact their health and relationships. They will learn healthy ways to manage their moods and utilize positive coping mechanisms. In addition, ICR patients will learn ways to improve communication skills.  The purpose of this lesson is to expose patients to ways of understanding how one's mood and health are intimately connected. Developing a healthy outlook can help build positive relationships and connections with others. Patients will understand the importance of utilizing effective communication skills that include actively listening and being heard. They will learn and understand the importance of the "4 Cs" and especially Connections in fostering of a Healthy Mind-Set.  Healthy Sleep for a Healthy Heart Clinical staff led group instruction and group discussion with PowerPoint presentation and patient guidebook. To enhance the learning environment the use of posters, models and videos may be added. At the conclusion of this workshop, patients will be able to demonstrate knowledge of the importance of sleep to overall health, well-being, and quality of life. They will understand the symptoms of, and treatments for, common sleep disorders. Patients will also be able to identify daytime and nighttime behaviors which impact sleep, and they will be able to apply these tools to help manage sleep-related challenges. The purpose of this lesson is to provide patients with a general overview of sleep and outline the importance of quality sleep. Patients will learn about a few of the most common sleep disorders. Patients will also be introduced to the concept of "sleep hygiene," and discover ways to self-manage certain sleeping problems through simple daily behavior changes. Finally, the workshop will motivate patients by clarifying the links between quality sleep and their goals of heart-healthy living.   Recognizing and Reducing Stress Clinical staff led group instruction and group discussion with PowerPoint presentation and patient guidebook. To enhance the learning environment the use of posters, models and videos may be added. At the conclusion of this workshop, patients will be able to understand the types of  stress reactions, differentiate between acute and chronic stress, and recognize the impact that chronic stress has on their health. They will also be able to apply different coping mechanisms, such as reframing negative self-talk. Patients will have the opportunity to practice a variety of stress management techniques, such as deep abdominal breathing, progressive muscle relaxation, and/or guided imagery.  The purpose of this lesson is to educate patients on the role of stress in their lives and to provide healthy techniques for coping with it.  Learning Barriers/Preferences:  Learning Barriers/Preferences - 07/17/22 1036       Learning Barriers/Preferences   Learning Barriers Exercise Concerns   Balance concern, dizziness, assistive device, previous falls   Learning Preferences Pictoral;Written Material;Individual Instruction;Group Instruction             Education Topics:  Knowledge Questionnaire Score:  Knowledge Questionnaire Score - 07/17/22 1035       Knowledge Questionnaire Score   Pre Score 20/24             Core Components/Risk Factors/Patient Goals at Admission:  Personal Goals and Risk Factors at Admission - 07/17/22 1037       Core Components/Risk Factors/Patient Goals on Admission    Weight Management Yes;Obesity;Weight Loss  Intervention Weight Management: Develop a combined nutrition and exercise program designed to reach desired caloric intake, while maintaining appropriate intake of nutrient and fiber, sodium and fats, and appropriate energy expenditure required for the weight goal.;Weight Management: Provide education and appropriate resources to help participant work on and attain dietary goals.;Weight Management/Obesity: Establish reasonable short term and long term weight goals.;Obesity: Provide education and appropriate resources to help participant work on and attain dietary goals.    Admit Weight 167 lb 15.9 oz (76.2 kg)    Expected Outcomes Short  Term: Continue to assess and modify interventions until short term weight is achieved;Long Term: Adherence to nutrition and physical activity/exercise program aimed toward attainment of established weight goal;Weight Loss: Understanding of general recommendations for a balanced deficit meal plan, which promotes 1-2 lb weight loss per week and includes a negative energy balance of [phone] kcal/d;Understanding recommendations for meals to include 15-35% energy as protein, 25-35% energy from fat, 35-60% energy from carbohydrates, less than '200mg'$  of dietary cholesterol, 20-35 gm of total fiber daily;Understanding of distribution of calorie intake throughout the day with the consumption of 4-5 meals/snacks    Heart Failure Yes    Intervention Provide a combined exercise and nutrition program that is supplemented with education, support and counseling about heart failure. Directed toward relieving symptoms such as shortness of breath, decreased exercise tolerance, and extremity edema.    Expected Outcomes Long term: Adoption of self-care skills and reduction of barriers for early signs and symptoms recognition and intervention leading to self-care maintenance.;Short term: Daily weights obtained and reported for increase. Utilizing diuretic protocols set by physician.;Short term: Attendance in program 2-3 days a week with increased exercise capacity. Reported lower sodium intake. Reported increased fruit and vegetable intake. Reports medication compliance.;Improve functional capacity of life    Hypertension Yes    Intervention Provide education on lifestyle modifcations including regular physical activity/exercise, weight management, moderate sodium restriction and increased consumption of fresh fruit, vegetables, and low fat dairy, alcohol moderation, and smoking cessation.;Monitor prescription use compliance.    Expected Outcomes Short Term: Continued assessment and intervention until BP is < 140/73m HG in  hypertensive participants. < 130/818mHG in hypertensive participants with diabetes, heart failure or chronic kidney disease.;Long Term: Maintenance of blood pressure at goal levels.    Lipids Yes    Intervention Provide education and support for participant on nutrition & aerobic/resistive exercise along with prescribed medications to achieve LDL '70mg'$ , HDL >'40mg'$ .    Expected Outcomes Short Term: Participant states understanding of desired cholesterol values and is compliant with medications prescribed. Participant is following exercise prescription and nutrition guidelines.;Long Term: Cholesterol controlled with medications as prescribed, with individualized exercise RX and with personalized nutrition plan. Value goals: LDL < '70mg'$ , HDL > 40 mg.    Personal Goal Other Yes    Personal Goal Short term: back to ADL's Long term: strenght and stamina    Intervention Will continue to monitor pt and progress workloads as tolerated without sign or symptom    Expected Outcomes Pt will achieve her goals             Core Components/Risk Factors/Patient Goals Review:    Core Components/Risk Factors/Patient Goals at Discharge (Final Review):    ITP Comments:  ITP Comments     Row Name 07/17/22 0859           ITP Comments Dr TrFransico HimD, Medical Director. Introduction to Pritikin Education Program/ Intensive Cardiac Rehab. Initial Orientation Packet Reviewed with the patient  Comments: Participant attended orientation for the cardiac rehabilitation program on  07/17/2022  to perform initial intake and exercise walk test. Patient introduced to the [place] education and orientation packet was reviewed. Completed 6-minute walk test, measurements, initial ITP, and exercise prescription. Vital signs stable. Telemetry-normal sinus rhythm,intermittent PVC's nonsustained trigeminy, couplet times one. asymptomatic. See previous documentation.Harrell Gave RN BSN    Service time was from [phone] to 1020.

## 2022-07-17 NOTE — Progress Notes (Signed)
Cardiac Rehab Medication Review by a Nurse  Does the patient  feel that his/her medications are working for him/her?  YES   Has the patient been experiencing any side effects to the medications prescribed?  NO  Does the patient measure his/her own blood pressure or blood glucose at home?  NO  Does the patient have any problems obtaining medications due to transportation or finances?    NO  Understanding of regimen: good Understanding of indications: good Potential of compliance: good    Nurse comments: Amy [name] is taking her medications as prescribed and had a good understanding of what his medications are for. Amy [name] has a BP/ cuff monitor at home but does not check her blood pressure on a regular basis.    Christa See [facility] RN 07/17/2022 12:05 PM

## 2022-07-22 ENCOUNTER — Other Ambulatory Visit (HOSPITAL_COMMUNITY): Payer: Self-pay | Admitting: *Deleted

## 2022-07-22 DIAGNOSIS — I5022 Chronic systolic (congestive) heart failure: Secondary | ICD-10-CM

## 2022-07-24 ENCOUNTER — Encounter (HOSPITAL_COMMUNITY): Payer: Self-pay | Admitting: Internal Medicine

## 2022-07-24 ENCOUNTER — Ambulatory Visit (HOSPITAL_COMMUNITY)
Admission: RE | Admit: 2022-07-24 | Discharge: 2022-07-24 | Disposition: A | Discharge status: Home / Self Care | Payer: Medicare HMO | Source: Ambulatory Visit | Attending: Family Medicine | Admitting: Family Medicine

## 2022-07-24 ENCOUNTER — Ambulatory Visit (HOSPITAL_BASED_OUTPATIENT_CLINIC_OR_DEPARTMENT_OTHER)
Admission: RE | Admit: 2022-07-24 | Discharge: 2022-07-24 | Disposition: A | Discharge status: Home / Self Care | Payer: Medicare HMO | Source: Ambulatory Visit | Attending: Internal Medicine | Admitting: Internal Medicine

## 2022-07-24 VITALS — BP 126/84 | HR 80 | Ht <= 58 in | Wt 169.2 lb

## 2022-07-24 DIAGNOSIS — I2510 Atherosclerotic heart disease of native coronary artery without angina pectoris: Secondary | ICD-10-CM | POA: Diagnosis not present

## 2022-07-24 DIAGNOSIS — Z66 Do not resuscitate: Secondary | ICD-10-CM | POA: Insufficient documentation

## 2022-07-24 DIAGNOSIS — I071 Rheumatic tricuspid insufficiency: Secondary | ICD-10-CM

## 2022-07-24 DIAGNOSIS — I5042 Chronic combined systolic (congestive) and diastolic (congestive) heart failure: Secondary | ICD-10-CM

## 2022-07-24 DIAGNOSIS — Z7984 Long term (current) use of oral hypoglycemic drugs: Secondary | ICD-10-CM | POA: Insufficient documentation

## 2022-07-24 DIAGNOSIS — I441 Atrioventricular block, second degree: Secondary | ICD-10-CM | POA: Diagnosis not present

## 2022-07-24 DIAGNOSIS — Z8616 Personal history of COVID-19: Secondary | ICD-10-CM | POA: Insufficient documentation

## 2022-07-24 DIAGNOSIS — I350 Nonrheumatic aortic (valve) stenosis: Secondary | ICD-10-CM | POA: Diagnosis not present

## 2022-07-24 DIAGNOSIS — I5082 Biventricular heart failure: Secondary | ICD-10-CM | POA: Diagnosis not present

## 2022-07-24 DIAGNOSIS — Z79899 Other long term (current) drug therapy: Secondary | ICD-10-CM | POA: Insufficient documentation

## 2022-07-24 DIAGNOSIS — I5022 Chronic systolic (congestive) heart failure: Secondary | ICD-10-CM

## 2022-07-24 DIAGNOSIS — R7303 Prediabetes: Secondary | ICD-10-CM | POA: Insufficient documentation

## 2022-07-24 DIAGNOSIS — I252 Old myocardial infarction: Secondary | ICD-10-CM | POA: Insufficient documentation

## 2022-07-24 LAB — ECHOCARDIOGRAM COMPLETE
AR max vel: 1.13 cm2
AV Area VTI: 1.22 cm2
AV Area mean vel: 1.07 cm2
AV Mean grad: 19 mmHg
AV Peak grad: 32.3 mmHg
Ao pk vel: 2.84 m/s
Area-P 1/2: 5.13 cm2
Calc EF: 47.2 %
S' Lateral: 4.2 cm
Single Plane A2C EF: 50.1 %
Single Plane A4C EF: 42.4 %

## 2022-07-24 LAB — CBC
HCT: 47.7 % — ABNORMAL HIGH (ref 36.0–46.0)
Hemoglobin: 15.8 g/dL — ABNORMAL HIGH (ref 12.0–15.0)
MCH: 30.4 pg (ref 26.0–34.0)
MCHC: 33.1 g/dL (ref 30.0–36.0)
MCV: 91.9 fL (ref 80.0–100.0)
Platelets: 267 10*3/uL (ref 150–400)
RBC: 5.19 MIL/uL — ABNORMAL HIGH (ref 3.87–5.11)
RDW: 15.7 % — ABNORMAL HIGH (ref 11.5–15.5)
WBC: 8 10*3/uL (ref 4.0–10.5)
nRBC: 0 % (ref 0.0–0.2)

## 2022-07-24 LAB — COMPREHENSIVE METABOLIC PANEL
ALT: 38 U/L (ref 0–44)
AST: 26 U/L (ref 15–41)
Albumin: 3.9 g/dL (ref 3.5–5.0)
Alkaline Phosphatase: 87 U/L (ref 38–126)
Anion gap: 6 (ref 5–15)
BUN: 20 mg/dL (ref 8–23)
CO2: 25 mmol/L (ref 22–32)
Calcium: 9.3 mg/dL (ref 8.9–10.3)
Chloride: 105 mmol/L (ref 98–111)
Creatinine, Ser: 0.79 mg/dL (ref 0.44–1.00)
GFR, Estimated: >60 mL/min (ref >60)
Glucose, Bld: 122 mg/dL — ABNORMAL HIGH (ref 70–99)
Potassium: 5 mmol/L (ref 3.5–5.1)
Sodium: 136 mmol/L (ref 135–145)
Total Bilirubin: 0.4 mg/dL (ref 0.3–1.2)
Total Protein: 7.5 g/dL (ref 6.5–8.1)

## 2022-07-24 LAB — DIGOXIN LEVEL: Digoxin Level: 0.7 ng/mL — ABNORMAL LOW (ref 0.8–2.0)

## 2022-07-24 LAB — BRAIN NATRIURETIC PEPTIDE: B Natriuretic Peptide: 613.4 pg/mL — ABNORMAL HIGH (ref 0.0–100.0)

## 2022-07-24 MED ORDER — PERFLUTREN LIPID MICROSPHERE
1.0000 mL | INTRAVENOUS | Status: DC | PRN
  Administered 2022-07-24: 2 mL via INTRAVENOUS

## 2022-07-24 NOTE — Patient Instructions (Signed)
Medication Changes:  none  Lab Work:  Labs done today, we will call you for abnormal results  Testing/Procedures:  Your physician has requested that you have an echocardiogram. Echocardiography is a painless test that uses sound waves to create images of your heart. It provides your doctor with information about the size and shape of your heart and how well your heart's chambers and valves are working. This procedure takes approximately one hour. There are no restrictions for this procedure. IN 9 MONTHS  Referrals:  none  Special Instructions // Education:  Do the following things EVERYDAY: Weigh yourself in the morning before breakfast. Write it down and keep it in a log. Take your medicines as prescribed Eat low salt foods--Limit salt (sodium) to 2000 mg per day.  Stay as active as you can everyday Limit all fluids for the day to less than 2 liters   Follow-Up in: 9 months with an echocardiogram (June 2024), **PLEASE CALL OUR OFFICE IN [place] TO SCHEDULE THIS APPOINTMENT  At the [facility], you and your health needs are our priority. We have a designated team specialized in the treatment of Heart Failure. This Care Team includes your primary Heart Failure Specialized Cardiologist (physician), Advanced Practice Providers (APPs- Physician Assistants and Nurse Practitioners), and Pharmacist who all work together to provide you with the care you need, when you need it.   You may see any of the following providers on your designated Care Team at your next follow up:  Dr. Glori Bickers Dr. Loralie Champagne Dr. Roxana Hires, NP Lyda Jester, [place] [facility] [place], [place] Forestine Na, NP Audry Riles, PharmD   Please be sure to bring in all your medications bottles to every appointment.   Need to Contact us:  If you have any questions or concerns before your next appointment please send [place] a message through [place] or call our  office at [phone].    TO LEAVE A MESSAGE FOR THE NURSE SELECT OPTION 2, PLEASE LEAVE A MESSAGE INCLUDING: YOUR NAME DATE OF BIRTH CALL BACK NUMBER REASON FOR CALL**this is important as we prioritize the call backs  YOU WILL RECEIVE A CALL BACK THE SAME DAY AS LONG AS YOU CALL BEFORE 4:00 PM

## 2022-07-24 NOTE — Addendum Note (Signed)
Encounter addended by: Scarlette Calico, RN on: 07/24/2022 3:24 PM  Actions taken: Order list changed, Diagnosis association updated, Clinical Note Signed, Charge Capture section accepted

## 2022-07-24 NOTE — Progress Notes (Signed)
ADVANCED HF CLINIC NOTE  Primary Care: Shirline Frees, MD HF Cardiologist: Dr. 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    Admitted 4/23 with late presentation inferior STEMI. Not taken for urgent cardiac cath as she completed her MI and had AKI. Course complicated by RV infarct>>cardiogenic shock. 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 EP consulted and felt to be secondary to late presentation MI. Felt conduction would improve and no need for permanent pacemaker. Echo showed EF 30-35%, RV severely dilated with severely reduced function, moderate to severe TR, moderate to severe AS. She was given IV fluids and started on DBA for RV support.  Scr improved and underwent R/LHC showing (on DBA 2.5): 100% RCA, 60% LM, 50% Lcx, 60% p to m LAD, 90% Ramus, RA 19 (with prominent v waves), PA 27/11 (19), PCWP 5, Fick CO 4.1/Index 2.3, PA sat 43%/49%, PAPi 0.8. Palliative consulted and she remained DNR. DBA weaned off and GDMT titrated. Discharged home with HH, weight 180 lbs.  Today she returns for HF follow up. Doing pretty well. Can do ADLs without difficulty. Has not been able to go out to store because daughter doesn't think she is steady enough. Had URI last week but cleared up now. Denies CP. No edema, orthopnea or PND.   Echo today 07/24/22: EF 40-45% Moderate AS. RV moderately dilated/HK. Severe TR  Cardiac Studies: - Echo (4/23): EF 30-35%, RV severely dilated with severely reduced function, inferolateral akinesis, moderate to severe TR, moderate to severe AS.   - R/LHC (4/23): CAD with interval occlusion of ostial RCA with associate large RV infarct, boderline 60% LM lesion, mild AS, hemodynamics c/w with severe RV failure and severe TR    Ost RCA to Prox RCA lesion is 100% stenosed.   Ost LM lesion is 60% stenosed.   Ost Cx lesion is 50%  stenosed.   Prox LAD to Mid LAD lesion is 60% stenosed.   Ramus lesion is 90% stenosed.  On dobutamine 2.5   Ao = 83/53 (67) LV = 92/14 RA = 19 (with prominent v- waves) RV = 28/18 PA = 27/11 (19) PCW = 5 Fick cardiac output/index = 4.1/2.3 PVR = 3.4 WU FA sat = 88% PA sat = 43%, 49% PAPi = 0.8 AoV gradient peak to peak 2.6mHG on pullback    Past Medical History:  Diagnosis Date   CHF (congestive heart failure) (HCC)    Coronary artery disease    GERD (gastroesophageal reflux disease)    Hyperlipidemia    Obesity    Pre-diabetes    Current Outpatient Medications  Medication Sig Dispense Refill   acetaminophen (TYLENOL) 500 MG tablet Take 500 mg by mouth every 6 (six) hours as needed for mild pain or headache.     aspirin 81 MG EC tablet Take 81 mg by mouth every evening.     atorvastatin (LIPITOR) 80 MG tablet Take 1 tablet (80 mg total) by mouth daily. 30 tablet 0   Carboxymethylcellulose Sodium (DRY EYE RELIEF OP) Place 1 drop into both eyes 4 (four) times daily as needed (dryness).     clopidogrel (PLAVIX) 75 MG tablet Take 1 tablet (75 mg total) by mouth daily. 31 tablet 2   Coenzyme Q10 (CO Q-10) 200 MG CAPS Take 200 mg by mouth daily.     Cyanocobalamin (VITAMIN B 12  PO) Take 1 tablet by mouth daily.     digoxin (LANOXIN) 0.125 MG tablet Take 1 tablet (0.125 mg total) by mouth daily. 31 tablet 2   empagliflozin (JARDIANCE) 10 MG TABS tablet Take 1 tablet (10 mg total) by mouth daily. 30 tablet 11   meclizine (ANTIVERT) 25 MG tablet Take 25 mg by mouth daily.     metoprolol succinate (TOPROL XL) 25 MG 24 hr tablet Take 1/2 tab Daily 15 tablet 11   Multiple Vitamin (MULTI-VITAMIN) tablet Take 1 tablet by mouth daily.     Pramoxine-Dimethicone (GOLD BOND INTENSIVE HEALING EX) Apply 1 application topically daily as needed (eczema).     Prasterone, DHEA, (DHEA PO) Take by mouth.     torsemide (DEMADEX) 20 MG tablet Take 10 mg by mouth daily as needed (fluid retention.).      No current facility-administered medications for this encounter.   Facility-Administered Medications Ordered in Other Encounters  Medication Dose Route Frequency Provider Last Rate Last Admin   perflutren lipid microspheres (DEFINITY) IV suspension  1-10 mL Intravenous PRN Jacia Sickman, Shaune Pascal, MD   2 mL at 07/24/22 1434   Allergies  Allergen Reactions   Timolol Shortness Of Breath   Codeine Other (See Comments)    sick   Morphine And Related Nausea And Vomiting   Other Other (See Comments)    vicryl sutures - rash/redness, delay in healing   Social History   Socioeconomic History   Marital status: Married    Spouse name: Not on file   Number of children: Not on file   Years of education: 12   Highest education level: High school graduate  Occupational History   Occupation: retired  Tobacco Use   Smoking status: Never   Smokeless tobacco: Never  Scientific laboratory technician Use: Not on file  Substance and Sexual Activity   Alcohol use: No   Drug use: No   Sexual activity: Not Currently  Other Topics Concern   Not on file  Social History Narrative   Not on file   Social Determinants of Health   Financial Resource Strain: Not on file  Food Insecurity: No Food Insecurity (07/24/2022)   Hunger Vital Sign    Worried About Running Out of Food in the Last Year: Never true    Ran Out of Food in the Last Year: Never true  Transportation Needs: No Transportation Needs (07/24/2022)   PRAPARE - Hydrologist (Medical): No    Lack of Transportation (Non-Medical): No  Physical Activity: Not on file  Stress: Not on file  Social Connections: Not on file  Intimate Partner Violence: Not on file   Family History  Problem Relation Age of Onset   CAD Neg Hx    BP 126/84   Pulse 80   Ht '4\' 9"'$  (1.448 m)   Wt 76.7 kg (169 lb 3.2 oz)   SpO2 94%   BMI 36.61 kg/m   Wt Readings from Last 3 Encounters:  07/24/22 76.7 kg (169 lb 3.2 oz)  07/17/22 76.2 kg (167  lb 15.9 oz)  05/19/22 79.1 kg (174 lb 6.4 oz)   PHYSICAL EXAM: General:  Elderly. No resp difficulty HEENT: normal Neck: supple. JVP 6-7  Carotids 2+ bilat; no bruits. No lymphadenopathy or thryomegaly appreciated. Cor: PMI nondisplaced. Regular rate & rhythm. 3/6 AS 2/6 TR Lungs: clear Abdomen: obese soft, nontender, nondistended. No hepatosplenomegaly. No bruits or masses. Good bowel sounds. Extremities: no cyanosis, clubbing, rash,  edema Neuro: alert & orientedx3, cranial nerves grossly intact. moves all 4 extremities w/o difficulty. Affect pleasant   ASSESSMENT & PLAN:  1. 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  - LHC (4/23): 100% RCA, 60% LM, 50% Lcx, 60% p to m LAD, 90% Ramus. Manage medically. - No s/s angina - Continue DAPT/statin/beta blocker. - Has gone to CR orientation   2. Chronic biventricular heart failure: - EF down to 30-35% in 12/22 in setting of COVID-19 infection.  - LV function subsequently recovered, Echo (3/23): EF up to 50-55%  - Echo (4/23): EF 30-35%, RV severely reduced, moderate to severe TR, moderate to severe aortic valve stenosis with mean gradient 12 mmHg and AVA 0.85 cm2 - RHC (4/23) on 2.5 DBA: severe RV failure and severe TR - RA 19 (prominent v waves), PCWP 5, Fick CO 4.1/Index 2.3, PAPi 2.8 - Echo today 07/24/22: EF 40-45% Moderate AS. RV moderately dilated/HK. Severe TR Personally reviewed - Stable NYHA II-III. Volume looks Ok on exam.  - Continue spiro 12.5 mg daily. - Continue Toprol XL 12.5 mg daily. - Continue Jardiance 10 mg daily. - Continue digoxin 0.125 mg daily. Dig level 0.6 on 03/17/22 - Continue torsemide 10 mg daily PRN. - Off Entresto with hyperkalemia.  - Labs today   3. Tricuspid Regurgitation, severe: - Stable on echo today. - RV failure well controlled.  - If deteriorates can consider TV clip    4. Aortic valve stenosis: - Moderate on echo  today - Repeat echo in 9 months   5. Mobitz II second degree AV block: - EP has seen, no indications for pacemaker. It was felt that this would likely recover with time.   6. Prediabetes: - A1c 6.3%. - Continue SGLT2i.   7. Physical deconditioning: - Improving. CR as above. - Daughter lives with her.   Glori Bickers, MD  3:01 PM

## 2022-07-25 ENCOUNTER — Encounter (HOSPITAL_COMMUNITY)
Admission: RE | Admit: 2022-07-25 | Discharge: 2022-07-25 | Disposition: A | Discharge status: Home / Self Care | Payer: Medicare HMO | Source: Ambulatory Visit | Attending: Internal Medicine | Admitting: Internal Medicine

## 2022-07-25 DIAGNOSIS — Z48812 Encounter for surgical aftercare following surgery on the circulatory system: Secondary | ICD-10-CM | POA: Insufficient documentation

## 2022-07-25 DIAGNOSIS — I213 ST elevation (STEMI) myocardial infarction of unspecified site: Secondary | ICD-10-CM | POA: Diagnosis present

## 2022-07-25 DIAGNOSIS — I252 Old myocardial infarction: Secondary | ICD-10-CM | POA: Diagnosis not present

## 2022-07-25 DIAGNOSIS — I5022 Chronic systolic (congestive) heart failure: Secondary | ICD-10-CM | POA: Diagnosis not present

## 2022-07-25 NOTE — Progress Notes (Signed)
QUALITY OF LIFE SCORE REVIEW Pt completed Quality of Life survey as a participant in Cardiac Rehab.  Scores 19.0 or below are considered low. Overall 26.79, Health and Function 25.62, socioeconomic 28.50, physiological and spiritual 29.14, family 24.38. Patient quality of life slightly altered by physical constraints which limits ability to perform as prior to recent cardiac illness. Offered emotional support and reassurance.  Will continue to monitor and intervene as necessary.   Albertine Grates RN 9/8/20232:08 PM

## 2022-07-25 NOTE — Progress Notes (Signed)
Daily Session Note  Patient Details  Name: Amy [name] MRN: [id number] Date of Birth: July 05, 1946 Referring Provider:   Flowsheet Row INTENSIVE CARDIAC REHAB ORIENT from 07/17/2022 in [place]  Referring Provider Dr. Glori Bickers MD       Encounter Date: 07/25/2022  Check In:  Session Check In - 07/25/22 0909       Check-In   Supervising physician immediately available to respond to emergencies [facility] immediately available    Physician(s) Dr. Ninfa Linden    Location [facility]    Staff Present Barnet Pall, RN, BSN;Jetta Walker BS, ACSM-CEP, Exercise Physiologist;Mckynleigh Mussell Agapito Games, MS, ACSM-CEP, CCRP, Exercise Physiologist;Bailey Pearline Cables, MS, Exercise Physiologist;Olinty Celesta Aver, MS, ACSM-CEP, Exercise Physiologist    Virtual Visit No    Medication changes reported     No    Fall or balance concerns reported    No    Tobacco Cessation No Change    Current number of cigarettes/nicotine per day     0    Warm-up and Cool-down Not performed (comment)   Cardiac Rehab Orientation   Resistance Training Performed Yes    VAD Patient? No    PAD/SET Patient? No      Pain Assessment   Currently in Pain? No/denies    Pain Score 0-No pain    Multiple Pain Sites No             Capillary Blood Glucose: Results for orders placed or performed during the hospital encounter of 07/24/22 (from the past 24 hour(s))  Comprehensive Metabolic Panel (CMET)     Status: Abnormal   Collection Time: 07/24/22  3:21 PM  Result Value Ref Range   Sodium 136 135 - 145 mmol/L   Potassium 5.0 3.5 - 5.1 mmol/L   Chloride 105 98 - 111 mmol/L   CO2 25 22 - 32 mmol/L   Glucose, Bld 122 (H) 70 - 99 mg/dL   BUN 20 8 - 23 mg/dL   Creatinine, Ser 0.79 0.44 - 1.00 mg/dL   Calcium 9.3 8.9 - 10.3 mg/dL   Total Protein 7.5 6.5 - 8.1 g/dL   Albumin 3.9 3.5 - 5.0 g/dL   AST 26 15 - 41 U/L   ALT 38 0 - 44 U/L   Alkaline Phosphatase 87  38 - 126 U/L   Total Bilirubin 0.4 0.3 - 1.2 mg/dL   GFR, Estimated >60 >60 mL/min   Anion gap 6 5 - 15  CBC     Status: Abnormal   Collection Time: 07/24/22  3:21 PM  Result Value Ref Range   WBC 8.0 4.0 - 10.5 K/uL   RBC 5.19 (H) 3.87 - 5.11 MIL/uL   Hemoglobin 15.8 (H) 12.0 - 15.0 g/dL   HCT 47.7 (H) 36.0 - 46.0 %   MCV 91.9 80.0 - 100.0 fL   MCH 30.4 26.0 - 34.0 pg   MCHC 33.1 30.0 - 36.0 g/dL   RDW 15.7 (H) 11.5 - 15.5 %   Platelets 267 150 - 400 K/uL   nRBC 0.0 0.0 - 0.2 %  B Nat Peptide     Status: Abnormal   Collection Time: 07/24/22  3:21 PM  Result Value Ref Range   B Natriuretic Peptide 613.4 (H) 0.0 - 100.0 pg/mL  Digoxin level     Status: Abnormal   Collection Time: 07/24/22  3:21 PM  Result Value Ref Range   Digoxin Level 0.7 (L) 0.8 - 2.0 ng/mL  Exercise Prescription Changes - 07/25/22 1100       Response to Exercise   Blood Pressure (Admit) 110/60    Blood Pressure (Exercise) 108/78    Blood Pressure (Exit) 108/62    Heart Rate (Admit) 76 bpm    Heart Rate (Exercise) 101 bpm    Heart Rate (Exit) 81 bpm    Rating of Perceived Exertion (Exercise) 12    Symptoms None    Comments Pt's first day in the CRP2 program    Duration Continue with 30 min of aerobic exercise without signs/symptoms of physical distress.    Intensity THRR unchanged      Progression   Progression Continue to progress workloads to maintain intensity without signs/symptoms of physical distress.    Average METs 1.3      Resistance Training   Training Prescription Yes    Weight 2 lbs    Reps 10-15    Time 10 Minutes      Interval Training   Interval Training No      NuStep   Level 1    SPM 56    Minutes 22    METs 1.3             Social History   Tobacco Use  Smoking Status Never  Smokeless Tobacco Never    Goals Met:  Exercise tolerated well Personal goals reviewed No report of concerns or symptoms today Strength training completed today  Goals Unmet:   Not Applicable  Comments: Pt started Intensive cardiac rehab today. Pt tolerated light exercise without difficulty. VSS, telemetry-NSR, asymptomatic. Medication list reconciled. Pt denies barriers to medication compliance. PSYCHOSOCIAL ASSESSMENT:  PHQ-0. Pt exhibits positive coping skills, hopeful outlook with supportive family. No psychosocial needs identified at this time, no psychosocial interventions necessary. Pt enjoys yard work and Systems analyst. Pt oriented to exercise equipment and routine. Understanding verbalized.  Albertine Grates RN 9/8/20232:12 PM     Dr. Fransico Him is Medical Director for Cardiac Rehab at [facility].

## 2022-07-28 ENCOUNTER — Encounter (HOSPITAL_COMMUNITY): Payer: Medicare HMO

## 2022-08-01 ENCOUNTER — Encounter (HOSPITAL_COMMUNITY)
Admission: RE | Admit: 2022-08-01 | Discharge: 2022-08-01 | Disposition: A | Discharge status: Home / Self Care | Payer: Medicare HMO | Source: Ambulatory Visit | Attending: Internal Medicine | Admitting: Internal Medicine

## 2022-08-01 DIAGNOSIS — Z48812 Encounter for surgical aftercare following surgery on the circulatory system: Secondary | ICD-10-CM | POA: Diagnosis not present

## 2022-08-01 DIAGNOSIS — I5022 Chronic systolic (congestive) heart failure: Secondary | ICD-10-CM | POA: Diagnosis not present

## 2022-08-01 DIAGNOSIS — I213 ST elevation (STEMI) myocardial infarction of unspecified site: Secondary | ICD-10-CM

## 2022-08-01 DIAGNOSIS — I252 Old myocardial infarction: Secondary | ICD-10-CM | POA: Diagnosis not present

## 2022-08-04 ENCOUNTER — Encounter (HOSPITAL_COMMUNITY)
Admission: RE | Admit: 2022-08-04 | Discharge: 2022-08-04 | Disposition: A | Discharge status: Home / Self Care | Payer: Medicare HMO | Source: Ambulatory Visit | Attending: Internal Medicine | Admitting: Internal Medicine

## 2022-08-04 DIAGNOSIS — Z48812 Encounter for surgical aftercare following surgery on the circulatory system: Secondary | ICD-10-CM | POA: Diagnosis not present

## 2022-08-04 DIAGNOSIS — I213 ST elevation (STEMI) myocardial infarction of unspecified site: Secondary | ICD-10-CM

## 2022-08-04 DIAGNOSIS — I5022 Chronic systolic (congestive) heart failure: Secondary | ICD-10-CM

## 2022-08-04 DIAGNOSIS — I252 Old myocardial infarction: Secondary | ICD-10-CM | POA: Diagnosis not present

## 2022-08-05 NOTE — Progress Notes (Signed)
Cardiac Individual Treatment Plan  Patient Details  Name: Amy [name] MRN: [id number] Date of Birth: 10/14/1946 Referring Provider:   Flowsheet Row INTENSIVE CARDIAC REHAB ORIENT from 07/17/2022 in [facility]  Referring Provider Dr. Glori Bickers MD       Initial Encounter Date:  [place] from 07/17/2022 in [facility]  Date 07/17/22       Visit Diagnosis: ST elevation myocardial infarction (STEMI), unspecified artery (HCC)  Heart failure, chronic systolic (HCC)  Patient's Home Medications on Admission:  Current Outpatient Medications:    acetaminophen (TYLENOL) 500 MG tablet, Take 500 mg by mouth every 6 (six) hours as needed for mild pain or headache., Disp: , Rfl:    aspirin 81 MG EC tablet, Take 81 mg by mouth every evening., Disp: , Rfl:    atorvastatin (LIPITOR) 80 MG tablet, Take 1 tablet (80 mg total) by mouth daily., Disp: 30 tablet, Rfl: 0   Carboxymethylcellulose Sodium (DRY EYE RELIEF OP), Place 1 drop into both eyes 4 (four) times daily as needed (dryness)., Disp: , Rfl:    clopidogrel (PLAVIX) 75 MG tablet, Take 1 tablet (75 mg total) by mouth daily., Disp: 31 tablet, Rfl: 2   Coenzyme Q10 (CO Q-10) 200 MG CAPS, Take 200 mg by mouth daily., Disp: , Rfl:    Cyanocobalamin (VITAMIN B 12 PO), Take 1 tablet by mouth daily., Disp: , Rfl:    digoxin (LANOXIN) 0.125 MG tablet, Take 1 tablet (0.125 mg total) by mouth daily., Disp: 31 tablet, Rfl: 2   empagliflozin (JARDIANCE) 10 MG TABS tablet, Take 1 tablet (10 mg total) by mouth daily., Disp: 30 tablet, Rfl: 11   meclizine (ANTIVERT) 25 MG tablet, Take 25 mg by mouth daily., Disp: , Rfl:    metoprolol succinate (TOPROL XL) 25 MG 24 hr tablet, Take 1/2 tab Daily, Disp: 15 tablet, Rfl: 11   Multiple Vitamin (MULTI-VITAMIN) tablet, Take 1 tablet by mouth daily., Disp: , Rfl:    Pramoxine-Dimethicone (GOLD BOND INTENSIVE  HEALING EX), Apply 1 application topically daily as needed (eczema)., Disp: , Rfl:    Prasterone, DHEA, (DHEA PO), Take by mouth., Disp: , Rfl:    torsemide (DEMADEX) 20 MG tablet, Take 10 mg by mouth daily as needed (fluid retention.)., Disp: , Rfl:   Past Medical History: Past Medical History:  Diagnosis Date   CHF (congestive heart failure) (HCC)    Coronary artery disease    GERD (gastroesophageal reflux disease)    Hyperlipidemia    Obesity    Pre-diabetes     Tobacco Use: Social History   Tobacco Use  Smoking Status Never  Smokeless Tobacco Never    Labs: Review Flowsheet  More data exists      Latest Ref Rng & Units 03/01/2022 03/02/2022 03/03/2022 03/04/2022 03/05/2022  Labs for ITP Cardiac and Pulmonary Rehab  O2 Saturation % 72.5  72.6  58.4  62.3  66.1     Capillary Blood Glucose: Lab Results  Component Value Date   GLUCAP 184 (H) 03/05/2022   GLUCAP 96 03/05/2022   GLUCAP 131 (H) 03/05/2022   GLUCAP 153 (H) 03/04/2022   GLUCAP 120 (H) 03/04/2022     Exercise Target Goals: Exercise Program Goal: Individual exercise prescription set using results from initial 6 min walk test and THRR while considering  patient's activity barriers and safety.   Exercise Prescription Goal: Initial exercise prescription builds to 30-45 minutes a day  of aerobic activity, 2-3 days per week.  Home exercise guidelines will be given to patient during program as part of exercise prescription that the participant will acknowledge.  Activity Barriers & Risk Stratification:  Activity Barriers & Cardiac Risk Stratification - 07/17/22 1024       Activity Barriers & Cardiac Risk Stratification   Activity Barriers Neck/Spine Problems;Joint Problems;Balance Concerns;Deconditioning;Muscular Weakness;History of Falls;Left Knee Replacement;Right Knee Replacement    Cardiac Risk Stratification High             6 Minute Walk:  6 Minute Walk     Row Name 07/17/22 1009         6  Minute Walk   Phase Initial  Nustep test: poor balance, dizziness and assistive device     Distance 886 feet     Walk Time 6 minutes     # of Rest Breaks 0     MPH 1.68     METS 1.2     RPE 9     Perceived Dyspnea  0     VO2 Peak 4.2     Symptoms No     Resting HR 93 bpm     Resting BP 108/78     Resting Oxygen Saturation  95 %     Exercise Oxygen Saturation  during 6 min walk 94 %     Max Ex. HR 102 bpm     Max Ex. BP 114/74     2 Minute Post BP 110/74              Oxygen Initial Assessment:   Oxygen Re-Evaluation:   Oxygen Discharge (Final Oxygen Re-Evaluation):   Initial Exercise Prescription:  Initial Exercise Prescription - 07/17/22 1000       Date of Initial Exercise RX and Referring Provider   Date 07/17/22    Referring Provider Dr. Glori Bickers MD    Expected Discharge Date 09/12/22      NuStep   Level 1    SPM 53    Minutes 20    METs 1.3      Prescription Details   Frequency (times per week) 2    Duration Progress to 30 minutes of continuous aerobic without signs/symptoms of physical distress      Intensity   THRR 40-80% of Max Heartrate 58-116    Ratings of Perceived Exertion 11-13    Perceived Dyspnea 0-4      Progression   Progression Continue progressive overload as per policy without signs/symptoms or physical distress.      Resistance Training   Training Prescription Yes    Weight 2    Reps 10-15             Perform Capillary Blood Glucose checks as needed.  Exercise Prescription Changes:   Exercise Prescription Changes     Row Name 07/25/22 1100 08/04/22 1500           Response to Exercise   Blood Pressure (Admit) 110/60 104/65      Blood Pressure (Exercise) 108/78 110/70      Blood Pressure (Exit) 108/62 110/64      Heart Rate (Admit) 76 bpm 89 bpm      Heart Rate (Exercise) 101 bpm 109 bpm      Heart Rate (Exit) 81 bpm 89 bpm      Rating of Perceived Exertion (Exercise) 12 11      Symptoms None None       Comments Pt's first day  in the [facility] program Reviewed METs      Duration Continue with 30 min of aerobic exercise without signs/symptoms of physical distress. Progress to 30 minutes of  aerobic without signs/symptoms of physical distress      Intensity THRR unchanged THRR unchanged        Progression   Progression Continue to progress workloads to maintain intensity without signs/symptoms of physical distress. Continue to progress workloads to maintain intensity without signs/symptoms of physical distress.      Average METs 1.3 1.5        Resistance Training   Training Prescription Yes Yes      Weight 2 lbs 2 lbs      Reps 10-15 10-15      Time 10 Minutes 10 Minutes        Interval Training   Interval Training No No        NuStep   Level 1 1      SPM 56 --      Minutes 22 25      METs 1.3 1.5               Exercise Comments:   Exercise Comments     Row Name 07/25/22 1107 08/04/22 1547         Exercise Comments Pt's first day in the [facility] program. Pt voiced no complaints. Reviewed METs. Slow progress in thsi deconitioned patient. Pt attentens the CRP2 program 2x/week.               Exercise Goals and Review:   Exercise Goals     Row Name 07/17/22 1030             Exercise Goals   Increase Physical Activity Yes       Intervention Provide advice, education, support and counseling about physical activity/exercise needs.;Develop an individualized exercise prescription for aerobic and resistive training based on initial evaluation findings, risk stratification, comorbidities and participant's personal goals.       Expected Outcomes Short Term: Attend rehab on a regular basis to increase amount of physical activity.;Long Term: Add in home exercise to make exercise part of routine and to increase amount of physical activity.;Long Term: Exercising regularly at least 3-5 days a week.       Increase Strength and Stamina Yes       Intervention Provide advice,  education, support and counseling about physical activity/exercise needs.;Develop an individualized exercise prescription for aerobic and resistive training based on initial evaluation findings, risk stratification, comorbidities and participant's personal goals.       Expected Outcomes Short Term: Increase workloads from initial exercise prescription for resistance, speed, and METs.;Short Term: Perform resistance training exercises routinely during rehab and add in resistance training at home;Long Term: Improve cardiorespiratory fitness, muscular endurance and strength as measured by increased METs and functional capacity (6MWT)       Able to understand and use rate of perceived exertion (RPE) scale Yes       Intervention Provide education and explanation on how to use RPE scale       Expected Outcomes Short Term: Able to use RPE daily in rehab to express subjective intensity level;Long Term:  Able to use RPE to guide intensity level when exercising independently       Knowledge and understanding of Target Heart Rate Range (THRR) Yes       Intervention Provide education and explanation of THRR including how the numbers were predicted and where they are located  for reference       Expected Outcomes Short Term: Able to state/look up THRR;Long Term: Able to use THRR to govern intensity when exercising independently;Short Term: Able to use daily as guideline for intensity in rehab       Understanding of Exercise Prescription Yes       Intervention Provide education, explanation, and written materials on patient's individual exercise prescription       Expected Outcomes Short Term: Able to explain program exercise prescription;Long Term: Able to explain home exercise prescription to exercise independently                Exercise Goals Re-Evaluation :  Exercise Goals Re-Evaluation     Row Name 07/25/22 1105             Exercise Goal Re-Evaluation   Exercise Goals Review Increase Physical  Activity;Increase Strength and Stamina;Able to understand and use rate of perceived exertion (RPE) scale;Knowledge and understanding of Target Heart Rate Range (THRR);Understanding of Exercise Prescription       Comments Pt's first day in the [facility] program. Pt understands the exercise Rx, RPE scale and THRR.       Expected Outcomes Will continue to monitor patient and progress exercise workloads as tolerated.                Discharge Exercise Prescription (Final Exercise Prescription Changes):  Exercise Prescription Changes - 08/04/22 1500       Response to Exercise   Blood Pressure (Admit) 104/65    Blood Pressure (Exercise) 110/70    Blood Pressure (Exit) 110/64    Heart Rate (Admit) 89 bpm    Heart Rate (Exercise) 109 bpm    Heart Rate (Exit) 89 bpm    Rating of Perceived Exertion (Exercise) 11    Symptoms None    Comments Reviewed METs    Duration Progress to 30 minutes of  aerobic without signs/symptoms of physical distress    Intensity THRR unchanged      Progression   Progression Continue to progress workloads to maintain intensity without signs/symptoms of physical distress.    Average METs 1.5      Resistance Training   Training Prescription Yes    Weight 2 lbs    Reps 10-15    Time 10 Minutes      Interval Training   Interval Training No      NuStep   Level 1    Minutes 25    METs 1.5             Nutrition:  Target Goals: Understanding of nutrition guidelines, daily intake of sodium '1500mg'$ , cholesterol '200mg'$ , calories 30% from fat and 7% or less from saturated fats, daily to have 5 or more servings of fruits and vegetables.  Biometrics:  Pre Biometrics - 07/17/22 1031       Pre Biometrics   Height '4\' 9"'$  (1.448 m)    Weight 76.2 kg    Waist Circumference 42.5 inches    Hip Circumference 42.75 inches    Waist to Hip Ratio 0.99 %    BMI (Calculated) 36.34    Triceps Skinfold 38 mm    % Body Fat 50.2 %    Grip Strength 13 kg    Flexibility --    Did not attempt. mobility issues   Single Leg Stand --   Did not attempt, assistive device, previous falls and balance issues             Nutrition  Therapy Plan and Nutrition Goals:  Nutrition Therapy & Goals - 07/25/22 1213       Nutrition Therapy   Diet Heart Healthy Diet    Drug/Food Interactions Statins/Certain Fruits      Personal Nutrition Goals   Nutrition Goal Patient to identify strategies for managing cardiovascular risk by attending the [facility] education and nutrition classes weekly    Personal Goal #2 Patient to reduce sodium intake to '1500mg'$  daily.    Personal Goal #3 Patient to identify food sources and limit daily intake of saturated fat, trans fat, sodium, and refined carbohydrates    Comments Patient reports interest in learning about food and nutrition.      Intervention Plan   Intervention Prescribe, educate and counsel regarding individualized specific dietary modifications aiming towards targeted core components such as weight, hypertension, lipid management, diabetes, heart failure and other comorbidities.;Nutrition handout(s) given to patient.    Expected Outcomes Short Term Goal: Understand basic principles of dietary content, such as calories, fat, sodium, cholesterol and nutrients.;Long Term Goal: Adherence to prescribed nutrition plan.             Nutrition Assessments:  MEDIFICTS Score Key: ?70 Need to make dietary changes  40-70 Heart Healthy Diet ? 40 Therapeutic Level Cholesterol Diet    Picture Your Plate Scores: <35 Unhealthy dietary pattern with much room for improvement. 41-50 Dietary pattern unlikely to meet recommendations for good health and room for improvement. 51-60 More healthful dietary pattern, with some room for improvement.  >60 Healthy dietary pattern, although there may be some specific behaviors that could be improved.    Nutrition Goals Re-Evaluation:  Nutrition Goals Re-Evaluation     [place] Name 07/25/22 1213              Goals   Current Weight 169 lb 15.6 oz (77.1 kg)       Comment A1c 6.3, lipids improved- HDL 26.       Expected Outcome Patient reports interest in learning more about food and nutrition.                Nutrition Goals Re-Evaluation:  Nutrition Goals Re-Evaluation     [place] Name 07/25/22 1213             Goals   Current Weight 169 lb 15.6 oz (77.1 kg)       Comment A1c 6.3, lipids improved- HDL 26.       Expected Outcome Patient reports interest in learning more about food and nutrition.                Nutrition Goals Discharge (Final Nutrition Goals Re-Evaluation):  Nutrition Goals Re-Evaluation - 07/25/22 1213       Goals   Current Weight 169 lb 15.6 oz (77.1 kg)    Comment A1c 6.3, lipids improved- HDL 26.    Expected Outcome Patient reports interest in learning more about food and nutrition.             Psychosocial: Target Goals: Acknowledge presence or absence of significant depression and/or stress, maximize coping skills, provide positive support system. Participant is able to verbalize types and ability to use techniques and skills needed for reducing stress and depression.  Initial Review & Psychosocial Screening:  Initial Psych Review & Screening - 07/17/22 1226       Initial Review   Current issues with None Identified      Family Dynamics   Good Support System? Yes   Yaminah lives with  her daughter     Barriers   Psychosocial barriers to participate in program There are no identifiable barriers or psychosocial needs.      Screening Interventions   Interventions Encouraged to exercise             Quality of Life Scores:  Quality of Life - 07/17/22 1035       Quality of Life   Select Quality of Life      Quality of Life Scores   Health/Function Pre 25.62 %    Socioeconomic Pre 28.5 %    Psych/Spiritual Pre 29.14 %    Family Pre 24.38 %    GLOBAL Pre 26.79 %            Scores of 19 and below usually  indicate a poorer quality of life in these areas.  A difference of  2-3 points is a clinically meaningful difference.  A difference of 2-3 points in the total score of the Quality of Life Index has been associated with significant improvement in overall quality of life, self-image, physical symptoms, and general health in studies assessing change in quality of life.  PHQ-9: Review Flowsheet       07/17/2022  Depression screen PHQ 2/9  Decreased Interest 0  Down, Depressed, Hopeless 0  PHQ - 2 Score 0   Interpretation of Total Score  Total Score Depression Severity:  1-4 = Minimal depression, 5-9 = Mild depression, 10-14 = Moderate depression, 15-19 = Moderately severe depression, 20-27 = Severe depression   Psychosocial Evaluation and Intervention:  Psychosocial Evaluation - 07/25/22 1402       Psychosocial Evaluation & Interventions   Interventions Encouraged to exercise with the program and follow exercise prescription    Comments There are no stressors currently identified; pt encouraged to exercise per EP prescription.    Expected Outcomes Patient will increase her excercise stamina and ability.    Continue Psychosocial Services  No Follow up required             Psychosocial Re-Evaluation:  Psychosocial Re-Evaluation     [place] Name 08/05/22 1224             Psychosocial Re-Evaluation   Current issues with None Identified       Interventions Encouraged to attend Cardiac Rehabilitation for the exercise       Continue Psychosocial Services  No Follow up required                Psychosocial Discharge (Final Psychosocial Re-Evaluation):  Psychosocial Re-Evaluation - 08/05/22 1224       Psychosocial Re-Evaluation   Current issues with None Identified    Interventions Encouraged to attend Cardiac Rehabilitation for the exercise    Continue Psychosocial Services  No Follow up required             Vocational Rehabilitation: Provide vocational rehab  assistance to qualifying candidates.   Vocational Rehab Evaluation & Intervention:  Vocational Rehab - 07/17/22 1227       Initial Vocational Rehab Evaluation & Intervention   Assessment shows need for Vocational Rehabilitation No   Merridith is retired and does not need vocational rehab at this time            Education: Education Goals: Education classes will be provided on a weekly basis, covering required topics. Participant will state understanding/return demonstration of topics presented.    Education     Row Name 07/25/22 0900     Education   Cardiac  Education Topics Pritikin   Lexicographer Nutrition   Nutrition Other  Label Reading   Instruction Review Code 1- Verbalizes Understanding   Class Start Time [phone]   Class Stop Time 0856   Class Time Calculation (min) 44 min    [place] Name 08/01/22 0900     Education   Cardiac Education Topics [facility]ritikin   Select Workshops     Workshops   Educator Exercise Physiologist   Select Psychosocial   Psychosocial Workshop Managing Moods and Relationships   Instruction Review Code 1- Verbalizes Understanding   Class Start Time 0810   Class Stop Time 0851   Class Time Calculation (min) 41 min    [place] Name 08/04/22 0900     Education   Cardiac Education Topics [place]   Select Workshops     Workshops   Educator Exercise Physiologist   Select Exercise   Exercise Workshop Exercise Basics: Building Your Action Plan   Instruction Review Code 1- Verbalizes Understanding   Class Start Time [phone]   Class Stop Time 0855   Class Time Calculation (min) 43 min            Core Videos: Exercise    Move It!  Clinical staff conducted group or individual video education with verbal and written material and guidebook.  Patient learns the recommended Pritikin exercise program. Exercise with the goal of living a long, healthy life. Some of the health benefits of exercise include  controlled diabetes, healthier blood pressure levels, improved cholesterol levels, improved heart and lung capacity, improved sleep, and better body composition. Everyone should speak with their doctor before starting or changing an exercise routine.  Biomechanical Limitations Clinical staff conducted group or individual video education with verbal and written material and guidebook.  Patient learns how biomechanical limitations can impact exercise and how we can mitigate and possibly overcome limitations to have an impactful and balanced exercise routine.  Body Composition Clinical staff conducted group or individual video education with verbal and written material and guidebook.  Patient learns that body composition (ratio of muscle mass to fat mass) is a key component to assessing overall fitness, rather than body weight alone. Increased fat mass, especially visceral belly fat, can put [place] at increased risk for metabolic syndrome, type 2 diabetes, heart disease, and even death. It is recommended to combine diet and exercise (cardiovascular and resistance training) to improve your body composition. Seek guidance from your physician and exercise physiologist before implementing an exercise routine.  Exercise Action Plan Clinical staff conducted group or individual video education with verbal and written material and guidebook.  Patient learns the recommended strategies to achieve and enjoy long-term exercise adherence, including variety, self-motivation, self-efficacy, and positive decision making. Benefits of exercise include fitness, good health, weight management, more energy, better sleep, less stress, and overall well-being.  Medical   Heart Disease Risk Reduction Clinical staff conducted group or individual video education with verbal and written material and guidebook.  Patient learns our heart is our most vital organ as it circulates oxygen, nutrients, white blood cells, and hormones  throughout the entire body, and carries waste away. Data supports a plant-based eating plan like the Pritikin Program for its effectiveness in slowing progression of and reversing heart disease. The video provides a number of recommendations to address heart disease.   Metabolic Syndrome and Belly Fat  Clinical staff conducted group or individual video education with verbal  and written material and guidebook.  Patient learns what metabolic syndrome is, how it leads to heart disease, and how one can reverse it and keep it from coming back. You have metabolic syndrome if you have 3 of the following 5 criteria: abdominal obesity, high blood pressure, high triglycerides, low HDL cholesterol, and high blood sugar.  Hypertension and Heart Disease Clinical staff conducted group or individual video education with verbal and written material and guidebook.  Patient learns that high blood pressure, or hypertension, is very common in the [place]. Hypertension is largely due to excessive salt intake, but other important risk factors include being overweight, physical inactivity, drinking too much alcohol, smoking, and not eating enough potassium from fruits and vegetables. High blood pressure is a leading risk factor for heart attack, stroke, congestive heart failure, dementia, kidney failure, and premature death. Long-term effects of excessive salt intake include stiffening of the arteries and thickening of heart muscle and organ damage. Recommendations include ways to reduce hypertension and the risk of heart disease.  Diseases of Our Time - Focusing on Diabetes Clinical staff conducted group or individual video education with verbal and written material and guidebook.  Patient learns why the best way to stop diseases of our time is prevention, through food and other lifestyle changes. Medicine (such as prescription pills and surgeries) is often only a Band-Aid on the problem, not a long-term solution. Most  common diseases of our time include obesity, type 2 diabetes, hypertension, heart disease, and cancer. The Pritikin Program is recommended and has been proven to help reduce, reverse, and/or prevent the damaging effects of metabolic syndrome.  Nutrition   Overview of the Pritikin Eating Plan  Clinical staff conducted group or individual video education with verbal and written material and guidebook.  Patient learns about the [place] for disease risk reduction. The [place] emphasizes a wide variety of unrefined, minimally-processed carbohydrates, like fruits, vegetables, whole grains, and legumes. Go, Caution, and Stop food choices are explained. Plant-based and lean animal proteins are emphasized. Rationale provided for low sodium intake for blood pressure control, low added sugars for blood sugar stabilization, and low added fats and oils for coronary artery disease risk reduction and weight management.  Calorie Density  Clinical staff conducted group or individual video education with verbal and written material and guidebook.  Patient learns about calorie density and how it impacts the Pritikin Eating Plan. Knowing the characteristics of the food you choose will help you decide whether those foods will lead to weight gain or weight loss, and whether you want to consume more or less of them. Weight loss is usually a side effect of the Pritikin Eating Plan because of its focus on low calorie-dense foods.  Label Reading  Clinical staff conducted group or individual video education with verbal and written material and guidebook.  Patient learns about the Pritikin recommended label reading guidelines and corresponding recommendations regarding calorie density, added sugars, sodium content, and whole grains.  Dining Out - Part 1  Clinical staff conducted group or individual video education with verbal and written material and guidebook.  Patient learns that restaurant meals  can be sabotaging because they can be so high in calories, fat, sodium, and/or sugar. Patient learns recommended strategies on how to positively address this and avoid unhealthy pitfalls.  Facts on Fats  Clinical staff conducted group or individual video education with verbal and written material and guidebook.  Patient learns that lifestyle modifications can be just as  effective, if not more so, as many medications for lowering your risk of heart disease. A Pritikin lifestyle can help to reduce your risk of inflammation and atherosclerosis (cholesterol build-up, or plaque, in the artery walls). Lifestyle interventions such as dietary choices and physical activity address the cause of atherosclerosis. A review of the types of fats and their impact on blood cholesterol levels, along with dietary recommendations to reduce fat intake is also included.  Nutrition Action Plan  Clinical staff conducted group or individual video education with verbal and written material and guidebook.  Patient learns how to incorporate Pritikin recommendations into their lifestyle. Recommendations include planning and keeping personal health goals in mind as an important part of their success.  Healthy Mind-Set    Healthy Minds, Bodies, Hearts  Clinical staff conducted group or individual video education with verbal and written material and guidebook.  Patient learns how to identify when they are stressed. Video will discuss the impact of that stress, as well as the many benefits of stress management. Patient will also be introduced to stress management techniques. The way we think, act, and feel has an impact on our hearts.  How Our Thoughts Can Heal Our Hearts  Clinical staff conducted group or individual video education with verbal and written material and guidebook.  Patient learns that negative thoughts can cause depression and anxiety. This can result in negative lifestyle behavior and serious health problems.  Cognitive behavioral therapy is an effective method to help control our thoughts in order to change and improve our emotional outlook.  Additional Videos:  Exercise    Improving Performance  Clinical staff conducted group or individual video education with verbal and written material and guidebook.  Patient learns to use a non-linear approach by alternating intensity levels and lengths of time spent exercising to help burn more calories and lose more body fat. Cardiovascular exercise helps improve heart health, metabolism, hormonal balance, blood sugar control, and recovery from fatigue. Resistance training improves strength, endurance, balance, coordination, reaction time, metabolism, and muscle mass. Flexibility exercise improves circulation, posture, and balance. Seek guidance from your physician and exercise physiologist before implementing an exercise routine and learn your capabilities and proper form for all exercise.  Introduction to Yoga  Clinical staff conducted group or individual video education with verbal and written material and guidebook.  Patient learns about yoga, a discipline of the coming together of mind, breath, and body. The benefits of yoga include improved flexibility, improved range of motion, better posture and core strength, increased lung function, weight loss, and positive self-image. Yoga's heart health benefits include lowered blood pressure, healthier heart rate, decreased cholesterol and triglyceride levels, improved immune function, and reduced stress. Seek guidance from your physician and exercise physiologist before implementing an exercise routine and learn your capabilities and proper form for all exercise.  Medical   Aging: Enhancing Your Quality of Life  Clinical staff conducted group or individual video education with verbal and written material and guidebook.  Patient learns key strategies and recommendations to stay in good physical health and enhance  quality of life, such as prevention strategies, having an advocate, securing a [place], and keeping a list of medications and system for tracking them. It also discusses how to avoid risk for bone loss.  Biology of Weight Control  Clinical staff conducted group or individual video education with verbal and written material and guidebook.  Patient learns that weight gain occurs because we consume more calories than  we burn (eating more, moving less). Even if your body weight is normal, you may have higher ratios of fat compared to muscle mass. Too much body fat puts you at increased risk for cardiovascular disease, heart attack, stroke, type 2 diabetes, and obesity-related cancers. In addition to exercise, following the [place] can help reduce your risk.  Decoding Lab Results  Clinical staff conducted group or individual video education with verbal and written material and guidebook.  Patient learns that lab test reflects one measurement whose values change over time and are influenced by many factors, including medication, stress, sleep, exercise, food, hydration, pre-existing medical conditions, and more. It is recommended to use the knowledge from this video to become more involved with your lab results and evaluate your numbers to speak with your doctor.   Diseases of Our Time - Overview  Clinical staff conducted group or individual video education with verbal and written material and guidebook.  Patient learns that according to the CDC, 50% to 70% of chronic diseases (such as obesity, type 2 diabetes, elevated lipids, hypertension, and heart disease) are avoidable through lifestyle improvements including healthier food choices, listening to satiety cues, and increased physical activity.  Sleep Disorders Clinical staff conducted group or individual video education with verbal and written material and guidebook.  Patient learns how good quality and  duration of sleep are important to overall health and well-being. Patient also learns about sleep disorders and how they impact health along with recommendations to address them, including discussing with a physician.  Nutrition  Dining Out - Part 2 Clinical staff conducted group or individual video education with verbal and written material and guidebook.  Patient learns how to plan ahead and communicate in order to maximize their dining experience in a healthy and nutritious manner. Included are recommended food choices based on the type of restaurant the patient is visiting.   Fueling a Best boy conducted group or individual video education with verbal and written material and guidebook.  There is a strong connection between our food choices and our health. Diseases like obesity and type 2 diabetes are very prevalent and are in large-part due to lifestyle choices. The Pritikin Eating Plan provides plenty of food and hunger-curbing satisfaction. It is easy to follow, affordable, and helps reduce health risks.  Menu Workshop  Clinical staff conducted group or individual video education with verbal and written material and guidebook.  Patient learns that restaurant meals can sabotage health goals because they are often packed with calories, fat, sodium, and sugar. Recommendations include strategies to plan ahead and to communicate with the manager, chef, or server to help order a healthier meal.  Planning Your Eating Strategy  Clinical staff conducted group or individual video education with verbal and written material and guidebook.  Patient learns about the [place] and its benefit of reducing the risk of disease. The [place] does not focus on calories. Instead, it emphasizes high-quality, nutrient-rich foods. By knowing the characteristics of the foods, we choose, we can determine their calorie density and make informed decisions.  Targeting Your  Nutrition Priorities  Clinical staff conducted group or individual video education with verbal and written material and guidebook.  Patient learns that lifestyle habits have a tremendous impact on disease risk and progression. This video provides eating and physical activity recommendations based on your personal health goals, such as reducing LDL cholesterol, losing weight, preventing or controlling type 2 diabetes, and reducing high blood pressure.  Vitamins and Minerals  Clinical staff conducted group or individual video education with verbal and written material and guidebook.  Patient learns different ways to obtain key vitamins and minerals, including through a recommended healthy diet. It is important to discuss all supplements you take with your doctor.   Healthy Mind-Set    Smoking Cessation  Clinical staff conducted group or individual video education with verbal and written material and guidebook.  Patient learns that cigarette smoking and tobacco addiction pose a serious health risk which affects millions of people. Stopping smoking will significantly reduce the risk of heart disease, lung disease, and many forms of cancer. Recommended strategies for quitting are covered, including working with your doctor to develop a successful plan.  Culinary   Becoming a Financial trader conducted group or individual video education with verbal and written material and guidebook.  Patient learns that cooking at home can be healthy, cost-effective, quick, and puts them in control. Keys to cooking healthy recipes will include looking at your recipe, assessing your equipment needs, planning ahead, making it simple, choosing cost-effective seasonal ingredients, and limiting the use of added fats, salts, and sugars.  Cooking - Breakfast and Snacks  Clinical staff conducted group or individual video education with verbal and written material and guidebook.  Patient learns how important  breakfast is to satiety and nutrition through the entire day. Recommendations include key foods to eat during breakfast to help stabilize blood sugar levels and to prevent overeating at meals later in the day. Planning ahead is also a key component.  Cooking - Human resources officer conducted group or individual video education with verbal and written material and guidebook.  Patient learns eating strategies to improve overall health, including an approach to cook more at home. Recommendations include thinking of animal protein as a side on your plate rather than center stage and focusing instead on lower calorie dense options like vegetables, fruits, whole grains, and plant-based proteins, such as beans. Making sauces in large quantities to freeze for later and leaving the skin on your vegetables are also recommended to maximize your experience.  Cooking - Healthy Salads and Dressing Clinical staff conducted group or individual video education with verbal and written material and guidebook.  Patient learns that vegetables, fruits, whole grains, and legumes are the foundations of the [place]. Recommendations include how to incorporate each of these in flavorful and healthy salads, and how to create homemade salad dressings. Proper handling of ingredients is also covered. Cooking - Soups and [employer] - Soups and Desserts Clinical staff conducted group or individual video education with verbal and written material and guidebook.  Patient learns that Pritikin soups and desserts make for easy, nutritious, and delicious snacks and meal components that are low in sodium, fat, sugar, and calorie density, while high in vitamins, minerals, and filling fiber. Recommendations include simple and healthy ideas for soups and desserts.   Overview     The Pritikin Solution Program Overview Clinical staff conducted group or individual video education with verbal and written material  and guidebook.  Patient learns that the results of the [place] Program have been documented in more than 100 articles published in peer-reviewed journals, and the benefits include reducing risk factors for (and, in some cases, even reversing) high cholesterol, high blood pressure, type 2 diabetes, obesity, and more! An overview of the three key pillars of the Pritikin Program will be covered: eating well, doing regular exercise, and  having a healthy mind-set.  WORKSHOPS  Exercise: Exercise Basics: Building Your Action Plan Clinical staff led group instruction and group discussion with PowerPoint presentation and patient guidebook. To enhance the learning environment the use of posters, models and videos may be added. At the conclusion of this workshop, patients will comprehend the difference between physical activity and exercise, as well as the benefits of incorporating both, into their routine. Patients will understand the FITT (Frequency, Intensity, Time, and Type) principle and how to use it to build an exercise action plan. In addition, safety concerns and other considerations for exercise and cardiac rehab will be addressed by the presenter. The purpose of this lesson is to promote a comprehensive and effective weekly exercise routine in order to improve patients' overall level of fitness.   Managing Heart Disease: Your Path to a Healthier Heart Clinical staff led group instruction and group discussion with PowerPoint presentation and patient guidebook. To enhance the learning environment the use of posters, models and videos may be added.At the conclusion of this workshop, patients will understand the anatomy and physiology of the heart. Additionally, they will understand how Pritikin's three pillars impact the risk factors, the progression, and the management of heart disease.  The purpose of this lesson is to provide a high-level overview of the heart, heart disease, and how the Pritikin  lifestyle positively impacts risk factors.  Exercise Biomechanics Clinical staff led group instruction and group discussion with PowerPoint presentation and patient guidebook. To enhance the learning environment the use of posters, models and videos may be added. Patients will learn how the structural parts of their bodies function and how these functions impact their daily activities, movement, and exercise. Patients will learn how to promote a neutral spine, learn how to manage pain, and identify ways to improve their physical movement in order to promote healthy living. The purpose of this lesson is to expose patients to common physical limitations that impact physical activity. Participants will learn practical ways to adapt and manage aches and pains, and to minimize their effect on regular exercise. Patients will learn how to maintain good posture while sitting, walking, and lifting.  Balance Training and Fall Prevention  Clinical staff led group instruction and group discussion with PowerPoint presentation and patient guidebook. To enhance the learning environment the use of posters, models and videos may be added. At the conclusion of this workshop, patients will understand the importance of their sensorimotor skills (vision, proprioception, and the vestibular system) in maintaining their ability to balance as they age. Patients will apply a variety of balancing exercises that are appropriate for their current level of function. Patients will understand the common causes for poor balance, possible solutions to these problems, and ways to modify their physical environment in order to minimize their fall risk. The purpose of this lesson is to teach patients about the importance of maintaining balance as they age and ways to minimize their risk of falling.  WORKSHOPS   Nutrition:  Fueling a Scientist, research (physical sciences) led group instruction and group discussion with PowerPoint presentation  and patient guidebook. To enhance the learning environment the use of posters, models and videos may be added. Patients will review the foundational principles of the [place] and understand what constitutes a serving size in each of the food groups. Patients will also learn Pritikin-friendly foods that are better choices when away from home and review make-ahead meal and snack options. Calorie density will be reviewed and applied to three nutrition  priorities: weight maintenance, weight loss, and weight gain. The purpose of this lesson is to reinforce (in a group setting) the key concepts around what patients are recommended to eat and how to apply these guidelines when away from home by planning and selecting Pritikin-friendly options. Patients will understand how calorie density may be adjusted for different weight management goals.  Mindful Eating  Clinical staff led group instruction and group discussion with PowerPoint presentation and patient guidebook. To enhance the learning environment the use of posters, models and videos may be added. Patients will briefly review the concepts of the [place] and the importance of low-calorie dense foods. The concept of mindful eating will be introduced as well as the importance of paying attention to internal hunger signals. Triggers for non-hunger eating and techniques for dealing with triggers will be explored. The purpose of this lesson is to provide patients with the opportunity to review the basic principles of the [place], discuss the value of eating mindfully and how to measure internal cues of hunger and fullness using the Hunger Scale. Patients will also discuss reasons for non-hunger eating and learn strategies to use for controlling emotional eating.  Targeting Your Nutrition Priorities Clinical staff led group instruction and group discussion with PowerPoint presentation and patient guidebook. To enhance the  learning environment the use of posters, models and videos may be added. Patients will learn how to determine their genetic susceptibility to disease by reviewing their family history. Patients will gain insight into the importance of diet as part of an overall healthy lifestyle in mitigating the impact of genetics and other environmental insults. The purpose of this lesson is to provide patients with the opportunity to assess their personal nutrition priorities by looking at their family history, their own health history and current risk factors. Patients will also be able to discuss ways of prioritizing and modifying the [place] for their highest risk areas  Menu  Clinical staff led group instruction and group discussion with PowerPoint presentation and patient guidebook. To enhance the learning environment the use of posters, models and videos may be added. Using menus brought in from [employer], or printed from [employer], patients will apply the [place] dining out guidelines that were presented in the [employer] video. Patients will also be able to practice these guidelines in a variety of provided scenarios. The purpose of this lesson is to provide patients with the opportunity to practice hands-on learning of the [place] with actual menus and practice scenarios.  Label Reading Clinical staff led group instruction and group discussion with PowerPoint presentation and patient guidebook. To enhance the learning environment the use of posters, models and videos may be added. Patients will review and discuss the Pritikin label reading guidelines presented in Pritikin's Label Reading Educational series video. Using fool labels brought in from local grocery stores and markets, patients will apply the label reading guidelines and determine if the packaged food meet the Pritikin guidelines. The purpose of this lesson is to provide patients with  the opportunity to review, discuss, and practice hands-on learning of the Pritikin Label Reading guidelines with actual packaged food labels. [employer] [place] Workshops are designed to teach patients ways to prepare quick, simple, and affordable recipes at home. The importance of nutrition's role in chronic disease risk reduction is reflected in its emphasis in the overall Pritikin program. By learning how to prepare essential core Pritikin Eating Plan recipes,  patients will increase control over what they eat; be able to customize the flavor of foods without the use of added salt, sugar, or fat; and improve the quality of the food they consume. By learning a set of core recipes which are easily assembled, quickly prepared, and affordable, patients are more likely to prepare more healthy foods at home. These workshops focus on convenient breakfasts, simple entres, side dishes, and desserts which can be prepared with minimal effort and are consistent with nutrition recommendations for cardiovascular risk reduction. Cooking [employer] are taught by a Engineer, materials (RD) who has been trained by the [employer]. The chef or RD has a clear understanding of the importance of minimizing - if not completely eliminating - added fat, sugar, and sodium in recipes. Throughout the series of [place] Workshop sessions, patients will learn about healthy ingredients and efficient methods of cooking to build confidence in their capability to prepare    Cooking School weekly topics:  Adding Flavor- Sodium-Free  Fast and Healthy Breakfasts  Powerhouse Plant-Based Proteins  Satisfying Salads and Dressings  Simple Sides and Sauces  International Cuisine-Spotlight on the [place] Zones  Delicious Desserts  Savory Soups  Efficiency Cooking - Meals in a Snap  Tasty Appetizers and Snacks  Comforting Weekend Breakfasts  One-Pot Wonders   Fast Evening  Meals  Easy [place] (Psychosocial): New Thoughts, New Behaviors Clinical staff led group instruction and group discussion with PowerPoint presentation and patient guidebook. To enhance the learning environment the use of posters, models and videos may be added. Patients will learn and practice techniques for developing effective health and lifestyle goals. Patients will be able to effectively apply the goal setting process learned to develop at least one new personal goal.  The purpose of this lesson is to expose patients to a new skill set of behavior modification techniques such as techniques setting SMART goals, overcoming barriers, and achieving new thoughts and new behaviors.  Managing Moods and Relationships Clinical staff led group instruction and group discussion with PowerPoint presentation and patient guidebook. To enhance the learning environment the use of posters, models and videos may be added. Patients will learn how emotional and chronic stress factors can impact their health and relationships. They will learn healthy ways to manage their moods and utilize positive coping mechanisms. In addition, ICR patients will learn ways to improve communication skills. The purpose of this lesson is to expose patients to ways of understanding how one's mood and health are intimately connected. Developing a healthy outlook can help build positive relationships and connections with others. Patients will understand the importance of utilizing effective communication skills that include actively listening and being heard. They will learn and understand the importance of the "4 Cs" and especially Connections in fostering of a Healthy Mind-Set.  Healthy Sleep for a Healthy Heart Clinical staff led group instruction and group discussion with PowerPoint presentation and patient guidebook. To enhance the learning environment the use of  posters, models and videos may be added. At the conclusion of this workshop, patients will be able to demonstrate knowledge of the importance of sleep to overall health, well-being, and quality of life. They will understand the symptoms of, and treatments for, common sleep disorders. Patients will also be able to identify daytime and nighttime behaviors which impact sleep, and they will be able to apply these tools to help manage sleep-related challenges. The purpose of this  lesson is to provide patients with a general overview of sleep and outline the importance of quality sleep. Patients will learn about a few of the most common sleep disorders. Patients will also be introduced to the concept of "sleep hygiene," and discover ways to self-manage certain sleeping problems through simple daily behavior changes. Finally, the workshop will motivate patients by clarifying the links between quality sleep and their goals of heart-healthy living.   Recognizing and Reducing Stress Clinical staff led group instruction and group discussion with PowerPoint presentation and patient guidebook. To enhance the learning environment the use of posters, models and videos may be added. At the conclusion of this workshop, patients will be able to understand the types of stress reactions, differentiate between acute and chronic stress, and recognize the impact that chronic stress has on their health. They will also be able to apply different coping mechanisms, such as reframing negative self-talk. Patients will have the opportunity to practice a variety of stress management techniques, such as deep abdominal breathing, progressive muscle relaxation, and/or guided imagery.  The purpose of this lesson is to educate patients on the role of stress in their lives and to provide healthy techniques for coping with it.  Learning Barriers/Preferences:  Learning Barriers/Preferences - 07/17/22 1036       Learning Barriers/Preferences    Learning Barriers Exercise Concerns   Balance concern, dizziness, assistive device, previous falls   Learning Preferences Pictoral;Written Material;Individual Instruction;Group Instruction             Education Topics:  Knowledge Questionnaire Score:  Knowledge Questionnaire Score - 07/17/22 1035       Knowledge Questionnaire Score   Pre Score 20/24             Core Components/Risk Factors/Patient Goals at Admission:  Personal Goals and Risk Factors at Admission - 07/17/22 1037       Core Components/Risk Factors/Patient Goals on Admission    Weight Management Yes;Obesity;Weight Loss    Intervention Weight Management: Develop a combined nutrition and exercise program designed to reach desired caloric intake, while maintaining appropriate intake of nutrient and fiber, sodium and fats, and appropriate energy expenditure required for the weight goal.;Weight Management: Provide education and appropriate resources to help participant work on and attain dietary goals.;Weight Management/Obesity: Establish reasonable short term and long term weight goals.;Obesity: Provide education and appropriate resources to help participant work on and attain dietary goals.    Admit Weight 167 lb 15.9 oz (76.2 kg)    Expected Outcomes Short Term: Continue to assess and modify interventions until short term weight is achieved;Long Term: Adherence to nutrition and physical activity/exercise program aimed toward attainment of established weight goal;Weight Loss: Understanding of general recommendations for a balanced deficit meal plan, which promotes 1-2 lb weight loss per week and includes a negative energy balance of [phone] kcal/d;Understanding recommendations for meals to include 15-35% energy as protein, 25-35% energy from fat, 35-60% energy from carbohydrates, less than '200mg'$  of dietary cholesterol, 20-35 gm of total fiber daily;Understanding of distribution of calorie intake throughout the day with the  consumption of 4-5 meals/snacks    Heart Failure Yes    Intervention Provide a combined exercise and nutrition program that is supplemented with education, support and counseling about heart failure. Directed toward relieving symptoms such as shortness of breath, decreased exercise tolerance, and extremity edema.    Expected Outcomes Long term: Adoption of self-care skills and reduction of barriers for early signs and symptoms recognition and intervention leading to self-care  maintenance.;Short term: Daily weights obtained and reported for increase. Utilizing diuretic protocols set by physician.;Short term: Attendance in program 2-3 days a week with increased exercise capacity. Reported lower sodium intake. Reported increased fruit and vegetable intake. Reports medication compliance.;Improve functional capacity of life    Hypertension Yes    Intervention Provide education on lifestyle modifcations including regular physical activity/exercise, weight management, moderate sodium restriction and increased consumption of fresh fruit, vegetables, and low fat dairy, alcohol moderation, and smoking cessation.;Monitor prescription use compliance.    Expected Outcomes Short Term: Continued assessment and intervention until BP is < 140/73m HG in hypertensive participants. < 130/841mHG in hypertensive participants with diabetes, heart failure or chronic kidney disease.;Long Term: Maintenance of blood pressure at goal levels.    Lipids Yes    Intervention Provide education and support for participant on nutrition & aerobic/resistive exercise along with prescribed medications to achieve LDL '70mg'$ , HDL >'40mg'$ .    Expected Outcomes Short Term: Participant states understanding of desired cholesterol values and is compliant with medications prescribed. Participant is following exercise prescription and nutrition guidelines.;Long Term: Cholesterol controlled with medications as prescribed, with individualized exercise RX  and with personalized nutrition plan. Value goals: LDL < '70mg'$ , HDL > 40 mg.    Personal Goal Other Yes    Personal Goal Short term: back to ADL's Long term: strenght and stamina    Intervention Will continue to monitor pt and progress workloads as tolerated without sign or symptom    Expected Outcomes Pt will achieve her goals             Core Components/Risk Factors/Patient Goals Review:   Goals and Risk Factor Review     Row Name 08/05/22 1226             Core Components/Risk Factors/Patient Goals Review   Personal Goals Review Weight Management/Obesity;Heart Failure;Hypertension;Lipids       Review BrAtonyas off to a good start to exercise at intensive cardiac rehab. Vital signs have been stable. BrFlavias decondtioned. Will continue to monitor.       Expected Outcomes BrAadhiraill continue to participate in intensive cardiac rehab twice a week for exercise, nutrition and lifestyle modifications                Core Components/Risk Factors/Patient Goals at Discharge (Final Review):   Goals and Risk Factor Review - 08/05/22 1226       Core Components/Risk Factors/Patient Goals Review   Personal Goals Review Weight Management/Obesity;Heart Failure;Hypertension;Lipids    Review BrEmanis off to a good start to exercise at intensive cardiac rehab. Vital signs have been stable. BrNeetis decondtioned. Will continue to monitor.    Expected Outcomes BrMichaellaill continue to participate in intensive cardiac rehab twice a week for exercise, nutrition and lifestyle modifications             ITP Comments:  ITP Comments     Row Name 07/17/22 0859 08/05/22 1223         ITP Comments Dr TrFransico HimD, Medical Director. Introduction to [facility]/ Intensive Cardiac Rehab. Initial Orientation Packet Reviewed with the patient 30 Day ITP Review. BrJaraes off to a good start to exercise for her fitness level.               Comments: See ITP comments.MaHarrell GaveN BSN

## 2022-08-08 ENCOUNTER — Encounter (HOSPITAL_COMMUNITY)
Admission: RE | Admit: 2022-08-08 | Discharge: 2022-08-08 | Disposition: A | Discharge status: Home / Self Care | Payer: Medicare HMO | Source: Ambulatory Visit | Attending: Internal Medicine | Admitting: Internal Medicine

## 2022-08-08 DIAGNOSIS — Z48812 Encounter for surgical aftercare following surgery on the circulatory system: Secondary | ICD-10-CM | POA: Diagnosis not present

## 2022-08-08 DIAGNOSIS — I5022 Chronic systolic (congestive) heart failure: Secondary | ICD-10-CM | POA: Diagnosis not present

## 2022-08-08 DIAGNOSIS — I213 ST elevation (STEMI) myocardial infarction of unspecified site: Secondary | ICD-10-CM

## 2022-08-08 DIAGNOSIS — I252 Old myocardial infarction: Secondary | ICD-10-CM | POA: Diagnosis not present

## 2022-08-11 ENCOUNTER — Encounter (HOSPITAL_COMMUNITY)
Admission: RE | Admit: 2022-08-11 | Discharge: 2022-08-11 | Disposition: A | Discharge status: Home / Self Care | Payer: Medicare HMO | Source: Ambulatory Visit | Attending: Internal Medicine | Admitting: Internal Medicine

## 2022-08-11 DIAGNOSIS — Z48812 Encounter for surgical aftercare following surgery on the circulatory system: Secondary | ICD-10-CM | POA: Diagnosis not present

## 2022-08-11 DIAGNOSIS — I213 ST elevation (STEMI) myocardial infarction of unspecified site: Secondary | ICD-10-CM

## 2022-08-11 DIAGNOSIS — I5022 Chronic systolic (congestive) heart failure: Secondary | ICD-10-CM | POA: Diagnosis not present

## 2022-08-11 DIAGNOSIS — I252 Old myocardial infarction: Secondary | ICD-10-CM | POA: Diagnosis not present

## 2022-08-15 ENCOUNTER — Encounter (HOSPITAL_COMMUNITY)
Admission: RE | Admit: 2022-08-15 | Discharge: 2022-08-15 | Disposition: A | Discharge status: Home / Self Care | Payer: Medicare HMO | Source: Ambulatory Visit | Attending: Internal Medicine | Admitting: Internal Medicine

## 2022-08-15 DIAGNOSIS — I252 Old myocardial infarction: Secondary | ICD-10-CM | POA: Diagnosis not present

## 2022-08-15 DIAGNOSIS — Z48812 Encounter for surgical aftercare following surgery on the circulatory system: Secondary | ICD-10-CM | POA: Diagnosis not present

## 2022-08-15 DIAGNOSIS — I213 ST elevation (STEMI) myocardial infarction of unspecified site: Secondary | ICD-10-CM

## 2022-08-15 DIAGNOSIS — I5022 Chronic systolic (congestive) heart failure: Secondary | ICD-10-CM

## 2022-08-18 ENCOUNTER — Encounter (HOSPITAL_COMMUNITY)
Admission: RE | Admit: 2022-08-18 | Discharge: 2022-08-18 | Disposition: A | Discharge status: Home / Self Care | Payer: Medicare HMO | Source: Ambulatory Visit | Attending: Internal Medicine | Admitting: Internal Medicine

## 2022-08-18 DIAGNOSIS — I213 ST elevation (STEMI) myocardial infarction of unspecified site: Secondary | ICD-10-CM | POA: Diagnosis not present

## 2022-08-18 DIAGNOSIS — I5022 Chronic systolic (congestive) heart failure: Secondary | ICD-10-CM | POA: Insufficient documentation

## 2022-08-22 ENCOUNTER — Encounter (HOSPITAL_COMMUNITY)
Admission: RE | Admit: 2022-08-22 | Discharge: 2022-08-22 | Disposition: A | Discharge status: Home / Self Care | Payer: Medicare HMO | Source: Ambulatory Visit | Attending: Internal Medicine | Admitting: Internal Medicine

## 2022-08-22 DIAGNOSIS — I5022 Chronic systolic (congestive) heart failure: Secondary | ICD-10-CM | POA: Diagnosis not present

## 2022-08-22 DIAGNOSIS — I213 ST elevation (STEMI) myocardial infarction of unspecified site: Secondary | ICD-10-CM

## 2022-08-25 ENCOUNTER — Encounter (HOSPITAL_COMMUNITY)
Admission: RE | Admit: 2022-08-25 | Discharge: 2022-08-25 | Disposition: A | Discharge status: Home / Self Care | Payer: Medicare HMO | Source: Ambulatory Visit | Attending: Internal Medicine | Admitting: Internal Medicine

## 2022-08-25 DIAGNOSIS — I5022 Chronic systolic (congestive) heart failure: Secondary | ICD-10-CM | POA: Diagnosis not present

## 2022-08-25 DIAGNOSIS — I213 ST elevation (STEMI) myocardial infarction of unspecified site: Secondary | ICD-10-CM | POA: Diagnosis not present

## 2022-08-25 NOTE — Progress Notes (Signed)
Reviewed home exercise Rx with patient today.  Encouraged warm-up, cool-down, and stretching. Reviewed THRR of  56 - 112 and keeping RPE between 11-13. Encouraged to hydrate with activity.  Reviewed weather parameters for temperature and humidity for safe exercise outdoors. Reviewed S/S to terminate exercise and when to call 911 vs MD. Pt encouraged to always carry a cell phone for safety when exercising outdoors. Pt verbalized understanding of the home exercise Rx and was provided a copy.   Brizza Nathanson MS, ACSM-CEP, CCRP  

## 2022-08-26 NOTE — Progress Notes (Signed)
Cardiac Individual Treatment Plan  Patient Details  Name: Amy [name] MRN: [id number] Date of Birth: 10/14/1946 Referring Provider:   Flowsheet Row INTENSIVE CARDIAC REHAB ORIENT from 07/17/2022 in [facility]  Referring Provider Dr. Glori Bickers MD       Initial Encounter Date:  [place] from 07/17/2022 in [facility]  Date 07/17/22       Visit Diagnosis: ST elevation myocardial infarction (STEMI), unspecified artery (HCC)  Heart failure, chronic systolic (HCC)  Patient's Home Medications on Admission:  Current Outpatient Medications:    acetaminophen (TYLENOL) 500 MG tablet, Take 500 mg by mouth every 6 (six) hours as needed for mild pain or headache., Disp: , Rfl:    aspirin 81 MG EC tablet, Take 81 mg by mouth every evening., Disp: , Rfl:    atorvastatin (LIPITOR) 80 MG tablet, Take 1 tablet (80 mg total) by mouth daily., Disp: 30 tablet, Rfl: 0   Carboxymethylcellulose Sodium (DRY EYE RELIEF OP), Place 1 drop into both eyes 4 (four) times daily as needed (dryness)., Disp: , Rfl:    clopidogrel (PLAVIX) 75 MG tablet, Take 1 tablet (75 mg total) by mouth daily., Disp: 31 tablet, Rfl: 2   Coenzyme Q10 (CO Q-10) 200 MG CAPS, Take 200 mg by mouth daily., Disp: , Rfl:    Cyanocobalamin (VITAMIN B 12 PO), Take 1 tablet by mouth daily., Disp: , Rfl:    digoxin (LANOXIN) 0.125 MG tablet, Take 1 tablet (0.125 mg total) by mouth daily., Disp: 31 tablet, Rfl: 2   empagliflozin (JARDIANCE) 10 MG TABS tablet, Take 1 tablet (10 mg total) by mouth daily., Disp: 30 tablet, Rfl: 11   meclizine (ANTIVERT) 25 MG tablet, Take 25 mg by mouth daily., Disp: , Rfl:    metoprolol succinate (TOPROL XL) 25 MG 24 hr tablet, Take 1/2 tab Daily, Disp: 15 tablet, Rfl: 11   Multiple Vitamin (MULTI-VITAMIN) tablet, Take 1 tablet by mouth daily., Disp: , Rfl:    Pramoxine-Dimethicone (GOLD BOND INTENSIVE  HEALING EX), Apply 1 application topically daily as needed (eczema)., Disp: , Rfl:    Prasterone, DHEA, (DHEA PO), Take by mouth., Disp: , Rfl:    torsemide (DEMADEX) 20 MG tablet, Take 10 mg by mouth daily as needed (fluid retention.)., Disp: , Rfl:   Past Medical History: Past Medical History:  Diagnosis Date   CHF (congestive heart failure) (HCC)    Coronary artery disease    GERD (gastroesophageal reflux disease)    Hyperlipidemia    Obesity    Pre-diabetes     Tobacco Use: Social History   Tobacco Use  Smoking Status Never  Smokeless Tobacco Never    Labs: Review Flowsheet  More data exists      Latest Ref Rng & Units 03/01/2022 03/02/2022 03/03/2022 03/04/2022 03/05/2022  Labs for ITP Cardiac and Pulmonary Rehab  O2 Saturation % 72.5  72.6  58.4  62.3  66.1     Capillary Blood Glucose: Lab Results  Component Value Date   GLUCAP 184 (H) 03/05/2022   GLUCAP 96 03/05/2022   GLUCAP 131 (H) 03/05/2022   GLUCAP 153 (H) 03/04/2022   GLUCAP 120 (H) 03/04/2022     Exercise Target Goals: Exercise Program Goal: Individual exercise prescription set using results from initial 6 min walk test and THRR while considering  patient's activity barriers and safety.   Exercise Prescription Goal: Initial exercise prescription builds to 30-45 minutes a day  of aerobic activity, 2-3 days per week.  Home exercise guidelines will be given to patient during program as part of exercise prescription that the participant will acknowledge.  Activity Barriers & Risk Stratification:  Activity Barriers & Cardiac Risk Stratification - 07/17/22 1024       Activity Barriers & Cardiac Risk Stratification   Activity Barriers Neck/Spine Problems;Joint Problems;Balance Concerns;Deconditioning;Muscular Weakness;History of Falls;Left Knee Replacement;Right Knee Replacement    Cardiac Risk Stratification High             6 Minute Walk:  6 Minute Walk     Row Name 07/17/22 1009         6  Minute Walk   Phase Initial  Nustep test: poor balance, dizziness and assistive device     Distance 886 feet     Walk Time 6 minutes     # of Rest Breaks 0     MPH 1.68     METS 1.2     RPE 9     Perceived Dyspnea  0     VO2 Peak 4.2     Symptoms No     Resting HR 93 bpm     Resting BP 108/78     Resting Oxygen Saturation  95 %     Exercise Oxygen Saturation  during 6 min walk 94 %     Max Ex. HR 102 bpm     Max Ex. BP 114/74     2 Minute Post BP 110/74              Oxygen Initial Assessment:   Oxygen Re-Evaluation:   Oxygen Discharge (Final Oxygen Re-Evaluation):   Initial Exercise Prescription:  Initial Exercise Prescription - 07/17/22 1000       Date of Initial Exercise RX and Referring Provider   Date 07/17/22    Referring Provider Dr. Glori Bickers MD    Expected Discharge Date 09/12/22      NuStep   Level 1    SPM 53    Minutes 20    METs 1.3      Prescription Details   Frequency (times per week) 2    Duration Progress to 30 minutes of continuous aerobic without signs/symptoms of physical distress      Intensity   THRR 40-80% of Max Heartrate 58-116    Ratings of Perceived Exertion 11-13    Perceived Dyspnea 0-4      Progression   Progression Continue progressive overload as per policy without signs/symptoms or physical distress.      Resistance Training   Training Prescription Yes    Weight 2    Reps 10-15             Perform Capillary Blood Glucose checks as needed.  Exercise Prescription Changes:   Exercise Prescription Changes     Row Name 07/25/22 1100 08/04/22 1500 08/22/22 1412         Response to Exercise   Blood Pressure (Admit) 110/60 104/65 118/72     Blood Pressure (Exercise) 108/78 110/70 124/74     Blood Pressure (Exit) 108/62 110/64 112/70     Heart Rate (Admit) 76 bpm 89 bpm 79 bpm     Heart Rate (Exercise) 101 bpm 109 bpm 108 bpm     Heart Rate (Exit) 81 bpm 89 bpm 75 bpm     Rating of Perceived  Exertion (Exercise) '12 11 12     '$ Symptoms None None None     Comments  Pt's first day in the [facility] program Reviewed METs Reviewed METs, goals and home exercise Rx     Duration Continue with 30 min of aerobic exercise without signs/symptoms of physical distress. Progress to 30 minutes of  aerobic without signs/symptoms of physical distress Progress to 30 minutes of  aerobic without signs/symptoms of physical distress     Intensity THRR unchanged THRR unchanged THRR unchanged       Progression   Progression Continue to progress workloads to maintain intensity without signs/symptoms of physical distress. Continue to progress workloads to maintain intensity without signs/symptoms of physical distress. Continue to progress workloads to maintain intensity without signs/symptoms of physical distress.     Average METs 1.3 1.5 1.7       Resistance Training   Training Prescription Yes Yes Yes     Weight 2 lbs 2 lbs 2 lbs     Reps 10-15 10-15 10-15     Time 10 Minutes 10 Minutes 10 Minutes       Interval Training   Interval Training No No No       NuStep   Level '1 1 1     '$ SPM 56 -- --     Minutes '22 25 30     '$ METs 1.3 1.5 107       Home Exercise Plan   Plans to continue exercise at -- -- Home (comment)     Frequency -- -- Add 2 additional days to program exercise sessions.     Initial Home Exercises Provided -- -- 08/22/22              Exercise Comments:   Exercise Comments     Row Name 07/25/22 1107 08/04/22 1547 08/25/22 1418       Exercise Comments Pt's first day in the [facility] program. Pt voiced no complaints. Reviewed METs. Slow progress in thsi deconitioned patient. Pt attentens the CRP2 program 2x/week. Reviewed METs, goals and home exercise Rx. Progression is slow. Voices impeovement in ADL's. Pt verbalized understanding of the home exercise RX and was provided a copy.              Exercise Goals and Review:   Exercise Goals     Row Name 07/17/22 1030              Exercise Goals   Increase Physical Activity Yes       Intervention Provide advice, education, support and counseling about physical activity/exercise needs.;Develop an individualized exercise prescription for aerobic and resistive training based on initial evaluation findings, risk stratification, comorbidities and participant's personal goals.       Expected Outcomes Short Term: Attend rehab on a regular basis to increase amount of physical activity.;Long Term: Add in home exercise to make exercise part of routine and to increase amount of physical activity.;Long Term: Exercising regularly at least 3-5 days a week.       Increase Strength and Stamina Yes       Intervention Provide advice, education, support and counseling about physical activity/exercise needs.;Develop an individualized exercise prescription for aerobic and resistive training based on initial evaluation findings, risk stratification, comorbidities and participant's personal goals.       Expected Outcomes Short Term: Increase workloads from initial exercise prescription for resistance, speed, and METs.;Short Term: Perform resistance training exercises routinely during rehab and add in resistance training at home;Long Term: Improve cardiorespiratory fitness, muscular endurance and strength as measured by increased METs and functional capacity (6MWT)  Able to understand and use rate of perceived exertion (RPE) scale Yes       Intervention Provide education and explanation on how to use RPE scale       Expected Outcomes Short Term: Able to use RPE daily in rehab to express subjective intensity level;Long Term:  Able to use RPE to guide intensity level when exercising independently       Knowledge and understanding of Target Heart Rate Range (THRR) Yes       Intervention Provide education and explanation of THRR including how the numbers were predicted and where they are located for reference       Expected Outcomes Short Term: Able to  state/look up THRR;Long Term: Able to use THRR to govern intensity when exercising independently;Short Term: Able to use daily as guideline for intensity in rehab       Understanding of Exercise Prescription Yes       Intervention Provide education, explanation, and written materials on patient's individual exercise prescription       Expected Outcomes Short Term: Able to explain program exercise prescription;Long Term: Able to explain home exercise prescription to exercise independently                Exercise Goals Re-Evaluation :  Exercise Goals Re-Evaluation     Row Name 07/25/22 1105 08/22/22 1416           Exercise Goal Re-Evaluation   Exercise Goals Review Increase Physical Activity;Increase Strength and Stamina;Able to understand and use rate of perceived exertion (RPE) scale;Knowledge and understanding of Target Heart Rate Range (THRR);Understanding of Exercise Prescription Increase Physical Activity;Increase Strength and Stamina;Able to understand and use rate of perceived exertion (RPE) scale;Knowledge and understanding of Target Heart Rate Range (THRR);Understanding of Exercise Prescription      Comments Pt's first day in the [facility] program. Pt understands the exercise Rx, RPE scale and THRR. Reviewed METs, goals and home exercise Rx. Pt voices that she is walking better. She also voices that she has more energy for housework and ADL's. Pt is doing chair exercises for 30 minutes 2-3 x/week.      Expected Outcomes Will continue to monitor patient and progress exercise workloads as tolerated. Will continue to monitor patient and progress exercise workloads as tolerated.               Discharge Exercise Prescription (Final Exercise Prescription Changes):  Exercise Prescription Changes - 08/22/22 1412       Response to Exercise   Blood Pressure (Admit) 118/72    Blood Pressure (Exercise) 124/74    Blood Pressure (Exit) 112/70    Heart Rate (Admit) 79 bpm    Heart Rate  (Exercise) 108 bpm    Heart Rate (Exit) 75 bpm    Rating of Perceived Exertion (Exercise) 12    Symptoms None    Comments Reviewed METs, goals and home exercise Rx    Duration Progress to 30 minutes of  aerobic without signs/symptoms of physical distress    Intensity THRR unchanged      Progression   Progression Continue to progress workloads to maintain intensity without signs/symptoms of physical distress.    Average METs 1.7      Resistance Training   Training Prescription Yes    Weight 2 lbs    Reps 10-15    Time 10 Minutes      Interval Training   Interval Training No      NuStep   Level 1  Minutes 30    METs 107      Home Exercise Plan   Plans to continue exercise at Home (comment)    Frequency Add 2 additional days to program exercise sessions.    Initial Home Exercises Provided 08/22/22             Nutrition:  Target Goals: Understanding of nutrition guidelines, daily intake of sodium '1500mg'$ , cholesterol '200mg'$ , calories 30% from fat and 7% or less from saturated fats, daily to have 5 or more servings of fruits and vegetables.  Biometrics:  Pre Biometrics - 07/17/22 1031       Pre Biometrics   Height '4\' 9"'$  (1.448 m)    Weight 76.2 kg    Waist Circumference 42.5 inches    Hip Circumference 42.75 inches    Waist to Hip Ratio 0.99 %    BMI (Calculated) 36.34    Triceps Skinfold 38 mm    % Body Fat 50.2 %    Grip Strength 13 kg    Flexibility --   Did not attempt. mobility issues   Single Leg Stand --   Did not attempt, assistive device, previous falls and balance issues             Nutrition Therapy Plan and Nutrition Goals:  Nutrition Therapy & Goals - 08/18/22 0944       Nutrition Therapy   Diet Heart Healthy Diet    Drug/Food Interactions Statins/Certain Fruits      Personal Nutrition Goals   Nutrition Goal Patient to identify strategies for managing cardiovascular risk by attending the [facility] education and nutrition classes weekly     Personal Goal #2 Patient to reduce sodium intake to '1500mg'$  daily.    Personal Goal #3 Patient to identify food sources and limit daily intake of saturated fat, trans fat, sodium, and refined carbohydrates    Comments Capria continues to report following a heart healthy diet. Her daughter lives at home with her. She reports reduction of take-out meals since the pandemic. Nova does report mindfulness of saturated fat intake and limiting red meat, sausage, etc. She is trying to do more of her own cooking instead of relying on her daughter who does not always practice heart healty cooking. Weight is up 3.7# since starting with our program.      Intervention Plan   Intervention Prescribe, educate and counsel regarding individualized specific dietary modifications aiming towards targeted core components such as weight, hypertension, lipid management, diabetes, heart failure and other comorbidities.;Nutrition handout(s) given to patient.    Expected Outcomes Short Term Goal: Understand basic principles of dietary content, such as calories, fat, sodium, cholesterol and nutrients.;Long Term Goal: Adherence to prescribed nutrition plan.             Nutrition Assessments:  MEDIFICTS Score Key: ?70 Need to make dietary changes  40-70 Heart Healthy Diet ? 40 Therapeutic Level Cholesterol Diet    Picture Your Plate Scores: <29 Unhealthy dietary pattern with much room for improvement. 41-50 Dietary pattern unlikely to meet recommendations for good health and room for improvement. 51-60 More healthful dietary pattern, with some room for improvement.  >60 Healthy dietary pattern, although there may be some specific behaviors that could be improved.    Nutrition Goals Re-Evaluation:  Nutrition Goals Re-Evaluation     [place] Name 07/25/22 1213 08/18/22 0944           Goals   Current Weight 169 lb 15.6 oz (77.1 kg) 171 lb 11.8 oz (77.9  kg)      Comment A1c 6.3, lipids improved- HDL 26. No new  labs at this time; most recent labs show A1c in the prediabetic range at 6.3      Expected Outcome Patient reports interest in learning more about food and nutrition. Goals in progress. Raymie continues to report following a heart healthy diet. Her daughter lives at home with her. She reports reduction of take-out meals since the pandemic. Laquesha does report mindfulness of saturated fat intake and limiting red meat, sausage, etc. She is trying to do more of her own cooking instead of relying on her daughter who does not always practice heart healty cooking. Weight is up 3.7# since starting with our program. A1c in the prediabetic range and glucose reamins elevated over the last 3 months. She does deny snacking. Reviewed eliminating sugary beverages and encouraged high fiber food intake including fruit and vegetables.               Nutrition Goals Re-Evaluation:  Nutrition Goals Re-Evaluation     [place] Name 07/25/22 1213 08/18/22 0944           Goals   Current Weight 169 lb 15.6 oz (77.1 kg) 171 lb 11.8 oz (77.9 kg)      Comment A1c 6.3, lipids improved- HDL 26. No new labs at this time; most recent labs show A1c in the prediabetic range at 6.3      Expected Outcome Patient reports interest in learning more about food and nutrition. Goals in progress. Mallissa continues to report following a heart healthy diet. Her daughter lives at home with her. She reports reduction of take-out meals since the pandemic. Keysi does report mindfulness of saturated fat intake and limiting red meat, sausage, etc. She is trying to do more of her own cooking instead of relying on her daughter who does not always practice heart healty cooking. Weight is up 3.7# since starting with our program. A1c in the prediabetic range and glucose reamins elevated over the last 3 months. She does deny snacking. Reviewed eliminating sugary beverages and encouraged high fiber food intake including fruit and vegetables.                Nutrition Goals Discharge (Final Nutrition Goals Re-Evaluation):  Nutrition Goals Re-Evaluation - 08/18/22 0944       Goals   Current Weight 171 lb 11.8 oz (77.9 kg)    Comment No new labs at this time; most recent labs show A1c in the prediabetic range at 6.3    Expected Outcome Goals in progress. Brittyn continues to report following a heart healthy diet. Her daughter lives at home with her. She reports reduction of take-out meals since the pandemic. Eilee does report mindfulness of saturated fat intake and limiting red meat, sausage, etc. She is trying to do more of her own cooking instead of relying on her daughter who does not always practice heart healty cooking. Weight is up 3.7# since starting with our program. A1c in the prediabetic range and glucose reamins elevated over the last 3 months. She does deny snacking. Reviewed eliminating sugary beverages and encouraged high fiber food intake including fruit and vegetables.             Psychosocial: Target Goals: Acknowledge presence or absence of significant depression and/or stress, maximize coping skills, provide positive support system. Participant is able to verbalize types and ability to use techniques and skills needed for reducing stress and depression.  Initial Review & Psychosocial  Screening:  Initial Psych Review & Screening - 07/17/22 1226       Initial Review   Current issues with None Identified      Family Dynamics   Good Support System? Yes   Aleah lives with her daughter     Barriers   Psychosocial barriers to participate in program There are no identifiable barriers or psychosocial needs.      Screening Interventions   Interventions Encouraged to exercise             Quality of Life Scores:  Quality of Life - 07/17/22 1035       Quality of Life   Select Quality of Life      Quality of Life Scores   Health/Function Pre 25.62 %    Socioeconomic Pre 28.5 %    Psych/Spiritual Pre 29.14 %     Family Pre 24.38 %    GLOBAL Pre 26.79 %            Scores of 19 and below usually indicate a poorer quality of life in these areas.  A difference of  2-3 points is a clinically meaningful difference.  A difference of 2-3 points in the total score of the Quality of Life Index has been associated with significant improvement in overall quality of life, self-image, physical symptoms, and general health in studies assessing change in quality of life.  PHQ-9: Review Flowsheet       07/17/2022  Depression screen PHQ 2/9  Decreased Interest 0  Down, Depressed, Hopeless 0  PHQ - 2 Score 0   Interpretation of Total Score  Total Score Depression Severity:  1-4 = Minimal depression, 5-9 = Mild depression, 10-14 = Moderate depression, 15-19 = Moderately severe depression, 20-27 = Severe depression   Psychosocial Evaluation and Intervention:  Psychosocial Evaluation - 07/25/22 1402       Psychosocial Evaluation & Interventions   Interventions Encouraged to exercise with the program and follow exercise prescription    Comments There are no stressors currently identified; pt encouraged to exercise per EP prescription.    Expected Outcomes Patient will increase her excercise stamina and ability.    Continue Psychosocial Services  No Follow up required             Psychosocial Re-Evaluation:  Psychosocial Re-Evaluation     [place] Name 08/05/22 1224 08/26/22 1551           Psychosocial Re-Evaluation   Current issues with None Identified None Identified      Interventions Encouraged to attend Cardiac Rehabilitation for the exercise Encouraged to attend Cardiac Rehabilitation for the exercise      Continue Psychosocial Services  No Follow up required No Follow up required               Psychosocial Discharge (Final Psychosocial Re-Evaluation):  Psychosocial Re-Evaluation - 08/26/22 1551       Psychosocial Re-Evaluation   Current issues with None Identified    Interventions  Encouraged to attend Cardiac Rehabilitation for the exercise    Continue Psychosocial Services  No Follow up required             Vocational Rehabilitation: Provide vocational rehab assistance to qualifying candidates.   Vocational Rehab Evaluation & Intervention:  Vocational Rehab - 07/17/22 1227       Initial Vocational Rehab Evaluation & Intervention   Assessment shows need for Vocational Rehabilitation No   Alayziah is retired and does not need vocational rehab at this time  Education: Education Goals: Education classes will be provided on a weekly basis, covering required topics. Participant will state understanding/return demonstration of topics presented.    Education     Row Name 07/25/22 0900     Education   Cardiac Education Topics [facility]   Select Core Videos     Core Videos   Educator Dietitian   Select Nutrition   Nutrition Other  Label Reading   Instruction Review Code 1- Verbalizes Understanding   Class Start Time [phone]   Class Stop Time 0856   Class Time Calculation (min) 44 min    [place] Name 08/01/22 0900     Education   Cardiac Education Topics [facility]   Select Workshops     Workshops   Educator Exercise Physiologist   Select Psychosocial   Psychosocial Workshop Managing Moods and Relationships   Instruction Review Code 1- Verbalizes Understanding   Class Start Time 0810   Class Stop Time 0851   Class Time Calculation (min) 41 min    [place] Name 08/04/22 0900     Education   Cardiac Education Topics [place]   Select Workshops     Workshops   Educator Exercise Physiologist   Select Exercise   Exercise Workshop Exercise Basics: Building Your Action Plan   Instruction Review Code 1- Verbalizes Understanding   Class Start Time [phone]   Class Stop Time 0855   Class Time Calculation (min) 43 min    [place] Name 08/08/22 0900     Education   Cardiac Education Topics [facility]   Dispensing optician Exercise Education   Exercise Education Move It!   Instruction Review Code 1- Verbalizes Understanding   Class Start Time 0815   Class Stop Time 0853   Class Time Calculation (min) 38 min    [place] Name 08/11/22 0900     Education   Cardiac Education Topics [facility]   Select Core Videos     Core Videos   Educator Dietitian   Select Nutrition   Nutrition Nutrition Action Plan   Instruction Review Code 1- Verbalizes Understanding   Class Start Time 0815   Class Stop Time 0852   Class Time Calculation (min) 37 min    [place] Name 08/15/22 0900     Education   Cardiac Education Topics [facility]   Select Core Videos     Core Videos   Educator Nurse   Select General Education   General Education Hypertension and Heart Disease   Instruction Review Code 1- Verbalizes Understanding   Class Start Time 0815   Class Stop Time 0850   Class Time Calculation (min) 35 min    Row Name 08/18/22 0900     Education   Cardiac Education Topics [facility]   Select Core Videos     Core Videos   Educator Dietitian   Select Nutrition   Nutrition Dining Out - Part 1   Instruction Review Code 1- Verbalizes Understanding   Class Start Time 0815   Class Stop Time 0850   Class Time Calculation (min) 35 min    [place] Name 08/22/22 0900     Education   Cardiac Education Topics Pritikin   Select Workshops     Workshops   Educator Exercise Physiologist   Select Psychosocial   Psychosocial Workshop New Thoughts, New Behaviors   Instruction Review Code 1- Verbalizes Understanding   Class Start Time [phone]   Class Stop  Time 0848   Class Time Calculation (min) 38 min    Row Name 08/25/22 0900     Education   Cardiac Education Topics Pritikin   Select Core Videos     Core Videos   Educator Exercise Physiologist   Select Exercise Education   Exercise Education Biomechanial Limitations   Instruction Review Code 1- Verbalizes Understanding   Class Start Time 0815    Class Stop Time 0900   Class Time Calculation (min) 45 min            Core Videos: Exercise    Move It!  Clinical staff conducted group or individual video education with verbal and written material and guidebook.  Patient learns the recommended Pritikin exercise program. Exercise with the goal of living a long, healthy life. Some of the health benefits of exercise include controlled diabetes, healthier blood pressure levels, improved cholesterol levels, improved heart and lung capacity, improved sleep, and better body composition. Everyone should speak with their doctor before starting or changing an exercise routine.  Biomechanical Limitations Clinical staff conducted group or individual video education with verbal and written material and guidebook.  Patient learns how biomechanical limitations can impact exercise and how we can mitigate and possibly overcome limitations to have an impactful and balanced exercise routine.  Body Composition Clinical staff conducted group or individual video education with verbal and written material and guidebook.  Patient learns that body composition (ratio of muscle mass to fat mass) is a key component to assessing overall fitness, rather than body weight alone. Increased fat mass, especially visceral belly fat, can put [place] at increased risk for metabolic syndrome, type 2 diabetes, heart disease, and even death. It is recommended to combine diet and exercise (cardiovascular and resistance training) to improve your body composition. Seek guidance from your physician and exercise physiologist before implementing an exercise routine.  Exercise Action Plan Clinical staff conducted group or individual video education with verbal and written material and guidebook.  Patient learns the recommended strategies to achieve and enjoy long-term exercise adherence, including variety, self-motivation, self-efficacy, and positive decision making. Benefits of exercise  include fitness, good health, weight management, more energy, better sleep, less stress, and overall well-being.  Medical   Heart Disease Risk Reduction Clinical staff conducted group or individual video education with verbal and written material and guidebook.  Patient learns our heart is our most vital organ as it circulates oxygen, nutrients, white blood cells, and hormones throughout the entire body, and carries waste away. Data supports a plant-based eating plan like the Pritikin Program for its effectiveness in slowing progression of and reversing heart disease. The video provides a number of recommendations to address heart disease.   Metabolic Syndrome and Belly Fat  Clinical staff conducted group or individual video education with verbal and written material and guidebook.  Patient learns what metabolic syndrome is, how it leads to heart disease, and how one can reverse it and keep it from coming back. You have metabolic syndrome if you have 3 of the following 5 criteria: abdominal obesity, high blood pressure, high triglycerides, low HDL cholesterol, and high blood sugar.  Hypertension and Heart Disease Clinical staff conducted group or individual video education with verbal and written material and guidebook.  Patient learns that high blood pressure, or hypertension, is very common in the [place]. Hypertension is largely due to excessive salt intake, but other important risk factors include being overweight, physical inactivity, drinking too much alcohol, smoking, and not eating enough  potassium from fruits and vegetables. High blood pressure is a leading risk factor for heart attack, stroke, congestive heart failure, dementia, kidney failure, and premature death. Long-term effects of excessive salt intake include stiffening of the arteries and thickening of heart muscle and organ damage. Recommendations include ways to reduce hypertension and the risk of heart disease.  Diseases of  Our Time - Focusing on Diabetes Clinical staff conducted group or individual video education with verbal and written material and guidebook.  Patient learns why the best way to stop diseases of our time is prevention, through food and other lifestyle changes. Medicine (such as prescription pills and surgeries) is often only a Band-Aid on the problem, not a long-term solution. Most common diseases of our time include obesity, type 2 diabetes, hypertension, heart disease, and cancer. The Pritikin Program is recommended and has been proven to help reduce, reverse, and/or prevent the damaging effects of metabolic syndrome.  Nutrition   Overview of the Pritikin Eating Plan  Clinical staff conducted group or individual video education with verbal and written material and guidebook.  Patient learns about the [place] for disease risk reduction. The [place] emphasizes a wide variety of unrefined, minimally-processed carbohydrates, like fruits, vegetables, whole grains, and legumes. Go, Caution, and Stop food choices are explained. Plant-based and lean animal proteins are emphasized. Rationale provided for low sodium intake for blood pressure control, low added sugars for blood sugar stabilization, and low added fats and oils for coronary artery disease risk reduction and weight management.  Calorie Density  Clinical staff conducted group or individual video education with verbal and written material and guidebook.  Patient learns about calorie density and how it impacts the Pritikin Eating Plan. Knowing the characteristics of the food you choose will help you decide whether those foods will lead to weight gain or weight loss, and whether you want to consume more or less of them. Weight loss is usually a side effect of the Pritikin Eating Plan because of its focus on low calorie-dense foods.  Label Reading  Clinical staff conducted group or individual video education with verbal and  written material and guidebook.  Patient learns about the Pritikin recommended label reading guidelines and corresponding recommendations regarding calorie density, added sugars, sodium content, and whole grains.  Dining Out - Part 1  Clinical staff conducted group or individual video education with verbal and written material and guidebook.  Patient learns that restaurant meals can be sabotaging because they can be so high in calories, fat, sodium, and/or sugar. Patient learns recommended strategies on how to positively address this and avoid unhealthy pitfalls.  Facts on Fats  Clinical staff conducted group or individual video education with verbal and written material and guidebook.  Patient learns that lifestyle modifications can be just as effective, if not more so, as many medications for lowering your risk of heart disease. A Pritikin lifestyle can help to reduce your risk of inflammation and atherosclerosis (cholesterol build-up, or plaque, in the artery walls). Lifestyle interventions such as dietary choices and physical activity address the cause of atherosclerosis. A review of the types of fats and their impact on blood cholesterol levels, along with dietary recommendations to reduce fat intake is also included.  Nutrition Action Plan  Clinical staff conducted group or individual video education with verbal and written material and guidebook.  Patient learns how to incorporate Pritikin recommendations into their lifestyle. Recommendations include planning and keeping personal health goals in mind as an important part  of their success.  Healthy Mind-Set    Healthy Minds, Bodies, Hearts  Clinical staff conducted group or individual video education with verbal and written material and guidebook.  Patient learns how to identify when they are stressed. Video will discuss the impact of that stress, as well as the many benefits of stress management. Patient will also be introduced to stress  management techniques. The way we think, act, and feel has an impact on our hearts.  How Our Thoughts Can Heal Our Hearts  Clinical staff conducted group or individual video education with verbal and written material and guidebook.  Patient learns that negative thoughts can cause depression and anxiety. This can result in negative lifestyle behavior and serious health problems. Cognitive behavioral therapy is an effective method to help control our thoughts in order to change and improve our emotional outlook.  Additional Videos:  Exercise    Improving Performance  Clinical staff conducted group or individual video education with verbal and written material and guidebook.  Patient learns to use a non-linear approach by alternating intensity levels and lengths of time spent exercising to help burn more calories and lose more body fat. Cardiovascular exercise helps improve heart health, metabolism, hormonal balance, blood sugar control, and recovery from fatigue. Resistance training improves strength, endurance, balance, coordination, reaction time, metabolism, and muscle mass. Flexibility exercise improves circulation, posture, and balance. Seek guidance from your physician and exercise physiologist before implementing an exercise routine and learn your capabilities and proper form for all exercise.  Introduction to Yoga  Clinical staff conducted group or individual video education with verbal and written material and guidebook.  Patient learns about yoga, a discipline of the coming together of mind, breath, and body. The benefits of yoga include improved flexibility, improved range of motion, better posture and core strength, increased lung function, weight loss, and positive self-image. Yoga's heart health benefits include lowered blood pressure, healthier heart rate, decreased cholesterol and triglyceride levels, improved immune function, and reduced stress. Seek guidance from your physician and  exercise physiologist before implementing an exercise routine and learn your capabilities and proper form for all exercise.  Medical   Aging: Enhancing Your Quality of Life  Clinical staff conducted group or individual video education with verbal and written material and guidebook.  Patient learns key strategies and recommendations to stay in good physical health and enhance quality of life, such as prevention strategies, having an advocate, securing a [place], and keeping a list of medications and system for tracking them. It also discusses how to avoid risk for bone loss.  Biology of Weight Control  Clinical staff conducted group or individual video education with verbal and written material and guidebook.  Patient learns that weight gain occurs because we consume more calories than we burn (eating more, moving less). Even if your body weight is normal, you may have higher ratios of fat compared to muscle mass. Too much body fat puts you at increased risk for cardiovascular disease, heart attack, stroke, type 2 diabetes, and obesity-related cancers. In addition to exercise, following the [place] can help reduce your risk.  Decoding Lab Results  Clinical staff conducted group or individual video education with verbal and written material and guidebook.  Patient learns that lab test reflects one measurement whose values change over time and are influenced by many factors, including medication, stress, sleep, exercise, food, hydration, pre-existing medical conditions, and more. It is recommended to use the knowledge from this  video to become more involved with your lab results and evaluate your numbers to speak with your doctor.   Diseases of Our Time - Overview  Clinical staff conducted group or individual video education with verbal and written material and guidebook.  Patient learns that according to the CDC, 50% to 70% of chronic diseases (such as  obesity, type 2 diabetes, elevated lipids, hypertension, and heart disease) are avoidable through lifestyle improvements including healthier food choices, listening to satiety cues, and increased physical activity.  Sleep Disorders Clinical staff conducted group or individual video education with verbal and written material and guidebook.  Patient learns how good quality and duration of sleep are important to overall health and well-being. Patient also learns about sleep disorders and how they impact health along with recommendations to address them, including discussing with a physician.  Nutrition  Dining Out - Part 2 Clinical staff conducted group or individual video education with verbal and written material and guidebook.  Patient learns how to plan ahead and communicate in order to maximize their dining experience in a healthy and nutritious manner. Included are recommended food choices based on the type of restaurant the patient is visiting.   Fueling a Best boy conducted group or individual video education with verbal and written material and guidebook.  There is a strong connection between our food choices and our health. Diseases like obesity and type 2 diabetes are very prevalent and are in large-part due to lifestyle choices. The Pritikin Eating Plan provides plenty of food and hunger-curbing satisfaction. It is easy to follow, affordable, and helps reduce health risks.  Menu Workshop  Clinical staff conducted group or individual video education with verbal and written material and guidebook.  Patient learns that restaurant meals can sabotage health goals because they are often packed with calories, fat, sodium, and sugar. Recommendations include strategies to plan ahead and to communicate with the manager, chef, or server to help order a healthier meal.  Planning Your Eating Strategy  Clinical staff conducted group or individual video education with verbal and  written material and guidebook.  Patient learns about the [place] and its benefit of reducing the risk of disease. The [place] does not focus on calories. Instead, it emphasizes high-quality, nutrient-rich foods. By knowing the characteristics of the foods, we choose, we can determine their calorie density and make informed decisions.  Targeting Your Nutrition Priorities  Clinical staff conducted group or individual video education with verbal and written material and guidebook.  Patient learns that lifestyle habits have a tremendous impact on disease risk and progression. This video provides eating and physical activity recommendations based on your personal health goals, such as reducing LDL cholesterol, losing weight, preventing or controlling type 2 diabetes, and reducing high blood pressure.  Vitamins and Minerals  Clinical staff conducted group or individual video education with verbal and written material and guidebook.  Patient learns different ways to obtain key vitamins and minerals, including through a recommended healthy diet. It is important to discuss all supplements you take with your doctor.   Healthy Mind-Set    Smoking Cessation  Clinical staff conducted group or individual video education with verbal and written material and guidebook.  Patient learns that cigarette smoking and tobacco addiction pose a serious health risk which affects millions of people. Stopping smoking will significantly reduce the risk of heart disease, lung disease, and many forms of cancer. Recommended strategies for quitting are covered, including working with your  doctor to develop a successful plan.  Culinary   Becoming a Financial trader conducted group or individual video education with verbal and written material and guidebook.  Patient learns that cooking at home can be healthy, cost-effective, quick, and puts them in control. Keys to cooking healthy recipes  will include looking at your recipe, assessing your equipment needs, planning ahead, making it simple, choosing cost-effective seasonal ingredients, and limiting the use of added fats, salts, and sugars.  Cooking - Breakfast and Snacks  Clinical staff conducted group or individual video education with verbal and written material and guidebook.  Patient learns how important breakfast is to satiety and nutrition through the entire day. Recommendations include key foods to eat during breakfast to help stabilize blood sugar levels and to prevent overeating at meals later in the day. Planning ahead is also a key component.  Cooking - Human resources officer conducted group or individual video education with verbal and written material and guidebook.  Patient learns eating strategies to improve overall health, including an approach to cook more at home. Recommendations include thinking of animal protein as a side on your plate rather than center stage and focusing instead on lower calorie dense options like vegetables, fruits, whole grains, and plant-based proteins, such as beans. Making sauces in large quantities to freeze for later and leaving the skin on your vegetables are also recommended to maximize your experience.  Cooking - Healthy Salads and Dressing Clinical staff conducted group or individual video education with verbal and written material and guidebook.  Patient learns that vegetables, fruits, whole grains, and legumes are the foundations of the [place]. Recommendations include how to incorporate each of these in flavorful and healthy salads, and how to create homemade salad dressings. Proper handling of ingredients is also covered. Cooking - Soups and [employer] - Soups and Desserts Clinical staff conducted group or individual video education with verbal and written material and guidebook.  Patient learns that Pritikin soups and desserts make for easy, nutritious,  and delicious snacks and meal components that are low in sodium, fat, sugar, and calorie density, while high in vitamins, minerals, and filling fiber. Recommendations include simple and healthy ideas for soups and desserts.   Overview     The Pritikin Solution Program Overview Clinical staff conducted group or individual video education with verbal and written material and guidebook.  Patient learns that the results of the [place] Program have been documented in more than 100 articles published in peer-reviewed journals, and the benefits include reducing risk factors for (and, in some cases, even reversing) high cholesterol, high blood pressure, type 2 diabetes, obesity, and more! An overview of the three key pillars of the Pritikin Program will be covered: eating well, doing regular exercise, and having a healthy mind-set.  WORKSHOPS  Exercise: Exercise Basics: Building Your Action Plan Clinical staff led group instruction and group discussion with PowerPoint presentation and patient guidebook. To enhance the learning environment the use of posters, models and videos may be added. At the conclusion of this workshop, patients will comprehend the difference between physical activity and exercise, as well as the benefits of incorporating both, into their routine. Patients will understand the FITT (Frequency, Intensity, Time, and Type) principle and how to use it to build an exercise action plan. In addition, safety concerns and other considerations for exercise and cardiac rehab will be addressed by the presenter. The purpose of this lesson is to promote a  comprehensive and effective weekly exercise routine in order to improve patients' overall level of fitness.   Managing Heart Disease: Your Path to a Healthier Heart Clinical staff led group instruction and group discussion with PowerPoint presentation and patient guidebook. To enhance the learning environment the use of posters, models and  videos may be added.At the conclusion of this workshop, patients will understand the anatomy and physiology of the heart. Additionally, they will understand how Pritikin's three pillars impact the risk factors, the progression, and the management of heart disease.  The purpose of this lesson is to provide a high-level overview of the heart, heart disease, and how the Pritikin lifestyle positively impacts risk factors.  Exercise Biomechanics Clinical staff led group instruction and group discussion with PowerPoint presentation and patient guidebook. To enhance the learning environment the use of posters, models and videos may be added. Patients will learn how the structural parts of their bodies function and how these functions impact their daily activities, movement, and exercise. Patients will learn how to promote a neutral spine, learn how to manage pain, and identify ways to improve their physical movement in order to promote healthy living. The purpose of this lesson is to expose patients to common physical limitations that impact physical activity. Participants will learn practical ways to adapt and manage aches and pains, and to minimize their effect on regular exercise. Patients will learn how to maintain good posture while sitting, walking, and lifting.  Balance Training and Fall Prevention  Clinical staff led group instruction and group discussion with PowerPoint presentation and patient guidebook. To enhance the learning environment the use of posters, models and videos may be added. At the conclusion of this workshop, patients will understand the importance of their sensorimotor skills (vision, proprioception, and the vestibular system) in maintaining their ability to balance as they age. Patients will apply a variety of balancing exercises that are appropriate for their current level of function. Patients will understand the common causes for poor balance, possible solutions to these  problems, and ways to modify their physical environment in order to minimize their fall risk. The purpose of this lesson is to teach patients about the importance of maintaining balance as they age and ways to minimize their risk of falling.  WORKSHOPS   Nutrition:  Fueling a Scientist, research (physical sciences) led group instruction and group discussion with PowerPoint presentation and patient guidebook. To enhance the learning environment the use of posters, models and videos may be added. Patients will review the foundational principles of the [place] and understand what constitutes a serving size in each of the food groups. Patients will also learn Pritikin-friendly foods that are better choices when away from home and review make-ahead meal and snack options. Calorie density will be reviewed and applied to three nutrition priorities: weight maintenance, weight loss, and weight gain. The purpose of this lesson is to reinforce (in a group setting) the key concepts around what patients are recommended to eat and how to apply these guidelines when away from home by planning and selecting Pritikin-friendly options. Patients will understand how calorie density may be adjusted for different weight management goals.  Mindful Eating  Clinical staff led group instruction and group discussion with PowerPoint presentation and patient guidebook. To enhance the learning environment the use of posters, models and videos may be added. Patients will briefly review the concepts of the [place] and the importance of low-calorie dense foods. The concept of mindful eating will be introduced  as well as the importance of paying attention to internal hunger signals. Triggers for non-hunger eating and techniques for dealing with triggers will be explored. The purpose of this lesson is to provide patients with the opportunity to review the basic principles of the [place], discuss the value of  eating mindfully and how to measure internal cues of hunger and fullness using the Hunger Scale. Patients will also discuss reasons for non-hunger eating and learn strategies to use for controlling emotional eating.  Targeting Your Nutrition Priorities Clinical staff led group instruction and group discussion with PowerPoint presentation and patient guidebook. To enhance the learning environment the use of posters, models and videos may be added. Patients will learn how to determine their genetic susceptibility to disease by reviewing their family history. Patients will gain insight into the importance of diet as part of an overall healthy lifestyle in mitigating the impact of genetics and other environmental insults. The purpose of this lesson is to provide patients with the opportunity to assess their personal nutrition priorities by looking at their family history, their own health history and current risk factors. Patients will also be able to discuss ways of prioritizing and modifying the [place] for their highest risk areas  Menu  Clinical staff led group instruction and group discussion with PowerPoint presentation and patient guidebook. To enhance the learning environment the use of posters, models and videos may be added. Using menus brought in from [employer], or printed from [employer], patients will apply the [place] dining out guidelines that were presented in the [employer] video. Patients will also be able to practice these guidelines in a variety of provided scenarios. The purpose of this lesson is to provide patients with the opportunity to practice hands-on learning of the [place] with actual menus and practice scenarios.  Label Reading Clinical staff led group instruction and group discussion with PowerPoint presentation and patient guidebook. To enhance the learning environment the use of posters, models and videos may be  added. Patients will review and discuss the [facility] label reading guidelines presented in [facility]'s Label Reading Educational series video. Using fool labels brought in from local grocery stores and markets, patients will apply the label reading guidelines and determine if the packaged food meet the [facility] guidelines. The purpose of this lesson is to provide patients with the opportunity to review, discuss, and practice hands-on learning of the [facility] Label Reading guidelines with actual packaged food labels. [place] Workshops are designed to teach patients ways to prepare quick, simple, and affordable recipes at home. The importance of nutrition's role in chronic disease risk reduction is reflected in its emphasis in the overall [facility] program. By learning how to prepare essential core [facility] Eating Plan recipes, patients will increase control over what they eat; be able to customize the flavor of foods without the use of added salt, sugar, or fat; and improve the quality of the food they consume. By learning a set of core recipes which are easily assembled, quickly prepared, and affordable, patients are more likely to prepare more healthy foods at home. These workshops focus on convenient breakfasts, simple entres, side dishes, and desserts which can be prepared with minimal effort and are consistent with nutrition recommendations for cardiovascular risk reduction. Cooking [employer] are taught by a Engineer, materials (RD) who has been trained by the [employer]. The chef or RD has a clear understanding  of the importance of minimizing - if not completely eliminating - added fat, sugar, and sodium in recipes. Throughout the series of [place] Workshop sessions, patients will learn about healthy ingredients and efficient methods of cooking to build confidence in their capability to prepare    Cooking School weekly topics:  Adding  Flavor- Sodium-Free  Fast and Healthy Breakfasts  Powerhouse Plant-Based Proteins  Satisfying Salads and Dressings  Simple Sides and Sauces  International Cuisine-Spotlight on the [place] Zones  Delicious Desserts  Savory Soups  Efficiency Cooking - Meals in a Snap  Tasty Appetizers and Snacks  Comforting Weekend Breakfasts  One-Pot Wonders   Fast Evening Meals  [place] (Psychosocial): New Thoughts, New Behaviors Clinical staff led group instruction and group discussion with PowerPoint presentation and patient guidebook. To enhance the learning environment the use of posters, models and videos may be added. Patients will learn and practice techniques for developing effective health and lifestyle goals. Patients will be able to effectively apply the goal setting process learned to develop at least one new personal goal.  The purpose of this lesson is to expose patients to a new skill set of behavior modification techniques such as techniques setting SMART goals, overcoming barriers, and achieving new thoughts and new behaviors.  Managing Moods and Relationships Clinical staff led group instruction and group discussion with PowerPoint presentation and patient guidebook. To enhance the learning environment the use of posters, models and videos may be added. Patients will learn how emotional and chronic stress factors can impact their health and relationships. They will learn healthy ways to manage their moods and utilize positive coping mechanisms. In addition, ICR patients will learn ways to improve communication skills. The purpose of this lesson is to expose patients to ways of understanding how one's mood and health are intimately connected. Developing a healthy outlook can help build positive relationships and connections with others. Patients will understand the importance of utilizing effective communication skills  that include actively listening and being heard. They will learn and understand the importance of the "4 Cs" and especially Connections in fostering of a Healthy Mind-Set.  Healthy Sleep for a Healthy Heart Clinical staff led group instruction and group discussion with PowerPoint presentation and patient guidebook. To enhance the learning environment the use of posters, models and videos may be added. At the conclusion of this workshop, patients will be able to demonstrate knowledge of the importance of sleep to overall health, well-being, and quality of life. They will understand the symptoms of, and treatments for, common sleep disorders. Patients will also be able to identify daytime and nighttime behaviors which impact sleep, and they will be able to apply these tools to help manage sleep-related challenges. The purpose of this lesson is to provide patients with a general overview of sleep and outline the importance of quality sleep. Patients will learn about a few of the most common sleep disorders. Patients will also be introduced to the concept of "sleep hygiene," and discover ways to self-manage certain sleeping problems through simple daily behavior changes. Finally, the workshop will motivate patients by clarifying the links between quality sleep and their goals of heart-healthy living.   Recognizing and Reducing Stress Clinical staff led group instruction and group discussion with PowerPoint presentation and patient guidebook. To enhance the learning environment the use of posters, models and videos may be added. At the conclusion of this workshop, patients will be able to  understand the types of stress reactions, differentiate between acute and chronic stress, and recognize the impact that chronic stress has on their health. They will also be able to apply different coping mechanisms, such as reframing negative self-talk. Patients will have the opportunity to practice a variety of stress management  techniques, such as deep abdominal breathing, progressive muscle relaxation, and/or guided imagery.  The purpose of this lesson is to educate patients on the role of stress in their lives and to provide healthy techniques for coping with it.  Learning Barriers/Preferences:  Learning Barriers/Preferences - 07/17/22 1036       Learning Barriers/Preferences   Learning Barriers Exercise Concerns   Balance concern, dizziness, assistive device, previous falls   Learning Preferences Pictoral;Written Material;Individual Instruction;Group Instruction             Education Topics:  Knowledge Questionnaire Score:  Knowledge Questionnaire Score - 07/17/22 1035       Knowledge Questionnaire Score   Pre Score 20/24             Core Components/Risk Factors/Patient Goals at Admission:  Personal Goals and Risk Factors at Admission - 07/17/22 1037       Core Components/Risk Factors/Patient Goals on Admission    Weight Management Yes;Obesity;Weight Loss    Intervention Weight Management: Develop a combined nutrition and exercise program designed to reach desired caloric intake, while maintaining appropriate intake of nutrient and fiber, sodium and fats, and appropriate energy expenditure required for the weight goal.;Weight Management: Provide education and appropriate resources to help participant work on and attain dietary goals.;Weight Management/Obesity: Establish reasonable short term and long term weight goals.;Obesity: Provide education and appropriate resources to help participant work on and attain dietary goals.    Admit Weight 167 lb 15.9 oz (76.2 kg)    Expected Outcomes Short Term: Continue to assess and modify interventions until short term weight is achieved;Long Term: Adherence to nutrition and physical activity/exercise program aimed toward attainment of established weight goal;Weight Loss: Understanding of general recommendations for a balanced deficit meal plan, which promotes  1-2 lb weight loss per week and includes a negative energy balance of [phone] kcal/d;Understanding recommendations for meals to include 15-35% energy as protein, 25-35% energy from fat, 35-60% energy from carbohydrates, less than '200mg'$  of dietary cholesterol, 20-35 gm of total fiber daily;Understanding of distribution of calorie intake throughout the day with the consumption of 4-5 meals/snacks    Heart Failure Yes    Intervention Provide a combined exercise and nutrition program that is supplemented with education, support and counseling about heart failure. Directed toward relieving symptoms such as shortness of breath, decreased exercise tolerance, and extremity edema.    Expected Outcomes Long term: Adoption of self-care skills and reduction of barriers for early signs and symptoms recognition and intervention leading to self-care maintenance.;Short term: Daily weights obtained and reported for increase. Utilizing diuretic protocols set by physician.;Short term: Attendance in program 2-3 days a week with increased exercise capacity. Reported lower sodium intake. Reported increased fruit and vegetable intake. Reports medication compliance.;Improve functional capacity of life    Hypertension Yes    Intervention Provide education on lifestyle modifcations including regular physical activity/exercise, weight management, moderate sodium restriction and increased consumption of fresh fruit, vegetables, and low fat dairy, alcohol moderation, and smoking cessation.;Monitor prescription use compliance.    Expected Outcomes Short Term: Continued assessment and intervention until BP is < 140/43m HG in hypertensive participants. < 130/88mHG in hypertensive participants with diabetes, heart failure or chronic kidney  disease.;Long Term: Maintenance of blood pressure at goal levels.    Lipids Yes    Intervention Provide education and support for participant on nutrition & aerobic/resistive exercise along with  prescribed medications to achieve LDL '70mg'$ , HDL >'40mg'$ .    Expected Outcomes Short Term: Participant states understanding of desired cholesterol values and is compliant with medications prescribed. Participant is following exercise prescription and nutrition guidelines.;Long Term: Cholesterol controlled with medications as prescribed, with individualized exercise RX and with personalized nutrition plan. Value goals: LDL < '70mg'$ , HDL > 40 mg.    Personal Goal Other Yes    Personal Goal Short term: back to ADL's Long term: strenght and stamina    Intervention Will continue to monitor pt and progress workloads as tolerated without sign or symptom    Expected Outcomes Pt will achieve her goals             Core Components/Risk Factors/Patient Goals Review:   Goals and Risk Factor Review     Row Name 08/05/22 1226 08/26/22 1551           Core Components/Risk Factors/Patient Goals Review   Personal Goals Review Weight Management/Obesity;Heart Failure;Hypertension;Lipids Weight Management/Obesity;Heart Failure;Hypertension;Lipids      Review Nechelle is off to a good start to exercise at intensive cardiac rehab. Vital signs have been stable. Shivani is decondtioned. Will continue to monitor. Girl is doing well with exercise at intensive cardiac rehab for her fitness level. Vital signs have been stable. Sophiya remains deconditioned but is enjoyig participating in the program      Expected Outcomes Xolani will continue to participate in intensive cardiac rehab twice a week for exercise, nutrition and lifestyle modifications Jaida will continue to participate in intensive cardiac rehab twice a week for exercise, nutrition and lifestyle modifications               Core Components/Risk Factors/Patient Goals at Discharge (Final Review):   Goals and Risk Factor Review - 08/26/22 1551       Core Components/Risk Factors/Patient Goals Review   Personal Goals Review Weight Management/Obesity;Heart  Failure;Hypertension;Lipids    Review Jaydn is doing well with exercise at intensive cardiac rehab for her fitness level. Vital signs have been stable. [place] remains deconditioned but is enjoyig participating in the program    Expected Outcomes Hortence will continue to participate in intensive cardiac rehab twice a week for exercise, nutrition and lifestyle modifications             ITP Comments:  ITP Comments     Row Name 07/17/22 0859 08/05/22 1223 08/26/22 1547       ITP Comments Dr Fransico Him MD, Medical Director. Introduction to [facility] Education Program/ Intensive Cardiac Rehab. Initial Orientation Packet Reviewed with the patient 30 Day ITP Review. Ellarae is off to a good start to exercise for her fitness level. 30 Day ITP Review. Charita has good attendance and participation in intensive cardiac rehab.              Comments: See ITP comments.Harrell Gave RN BSN

## 2022-08-29 ENCOUNTER — Encounter (HOSPITAL_COMMUNITY)
Admission: RE | Admit: 2022-08-29 | Discharge: 2022-08-29 | Disposition: A | Discharge status: Home / Self Care | Payer: Medicare HMO | Source: Ambulatory Visit | Attending: Internal Medicine | Admitting: Internal Medicine

## 2022-08-29 DIAGNOSIS — I5022 Chronic systolic (congestive) heart failure: Secondary | ICD-10-CM | POA: Diagnosis not present

## 2022-08-29 DIAGNOSIS — I213 ST elevation (STEMI) myocardial infarction of unspecified site: Secondary | ICD-10-CM | POA: Diagnosis not present

## 2022-09-01 ENCOUNTER — Encounter (HOSPITAL_COMMUNITY)
Admission: RE | Admit: 2022-09-01 | Discharge: 2022-09-01 | Disposition: A | Discharge status: Home / Self Care | Payer: Medicare HMO | Source: Ambulatory Visit | Attending: Internal Medicine | Admitting: Internal Medicine

## 2022-09-01 DIAGNOSIS — I213 ST elevation (STEMI) myocardial infarction of unspecified site: Secondary | ICD-10-CM | POA: Diagnosis not present

## 2022-09-01 DIAGNOSIS — I5022 Chronic systolic (congestive) heart failure: Secondary | ICD-10-CM | POA: Diagnosis not present

## 2022-09-05 ENCOUNTER — Encounter (HOSPITAL_COMMUNITY)
Admission: RE | Admit: 2022-09-05 | Discharge: 2022-09-05 | Disposition: A | Discharge status: Home / Self Care | Payer: Medicare HMO | Source: Ambulatory Visit | Attending: Internal Medicine | Admitting: Internal Medicine

## 2022-09-05 DIAGNOSIS — I5022 Chronic systolic (congestive) heart failure: Secondary | ICD-10-CM | POA: Diagnosis not present

## 2022-09-05 DIAGNOSIS — I213 ST elevation (STEMI) myocardial infarction of unspecified site: Secondary | ICD-10-CM | POA: Diagnosis not present

## 2022-09-08 ENCOUNTER — Encounter (HOSPITAL_COMMUNITY)
Admission: RE | Admit: 2022-09-08 | Discharge: 2022-09-08 | Disposition: A | Discharge status: Home / Self Care | Payer: Medicare HMO | Source: Ambulatory Visit | Attending: Internal Medicine | Admitting: Internal Medicine

## 2022-09-08 DIAGNOSIS — I213 ST elevation (STEMI) myocardial infarction of unspecified site: Secondary | ICD-10-CM

## 2022-09-08 DIAGNOSIS — I5022 Chronic systolic (congestive) heart failure: Secondary | ICD-10-CM | POA: Diagnosis not present

## 2022-09-12 ENCOUNTER — Encounter (HOSPITAL_COMMUNITY): Payer: Medicare HMO

## 2022-09-19 ENCOUNTER — Encounter (HOSPITAL_COMMUNITY)
Admission: RE | Admit: 2022-09-19 | Discharge: 2022-09-19 | Disposition: A | Discharge status: Home / Self Care | Payer: Medicare HMO | Source: Ambulatory Visit | Attending: Internal Medicine | Admitting: Internal Medicine

## 2022-09-19 VITALS — Ht <= 58 in | Wt 172.2 lb

## 2022-09-19 DIAGNOSIS — I213 ST elevation (STEMI) myocardial infarction of unspecified site: Secondary | ICD-10-CM

## 2022-09-19 DIAGNOSIS — I5022 Chronic systolic (congestive) heart failure: Secondary | ICD-10-CM | POA: Insufficient documentation

## 2022-09-19 DIAGNOSIS — I252 Old myocardial infarction: Secondary | ICD-10-CM | POA: Insufficient documentation

## 2022-09-19 DIAGNOSIS — I110 Hypertensive heart disease with heart failure: Secondary | ICD-10-CM | POA: Diagnosis not present

## 2022-09-22 ENCOUNTER — Encounter (HOSPITAL_COMMUNITY)
Admission: RE | Admit: 2022-09-22 | Discharge: 2022-09-22 | Disposition: A | Discharge status: Home / Self Care | Payer: Medicare HMO | Source: Ambulatory Visit | Attending: Internal Medicine | Admitting: Internal Medicine

## 2022-09-22 DIAGNOSIS — I252 Old myocardial infarction: Secondary | ICD-10-CM | POA: Diagnosis not present

## 2022-09-22 DIAGNOSIS — I213 ST elevation (STEMI) myocardial infarction of unspecified site: Secondary | ICD-10-CM

## 2022-09-22 DIAGNOSIS — I5022 Chronic systolic (congestive) heart failure: Secondary | ICD-10-CM | POA: Diagnosis not present

## 2022-09-22 DIAGNOSIS — I110 Hypertensive heart disease with heart failure: Secondary | ICD-10-CM | POA: Diagnosis not present

## 2022-09-23 NOTE — Progress Notes (Signed)
Cardiac Individual Treatment Plan  Patient Details  Name: Amy [name] MRN: [id number] Date of Birth: 10/14/1946 Referring Provider:   Flowsheet Row INTENSIVE CARDIAC REHAB ORIENT from 07/17/2022 in [facility]  Referring Provider Dr. Glori Bickers MD       Initial Encounter Date:  [place] from 07/17/2022 in [facility]  Date 07/17/22       Visit Diagnosis: ST elevation myocardial infarction (STEMI), unspecified artery (HCC)  Heart failure, chronic systolic (HCC)  Patient's Home Medications on Admission:  Current Outpatient Medications:    acetaminophen (TYLENOL) 500 MG tablet, Take 500 mg by mouth every 6 (six) hours as needed for mild pain or headache., Disp: , Rfl:    aspirin 81 MG EC tablet, Take 81 mg by mouth every evening., Disp: , Rfl:    atorvastatin (LIPITOR) 80 MG tablet, Take 1 tablet (80 mg total) by mouth daily., Disp: 30 tablet, Rfl: 0   Carboxymethylcellulose Sodium (DRY EYE RELIEF OP), Place 1 drop into both eyes 4 (four) times daily as needed (dryness)., Disp: , Rfl:    clopidogrel (PLAVIX) 75 MG tablet, Take 1 tablet (75 mg total) by mouth daily., Disp: 31 tablet, Rfl: 2   Coenzyme Q10 (CO Q-10) 200 MG CAPS, Take 200 mg by mouth daily., Disp: , Rfl:    Cyanocobalamin (VITAMIN B 12 PO), Take 1 tablet by mouth daily., Disp: , Rfl:    digoxin (LANOXIN) 0.125 MG tablet, Take 1 tablet (0.125 mg total) by mouth daily., Disp: 31 tablet, Rfl: 2   empagliflozin (JARDIANCE) 10 MG TABS tablet, Take 1 tablet (10 mg total) by mouth daily., Disp: 30 tablet, Rfl: 11   meclizine (ANTIVERT) 25 MG tablet, Take 25 mg by mouth daily., Disp: , Rfl:    metoprolol succinate (TOPROL XL) 25 MG 24 hr tablet, Take 1/2 tab Daily, Disp: 15 tablet, Rfl: 11   Multiple Vitamin (MULTI-VITAMIN) tablet, Take 1 tablet by mouth daily., Disp: , Rfl:    Pramoxine-Dimethicone (GOLD BOND INTENSIVE  HEALING EX), Apply 1 application topically daily as needed (eczema)., Disp: , Rfl:    Prasterone, DHEA, (DHEA PO), Take by mouth., Disp: , Rfl:    torsemide (DEMADEX) 20 MG tablet, Take 10 mg by mouth daily as needed (fluid retention.)., Disp: , Rfl:   Past Medical History: Past Medical History:  Diagnosis Date   CHF (congestive heart failure) (HCC)    Coronary artery disease    GERD (gastroesophageal reflux disease)    Hyperlipidemia    Obesity    Pre-diabetes     Tobacco Use: Social History   Tobacco Use  Smoking Status Never  Smokeless Tobacco Never    Labs: Review Flowsheet  More data exists      Latest Ref Rng & Units 03/01/2022 03/02/2022 03/03/2022 03/04/2022 03/05/2022  Labs for ITP Cardiac and Pulmonary Rehab  O2 Saturation % 72.5  72.6  58.4  62.3  66.1     Capillary Blood Glucose: Lab Results  Component Value Date   GLUCAP 184 (H) 03/05/2022   GLUCAP 96 03/05/2022   GLUCAP 131 (H) 03/05/2022   GLUCAP 153 (H) 03/04/2022   GLUCAP 120 (H) 03/04/2022     Exercise Target Goals: Exercise Program Goal: Individual exercise prescription set using results from initial 6 min walk test and THRR while considering  patient's activity barriers and safety.   Exercise Prescription Goal: Initial exercise prescription builds to 30-45 minutes a day  of aerobic activity, 2-3 days per week.  Home exercise guidelines will be given to patient during program as part of exercise prescription that the participant will acknowledge.  Activity Barriers & Risk Stratification:  Activity Barriers & Cardiac Risk Stratification - 07/17/22 1024       Activity Barriers & Cardiac Risk Stratification   Activity Barriers Neck/Spine Problems;Joint Problems;Balance Concerns;Deconditioning;Muscular Weakness;History of Falls;Left Knee Replacement;Right Knee Replacement    Cardiac Risk Stratification High             6 Minute Walk:  6 Minute Walk     Row Name 07/17/22 1009 09/19/22 1036        6 Minute Walk   Phase Initial  Nustep test: poor balance, dizziness and assistive device Discharge  performed on Nustep    Distance 886 feet 1574 feet  .61 km on Nustep equivalent to 1574 feet    Distance % Change -- 77.65 %    Distance Feet Change -- 688 ft    Walk Time 6 minutes 6 minutes    # of Rest Breaks 0 0    MPH 1.68 3    METS 1.2 1.9    RPE 9 13    Perceived Dyspnea  0 0    VO2 Peak 4.2 8.9    Symptoms No No    Resting HR 93 bpm 81 bpm    Resting BP 108/78 116/68    Resting Oxygen Saturation  95 % --    Exercise Oxygen Saturation  during 6 min walk 94 % --    Max Ex. HR 102 bpm 105 bpm    Max Ex. BP 114/74 132/62    2 Minute Post BP 110/74 --             Oxygen Initial Assessment:   Oxygen Re-Evaluation:   Oxygen Discharge (Final Oxygen Re-Evaluation):   Initial Exercise Prescription:  Initial Exercise Prescription - 07/17/22 1000       Date of Initial Exercise RX and Referring Provider   Date 07/17/22    Referring Provider Dr. Glori Bickers MD    Expected Discharge Date 09/12/22      NuStep   Level 1    SPM 53    Minutes 20    METs 1.3      Prescription Details   Frequency (times per week) 2    Duration Progress to 30 minutes of continuous aerobic without signs/symptoms of physical distress      Intensity   THRR 40-80% of Max Heartrate 58-116    Ratings of Perceived Exertion 11-13    Perceived Dyspnea 0-4      Progression   Progression Continue progressive overload as per policy without signs/symptoms or physical distress.      Resistance Training   Training Prescription Yes    Weight 2    Reps 10-15             Perform Capillary Blood Glucose checks as needed.  Exercise Prescription Changes:   Exercise Prescription Changes     Row Name 07/25/22 1100 08/04/22 1500 08/22/22 1412 09/22/22 1100       Response to Exercise   Blood Pressure (Admit) 110/60 104/65 118/72 108/68    Blood Pressure (Exercise) 108/78 110/70  124/74 110/68    Blood Pressure (Exit) 108/62 110/64 112/70 112/72    Heart Rate (Admit) 76 bpm 89 bpm 79 bpm 89 bpm    Heart Rate (Exercise) 101 bpm 109 bpm 108 bpm 109 bpm  Heart Rate (Exit) 81 bpm 89 bpm 75 bpm 92 bpm    Rating of Perceived Exertion (Exercise) '12 11 12 13    '$ Symptoms None None None None    Comments Pt's first day in the CRP2 program Reviewed METs Reviewed METs, goals and home exercise Rx Reviewed METs and goals    Duration Continue with 30 min of aerobic exercise without signs/symptoms of physical distress. Progress to 30 minutes of  aerobic without signs/symptoms of physical distress Progress to 30 minutes of  aerobic without signs/symptoms of physical distress Progress to 30 minutes of  aerobic without signs/symptoms of physical distress    Intensity THRR unchanged THRR unchanged THRR unchanged THRR unchanged      Progression   Progression Continue to progress workloads to maintain intensity without signs/symptoms of physical distress. Continue to progress workloads to maintain intensity without signs/symptoms of physical distress. Continue to progress workloads to maintain intensity without signs/symptoms of physical distress. Continue to progress workloads to maintain intensity without signs/symptoms of physical distress.    Average METs 1.3 1.5 1.7 1.8      Resistance Training   Training Prescription Yes Yes Yes Yes    Weight 2 lbs 2 lbs 2 lbs 2 lbs    Reps 10-15 10-15 10-15 10-15    Time 10 Minutes 10 Minutes 10 Minutes 10 Minutes      Interval Training   Interval Training No No No No      NuStep   Level '1 1 1 1    '$ SPM 56 -- -- --    Minutes '22 25 30 30    '$ METs 1.3 1.5 107 1.8      Home Exercise Plan   Plans to continue exercise at -- -- Home (comment) Home (comment)    Frequency -- -- Add 2 additional days to program exercise sessions. Add 2 additional days to program exercise sessions.    Initial Home Exercises Provided -- -- 08/22/22 08/22/22              Exercise Comments:   Exercise Comments     Row Name 07/25/22 1107 08/04/22 1547 08/25/22 1418 09/22/22 1109     Exercise Comments Pt's first day in the CRP2 program. Pt voiced no complaints. Reviewed METs. Slow progress in thsi deconitioned patient. Pt attentens the CRP2 program 2x/week. Reviewed METs, goals and home exercise Rx. Progression is slow. Voices impeovement in ADL's. Pt verbalized understanding of the home exercise RX and was provided a copy. Continues to do chair exercises at home. No complaints with todays session.             Exercise Goals and Review:   Exercise Goals     Row Name 07/17/22 1030             Exercise Goals   Increase Physical Activity Yes       Intervention Provide advice, education, support and counseling about physical activity/exercise needs.;Develop an individualized exercise prescription for aerobic and resistive training based on initial evaluation findings, risk stratification, comorbidities and participant's personal goals.       Expected Outcomes Short Term: Attend rehab on a regular basis to increase amount of physical activity.;Long Term: Add in home exercise to make exercise part of routine and to increase amount of physical activity.;Long Term: Exercising regularly at least 3-5 days a week.       Increase Strength and Stamina Yes       Intervention Provide advice, education, support and counseling  about physical activity/exercise needs.;Develop an individualized exercise prescription for aerobic and resistive training based on initial evaluation findings, risk stratification, comorbidities and participant's personal goals.       Expected Outcomes Short Term: Increase workloads from initial exercise prescription for resistance, speed, and METs.;Short Term: Perform resistance training exercises routinely during rehab and add in resistance training at home;Long Term: Improve cardiorespiratory fitness, muscular endurance and strength as  measured by increased METs and functional capacity (6MWT)       Able to understand and use rate of perceived exertion (RPE) scale Yes       Intervention Provide education and explanation on how to use RPE scale       Expected Outcomes Short Term: Able to use RPE daily in rehab to express subjective intensity level;Long Term:  Able to use RPE to guide intensity level when exercising independently       Knowledge and understanding of Target Heart Rate Range (THRR) Yes       Intervention Provide education and explanation of THRR including how the numbers were predicted and where they are located for reference       Expected Outcomes Short Term: Able to state/look up THRR;Long Term: Able to use THRR to govern intensity when exercising independently;Short Term: Able to use daily as guideline for intensity in rehab       Understanding of Exercise Prescription Yes       Intervention Provide education, explanation, and written materials on patient's individual exercise prescription       Expected Outcomes Short Term: Able to explain program exercise prescription;Long Term: Able to explain home exercise prescription to exercise independently                Exercise Goals Re-Evaluation :  Exercise Goals Re-Evaluation     Row Name 07/25/22 1105 08/22/22 1416 09/22/22 1107         Exercise Goal Re-Evaluation   Exercise Goals Review Increase Physical Activity;Increase Strength and Stamina;Able to understand and use rate of perceived exertion (RPE) scale;Knowledge and understanding of Target Heart Rate Range (THRR);Understanding of Exercise Prescription Increase Physical Activity;Increase Strength and Stamina;Able to understand and use rate of perceived exertion (RPE) scale;Knowledge and understanding of Target Heart Rate Range (THRR);Understanding of Exercise Prescription Increase Physical Activity;Increase Strength and Stamina;Able to understand and use rate of perceived exertion (RPE) scale;Knowledge  and understanding of Target Heart Rate Range (THRR);Understanding of Exercise Prescription     Comments Pt's first day in the CRP2 program. Pt understands the exercise Rx, RPE scale and THRR. Reviewed METs, goals and home exercise Rx. Pt voices that she is walking better. She also voices that she has more energy for housework and ADL's. Pt is doing chair exercises for 30 minutes 2-3 x/week. Reviewed METs and goals. Pt continues to voice increased strength and stamina with ADL's. Continues to do her chair exercises about 2 days/week; encouraged 3x/week.     Expected Outcomes Will continue to monitor patient and progress exercise workloads as tolerated. Will continue to monitor patient and progress exercise workloads as tolerated. Will continue to monitor patient and progress exercise workloads as tolerated.              Discharge Exercise Prescription (Final Exercise Prescription Changes):  Exercise Prescription Changes - 09/22/22 1100       Response to Exercise   Blood Pressure (Admit) 108/68    Blood Pressure (Exercise) 110/68    Blood Pressure (Exit) 112/72    Heart Rate (Admit) 89  bpm    Heart Rate (Exercise) 109 bpm    Heart Rate (Exit) 92 bpm    Rating of Perceived Exertion (Exercise) 13    Symptoms None    Comments Reviewed METs and goals    Duration Progress to 30 minutes of  aerobic without signs/symptoms of physical distress    Intensity THRR unchanged      Progression   Progression Continue to progress workloads to maintain intensity without signs/symptoms of physical distress.    Average METs 1.8      Resistance Training   Training Prescription Yes    Weight 2 lbs    Reps 10-15    Time 10 Minutes      Interval Training   Interval Training No      NuStep   Level 1    Minutes 30    METs 1.8      Home Exercise Plan   Plans to continue exercise at Home (comment)    Frequency Add 2 additional days to program exercise sessions.    Initial Home Exercises Provided  08/22/22             Nutrition:  Target Goals: Understanding of nutrition guidelines, daily intake of sodium '1500mg'$ , cholesterol '200mg'$ , calories 30% from fat and 7% or less from saturated fats, daily to have 5 or more servings of fruits and vegetables.  Biometrics:  Pre Biometrics - 07/17/22 1031       Pre Biometrics   Height '4\' 9"'$  (1.448 m)    Weight 76.2 kg    Waist Circumference 42.5 inches    Hip Circumference 42.75 inches    Waist to Hip Ratio 0.99 %    BMI (Calculated) 36.34    Triceps Skinfold 38 mm    % Body Fat 50.2 %    Grip Strength 13 kg    Flexibility --   Did not attempt. mobility issues   Single Leg Stand --   Did not attempt, assistive device, previous falls and balance issues            Post Biometrics - 09/19/22 1041        Post  Biometrics   Height '4\' 9"'$  (1.448 m)    Weight 78.1 kg    Waist Circumference 42.5 inches    Hip Circumference 43 inches    Waist to Hip Ratio 0.99 %    BMI (Calculated) 37.25    Triceps Skinfold 38 mm    % Body Fat 50.7 %    Grip Strength 13 kg    Flexibility --   Not performed   Single Leg Stand --   Not performed            Nutrition Therapy Plan and Nutrition Goals:  Nutrition Therapy & Goals - 09/18/22 0826       Nutrition Therapy   Diet Heart Healthy Diet    Drug/Food Interactions Statins/Certain Fruits      Personal Nutrition Goals   Nutrition Goal Patient to identify strategies for managing cardiovascular risk by attending the [facility] education and nutrition classes weekly    Personal Goal #2 Patient to reduce sodium intake to '1500mg'$  daily.    Personal Goal #3 Patient to identify food sources and limit daily intake of saturated fat, trans fat, sodium, and refined carbohydrates    Comments Goals in progress. Patient continues to report following a heart healthy diet with awareness of foods high in saturated fat and refined carbohydrates.  She is not able  to attend the [facility] cooking classes on  Wednesdays which could be beneficial to understanding strategies for reducing sodium and increasing intake of high fiber/high potassium foods such as fruits and vegetables. Her daughter remains inconsistent in support of heart healthy habits.      Intervention Plan   Intervention Prescribe, educate and counsel regarding individualized specific dietary modifications aiming towards targeted core components such as weight, hypertension, lipid management, diabetes, heart failure and other comorbidities.;Nutrition handout(s) given to patient.    Expected Outcomes Short Term Goal: Understand basic principles of dietary content, such as calories, fat, sodium, cholesterol and nutrients.;Long Term Goal: Adherence to prescribed nutrition plan.             Nutrition Assessments:  MEDIFICTS Score Key: ?70 Need to make dietary changes  40-70 Heart Healthy Diet ? 40 Therapeutic Level Cholesterol Diet    Picture Your Plate Scores: <58 Unhealthy dietary pattern with much room for improvement. 41-50 Dietary pattern unlikely to meet recommendations for good health and room for improvement. 51-60 More healthful dietary pattern, with some room for improvement.  >60 Healthy dietary pattern, although there may be some specific behaviors that could be improved.    Nutrition Goals Re-Evaluation:  Nutrition Goals Re-Evaluation     Row Name 07/25/22 1213 08/18/22 0944 09/18/22 0826         Goals   Current Weight 169 lb 15.6 oz (77.1 kg) 171 lb 11.8 oz (77.9 kg) 167 lb 15.9 oz (76.2 kg)     Comment A1c 6.3, lipids improved- HDL 26. No new labs at this time; most recent labs show A1c in the prediabetic range at 6.3 No new labs at this time.     Expected Outcome Patient reports interest in learning more about food and nutrition. Goals in progress. Saphronia continues to report following a heart healthy diet. Her daughter lives at home with her. She reports reduction of take-out meals since the pandemic. Jajaira  does report mindfulness of saturated fat intake and limiting red meat, sausage, etc. She is trying to do more of her own cooking instead of relying on her daughter who does not always practice heart healty cooking. Weight is up 3.7# since starting with our program. A1c in the prediabetic range and glucose reamins elevated over the last 3 months. She does deny snacking. Reviewed eliminating sugary beverages and encouraged high fiber food intake including fruit and vegetables. Goals in progress. Patient continues to report following a heart healthy diet with awareness of foods high in saturated fat and refined carbohydrates. She is not able to attend the [facility] cooking classes on Wednesdays which could be beneficial to understanding strategies for reducing sodium and increasing intake of high fiber/high potassium foods such as fruits and vegetables. Her daughter remains inconsistent in support of heart healthy habits. She has maintained her weight since starting with our program.              Nutrition Goals Re-Evaluation:  Nutrition Goals Re-Evaluation     [place] Name 07/25/22 1213 08/18/22 0944 09/18/22 0826         Goals   Current Weight 169 lb 15.6 oz (77.1 kg) 171 lb 11.8 oz (77.9 kg) 167 lb 15.9 oz (76.2 kg)     Comment A1c 6.3, lipids improved- HDL 26. No new labs at this time; most recent labs show A1c in the prediabetic range at 6.3 No new labs at this time.     Expected Outcome Patient reports interest in learning more  about food and nutrition. Goals in progress. Austen continues to report following a heart healthy diet. Her daughter lives at home with her. She reports reduction of take-out meals since the pandemic. Emme does report mindfulness of saturated fat intake and limiting red meat, sausage, etc. She is trying to do more of her own cooking instead of relying on her daughter who does not always practice heart healty cooking. Weight is up 3.7# since starting with our program. A1c in  the prediabetic range and glucose reamins elevated over the last 3 months. She does deny snacking. Reviewed eliminating sugary beverages and encouraged high fiber food intake including fruit and vegetables. Goals in progress. Patient continues to report following a heart healthy diet with awareness of foods high in saturated fat and refined carbohydrates. She is not able to attend the [facility] cooking classes on Wednesdays which could be beneficial to understanding strategies for reducing sodium and increasing intake of high fiber/high potassium foods such as fruits and vegetables. Her daughter remains inconsistent in support of heart healthy habits. She has maintained her weight since starting with our program.              Nutrition Goals Discharge (Final Nutrition Goals Re-Evaluation):  Nutrition Goals Re-Evaluation - 09/18/22 0826       Goals   Current Weight 167 lb 15.9 oz (76.2 kg)    Comment No new labs at this time.    Expected Outcome Goals in progress. Patient continues to report following a heart healthy diet with awareness of foods high in saturated fat and refined carbohydrates. She is not able to attend the [facility] cooking classes on Wednesdays which could be beneficial to understanding strategies for reducing sodium and increasing intake of high fiber/high potassium foods such as fruits and vegetables. Her daughter remains inconsistent in support of heart healthy habits. She has maintained her weight since starting with our program.             Psychosocial: Target Goals: Acknowledge presence or absence of significant depression and/or stress, maximize coping skills, provide positive support system. Participant is able to verbalize types and ability to use techniques and skills needed for reducing stress and depression.  Initial Review & Psychosocial Screening:  Initial Psych Review & Screening - 07/17/22 1226       Initial Review   Current issues with None Identified       Family Dynamics   Good Support System? Yes   Feliz lives with her daughter     Barriers   Psychosocial barriers to participate in program There are no identifiable barriers or psychosocial needs.      Screening Interventions   Interventions Encouraged to exercise             Quality of Life Scores:  Quality of Life - 07/17/22 1035       Quality of Life   Select Quality of Life      Quality of Life Scores   Health/Function Pre 25.62 %    Socioeconomic Pre 28.5 %    Psych/Spiritual Pre 29.14 %    Family Pre 24.38 %    GLOBAL Pre 26.79 %            Scores of 19 and below usually indicate a poorer quality of life in these areas.  A difference of  2-3 points is a clinically meaningful difference.  A difference of 2-3 points in the total score of the Quality of Life Index has been associated  with significant improvement in overall quality of life, self-image, physical symptoms, and general health in studies assessing change in quality of life.  PHQ-9: Review Flowsheet       07/17/2022  Depression screen PHQ 2/9  Decreased Interest 0  Down, Depressed, Hopeless 0  PHQ - 2 Score 0   Interpretation of Total Score  Total Score Depression Severity:  1-4 = Minimal depression, 5-9 = Mild depression, 10-14 = Moderate depression, 15-19 = Moderately severe depression, 20-27 = Severe depression   Psychosocial Evaluation and Intervention:  Psychosocial Evaluation - 07/25/22 1402       Psychosocial Evaluation & Interventions   Interventions Encouraged to exercise with the program and follow exercise prescription    Comments There are no stressors currently identified; pt encouraged to exercise per EP prescription.    Expected Outcomes Patient will increase her excercise stamina and ability.    Continue Psychosocial Services  No Follow up required             Psychosocial Re-Evaluation:  Psychosocial Re-Evaluation     [place] Name 08/05/22 1224 08/26/22 1551 09/23/22  1524         Psychosocial Re-Evaluation   Current issues with None Identified None Identified None Identified     Comments -- -- Avigayil will complete intensive cardiac rehab on 09/26/22     Interventions Encouraged to attend Cardiac Rehabilitation for the exercise Encouraged to attend Cardiac Rehabilitation for the exercise Encouraged to attend Cardiac Rehabilitation for the exercise     Continue Psychosocial Services  No Follow up required No Follow up required No Follow up required              Psychosocial Discharge (Final Psychosocial Re-Evaluation):  Psychosocial Re-Evaluation - 09/23/22 1524       Psychosocial Re-Evaluation   Current issues with None Identified    Comments Jaedyn will complete intensive cardiac rehab on 09/26/22    Interventions Encouraged to attend Cardiac Rehabilitation for the exercise    Continue Psychosocial Services  No Follow up required             Vocational Rehabilitation: Provide vocational rehab assistance to qualifying candidates.   Vocational Rehab Evaluation & Intervention:  Vocational Rehab - 07/17/22 1227       Initial Vocational Rehab Evaluation & Intervention   Assessment shows need for Vocational Rehabilitation No   Delitha is retired and does not need vocational rehab at this time            Education: Education Goals: Education classes will be provided on a weekly basis, covering required topics. Participant will state understanding/return demonstration of topics presented.    Education     Row Name 07/25/22 0900     Education   Cardiac Education Topics Pritikin   Select Core Videos     Core Videos   Educator Dietitian   Select Nutrition   Nutrition Other  Label Reading   Instruction Review Code 1- Verbalizes Understanding   Class Start Time [phone]   Class Stop Time 0856   Class Time Calculation (min) 44 min    [place] Name 08/01/22 0900     Education   Cardiac Education Topics Pritikin   Select Workshops      Workshops   Educator Exercise Physiologist   Select Psychosocial   Psychosocial Workshop Managing Moods and Relationships   Instruction Review Code 1- Verbalizes Understanding   Class Start Time 0810   Class Stop Time [phone]   Class Time  Calculation (min) 41 min    [place] Name 08/04/22 0900     Education   Cardiac Education Topics [facility]   Select Workshops     Workshops   Educator Exercise Physiologist   Select Exercise   Exercise Workshop Exercise Basics: Building Your Action Plan   Instruction Review Code 1- Verbalizes Understanding   Class Start Time [phone]   Class Stop Time 0855   Class Time Calculation (min) 43 min    [place] Name 08/08/22 0900     Education   Cardiac Education Topics Pritikin   Academic librarian Exercise Education   Exercise Education Move It!   Instruction Review Code 1- Verbalizes Understanding   Class Start Time 0815   Class Stop Time 0853   Class Time Calculation (min) 38 min    [place] Name 08/11/22 0900     Education   Cardiac Education Topics Pritikin   Select Core Videos     Core Videos   Educator Dietitian   Select Nutrition   Nutrition Nutrition Action Plan   Instruction Review Code 1- Verbalizes Understanding   Class Start Time 0815   Class Stop Time 0852   Class Time Calculation (min) 37 min    [place] Name 08/15/22 0900     Education   Cardiac Education Topics Pritikin   Select Core Videos     Core Videos   Educator Nurse   Select General Education   General Education Hypertension and Heart Disease   Instruction Review Code 1- Verbalizes Understanding   Class Start Time 0815   Class Stop Time 0850   Class Time Calculation (min) 35 min    [place] Name 08/18/22 0900     Education   Cardiac Education Topics Pritikin   Select Core Videos     Core Videos   Educator Dietitian   Select Nutrition   Nutrition Dining Out - Part 1   Instruction Review Code 1- Verbalizes  Understanding   Class Start Time 0815   Class Stop Time 0850   Class Time Calculation (min) 35 min    [place] Name 08/22/22 0900     Education   Cardiac Education Topics Pritikin   Select Workshops     Workshops   Educator Exercise Physiologist   Select Psychosocial   Psychosocial Workshop New Thoughts, New Behaviors   Instruction Review Code 1- Verbalizes Understanding   Class Start Time 0810   Class Stop Time 0848   Class Time Calculation (min) 38 min    [place] Name 08/25/22 0900     Education   Cardiac Education Topics Pritikin   Select Core Videos     Core Videos   Educator Exercise Physiologist   Select Exercise Education   Exercise Education Biomechanial Limitations   Instruction Review Code 1- Verbalizes Understanding   Class Start Time 0815   Class Stop Time 0900   Class Time Calculation (min) 45 min    [place] Name 08/29/22 0900     Education   Cardiac Education Topics Pritikin   Select Core Videos     Core Videos   Educator Exercise Physiologist   Select Psychosocial   Psychosocial How Our Thoughts Can Heal Our Hearts   Instruction Review Code 1- Verbalizes Understanding   Class Start Time 0815   Class Stop Time 1324   Class Time Calculation (min) 39 min    [place] Name 09/01/22 0900  Education   Cardiac Education Topics Pritikin   IT sales professional Nutrition   Nutrition Workshop Fueling a Designer, multimedia   Instruction Review Code 1- Verbalizes Understanding   Class Start Time [phone]   Class Stop Time 0854   Class Time Calculation (min) 42 min    [place] Name 09/05/22 1200     Education   Cardiac Education Topics Pritikin   Tax inspector General Education   General Education Heart Disease Risk Reduction   Instruction Review Code 1- Verbalizes Understanding   Class Start Time 0808   Class Stop Time 0850   Class Time Calculation (min) 42 min    [place] Name 09/08/22  0900     Education   Cardiac Education Topics Pritikin   Select Workshops     Workshops   Educator Exercise Physiologist   Select Psychosocial   Psychosocial Workshop Recognizing and Reducing Stress   Instruction Review Code 1- Verbalizes Understanding   Class Start Time 0810   Class Stop Time 0854   Class Time Calculation (min) 44 min    [place] Name 09/19/22 0900     Education   Cardiac Education Topics Pritikin   Select Core Videos     Core Videos   Educator Exercise Physiologist   Select Psychosocial   Psychosocial Healthy Minds, Bodies, Hearts   Instruction Review Code 1- Verbalizes Understanding   Class Start Time [phone]   Class Stop Time 0845   Class Time Calculation (min) 35 min    [place] Name 09/22/22 0900     Education   Cardiac Education Topics Pritikin   IT sales professional Nutrition   Nutrition Workshop Label Reading   Instruction Review Code 1- Verbalizes Understanding   Class Start Time 0813   Class Stop Time 0904   Class Time Calculation (min) 51 min            Core Videos: Exercise    Move It!  Clinical staff conducted group or individual video education with verbal and written material and guidebook.  Patient learns the recommended Pritikin exercise program. Exercise with the goal of living a long, healthy life. Some of the health benefits of exercise include controlled diabetes, healthier blood pressure levels, improved cholesterol levels, improved heart and lung capacity, improved sleep, and better body composition. Everyone should speak with their doctor before starting or changing an exercise routine.  Biomechanical Limitations Clinical staff conducted group or individual video education with verbal and written material and guidebook.  Patient learns how biomechanical limitations can impact exercise and how we can mitigate and possibly overcome limitations to have an impactful and balanced exercise  routine.  Body Composition Clinical staff conducted group or individual video education with verbal and written material and guidebook.  Patient learns that body composition (ratio of muscle mass to fat mass) is a key component to assessing overall fitness, rather than body weight alone. Increased fat mass, especially visceral belly fat, can put [place] at increased risk for metabolic syndrome, type 2 diabetes, heart disease, and even death. It is recommended to combine diet and exercise (cardiovascular and resistance training) to improve your body composition. Seek guidance from your physician and exercise physiologist before implementing an exercise routine.  Exercise Action Plan Clinical staff conducted group or individual video education with verbal  and written material and guidebook.  Patient learns the recommended strategies to achieve and enjoy long-term exercise adherence, including variety, self-motivation, self-efficacy, and positive decision making. Benefits of exercise include fitness, good health, weight management, more energy, better sleep, less stress, and overall well-being.  Medical   Heart Disease Risk Reduction Clinical staff conducted group or individual video education with verbal and written material and guidebook.  Patient learns our heart is our most vital organ as it circulates oxygen, nutrients, white blood cells, and hormones throughout the entire body, and carries waste away. Data supports a plant-based eating plan like the Pritikin Program for its effectiveness in slowing progression of and reversing heart disease. The video provides a number of recommendations to address heart disease.   Metabolic Syndrome and Belly Fat  Clinical staff conducted group or individual video education with verbal and written material and guidebook.  Patient learns what metabolic syndrome is, how it leads to heart disease, and how one can reverse it and keep it from coming back. You have  metabolic syndrome if you have 3 of the following 5 criteria: abdominal obesity, high blood pressure, high triglycerides, low HDL cholesterol, and high blood sugar.  Hypertension and Heart Disease Clinical staff conducted group or individual video education with verbal and written material and guidebook.  Patient learns that high blood pressure, or hypertension, is very common in the [place]. Hypertension is largely due to excessive salt intake, but other important risk factors include being overweight, physical inactivity, drinking too much alcohol, smoking, and not eating enough potassium from fruits and vegetables. High blood pressure is a leading risk factor for heart attack, stroke, congestive heart failure, dementia, kidney failure, and premature death. Long-term effects of excessive salt intake include stiffening of the arteries and thickening of heart muscle and organ damage. Recommendations include ways to reduce hypertension and the risk of heart disease.  Diseases of Our Time - Focusing on Diabetes Clinical staff conducted group or individual video education with verbal and written material and guidebook.  Patient learns why the best way to stop diseases of our time is prevention, through food and other lifestyle changes. Medicine (such as prescription pills and surgeries) is often only a Band-Aid on the problem, not a long-term solution. Most common diseases of our time include obesity, type 2 diabetes, hypertension, heart disease, and cancer. The Pritikin Program is recommended and has been proven to help reduce, reverse, and/or prevent the damaging effects of metabolic syndrome.  Nutrition   Overview of the Pritikin Eating Plan  Clinical staff conducted group or individual video education with verbal and written material and guidebook.  Patient learns about the [place] for disease risk reduction. The [place] emphasizes a wide variety of unrefined,  minimally-processed carbohydrates, like fruits, vegetables, whole grains, and legumes. Go, Caution, and Stop food choices are explained. Plant-based and lean animal proteins are emphasized. Rationale provided for low sodium intake for blood pressure control, low added sugars for blood sugar stabilization, and low added fats and oils for coronary artery disease risk reduction and weight management.  Calorie Density  Clinical staff conducted group or individual video education with verbal and written material and guidebook.  Patient learns about calorie density and how it impacts the Pritikin Eating Plan. Knowing the characteristics of the food you choose will help you decide whether those foods will lead to weight gain or weight loss, and whether you want to consume more or less of them. Weight loss is usually  a side effect of the [place] because of its focus on low calorie-dense foods.  Label Reading  Clinical staff conducted group or individual video education with verbal and written material and guidebook.  Patient learns about the Pritikin recommended label reading guidelines and corresponding recommendations regarding calorie density, added sugars, sodium content, and whole grains.  Dining Out - Part 1  Clinical staff conducted group or individual video education with verbal and written material and guidebook.  Patient learns that restaurant meals can be sabotaging because they can be so high in calories, fat, sodium, and/or sugar. Patient learns recommended strategies on how to positively address this and avoid unhealthy pitfalls.  Facts on Fats  Clinical staff conducted group or individual video education with verbal and written material and guidebook.  Patient learns that lifestyle modifications can be just as effective, if not more so, as many medications for lowering your risk of heart disease. A Pritikin lifestyle can help to reduce your risk of inflammation and atherosclerosis  (cholesterol build-up, or plaque, in the artery walls). Lifestyle interventions such as dietary choices and physical activity address the cause of atherosclerosis. A review of the types of fats and their impact on blood cholesterol levels, along with dietary recommendations to reduce fat intake is also included.  Nutrition Action Plan  Clinical staff conducted group or individual video education with verbal and written material and guidebook.  Patient learns how to incorporate Pritikin recommendations into their lifestyle. Recommendations include planning and keeping personal health goals in mind as an important part of their success.  Healthy Mind-Set    Healthy Minds, Bodies, Hearts  Clinical staff conducted group or individual video education with verbal and written material and guidebook.  Patient learns how to identify when they are stressed. Video will discuss the impact of that stress, as well as the many benefits of stress management. Patient will also be introduced to stress management techniques. The way we think, act, and feel has an impact on our hearts.  How Our Thoughts Can Heal Our Hearts  Clinical staff conducted group or individual video education with verbal and written material and guidebook.  Patient learns that negative thoughts can cause depression and anxiety. This can result in negative lifestyle behavior and serious health problems. Cognitive behavioral therapy is an effective method to help control our thoughts in order to change and improve our emotional outlook.  Additional Videos:  Exercise    Improving Performance  Clinical staff conducted group or individual video education with verbal and written material and guidebook.  Patient learns to use a non-linear approach by alternating intensity levels and lengths of time spent exercising to help burn more calories and lose more body fat. Cardiovascular exercise helps improve heart health, metabolism, hormonal balance,  blood sugar control, and recovery from fatigue. Resistance training improves strength, endurance, balance, coordination, reaction time, metabolism, and muscle mass. Flexibility exercise improves circulation, posture, and balance. Seek guidance from your physician and exercise physiologist before implementing an exercise routine and learn your capabilities and proper form for all exercise.  Introduction to Yoga  Clinical staff conducted group or individual video education with verbal and written material and guidebook.  Patient learns about yoga, a discipline of the coming together of mind, breath, and body. The benefits of yoga include improved flexibility, improved range of motion, better posture and core strength, increased lung function, weight loss, and positive self-image. Yoga's heart health benefits include lowered blood pressure, healthier heart rate, decreased cholesterol and triglyceride  levels, improved immune function, and reduced stress. Seek guidance from your physician and exercise physiologist before implementing an exercise routine and learn your capabilities and proper form for all exercise.  Medical   Aging: Enhancing Your Quality of Life  Clinical staff conducted group or individual video education with verbal and written material and guidebook.  Patient learns key strategies and recommendations to stay in good physical health and enhance quality of life, such as prevention strategies, having an advocate, securing a [place], and keeping a list of medications and system for tracking them. It also discusses how to avoid risk for bone loss.  Biology of Weight Control  Clinical staff conducted group or individual video education with verbal and written material and guidebook.  Patient learns that weight gain occurs because we consume more calories than we burn (eating more, moving less). Even if your body weight is normal, you may have higher ratios of fat  compared to muscle mass. Too much body fat puts you at increased risk for cardiovascular disease, heart attack, stroke, type 2 diabetes, and obesity-related cancers. In addition to exercise, following the [place] can help reduce your risk.  Decoding Lab Results  Clinical staff conducted group or individual video education with verbal and written material and guidebook.  Patient learns that lab test reflects one measurement whose values change over time and are influenced by many factors, including medication, stress, sleep, exercise, food, hydration, pre-existing medical conditions, and more. It is recommended to use the knowledge from this video to become more involved with your lab results and evaluate your numbers to speak with your doctor.   Diseases of Our Time - Overview  Clinical staff conducted group or individual video education with verbal and written material and guidebook.  Patient learns that according to the CDC, 50% to 70% of chronic diseases (such as obesity, type 2 diabetes, elevated lipids, hypertension, and heart disease) are avoidable through lifestyle improvements including healthier food choices, listening to satiety cues, and increased physical activity.  Sleep Disorders Clinical staff conducted group or individual video education with verbal and written material and guidebook.  Patient learns how good quality and duration of sleep are important to overall health and well-being. Patient also learns about sleep disorders and how they impact health along with recommendations to address them, including discussing with a physician.  Nutrition  Dining Out - Part 2 Clinical staff conducted group or individual video education with verbal and written material and guidebook.  Patient learns how to plan ahead and communicate in order to maximize their dining experience in a healthy and nutritious manner. Included are recommended food choices based on the type of restaurant  the patient is visiting.   Fueling a Best boy conducted group or individual video education with verbal and written material and guidebook.  There is a strong connection between our food choices and our health. Diseases like obesity and type 2 diabetes are very prevalent and are in large-part due to lifestyle choices. The Pritikin Eating Plan provides plenty of food and hunger-curbing satisfaction. It is easy to follow, affordable, and helps reduce health risks.  Menu Workshop  Clinical staff conducted group or individual video education with verbal and written material and guidebook.  Patient learns that restaurant meals can sabotage health goals because they are often packed with calories, fat, sodium, and sugar. Recommendations include strategies to plan ahead and to communicate with the manager, chef, or server to  help order a healthier meal.  Planning Your Eating Strategy  Clinical staff conducted group or individual video education with verbal and written material and guidebook.  Patient learns about the [place] and its benefit of reducing the risk of disease. The [place] does not focus on calories. Instead, it emphasizes high-quality, nutrient-rich foods. By knowing the characteristics of the foods, we choose, we can determine their calorie density and make informed decisions.  Targeting Your Nutrition Priorities  Clinical staff conducted group or individual video education with verbal and written material and guidebook.  Patient learns that lifestyle habits have a tremendous impact on disease risk and progression. This video provides eating and physical activity recommendations based on your personal health goals, such as reducing LDL cholesterol, losing weight, preventing or controlling type 2 diabetes, and reducing high blood pressure.  Vitamins and Minerals  Clinical staff conducted group or individual video education with verbal and written  material and guidebook.  Patient learns different ways to obtain key vitamins and minerals, including through a recommended healthy diet. It is important to discuss all supplements you take with your doctor.   Healthy Mind-Set    Smoking Cessation  Clinical staff conducted group or individual video education with verbal and written material and guidebook.  Patient learns that cigarette smoking and tobacco addiction pose a serious health risk which affects millions of people. Stopping smoking will significantly reduce the risk of heart disease, lung disease, and many forms of cancer. Recommended strategies for quitting are covered, including working with your doctor to develop a successful plan.  Culinary   Becoming a Financial trader conducted group or individual video education with verbal and written material and guidebook.  Patient learns that cooking at home can be healthy, cost-effective, quick, and puts them in control. Keys to cooking healthy recipes will include looking at your recipe, assessing your equipment needs, planning ahead, making it simple, choosing cost-effective seasonal ingredients, and limiting the use of added fats, salts, and sugars.  Cooking - Breakfast and Snacks  Clinical staff conducted group or individual video education with verbal and written material and guidebook.  Patient learns how important breakfast is to satiety and nutrition through the entire day. Recommendations include key foods to eat during breakfast to help stabilize blood sugar levels and to prevent overeating at meals later in the day. Planning ahead is also a key component.  Cooking - Human resources officer conducted group or individual video education with verbal and written material and guidebook.  Patient learns eating strategies to improve overall health, including an approach to cook more at home. Recommendations include thinking of animal protein as a side on your plate  rather than center stage and focusing instead on lower calorie dense options like vegetables, fruits, whole grains, and plant-based proteins, such as beans. Making sauces in large quantities to freeze for later and leaving the skin on your vegetables are also recommended to maximize your experience.  Cooking - Healthy Salads and Dressing Clinical staff conducted group or individual video education with verbal and written material and guidebook.  Patient learns that vegetables, fruits, whole grains, and legumes are the foundations of the [place]. Recommendations include how to incorporate each of these in flavorful and healthy salads, and how to create homemade salad dressings. Proper handling of ingredients is also covered. Cooking - Soups and [employer] - Soups and Desserts Clinical staff conducted group or individual video education with  verbal and written material and guidebook.  Patient learns that Pritikin soups and desserts make for easy, nutritious, and delicious snacks and meal components that are low in sodium, fat, sugar, and calorie density, while high in vitamins, minerals, and filling fiber. Recommendations include simple and healthy ideas for soups and desserts.   Overview     The Pritikin Solution Program Overview Clinical staff conducted group or individual video education with verbal and written material and guidebook.  Patient learns that the results of the [place] Program have been documented in more than 100 articles published in peer-reviewed journals, and the benefits include reducing risk factors for (and, in some cases, even reversing) high cholesterol, high blood pressure, type 2 diabetes, obesity, and more! An overview of the three key pillars of the Pritikin Program will be covered: eating well, doing regular exercise, and having a healthy mind-set.  WORKSHOPS  Exercise: Exercise Basics: Building Your Action Plan Clinical staff led group instruction  and group discussion with PowerPoint presentation and patient guidebook. To enhance the learning environment the use of posters, models and videos may be added. At the conclusion of this workshop, patients will comprehend the difference between physical activity and exercise, as well as the benefits of incorporating both, into their routine. Patients will understand the FITT (Frequency, Intensity, Time, and Type) principle and how to use it to build an exercise action plan. In addition, safety concerns and other considerations for exercise and cardiac rehab will be addressed by the presenter. The purpose of this lesson is to promote a comprehensive and effective weekly exercise routine in order to improve patients' overall level of fitness.   Managing Heart Disease: Your Path to a Healthier Heart Clinical staff led group instruction and group discussion with PowerPoint presentation and patient guidebook. To enhance the learning environment the use of posters, models and videos may be added.At the conclusion of this workshop, patients will understand the anatomy and physiology of the heart. Additionally, they will understand how Pritikin's three pillars impact the risk factors, the progression, and the management of heart disease.  The purpose of this lesson is to provide a high-level overview of the heart, heart disease, and how the Pritikin lifestyle positively impacts risk factors.  Exercise Biomechanics Clinical staff led group instruction and group discussion with PowerPoint presentation and patient guidebook. To enhance the learning environment the use of posters, models and videos may be added. Patients will learn how the structural parts of their bodies function and how these functions impact their daily activities, movement, and exercise. Patients will learn how to promote a neutral spine, learn how to manage pain, and identify ways to improve their physical movement in order to  promote healthy living. The purpose of this lesson is to expose patients to common physical limitations that impact physical activity. Participants will learn practical ways to adapt and manage aches and pains, and to minimize their effect on regular exercise. Patients will learn how to maintain good posture while sitting, walking, and lifting.  Balance Training and Fall Prevention  Clinical staff led group instruction and group discussion with PowerPoint presentation and patient guidebook. To enhance the learning environment the use of posters, models and videos may be added. At the conclusion of this workshop, patients will understand the importance of their sensorimotor skills (vision, proprioception, and the vestibular system) in maintaining their ability to balance as they age. Patients will apply a variety of balancing exercises that are appropriate for their current level of function. Patients will  understand the common causes for poor balance, possible solutions to these problems, and ways to modify their physical environment in order to minimize their fall risk. The purpose of this lesson is to teach patients about the importance of maintaining balance as they age and ways to minimize their risk of falling.  WORKSHOPS   Nutrition:  Fueling a Scientist, research (physical sciences) led group instruction and group discussion with PowerPoint presentation and patient guidebook. To enhance the learning environment the use of posters, models and videos may be added. Patients will review the foundational principles of the [place] and understand what constitutes a serving size in each of the food groups. Patients will also learn Pritikin-friendly foods that are better choices when away from home and review make-ahead meal and snack options. Calorie density will be reviewed and applied to three nutrition priorities: weight maintenance, weight loss, and weight gain. The purpose of this lesson is to  reinforce (in a group setting) the key concepts around what patients are recommended to eat and how to apply these guidelines when away from home by planning and selecting Pritikin-friendly options. Patients will understand how calorie density may be adjusted for different weight management goals.  Mindful Eating  Clinical staff led group instruction and group discussion with PowerPoint presentation and patient guidebook. To enhance the learning environment the use of posters, models and videos may be added. Patients will briefly review the concepts of the [place] and the importance of low-calorie dense foods. The concept of mindful eating will be introduced as well as the importance of paying attention to internal hunger signals. Triggers for non-hunger eating and techniques for dealing with triggers will be explored. The purpose of this lesson is to provide patients with the opportunity to review the basic principles of the [place], discuss the value of eating mindfully and how to measure internal cues of hunger and fullness using the Hunger Scale. Patients will also discuss reasons for non-hunger eating and learn strategies to use for controlling emotional eating.  Targeting Your Nutrition Priorities Clinical staff led group instruction and group discussion with PowerPoint presentation and patient guidebook. To enhance the learning environment the use of posters, models and videos may be added. Patients will learn how to determine their genetic susceptibility to disease by reviewing their family history. Patients will gain insight into the importance of diet as part of an overall healthy lifestyle in mitigating the impact of genetics and other environmental insults. The purpose of this lesson is to provide patients with the opportunity to assess their personal nutrition priorities by looking at their family history, their own health history and current risk factors. Patients will  also be able to discuss ways of prioritizing and modifying the [place] for their highest risk areas  Menu  Clinical staff led group instruction and group discussion with PowerPoint presentation and patient guidebook. To enhance the learning environment the use of posters, models and videos may be added. Using menus brought in from [employer], or printed from [employer], patients will apply the [place] dining out guidelines that were presented in the [employer] video. Patients will also be able to practice these guidelines in a variety of provided scenarios. The purpose of this lesson is to provide patients with the opportunity to practice hands-on learning of the [place] with actual menus and practice scenarios.  Label Reading Clinical staff led group instruction and group discussion with PowerPoint presentation and  patient guidebook. To enhance the learning environment the use of posters, models and videos may be added. Patients will review and discuss the Pritikin label reading guidelines presented in Pritikin's Label Reading Educational series video. Using fool labels brought in from local grocery stores and markets, patients will apply the label reading guidelines and determine if the packaged food meet the Pritikin guidelines. The purpose of this lesson is to provide patients with the opportunity to review, discuss, and practice hands-on learning of the Pritikin Label Reading guidelines with actual packaged food labels. [place] Workshops are designed to teach patients ways to prepare quick, simple, and affordable recipes at home. The importance of nutrition's role in chronic disease risk reduction is reflected in its emphasis in the overall Pritikin program. By learning how to prepare essential core Pritikin Eating Plan recipes, patients will increase control over what they eat; be able to customize the  flavor of foods without the use of added salt, sugar, or fat; and improve the quality of the food they consume. By learning a set of core recipes which are easily assembled, quickly prepared, and affordable, patients are more likely to prepare more healthy foods at home. These workshops focus on convenient breakfasts, simple entres, side dishes, and desserts which can be prepared with minimal effort and are consistent with nutrition recommendations for cardiovascular risk reduction. Cooking [employer] are taught by a Engineer, materials (RD) who has been trained by the [employer]. The chef or RD has a clear understanding of the importance of minimizing - if not completely eliminating - added fat, sugar, and sodium in recipes. Throughout the series of [place] Workshop sessions, patients will learn about healthy ingredients and efficient methods of cooking to build confidence in their capability to prepare    Cooking School weekly topics:  Adding Flavor- Sodium-Free  Fast and Healthy Breakfasts  Powerhouse Plant-Based Proteins  Satisfying Salads and Dressings  Simple Sides and Sauces  International Cuisine-Spotlight on the [place] Zones  Delicious Desserts  Savory Soups  Efficiency Cooking - Meals in a Snap  Tasty Appetizers and Snacks  Comforting Weekend Breakfasts  One-Pot Wonders   Fast Evening Meals  Easy [place] (Psychosocial): New Thoughts, New Behaviors Clinical staff led group instruction and group discussion with PowerPoint presentation and patient guidebook. To enhance the learning environment the use of posters, models and videos may be added. Patients will learn and practice techniques for developing effective health and lifestyle goals. Patients will be able to effectively apply the goal setting process learned to develop at least one new personal goal.  The purpose of this  lesson is to expose patients to a new skill set of behavior modification techniques such as techniques setting SMART goals, overcoming barriers, and achieving new thoughts and new behaviors.  Managing Moods and Relationships Clinical staff led group instruction and group discussion with PowerPoint presentation and patient guidebook. To enhance the learning environment the use of posters, models and videos may be added. Patients will learn how emotional and chronic stress factors can impact their health and relationships. They will learn healthy ways to manage their moods and utilize positive coping mechanisms. In addition, ICR patients will learn ways to improve communication skills. The purpose of this lesson is to expose patients to ways of understanding how one's mood and health are intimately connected. Developing a healthy outlook can help build positive relationships and  connections with others. Patients will understand the importance of utilizing effective communication skills that include actively listening and being heard. They will learn and understand the importance of the "4 Cs" and especially Connections in fostering of a Healthy Mind-Set.  Healthy Sleep for a Healthy Heart Clinical staff led group instruction and group discussion with PowerPoint presentation and patient guidebook. To enhance the learning environment the use of posters, models and videos may be added. At the conclusion of this workshop, patients will be able to demonstrate knowledge of the importance of sleep to overall health, well-being, and quality of life. They will understand the symptoms of, and treatments for, common sleep disorders. Patients will also be able to identify daytime and nighttime behaviors which impact sleep, and they will be able to apply these tools to help manage sleep-related challenges. The purpose of this lesson is to provide patients with a general overview of sleep and outline the importance of quality  sleep. Patients will learn about a few of the most common sleep disorders. Patients will also be introduced to the concept of "sleep hygiene," and discover ways to self-manage certain sleeping problems through simple daily behavior changes. Finally, the workshop will motivate patients by clarifying the links between quality sleep and their goals of heart-healthy living.   Recognizing and Reducing Stress Clinical staff led group instruction and group discussion with PowerPoint presentation and patient guidebook. To enhance the learning environment the use of posters, models and videos may be added. At the conclusion of this workshop, patients will be able to understand the types of stress reactions, differentiate between acute and chronic stress, and recognize the impact that chronic stress has on their health. They will also be able to apply different coping mechanisms, such as reframing negative self-talk. Patients will have the opportunity to practice a variety of stress management techniques, such as deep abdominal breathing, progressive muscle relaxation, and/or guided imagery.  The purpose of this lesson is to educate patients on the role of stress in their lives and to provide healthy techniques for coping with it.  Learning Barriers/Preferences:  Learning Barriers/Preferences - 07/17/22 1036       Learning Barriers/Preferences   Learning Barriers Exercise Concerns   Balance concern, dizziness, assistive device, previous falls   Learning Preferences Pictoral;Written Material;Individual Instruction;Group Instruction             Education Topics:  Knowledge Questionnaire Score:  Knowledge Questionnaire Score - 07/17/22 1035       Knowledge Questionnaire Score   Pre Score 20/24             Core Components/Risk Factors/Patient Goals at Admission:  Personal Goals and Risk Factors at Admission - 07/17/22 1037       Core Components/Risk Factors/Patient Goals on Admission     Weight Management Yes;Obesity;Weight Loss    Intervention Weight Management: Develop a combined nutrition and exercise program designed to reach desired caloric intake, while maintaining appropriate intake of nutrient and fiber, sodium and fats, and appropriate energy expenditure required for the weight goal.;Weight Management: Provide education and appropriate resources to help participant work on and attain dietary goals.;Weight Management/Obesity: Establish reasonable short term and long term weight goals.;Obesity: Provide education and appropriate resources to help participant work on and attain dietary goals.    Admit Weight 167 lb 15.9 oz (76.2 kg)    Expected Outcomes Short Term: Continue to assess and modify interventions until short term weight is achieved;Long Term: Adherence to nutrition and physical activity/exercise program aimed  toward attainment of established weight goal;Weight Loss: Understanding of general recommendations for a balanced deficit meal plan, which promotes 1-2 lb weight loss per week and includes a negative energy balance of [phone] kcal/d;Understanding recommendations for meals to include 15-35% energy as protein, 25-35% energy from fat, 35-60% energy from carbohydrates, less than '200mg'$  of dietary cholesterol, 20-35 gm of total fiber daily;Understanding of distribution of calorie intake throughout the day with the consumption of 4-5 meals/snacks    Heart Failure Yes    Intervention Provide a combined exercise and nutrition program that is supplemented with education, support and counseling about heart failure. Directed toward relieving symptoms such as shortness of breath, decreased exercise tolerance, and extremity edema.    Expected Outcomes Long term: Adoption of self-care skills and reduction of barriers for early signs and symptoms recognition and intervention leading to self-care maintenance.;Short term: Daily weights obtained and reported for increase. Utilizing  diuretic protocols set by physician.;Short term: Attendance in program 2-3 days a week with increased exercise capacity. Reported lower sodium intake. Reported increased fruit and vegetable intake. Reports medication compliance.;Improve functional capacity of life    Hypertension Yes    Intervention Provide education on lifestyle modifcations including regular physical activity/exercise, weight management, moderate sodium restriction and increased consumption of fresh fruit, vegetables, and low fat dairy, alcohol moderation, and smoking cessation.;Monitor prescription use compliance.    Expected Outcomes Short Term: Continued assessment and intervention until BP is < 140/51m HG in hypertensive participants. < 130/836mHG in hypertensive participants with diabetes, heart failure or chronic kidney disease.;Long Term: Maintenance of blood pressure at goal levels.    Lipids Yes    Intervention Provide education and support for participant on nutrition & aerobic/resistive exercise along with prescribed medications to achieve LDL '70mg'$ , HDL >'40mg'$ .    Expected Outcomes Short Term: Participant states understanding of desired cholesterol values and is compliant with medications prescribed. Participant is following exercise prescription and nutrition guidelines.;Long Term: Cholesterol controlled with medications as prescribed, with individualized exercise RX and with personalized nutrition plan. Value goals: LDL < '70mg'$ , HDL > 40 mg.    Personal Goal Other Yes    Personal Goal Short term: back to ADL's Long term: strenght and stamina    Intervention Will continue to monitor pt and progress workloads as tolerated without sign or symptom    Expected Outcomes Pt will achieve her goals             Core Components/Risk Factors/Patient Goals Review:   Goals and Risk Factor Review     Row Name 08/05/22 1226 08/26/22 1551 09/23/22 1524         Core Components/Risk Factors/Patient Goals Review   Personal  Goals Review Weight Management/Obesity;Heart Failure;Hypertension;Lipids Weight Management/Obesity;Heart Failure;Hypertension;Lipids Weight Management/Obesity;Heart Failure;Hypertension;Lipids     Review BrVikas off to a good start to exercise at intensive cardiac rehab. Vital signs have been stable. BrImuniques decondtioned. Will continue to monitor. BrBreezies doing well with exercise at intensive cardiac rehab for her fitness level. Vital signs have been stable. BrJanyraemains deconditioned but is enjoyig participating in the program BrPattontinues to do well with exercise at intensive cardiac rehab for her fitness level. Vital signs have been stable. BrFiaemains deconditioned but is enjoying participating in the program. BrGiabellaill complete intensive cardiac rehab on 09/1022.     Expected Outcomes BrKenslyill continue to participate in intensive cardiac rehab twice a week for exercise, nutrition and lifestyle modifications BrLottieill continue to participate in intensive  cardiac rehab twice a week for exercise, nutrition and lifestyle modifications Prosperity will continue to participate in intensive cardiac rehab twice a week for exercise, nutrition and lifestyle modifications              Core Components/Risk Factors/Patient Goals at Discharge (Final Review):   Goals and Risk Factor Review - 09/23/22 1524       Core Components/Risk Factors/Patient Goals Review   Personal Goals Review Weight Management/Obesity;Heart Failure;Hypertension;Lipids    Review Kyann continues to do well with exercise at intensive cardiac rehab for her fitness level. Vital signs have been stable. Tyrina remains deconditioned but is enjoying participating in the program. Tiah will complete intensive cardiac rehab on 09/1022.    Expected Outcomes Wen will continue to participate in intensive cardiac rehab twice a week for exercise, nutrition and lifestyle modifications             ITP Comments:  ITP  Comments     Row Name 07/17/22 0859 08/05/22 1223 08/26/22 1547 09/23/22 1526     ITP Comments Dr Fransico Him MD, Medical Director. Introduction to Pritikin Education Program/ Intensive Cardiac Rehab. Initial Orientation Packet Reviewed with the patient 30 Day ITP Review. Skyla is off to a good start to exercise for her fitness level. 30 Day ITP Review. Gael has good attendance and participation in intensive cardiac rehab. 30 Day ITP Review. Nellene has good attendance and participation in intensive cardiac rehab. Dimonique will complete intensive cardiac rehab on 09/26/22             Comments: See ITP comments.Harrell Gave RN BSN

## 2022-09-25 ENCOUNTER — Other Ambulatory Visit (HOSPITAL_COMMUNITY): Payer: Self-pay

## 2022-09-25 DIAGNOSIS — I5041 Acute combined systolic (congestive) and diastolic (congestive) heart failure: Secondary | ICD-10-CM | POA: Diagnosis not present

## 2022-09-25 DIAGNOSIS — I2510 Atherosclerotic heart disease of native coronary artery without angina pectoris: Secondary | ICD-10-CM | POA: Diagnosis not present

## 2022-09-25 DIAGNOSIS — E119 Type 2 diabetes mellitus without complications: Secondary | ICD-10-CM | POA: Diagnosis not present

## 2022-09-25 DIAGNOSIS — E7800 Pure hypercholesterolemia, unspecified: Secondary | ICD-10-CM | POA: Diagnosis not present

## 2022-09-25 MED ORDER — DIGOXIN 125 MCG PO TABS: 0.1250 mg | ORAL_TABLET | Freq: Every day | ORAL | 11 refills | Status: DC

## 2022-09-26 ENCOUNTER — Encounter (HOSPITAL_COMMUNITY)
Admission: RE | Admit: 2022-09-26 | Discharge: 2022-09-26 | Disposition: A | Discharge status: Home / Self Care | Payer: Medicare HMO | Source: Ambulatory Visit | Attending: Internal Medicine | Admitting: Internal Medicine

## 2022-09-26 DIAGNOSIS — I5022 Chronic systolic (congestive) heart failure: Secondary | ICD-10-CM

## 2022-09-26 DIAGNOSIS — I110 Hypertensive heart disease with heart failure: Secondary | ICD-10-CM | POA: Diagnosis not present

## 2022-09-26 DIAGNOSIS — I213 ST elevation (STEMI) myocardial infarction of unspecified site: Secondary | ICD-10-CM

## 2022-09-26 DIAGNOSIS — I252 Old myocardial infarction: Secondary | ICD-10-CM | POA: Diagnosis not present

## 2022-09-26 NOTE — Progress Notes (Incomplete)
Discharge Progress Report  Patient Details  Name: BENTLI LLORENTE MRN: [id number] Date of Birth: 13-May-1946 Referring Provider:   Flowsheet Row INTENSIVE CARDIAC REHAB ORIENT from 07/17/2022 in [facility]  Referring Provider Dr. Glori Bickers MD        Number of Visits: 19 sessions, 20 education classes  Reason for Discharge:  Patient reached a stable level of exercise.  Smoking History:  Social History   Tobacco Use  Smoking Status Never  Smokeless Tobacco Never    Diagnosis:  ST elevation myocardial infarction (STEMI), unspecified artery (HCC)  Heart failure, chronic systolic (HCC)  ADL UCSD:   Initial Exercise Prescription:  Initial Exercise Prescription - 07/17/22 1000       Date of Initial Exercise RX and Referring Provider   Date 07/17/22    Referring Provider Dr. Glori Bickers MD    Expected Discharge Date 09/12/22      NuStep   Level 1    SPM 53    Minutes 20    METs 1.3      Prescription Details   Frequency (times per week) 2    Duration Progress to 30 minutes of continuous aerobic without signs/symptoms of physical distress      Intensity   THRR 40-80% of Max Heartrate 58-116    Ratings of Perceived Exertion 11-13    Perceived Dyspnea 0-4      Progression   Progression Continue progressive overload as per policy without signs/symptoms or physical distress.      Resistance Training   Training Prescription Yes    Weight 2    Reps 10-15             Discharge Exercise Prescription (Final Exercise Prescription Changes):  Exercise Prescription Changes - 09/26/22 1400       Response to Exercise   Blood Pressure (Admit) 119/76    Blood Pressure (Exercise) 118/70    Blood Pressure (Exit) 100/70    Heart Rate (Admit) 86 bpm    Heart Rate (Exercise) 106 bpm    Heart Rate (Exit) 94 bpm    Rating of Perceived Exertion (Exercise) 11    Symptoms None    Comments Pt graduated  from the CRP2 program today    Duration Continue with 30 min of aerobic exercise without signs/symptoms of physical distress.    Intensity THRR unchanged      Progression   Progression Continue to progress workloads to maintain intensity without signs/symptoms of physical distress.    Average METs 1.9      Resistance Training   Training Prescription Yes    Weight 2 lbs    Reps 10-15    Time 10 Minutes      Interval Training   Interval Training No      NuStep   Level 1    SPM 88    Minutes 30    METs 1.9      Home Exercise Plan   Plans to continue exercise at Home (comment)    Frequency Add 2 additional days to program exercise sessions.    Initial Home Exercises Provided 08/22/22             Functional Capacity:  6 Minute Walk     Row Name 07/17/22 1009 09/19/22 1036       6 Minute Walk   Phase Initial  Nustep test: poor balance, dizziness and assistive device Discharge  performed on Nustep  Distance 886 feet 1574 feet  .28 km on Nustep equivalent to 1574 feet    Distance % Change -- 77.65 %    Distance Feet Change -- 688 ft    Walk Time 6 minutes 6 minutes    # of Rest Breaks 0 0    MPH 1.68 3    METS 1.2 1.9    RPE 9 13    Perceived Dyspnea  0 0    VO2 Peak 4.2 8.9    Symptoms No No    Resting HR 93 bpm 81 bpm    Resting BP 108/78 116/68    Resting Oxygen Saturation  95 % --    Exercise Oxygen Saturation  during 6 min walk 94 % --    Max Ex. HR 102 bpm 105 bpm    Max Ex. BP 114/74 132/62    2 Minute Post BP 110/74 --             Psychological, QOL, Others - Outcomes: PHQ 2/9:    09/26/2022    9:37 AM 07/17/2022   12:28 PM  Depression screen PHQ 2/9  Decreased Interest 0 0  Down, Depressed, Hopeless 0 0  PHQ - 2 Score 0 0    Quality of Life:  Quality of Life - 09/22/22 1015       Quality of Life   Select Quality of Life      Quality of Life Scores   Health/Function Pre 25.62 %    Health/Function Post 27.21 %    Health/Function  % Change 6.21 %    Socioeconomic Pre 28.5 %    Socioeconomic Post 27.5 %    Socioeconomic % Change  -3.51 %    Psych/Spiritual Pre 29.14 %    Psych/Spiritual Post 28.07 %    Psych/Spiritual % Change -3.67 %    Family Pre 24.38 %    Family Post 25.13 %    Family % Change 3.08 %    GLOBAL Pre 26.79 %    GLOBAL Post 27.2 %    GLOBAL % Change 1.53 %             Personal Goals: Goals established at orientation with interventions provided to work toward goal.  Personal Goals and Risk Factors at Admission - 07/17/22 1037       Core Components/Risk Factors/Patient Goals on Admission    Weight Management Yes;Obesity;Weight Loss    Intervention Weight Management: Develop a combined nutrition and exercise program designed to reach desired caloric intake, while maintaining appropriate intake of nutrient and fiber, sodium and fats, and appropriate energy expenditure required for the weight goal.;Weight Management: Provide education and appropriate resources to help participant work on and attain dietary goals.;Weight Management/Obesity: Establish reasonable short term and long term weight goals.;Obesity: Provide education and appropriate resources to help participant work on and attain dietary goals.    Admit Weight 167 lb 15.9 oz (76.2 kg)    Expected Outcomes Short Term: Continue to assess and modify interventions until short term weight is achieved;Long Term: Adherence to nutrition and physical activity/exercise program aimed toward attainment of established weight goal;Weight Loss: Understanding of general recommendations for a balanced deficit meal plan, which promotes 1-2 lb weight loss per week and includes a negative energy balance of [phone] kcal/d;Understanding recommendations for meals to include 15-35% energy as protein, 25-35% energy from fat, 35-60% energy from carbohydrates, less than 223m of dietary cholesterol, 20-35 gm of total fiber daily;Understanding of distribution of calorie  intake throughout the day with the consumption of 4-5 meals/snacks    Heart Failure Yes    Intervention Provide a combined exercise and nutrition program that is supplemented with education, support and counseling about heart failure. Directed toward relieving symptoms such as shortness of breath, decreased exercise tolerance, and extremity edema.    Expected Outcomes Long term: Adoption of self-care skills and reduction of barriers for early signs and symptoms recognition and intervention leading to self-care maintenance.;Short term: Daily weights obtained and reported for increase. Utilizing diuretic protocols set by physician.;Short term: Attendance in program 2-3 days a week with increased exercise capacity. Reported lower sodium intake. Reported increased fruit and vegetable intake. Reports medication compliance.;Improve functional capacity of life    Hypertension Yes    Intervention Provide education on lifestyle modifcations including regular physical activity/exercise, weight management, moderate sodium restriction and increased consumption of fresh fruit, vegetables, and low fat dairy, alcohol moderation, and smoking cessation.;Monitor prescription use compliance.    Expected Outcomes Short Term: Continued assessment and intervention until BP is < 140/76m HG in hypertensive participants. < 130/867mHG in hypertensive participants with diabetes, heart failure or chronic kidney disease.;Long Term: Maintenance of blood pressure at goal levels.    Lipids Yes    Intervention Provide education and support for participant on nutrition & aerobic/resistive exercise along with prescribed medications to achieve LDL <7089mHDL >79m15m  Expected Outcomes Short Term: Participant states understanding of desired cholesterol values and is compliant with medications prescribed. Participant is following exercise prescription and nutrition guidelines.;Long Term: Cholesterol controlled with medications as prescribed,  with individualized exercise RX and with personalized nutrition plan. Value goals: LDL < 70mg62mL > 40 mg.    Personal Goal Other Yes    Personal Goal Short term: back to ADL's Long term: strenght and stamina    Intervention Will continue to monitor pt and progress workloads as tolerated without sign or symptom    Expected Outcomes Pt will achieve her goals              Personal Goals Discharge:  Goals and Risk Factor Review     Row Name 08/05/22 1226 08/26/22 1551 09/23/22 1524         Core Components/Risk Factors/Patient Goals Review   Personal Goals Review Weight Management/Obesity;Heart Failure;Hypertension;Lipids Weight Management/Obesity;Heart Failure;Hypertension;Lipids Weight Management/Obesity;Heart Failure;Hypertension;Lipids     Review BrendMyrlff to a good start to exercise at intensive cardiac rehab. Vital signs have been stable. BrendJaniseecondtioned. Will continue to monitor. BrendDeondraoing well with exercise at intensive cardiac rehab for her fitness level. Vital signs have been stable. BrendBritaniains deconditioned but is enjoyig participating in the program BrendOllainues to do well with exercise at intensive cardiac rehab for her fitness level. Vital signs have been stable. BrendJosephains deconditioned but is enjoying participating in the program. BrendTekia complete intensive cardiac rehab on 09/1022.     Expected Outcomes BrendEllenor continue to participate in intensive cardiac rehab twice a week for exercise, nutrition and lifestyle modifications BrendKashvi continue to participate in intensive cardiac rehab twice a week for exercise, nutrition and lifestyle modifications BrendLa continue to participate in intensive cardiac rehab twice a week for exercise, nutrition and lifestyle modifications              Exercise Goals and Review:  Exercise Goals     Row Name 07/17/22 1030             Exercise  Goals   Increase Physical Activity Yes        Intervention Provide advice, education, support and counseling about physical activity/exercise needs.;Develop an individualized exercise prescription for aerobic and resistive training based on initial evaluation findings, risk stratification, comorbidities and participant's personal goals.       Expected Outcomes Short Term: Attend rehab on a regular basis to increase amount of physical activity.;Long Term: Add in home exercise to make exercise part of routine and to increase amount of physical activity.;Long Term: Exercising regularly at least 3-5 days a week.       Increase Strength and Stamina Yes       Intervention Provide advice, education, support and counseling about physical activity/exercise needs.;Develop an individualized exercise prescription for aerobic and resistive training based on initial evaluation findings, risk stratification, comorbidities and participant's personal goals.       Expected Outcomes Short Term: Increase workloads from initial exercise prescription for resistance, speed, and METs.;Short Term: Perform resistance training exercises routinely during rehab and add in resistance training at home;Long Term: Improve cardiorespiratory fitness, muscular endurance and strength as measured by increased METs and functional capacity (6MWT)       Able to understand and use rate of perceived exertion (RPE) scale Yes       Intervention Provide education and explanation on how to use RPE scale       Expected Outcomes Short Term: Able to use RPE daily in rehab to express subjective intensity level;Long Term:  Able to use RPE to guide intensity level when exercising independently       Knowledge and understanding of Target Heart Rate Range (THRR) Yes       Intervention Provide education and explanation of THRR including how the numbers were predicted and where they are located for reference       Expected Outcomes Short Term: Able to state/look up THRR;Long Term: Able to use THRR to govern  intensity when exercising independently;Short Term: Able to use daily as guideline for intensity in rehab       Understanding of Exercise Prescription Yes       Intervention Provide education, explanation, and written materials on patient's individual exercise prescription       Expected Outcomes Short Term: Able to explain program exercise prescription;Long Term: Able to explain home exercise prescription to exercise independently                Exercise Goals Re-Evaluation:  Exercise Goals Re-Evaluation     Row Name 07/25/22 1105 08/22/22 1416 09/22/22 1107 09/26/22 1414       Exercise Goal Re-Evaluation   Exercise Goals Review Increase Physical Activity;Increase Strength and Stamina;Able to understand and use rate of perceived exertion (RPE) scale;Knowledge and understanding of Target Heart Rate Range (THRR);Understanding of Exercise Prescription Increase Physical Activity;Increase Strength and Stamina;Able to understand and use rate of perceived exertion (RPE) scale;Knowledge and understanding of Target Heart Rate Range (THRR);Understanding of Exercise Prescription Increase Physical Activity;Increase Strength and Stamina;Able to understand and use rate of perceived exertion (RPE) scale;Knowledge and understanding of Target Heart Rate Range (THRR);Understanding of Exercise Prescription Increase Physical Activity;Increase Strength and Stamina;Able to understand and use rate of perceived exertion (RPE) scale;Knowledge and understanding of Target Heart Rate Range (THRR);Understanding of Exercise Prescription    Comments Pt's first day in the CRP2 program. Pt understands the exercise Rx, RPE scale and THRR. Reviewed METs, goals and home exercise Rx. Pt voices that she is walking better. She also voices that she has  more energy for housework and ADL's. Pt is doing chair exercises for 30 minutes 2-3 x/week. Reviewed METs and goals. Pt continues to voice increased strength and stamina with ADL's.  Continues to do her chair exercises about 2 days/week; encouraged 3x/week. Pt graduated from the [place] program today. Pt had peak METs of 2.0. Pt does chair exercises at home and wil continue to do these for her exercise in addtion to some walking.    Expected Outcomes Will continue to monitor patient and progress exercise workloads as tolerated. Will continue to monitor patient and progress exercise workloads as tolerated. Will continue to monitor patient and progress exercise workloads as tolerated. Pt will continue to exercise at home             Nutrition & Weight - Outcomes:  Pre Biometrics - 07/17/22 1031       Pre Biometrics   Height _0  (1.448 m)    Weight 76.2 kg    Waist Circumference 42.5 inches    Hip Circumference 42.75 inches    Waist to Hip Ratio 0.99 %    BMI (Calculated) 36.34    Triceps Skinfold 38 mm    % Body Fat 50.2 %    Grip Strength 13 kg    Flexibility --   Did not attempt. mobility issues   Single Leg Stand --   Did not attempt, assistive device, previous falls and balance issues            Post Biometrics - 09/19/22 1041        Post  Biometrics   Height _1  (1.448 m)    Weight 78.1 kg    Waist Circumference 42.5 inches    Hip Circumference 43 inches    Waist to Hip Ratio 0.99 %    BMI (Calculated) 37.25    Triceps Skinfold 38 mm    % Body Fat 50.7 %    Grip Strength 13 kg    Flexibility --   Not performed   Single Leg Stand --   Not performed            Nutrition:  Nutrition Therapy & Goals - 09/18/22 0826       Nutrition Therapy   Diet Heart Healthy Diet    Drug/Food Interactions Statins/Certain Fruits      Personal Nutrition Goals   Nutrition Goal Patient to identify strategies for managing cardiovascular risk by attending the [facility] education and nutrition classes weekly    Personal Goal #2 Patient to reduce sodium intake to <1563m daily.    Personal Goal #3 Patient to identify food sources and limit daily intake of  saturated fat, trans fat, sodium, and refined carbohydrates    Comments Goals in progress. Patient continues to report following a heart healthy diet with awareness of foods high in saturated fat and refined carbohydrates.  She is not able to attend the [employer] cooking classes on Wednesdays which could be beneficial to understanding strategies for reducing sodium and increasing intake of high fiber/high potassium foods such as fruits and vegetables. Her daughter remains inconsistent in support of heart healthy habits.      Intervention Plan   Intervention Prescribe, educate and counsel regarding individualized specific dietary modifications aiming towards targeted core components such as weight, hypertension, lipid management, diabetes, heart failure and other comorbidities.;Nutrition handout(s) given to patient.    Expected Outcomes Short Term Goal: Understand basic principles of dietary content, such as calories, fat, sodium, cholesterol and nutrients.;Long Term Goal:  Adherence to prescribed nutrition plan.             Nutrition Discharge:  Nutrition Assessments - 09/29/22 0852       Rate Your Plate Scores   Post Score 70             Education Questionnaire Score:  Knowledge Questionnaire Score - 09/22/22 1015       Knowledge Questionnaire Score   Post Score 23/24             Goals reviewed with patient; copy given to patient.Pt graduates from  Intensive/Traditional cardiac rehab program today with completion of  19  exercise and  20 education sessions. Pt maintained good attendance and progressed nicely during their participation in rehab as evidenced by increased MET level.   Medication list reconciled. Repeat  PHQ score- 0 .  Pt has made significant lifestyle changes and should be commended for their success. Emmilia achieved their goals during cardiac rehab.   Pt plans to continue exercise at home as tolerated. Janus increased her distance on her post exercise nustep  test by 688 feet. We are proud of Auna's progress! Breda reports feeling stronger since participating in the program. Harrell Gave RN BSN

## 2022-12-12 DIAGNOSIS — I2510 Atherosclerotic heart disease of native coronary artery without angina pectoris: Secondary | ICD-10-CM | POA: Diagnosis not present

## 2022-12-12 DIAGNOSIS — E119 Type 2 diabetes mellitus without complications: Secondary | ICD-10-CM | POA: Diagnosis not present

## 2022-12-12 DIAGNOSIS — E7800 Pure hypercholesterolemia, unspecified: Secondary | ICD-10-CM | POA: Diagnosis not present

## 2022-12-12 DIAGNOSIS — I5041 Acute combined systolic (congestive) and diastolic (congestive) heart failure: Secondary | ICD-10-CM | POA: Diagnosis not present

## 2023-01-22 DIAGNOSIS — I5041 Acute combined systolic (congestive) and diastolic (congestive) heart failure: Secondary | ICD-10-CM | POA: Diagnosis not present

## 2023-01-22 DIAGNOSIS — E7800 Pure hypercholesterolemia, unspecified: Secondary | ICD-10-CM | POA: Diagnosis not present

## 2023-01-22 DIAGNOSIS — E119 Type 2 diabetes mellitus without complications: Secondary | ICD-10-CM | POA: Diagnosis not present

## 2023-01-22 DIAGNOSIS — I2510 Atherosclerotic heart disease of native coronary artery without angina pectoris: Secondary | ICD-10-CM | POA: Diagnosis not present

## 2023-01-22 DIAGNOSIS — Z23 Encounter for immunization: Secondary | ICD-10-CM | POA: Diagnosis not present

## 2023-04-20 ENCOUNTER — Other Ambulatory Visit (HOSPITAL_COMMUNITY): Payer: Self-pay | Admitting: Cardiology

## 2023-04-20 DIAGNOSIS — I5022 Chronic systolic (congestive) heart failure: Secondary | ICD-10-CM

## 2023-04-27 ENCOUNTER — Other Ambulatory Visit (HOSPITAL_COMMUNITY): Payer: Self-pay | Admitting: Physician Assistant

## 2023-05-29 DIAGNOSIS — H5203 Hypermetropia, bilateral: Secondary | ICD-10-CM | POA: Diagnosis not present

## 2023-05-29 DIAGNOSIS — R7303 Prediabetes: Secondary | ICD-10-CM | POA: Diagnosis not present

## 2023-05-29 DIAGNOSIS — Z961 Presence of intraocular lens: Secondary | ICD-10-CM | POA: Diagnosis not present

## 2023-07-09 ENCOUNTER — Telehealth (HOSPITAL_COMMUNITY): Payer: Self-pay | Admitting: *Deleted

## 2023-07-09 NOTE — Telephone Encounter (Signed)
Received FMLA forms for pt's daughter, Amy [name], pt last seen in clinic 07/24/22, f/u is sch for 9/13 will address FMLA needs at that time, forms placed in folder for upcoming appts.

## 2023-07-31 ENCOUNTER — Ambulatory Visit (HOSPITAL_BASED_OUTPATIENT_CLINIC_OR_DEPARTMENT_OTHER)
Admission: RE | Admit: 2023-07-31 | Discharge: 2023-07-31 | Disposition: A | Discharge status: Home / Self Care | Payer: Medicare HMO | Source: Ambulatory Visit | Attending: Internal Medicine | Admitting: Internal Medicine

## 2023-07-31 ENCOUNTER — Encounter (HOSPITAL_COMMUNITY): Payer: Self-pay | Admitting: Internal Medicine

## 2023-07-31 ENCOUNTER — Ambulatory Visit (HOSPITAL_COMMUNITY)
Admission: RE | Admit: 2023-07-31 | Discharge: 2023-07-31 | Disposition: A | Discharge status: Home / Self Care | Payer: Medicare HMO | Source: Ambulatory Visit | Attending: Internal Medicine | Admitting: Internal Medicine

## 2023-07-31 VITALS — BP 140/80 | HR 76 | Wt 163.6 lb

## 2023-07-31 DIAGNOSIS — I5082 Biventricular heart failure: Secondary | ICD-10-CM | POA: Insufficient documentation

## 2023-07-31 DIAGNOSIS — I2721 Secondary pulmonary arterial hypertension: Secondary | ICD-10-CM | POA: Diagnosis not present

## 2023-07-31 DIAGNOSIS — I071 Rheumatic tricuspid insufficiency: Secondary | ICD-10-CM | POA: Diagnosis not present

## 2023-07-31 DIAGNOSIS — E669 Obesity, unspecified: Secondary | ICD-10-CM | POA: Insufficient documentation

## 2023-07-31 DIAGNOSIS — R7303 Prediabetes: Secondary | ICD-10-CM | POA: Insufficient documentation

## 2023-07-31 DIAGNOSIS — I350 Nonrheumatic aortic (valve) stenosis: Secondary | ICD-10-CM

## 2023-07-31 DIAGNOSIS — I441 Atrioventricular block, second degree: Secondary | ICD-10-CM | POA: Diagnosis not present

## 2023-07-31 DIAGNOSIS — I5022 Chronic systolic (congestive) heart failure: Secondary | ICD-10-CM

## 2023-07-31 DIAGNOSIS — I5042 Chronic combined systolic (congestive) and diastolic (congestive) heart failure: Secondary | ICD-10-CM

## 2023-07-31 DIAGNOSIS — I2510 Atherosclerotic heart disease of native coronary artery without angina pectoris: Secondary | ICD-10-CM | POA: Diagnosis not present

## 2023-07-31 DIAGNOSIS — Z7902 Long term (current) use of antithrombotics/antiplatelets: Secondary | ICD-10-CM | POA: Diagnosis not present

## 2023-07-31 DIAGNOSIS — I252 Old myocardial infarction: Secondary | ICD-10-CM | POA: Diagnosis not present

## 2023-07-31 DIAGNOSIS — Z6835 Body mass index (BMI) 35.0-35.9, adult: Secondary | ICD-10-CM | POA: Insufficient documentation

## 2023-07-31 LAB — BRAIN NATRIURETIC PEPTIDE: B Natriuretic Peptide: 365.2 pg/mL — ABNORMAL HIGH (ref 0.0–100.0)

## 2023-07-31 LAB — ECHOCARDIOGRAM COMPLETE
AR max vel: 0.87 cm2
AV Area VTI: 0.76 cm2
AV Area mean vel: 0.88 cm2
AV Mean grad: 27 mmHg
AV Peak grad: 46.6 mmHg
Ao pk vel: 3.41 m/s
Area-P 1/2: 3.86 cm2
Calc EF: 30.7 %
S' Lateral: 4.4 cm
Single Plane A2C EF: 31.1 %
Single Plane A4C EF: 33 %

## 2023-07-31 LAB — CBC
HCT: 49.8 % — ABNORMAL HIGH (ref 36.0–46.0)
Hemoglobin: 16.6 g/dL — ABNORMAL HIGH (ref 12.0–15.0)
MCH: 31.2 pg (ref 26.0–34.0)
MCHC: 33.3 g/dL (ref 30.0–36.0)
MCV: 93.6 fL (ref 80.0–100.0)
Platelets: 237 10*3/uL (ref 150–400)
RBC: 5.32 MIL/uL — ABNORMAL HIGH (ref 3.87–5.11)
RDW: 14.1 % (ref 11.5–15.5)
WBC: 8.8 10*3/uL (ref 4.0–10.5)
nRBC: 0 % (ref 0.0–0.2)

## 2023-07-31 LAB — BASIC METABOLIC PANEL
Anion gap: 13 (ref 5–15)
BUN: 22 mg/dL (ref 8–23)
CO2: 22 mmol/L (ref 22–32)
Calcium: 9.6 mg/dL (ref 8.9–10.3)
Chloride: 103 mmol/L (ref 98–111)
Creatinine, Ser: 0.85 mg/dL (ref 0.44–1.00)
GFR, Estimated: >60 mL/min (ref >60)
Glucose, Bld: 108 mg/dL — ABNORMAL HIGH (ref 70–99)
Potassium: 4.3 mmol/L (ref 3.5–5.1)
Sodium: 138 mmol/L (ref 135–145)

## 2023-07-31 NOTE — Patient Instructions (Signed)
Medication Changes:  No Changes In Medications at this time.   Lab Work:  Labs done today, your results will be available in MyChart, we will contact you for abnormal readings.  Testing/Procedures:  HEART CATH- PLEASE FOLLOW INSTRUCTIONS AS LISTED BELOW   Special Instructions // Education:   [name] [address] [place] [address] Dept: [phone] Loc: [phone]  Amy [name]  07/31/2023  You are scheduled for a Cardiac Catheterization on Tuesday, September 24 with Dr. Arvilla Meres.  1. Please arrive at the [facility] (Main Entrance A) at [facility]: [address] [place], [place] [address] at 8:30 AM (This time is 2 hour(s) before your procedure to ensure your preparation). Free valet parking service is available. You will check in at ADMITTING. The support person will be asked to wait in the waiting room.  It is OK to have someone drop you off and come back when you are ready to be discharged.    Special note: Every effort is made to have your procedure done on time. Please understand that emergencies sometimes delay scheduled procedures.  2. Diet: Do not eat solid foods after midnight.  The patient may have clear liquids until 5am upon the day of the procedure.  3. Labs: You will need to have blood drawn on TODAY   4. Medication instructions in preparation for your procedure:   Contrast Allergy: No  DO NOT TAKE JARDIANCE THE MORNING OF PROCEDURE   DO NOT TAKE TORSEMIDE THE MORNING OF PROCEDURE   5. Plan to go home the same day, you will only stay overnight if medically necessary. 6. Bring a current list of your medications and current insurance cards. 7. You MUST have a responsible person to drive you home. 8. Someone MUST be with you the first 24 hours after you arrive home or your discharge will be delayed. 9. Please wear clothes that are easy to get on and off  and wear slip-on shoes.  Thank you for allowing [place] to care for you!   -- [name]  Follow-Up in: 2 MONTHS WITH APP-   At the [facility], you and your health needs are our priority. We have a designated team specialized in the treatment of Heart Failure. This Care Team includes your primary Heart Failure Specialized Cardiologist (physician), Advanced Practice Providers (APPs- Physician Assistants and Nurse Practitioners), and Pharmacist who all work together to provide you with the care you need, when you need it.   You may see any of the following providers on your designated Care Team at your next follow up:  Dr. Arvilla Meres Dr. Marca Ancona Dr. Marcos Eke, NP Robbie Lis, [place] [facility] [place], [place] Brynda Peon, NP Karle Plumber, PharmD   Please be sure to bring in all your medications bottles to every appointment.   Need to Contact us:  If you have any questions or concerns before your next appointment please send [place] a message through [place] or call our office at [phone].    TO LEAVE A MESSAGE FOR THE NURSE SELECT OPTION 2, PLEASE LEAVE A MESSAGE INCLUDING: YOUR NAME DATE OF BIRTH CALL BACK NUMBER REASON FOR CALL**this is important as we prioritize the call backs  YOU WILL RECEIVE A CALL BACK THE SAME DAY AS LONG AS YOU CALL BEFORE 4:00 PM

## 2023-07-31 NOTE — H&P (View-Only) (Signed)
ADVANCED HF CLINIC NOTE  Primary Care: Noberto Retort, MD HF Cardiologist: Dr. 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    Admitted 4/23 with late presentation inferior STEMI. Not taken for urgent cardiac cath as she completed her MI and had AKI. Course complicated by RV infarct>>cardiogenic shock. 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 EP consulted and felt to be secondary to late presentation MI. Felt conduction would improve and no need for permanent pacemaker. Echo showed EF 30-35%, RV severely dilated with severely reduced function, moderate to severe TR, moderate to severe AS. She was given IV fluids and started on DBA for RV support.  Scr improved and underwent R/LHC showing (on DBA 2.5): 100% RCA, 60% LM, 50% Lcx, 60% p to m LAD, 90% Ramus, RA 19 (with prominent v waves), PA 27/11 (19), PCWP 5, Fick CO 4.1/Index 2.3, PA sat 43%/49%, PAPi 0.8. Palliative consulted and she remained DNR. DBA weaned off and GDMT titrated. Discharged home with HH, weight 180 lbs.  Echo 07/24/22: EF 40-45% Moderate AS. RV moderately dilated/HK. Severe TR  Today she returns for HF follow up. Doing pretty well. Says she has been slightly more SOB "since ragweed season". Takes 10mg  torsemide 1-3x/week as needed for fluid. Denies CP, orthopnea or edema. Occasional dizziness. Needs syncope/presyncope. Can do all ADLs without problem. Denies snoring.     Echo today 9l13/24 EF 40-45% inferior/inferolateral AK. Septal flattening. Severe LFLG AS RV moderately dilated/HK Severe TR Personally reviewed  Cardiac Studies: - Echo (4/23): EF 30-35%, RV severely dilated with severely reduced function, inferolateral akinesis, moderate to severe TR, moderate to severe AS.   - R/LHC (4/23): CAD with interval occlusion of ostial RCA with associate large RV infarct,  boderline 60% LM lesion, mild AS, hemodynamics c/w with severe RV failure and severe TR    Ost RCA to Prox RCA lesion is 100% stenosed.   Ost LM lesion is 60% stenosed.   Ost Cx lesion is 50% stenosed.   Prox LAD to Mid LAD lesion is 60% stenosed.   Ramus lesion is 90% stenosed.  On dobutamine 2.5   Ao = 83/53 (67) LV = 92/14 RA = 19 (with prominent v- waves) RV = 28/18 PA = 27/11 (19) PCW = 5 Fick cardiac output/index = 4.1/2.3 PVR = 3.4 WU FA sat = 88% PA sat = 43%, 49% PAPi = 0.8 AoV gradient peak to peak 2.64mmHG on pullback    Past Medical History:  Diagnosis Date   CHF (congestive heart failure) (HCC)    Coronary artery disease    GERD (gastroesophageal reflux disease)    Hyperlipidemia    Obesity    Pre-diabetes    Current Outpatient Medications  Medication Sig Dispense Refill   acetaminophen (TYLENOL) 500 MG tablet Take 500 mg by mouth every 6 (six) hours as needed for mild pain or headache.     aspirin 81 MG EC tablet Take 81 mg by mouth every evening.     atorvastatin (LIPITOR) 80 MG tablet Take 1 tablet (80 mg total) by mouth daily. 30 tablet 0   Carboxymethylcellulose Sodium (DRY EYE RELIEF OP) Place 1 drop into both eyes 4 (four) times daily as needed (dryness).     clopidogrel (PLAVIX) 75 MG tablet Take 1 tablet (75 mg total) by mouth daily. 31 tablet 2   Coenzyme  Q10 (CO Q-10) 200 MG CAPS Take 200 mg by mouth daily.     Cyanocobalamin (VITAMIN B 12 PO) Take 1 tablet by mouth daily.     digoxin (LANOXIN) 0.125 MG tablet Take 1 tablet (0.125 mg total) by mouth daily. 30 tablet 11   JARDIANCE 10 MG TABS tablet TAKE 1 TABLET EVERY DAY 90 tablet 3   meclizine (ANTIVERT) 25 MG tablet Take 25 mg by mouth daily.     metoprolol succinate (TOPROL XL) 25 MG 24 hr tablet Take 1/2 tab Daily 15 tablet 11   Multiple Vitamin (MULTI-VITAMIN) tablet Take 1 tablet by mouth daily.     Pramoxine-Dimethicone (GOLD BOND INTENSIVE HEALING EX) Apply 1 application  topically daily  as needed (eczema).     torsemide (DEMADEX) 20 MG tablet Take 10 mg by mouth daily as needed (fluid retention.).     No current facility-administered medications for this encounter.   Allergies  Allergen Reactions   Timolol Shortness Of Breath   Codeine Other (See Comments)    sick   Morphine And Codeine Nausea And Vomiting   Other Other (See Comments)    vicryl sutures - rash/redness, delay in healing   Social History   Socioeconomic History   Marital status: Married    Spouse name: Not on file   Number of children: Not on file   Years of education: 12   Highest education level: High school graduate  Occupational History   Occupation: retired  Tobacco Use   Smoking status: Never   Smokeless tobacco: Never  Vaping Use   Vaping status: Not on file  Substance and Sexual Activity   Alcohol use: No   Drug use: No   Sexual activity: Not Currently  Other Topics Concern   Not on file  Social History Narrative   Not on file   Social Determinants of Health   Financial Resource Strain: Not on file  Food Insecurity: No Food Insecurity (07/24/2022)   Hunger Vital Sign    Worried About Running Out of Food in the Last Year: Never true    Ran Out of Food in the Last Year: Never true  Transportation Needs: No Transportation Needs (07/24/2022)   PRAPARE - Administrator, Civil Service (Medical): No    Lack of Transportation (Non-Medical): No  Physical Activity: Not on file  Stress: Not on file  Social Connections: Not on file  Intimate Partner Violence: Not on file   Family History  Problem Relation Age of Onset   CAD Neg Hx    BP (!) 140/80   Pulse 76   Wt 74.2 kg (163 lb 9.6 oz)   SpO2 92%   BMI 35.40 kg/m   Wt Readings from Last 3 Encounters:  07/31/23 74.2 kg (163 lb 9.6 oz)  09/19/22 78.1 kg (172 lb 2.9 oz)  07/24/22 76.7 kg (169 lb 3.2 oz)   PHYSICAL EXAM: General:  Elderly No resp difficulty HEENT: normal Neck: supple. JVP 7-8  Carotids 1+ bilat;  + bruits. No lymphadenopathy or thryomegaly appreciated. Cor: PMI nondisplaced. Regular rate & rhythm. + RV lift. 3/6 high-pitched AS S2 significantly reduced. 3/6 TR Lungs: clear Abdomen: soft, nontender, nondistended. No hepatosplenomegaly. No bruits or masses. Good bowel sounds. Extremities: no cyanosis, clubbing, rash, tr-1+edema Neuro: alert & orientedx3, cranial nerves grossly intact. moves all 4 extremities w/o difficulty. Affect pleasant   I walked her down hall. Sats 92-97%  ECG: NSR 72 RBBB Personally reviewed  ASSESSMENT & PLAN:  1. 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  - LHC (4/23): 100% RCA, 60% LM, 50% Lcx, 60% p to m LAD, 90% Ramus. Manage medically. - No s/s angina - Continue DAPT/statin/beta blocker. - Has gone to CR orientation   2. Chronic biventricular heart failure: - EF down to 30-35% in 12/22 in setting of COVID-19 infection.  - LV function subsequently recovered, Echo (3/23): EF up to 50-55%  - Echo (4/23): EF 30-35%, RV severely reduced, moderate to severe TR, moderate to severe aortic valve stenosis with mean gradient 12 mmHg and AVA 0.85 cm2 - RHC (4/23) on 2.5 DBA: severe RV failure and severe TR - RA 19 (prominent v waves), PCWP 5, Fick CO 4.1/Index 2.3, PAPi 2.8 - Echo 07/24/22: EF 40-45% Moderate AS. RV moderately dilated/HK. Severe TR  - Echo today 07/31/23 EF 40-45% inferior/inferolateral AK. Septal flattening. Severe LFLG AS RV moderately dilated/HK Severe TR Personally reviewed - Echo with progressive AS/pulmonary HTN and RV strain but remains relatively stable NYHA II-III - Volume mildly elevated - We discussed options of repeat cath with eye toward possible TAVR and further management of PAH or continue watchful waiting. She would like to proceed with cath. - Continue spiro 12.5 mg daily. - Continue Toprol XL 12.5 mg daily. - Continue Jardiance 10 mg daily. - Continue  digoxin 0.125 mg daily. Dig level 0.6 on 03/17/22 - Continue torsemide 10 mg daily PRN. - Off Entresto with hyperkalemia.  - Labs today   3. Tricuspid Regurgitation, severe: - Appears to have worsening RV failure/PAH on echo today - Plan R/L cath - If deteriorates can consider TV clip    4. Aortic valve stenosis: - Severe on echo today with LFLG  physiology - Cath as above   5. Mobitz II second degree AV block: - EP has seen, no indications for pacemaker. It was felt that this would likely recover with time.   6. Prediabetes: - A1c 6.3%. - Continue SGLT2i.    Arvilla Meres, MD  1:00 PM

## 2023-07-31 NOTE — Progress Notes (Signed)
ADVANCED HF CLINIC NOTE  Primary Care: Noberto Retort, MD HF Cardiologist: Dr. 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    Admitted 4/23 with late presentation inferior STEMI. Not taken for urgent cardiac cath as she completed her MI and had AKI. Course complicated by RV infarct>>cardiogenic shock. 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 EP consulted and felt to be secondary to late presentation MI. Felt conduction would improve and no need for permanent pacemaker. Echo showed EF 30-35%, RV severely dilated with severely reduced function, moderate to severe TR, moderate to severe AS. She was given IV fluids and started on DBA for RV support.  Scr improved and underwent R/LHC showing (on DBA 2.5): 100% RCA, 60% LM, 50% Lcx, 60% p to m LAD, 90% Ramus, RA 19 (with prominent v waves), PA 27/11 (19), PCWP 5, Fick CO 4.1/Index 2.3, PA sat 43%/49%, PAPi 0.8. Palliative consulted and she remained DNR. DBA weaned off and GDMT titrated. Discharged home with HH, weight 180 lbs.  Echo 07/24/22: EF 40-45% Moderate AS. RV moderately dilated/HK. Severe TR  Today she returns for HF follow up. Doing pretty well. Says she has been slightly more SOB "since ragweed season". Takes 10mg  torsemide 1-3x/week as needed for fluid. Denies CP, orthopnea or edema. Occasional dizziness. Needs syncope/presyncope. Can do all ADLs without problem. Denies snoring.     Echo today 9l13/24 EF 40-45% inferior/inferolateral AK. Septal flattening. Severe LFLG AS RV moderately dilated/HK Severe TR Personally reviewed  Cardiac Studies: - Echo (4/23): EF 30-35%, RV severely dilated with severely reduced function, inferolateral akinesis, moderate to severe TR, moderate to severe AS.   - R/LHC (4/23): CAD with interval occlusion of ostial RCA with associate large RV infarct,  boderline 60% LM lesion, mild AS, hemodynamics c/w with severe RV failure and severe TR    Ost RCA to Prox RCA lesion is 100% stenosed.   Ost LM lesion is 60% stenosed.   Ost Cx lesion is 50% stenosed.   Prox LAD to Mid LAD lesion is 60% stenosed.   Ramus lesion is 90% stenosed.  On dobutamine 2.5   Ao = 83/53 (67) LV = 92/14 RA = 19 (with prominent v- waves) RV = 28/18 PA = 27/11 (19) PCW = 5 Fick cardiac output/index = 4.1/2.3 PVR = 3.4 WU FA sat = 88% PA sat = 43%, 49% PAPi = 0.8 AoV gradient peak to peak 2.71mmHG on pullback    Past Medical History:  Diagnosis Date   CHF (congestive heart failure) (HCC)    Coronary artery disease    GERD (gastroesophageal reflux disease)    Hyperlipidemia    Obesity    Pre-diabetes    Current Outpatient Medications  Medication Sig Dispense Refill   acetaminophen (TYLENOL) 500 MG tablet Take 500 mg by mouth every 6 (six) hours as needed for mild pain or headache.     aspirin 81 MG EC tablet Take 81 mg by mouth every evening.     atorvastatin (LIPITOR) 80 MG tablet Take 1 tablet (80 mg total) by mouth daily. 30 tablet 0   Carboxymethylcellulose Sodium (DRY EYE RELIEF OP) Place 1 drop into both eyes 4 (four) times daily as needed (dryness).     clopidogrel (PLAVIX) 75 MG tablet Take 1 tablet (75 mg total) by mouth daily. 31 tablet 2   Coenzyme  Q10 (CO Q-10) 200 MG CAPS Take 200 mg by mouth daily.     Cyanocobalamin (VITAMIN B 12 PO) Take 1 tablet by mouth daily.     digoxin (LANOXIN) 0.125 MG tablet Take 1 tablet (0.125 mg total) by mouth daily. 30 tablet 11   JARDIANCE 10 MG TABS tablet TAKE 1 TABLET EVERY DAY 90 tablet 3   meclizine (ANTIVERT) 25 MG tablet Take 25 mg by mouth daily.     metoprolol succinate (TOPROL XL) 25 MG 24 hr tablet Take 1/2 tab Daily 15 tablet 11   Multiple Vitamin (MULTI-VITAMIN) tablet Take 1 tablet by mouth daily.     Pramoxine-Dimethicone (GOLD BOND INTENSIVE HEALING EX) Apply 1 application  topically daily  as needed (eczema).     torsemide (DEMADEX) 20 MG tablet Take 10 mg by mouth daily as needed (fluid retention.).     No current facility-administered medications for this encounter.   Allergies  Allergen Reactions   Timolol Shortness Of Breath   Codeine Other (See Comments)    sick   Morphine And Codeine Nausea And Vomiting   Other Other (See Comments)    vicryl sutures - rash/redness, delay in healing   Social History   Socioeconomic History   Marital status: Married    Spouse name: Not on file   Number of children: Not on file   Years of education: 12   Highest education level: High school graduate  Occupational History   Occupation: retired  Tobacco Use   Smoking status: Never   Smokeless tobacco: Never  Vaping Use   Vaping status: Not on file  Substance and Sexual Activity   Alcohol use: No   Drug use: No   Sexual activity: Not Currently  Other Topics Concern   Not on file  Social History Narrative   Not on file   Social Determinants of Health   Financial Resource Strain: Not on file  Food Insecurity: No Food Insecurity (07/24/2022)   Hunger Vital Sign    Worried About Running Out of Food in the Last Year: Never true    Ran Out of Food in the Last Year: Never true  Transportation Needs: No Transportation Needs (07/24/2022)   PRAPARE - Administrator, Civil Service (Medical): No    Lack of Transportation (Non-Medical): No  Physical Activity: Not on file  Stress: Not on file  Social Connections: Not on file  Intimate Partner Violence: Not on file   Family History  Problem Relation Age of Onset   CAD Neg Hx    BP (!) 140/80   Pulse 76   Wt 74.2 kg (163 lb 9.6 oz)   SpO2 92%   BMI 35.40 kg/m   Wt Readings from Last 3 Encounters:  07/31/23 74.2 kg (163 lb 9.6 oz)  09/19/22 78.1 kg (172 lb 2.9 oz)  07/24/22 76.7 kg (169 lb 3.2 oz)   PHYSICAL EXAM: General:  Elderly No resp difficulty HEENT: normal Neck: supple. JVP 7-8  Carotids 1+ bilat;  + bruits. No lymphadenopathy or thryomegaly appreciated. Cor: PMI nondisplaced. Regular rate & rhythm. + RV lift. 3/6 high-pitched AS S2 significantly reduced. 3/6 TR Lungs: clear Abdomen: soft, nontender, nondistended. No hepatosplenomegaly. No bruits or masses. Good bowel sounds. Extremities: no cyanosis, clubbing, rash, tr-1+edema Neuro: alert & orientedx3, cranial nerves grossly intact. moves all 4 extremities w/o difficulty. Affect pleasant   I walked her down hall. Sats 92-97%  ECG: NSR 72 RBBB Personally reviewed  ASSESSMENT & PLAN:  1. 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  - LHC (4/23): 100% RCA, 60% LM, 50% Lcx, 60% p to m LAD, 90% Ramus. Manage medically. - No s/s angina - Continue DAPT/statin/beta blocker. - Has gone to CR orientation   2. Chronic biventricular heart failure: - EF down to 30-35% in 12/22 in setting of COVID-19 infection.  - LV function subsequently recovered, Echo (3/23): EF up to 50-55%  - Echo (4/23): EF 30-35%, RV severely reduced, moderate to severe TR, moderate to severe aortic valve stenosis with mean gradient 12 mmHg and AVA 0.85 cm2 - RHC (4/23) on 2.5 DBA: severe RV failure and severe TR - RA 19 (prominent v waves), PCWP 5, Fick CO 4.1/Index 2.3, PAPi 2.8 - Echo 07/24/22: EF 40-45% Moderate AS. RV moderately dilated/HK. Severe TR  - Echo today 07/31/23 EF 40-45% inferior/inferolateral AK. Septal flattening. Severe LFLG AS RV moderately dilated/HK Severe TR Personally reviewed - Echo with progressive AS/pulmonary HTN and RV strain but remains relatively stable NYHA II-III - Volume mildly elevated - We discussed options of repeat cath with eye toward possible TAVR and further management of PAH or continue watchful waiting. She would like to proceed with cath. - Continue spiro 12.5 mg daily. - Continue Toprol XL 12.5 mg daily. - Continue Jardiance 10 mg daily. - Continue  digoxin 0.125 mg daily. Dig level 0.6 on 03/17/22 - Continue torsemide 10 mg daily PRN. - Off Entresto with hyperkalemia.  - Labs today   3. Tricuspid Regurgitation, severe: - Appears to have worsening RV failure/PAH on echo today - Plan R/L cath - If deteriorates can consider TV clip    4. Aortic valve stenosis: - Severe on echo today with LFLG  physiology - Cath as above   5. Mobitz II second degree AV block: - EP has seen, no indications for pacemaker. It was felt that this would likely recover with time.   6. Prediabetes: - A1c 6.3%. - Continue SGLT2i.    Arvilla Meres, MD  1:00 PM

## 2023-08-03 ENCOUNTER — Telehealth: Payer: Self-pay | Admitting: *Deleted

## 2023-08-05 ENCOUNTER — Other Ambulatory Visit (HOSPITAL_COMMUNITY): Payer: Self-pay

## 2023-08-05 DIAGNOSIS — I5042 Chronic combined systolic (congestive) and diastolic (congestive) heart failure: Secondary | ICD-10-CM

## 2023-08-07 ENCOUNTER — Telehealth (HOSPITAL_COMMUNITY): Payer: Self-pay

## 2023-08-07 NOTE — Telephone Encounter (Signed)
Received patients daughters FMLA forms- attempted to call, left message to call back to discuss needs.

## 2023-08-10 ENCOUNTER — Telehealth (HOSPITAL_COMMUNITY): Payer: Self-pay

## 2023-08-10 NOTE — Telephone Encounter (Signed)
Called patient to remind her of procedure scheduled for tomorrow. Aware of nothing to eat or drink after midnight. Aware of holding  Jardiance and torsemide morning of procedure. Has transportation to any from procedure

## 2023-08-11 ENCOUNTER — Ambulatory Visit (HOSPITAL_COMMUNITY)
Admission: RE | Admit: 2023-08-11 | Discharge: 2023-08-11 | Disposition: A | Discharge status: Home / Self Care | Payer: Medicare HMO | Source: Ambulatory Visit | Attending: Internal Medicine | Admitting: Internal Medicine

## 2023-08-11 ENCOUNTER — Encounter (HOSPITAL_COMMUNITY)
Admission: RE | Disposition: A | Discharge status: Home / Self Care | Payer: Self-pay | Source: Ambulatory Visit | Attending: Internal Medicine

## 2023-08-11 ENCOUNTER — Other Ambulatory Visit: Payer: Self-pay

## 2023-08-11 DIAGNOSIS — K219 Gastro-esophageal reflux disease without esophagitis: Secondary | ICD-10-CM | POA: Insufficient documentation

## 2023-08-11 DIAGNOSIS — I082 Rheumatic disorders of both aortic and tricuspid valves: Secondary | ICD-10-CM | POA: Insufficient documentation

## 2023-08-11 DIAGNOSIS — I5032 Chronic diastolic (congestive) heart failure: Secondary | ICD-10-CM

## 2023-08-11 DIAGNOSIS — I252 Old myocardial infarction: Secondary | ICD-10-CM | POA: Diagnosis not present

## 2023-08-11 DIAGNOSIS — I5042 Chronic combined systolic (congestive) and diastolic (congestive) heart failure: Secondary | ICD-10-CM | POA: Insufficient documentation

## 2023-08-11 DIAGNOSIS — E785 Hyperlipidemia, unspecified: Secondary | ICD-10-CM | POA: Diagnosis not present

## 2023-08-11 DIAGNOSIS — Z6834 Body mass index (BMI) 34.0-34.9, adult: Secondary | ICD-10-CM | POA: Diagnosis not present

## 2023-08-11 DIAGNOSIS — I2582 Chronic total occlusion of coronary artery: Secondary | ICD-10-CM | POA: Insufficient documentation

## 2023-08-11 DIAGNOSIS — I5082 Biventricular heart failure: Secondary | ICD-10-CM | POA: Diagnosis not present

## 2023-08-11 DIAGNOSIS — Z66 Do not resuscitate: Secondary | ICD-10-CM | POA: Diagnosis not present

## 2023-08-11 DIAGNOSIS — I2510 Atherosclerotic heart disease of native coronary artery without angina pectoris: Secondary | ICD-10-CM

## 2023-08-11 DIAGNOSIS — E669 Obesity, unspecified: Secondary | ICD-10-CM | POA: Insufficient documentation

## 2023-08-11 DIAGNOSIS — I350 Nonrheumatic aortic (valve) stenosis: Secondary | ICD-10-CM | POA: Diagnosis not present

## 2023-08-11 DIAGNOSIS — Z79899 Other long term (current) drug therapy: Secondary | ICD-10-CM | POA: Insufficient documentation

## 2023-08-11 DIAGNOSIS — R7303 Prediabetes: Secondary | ICD-10-CM | POA: Insufficient documentation

## 2023-08-11 DIAGNOSIS — I441 Atrioventricular block, second degree: Secondary | ICD-10-CM | POA: Diagnosis not present

## 2023-08-11 HISTORY — PX: RIGHT/LEFT HEART CATH AND CORONARY ANGIOGRAPHY: CATH118266

## 2023-08-11 LAB — POCT I-STAT EG7
Acid-Base Excess: 0 mmol/L (ref 0.0–2.0)
Bicarbonate: 25.7 mmol/L (ref 20.0–28.0)
Calcium, Ion: 1.15 mmol/L (ref 1.15–1.40)
HCT: 44 % (ref 36.0–46.0)
Hemoglobin: 15 g/dL (ref 12.0–15.0)
O2 Saturation: 64 %
Potassium: 4 mmol/L (ref 3.5–5.1)
Sodium: 142 mmol/L (ref 135–145)
TCO2: 27 mmol/L (ref 22–32)
pCO2, Ven: 44.2 mmHg (ref 44–60)
pH, Ven: 7.372 (ref 7.25–7.43)
pO2, Ven: 35 mmHg (ref 32–45)

## 2023-08-11 LAB — GLUCOSE, CAPILLARY
Glucose-Capillary: 122 mg/dL — ABNORMAL HIGH (ref 70–99)
Glucose-Capillary: 112 mg/dL — ABNORMAL HIGH (ref 70–99)

## 2023-08-11 SURGERY — RIGHT/LEFT HEART CATH AND CORONARY ANGIOGRAPHY
Anesthesia: LOCAL

## 2023-08-11 MED ORDER — LIDOCAINE HCL (PF) 1 % IJ SOLN
INTRAMUSCULAR | Status: AC
  Filled 2023-08-11: qty 30

## 2023-08-11 MED ORDER — HEPARIN (PORCINE) IN NACL 1000-0.9 UT/500ML-% IV SOLN
INTRAVENOUS | Status: DC | PRN
  Administered 2023-08-11 (×2): 500 mL

## 2023-08-11 MED ORDER — LIDOCAINE HCL (PF) 1 % IJ SOLN
INTRAMUSCULAR | Status: DC | PRN
  Administered 2023-08-11 (×2): 2 mL via INTRADERMAL

## 2023-08-11 MED ORDER — CLOPIDOGREL BISULFATE 75 MG PO TABS
75.0000 mg | ORAL_TABLET | Freq: Once | ORAL | Status: AC
  Administered 2023-08-11: 75 mg via ORAL
  Filled 2023-08-11: qty 1

## 2023-08-11 MED ORDER — HYDRALAZINE HCL 20 MG/ML IJ SOLN: 10.0000 mg | INTRAMUSCULAR | Status: DC | PRN

## 2023-08-11 MED ORDER — VERAPAMIL HCL 2.5 MG/ML IV SOLN
INTRAVENOUS | Status: AC
  Filled 2023-08-11: qty 2

## 2023-08-11 MED ORDER — ASPIRIN 81 MG PO CHEW
81.0000 mg | CHEWABLE_TABLET | Freq: Once | ORAL | Status: AC
  Administered 2023-08-11: 81 mg via ORAL
  Filled 2023-08-11: qty 1

## 2023-08-11 MED ORDER — HYDROMORPHONE HCL 1 MG/ML IJ SOLN
INTRAMUSCULAR | Status: AC
  Filled 2023-08-11: qty 0.5

## 2023-08-11 MED ORDER — SODIUM CHLORIDE 0.9% FLUSH: 3.0000 mL | Freq: Two times a day (BID) | INTRAVENOUS | Status: DC

## 2023-08-11 MED ORDER — ACETAMINOPHEN 325 MG PO TABS: 650.0000 mg | ORAL_TABLET | ORAL | Status: DC | PRN

## 2023-08-11 MED ORDER — SODIUM CHLORIDE 0.9 % IV SOLN: INTRAVENOUS | Status: DC

## 2023-08-11 MED ORDER — MIDAZOLAM HCL 2 MG/2ML IJ SOLN
INTRAMUSCULAR | Status: DC | PRN
  Administered 2023-08-11: 1 mg via INTRAVENOUS

## 2023-08-11 MED ORDER — VERAPAMIL HCL 2.5 MG/ML IV SOLN
INTRAVENOUS | Status: DC | PRN
  Administered 2023-08-11: 10 mL via INTRA_ARTERIAL

## 2023-08-11 MED ORDER — HEPARIN SODIUM (PORCINE) 1000 UNIT/ML IJ SOLN
INTRAMUSCULAR | Status: DC | PRN
  Administered 2023-08-11: 3000 [IU] via INTRAVENOUS

## 2023-08-11 MED ORDER — HEPARIN SODIUM (PORCINE) 1000 UNIT/ML IJ SOLN
INTRAMUSCULAR | Status: AC
  Filled 2023-08-11: qty 10

## 2023-08-11 MED ORDER — SODIUM CHLORIDE 0.9% FLUSH: 3.0000 mL | INTRAVENOUS | Status: DC | PRN

## 2023-08-11 MED ORDER — MIDAZOLAM HCL 2 MG/2ML IJ SOLN
INTRAMUSCULAR | Status: AC
  Filled 2023-08-11: qty 2

## 2023-08-11 MED ORDER — ONDANSETRON HCL 4 MG/2ML IJ SOLN: 4.0000 mg | Freq: Four times a day (QID) | INTRAMUSCULAR | Status: DC | PRN

## 2023-08-11 MED ORDER — SODIUM CHLORIDE 0.9 % IV SOLN: 250.0000 mL | INTRAVENOUS | Status: DC | PRN

## 2023-08-11 MED ORDER — IOHEXOL 350 MG/ML SOLN
INTRAVENOUS | Status: DC | PRN
  Administered 2023-08-11: 60 mL

## 2023-08-11 MED ORDER — HYDROMORPHONE HCL 1 MG/ML IJ SOLN
INTRAMUSCULAR | Status: DC | PRN
  Administered 2023-08-11: .5 mg via INTRAVENOUS

## 2023-08-11 SURGICAL SUPPLY — 13 items
CATH 5FR JL3.5 JR4 ANG PIG MP (CATHETERS) IMPLANT
CATH INFINITI MULTIPACK ANG 4F (CATHETERS) IMPLANT
CATH SWAN GANZ 7F STRAIGHT (CATHETERS) IMPLANT
DEVICE RAD COMP TR BAND LRG (VASCULAR PRODUCTS) IMPLANT
GLIDESHEATH SLEND SS 6F .021 (SHEATH) IMPLANT
GLIDESHEATH SLENDER 7FR .021G (SHEATH) IMPLANT
GUIDEWIRE INQWIRE 1.5J.035X260 (WIRE) IMPLANT
INQWIRE 1.5J .035X260CM (WIRE) ×1
KIT HEART LEFT (KITS) IMPLANT
PACK CARDIAC CATHETERIZATION (CUSTOM PROCEDURE TRAY) ×2 IMPLANT
TRANSDUCER W/STOPCOCK (MISCELLANEOUS) IMPLANT
TUBING ART PRESS 72 MALE/FEM (TUBING) IMPLANT
WIRE EMERALD ST .035X260CM (WIRE) IMPLANT

## 2023-08-11 NOTE — Interval H&P Note (Signed)
History and Physical Interval Note:  08/11/2023 10:55 AM  Amy [name]  has presented today for surgery, with the diagnosis of Heart Failure.  The various methods of treatment have been discussed with the patient and family. After consideration of risks, benefits and other options for treatment, the patient has consented to  Procedure(s): RIGHT/LEFT HEART CATH AND CORONARY ANGIOGRAPHY (N/A) as a surgical intervention.  The patient's history has been reviewed, patient examined, no change in status, stable for surgery.  I have reviewed the patient's chart and labs.  Questions were answered to the patient's satisfaction.     [name]

## 2023-08-12 ENCOUNTER — Encounter (HOSPITAL_COMMUNITY): Payer: Self-pay | Admitting: Internal Medicine

## 2023-08-12 LAB — POCT I-STAT 7, (LYTES, BLD GAS, ICA,H+H)
Acid-base deficit: 6 mmol/L — ABNORMAL HIGH (ref 0.0–2.0)
Bicarbonate: 18.5 mmol/L — ABNORMAL LOW (ref 20.0–28.0)
Calcium, Ion: 0.71 mmol/L — CL (ref 1.15–1.40)
HCT: 34 % — ABNORMAL LOW (ref 36.0–46.0)
Hemoglobin: 11.6 g/dL — ABNORMAL LOW (ref 12.0–15.0)
O2 Saturation: 92 %
Potassium: 2.7 mmol/L — CL (ref 3.5–5.1)
Sodium: 152 mmol/L — ABNORMAL HIGH (ref 135–145)
TCO2: 19 mmol/L — ABNORMAL LOW (ref 22–32)
pCO2 arterial: 30.7 mmHg — ABNORMAL LOW (ref 32–48)
pH, Arterial: 7.387 (ref 7.35–7.45)
pO2, Arterial: 65 mmHg — ABNORMAL LOW (ref 83–108)

## 2023-08-12 LAB — POCT I-STAT EG7
Acid-base deficit: 2 mmol/L (ref 0.0–2.0)
Bicarbonate: 23.3 mmol/L (ref 20.0–28.0)
Calcium, Ion: 0.97 mmol/L — ABNORMAL LOW (ref 1.15–1.40)
HCT: 40 % (ref 36.0–46.0)
Hemoglobin: 13.6 g/dL (ref 12.0–15.0)
O2 Saturation: 63 %
Potassium: 3.4 mmol/L — ABNORMAL LOW (ref 3.5–5.1)
Sodium: 146 mmol/L — ABNORMAL HIGH (ref 135–145)
TCO2: 25 mmol/L (ref 22–32)
pCO2, Ven: 40.5 mmHg — ABNORMAL LOW (ref 44–60)
pH, Ven: 7.368 (ref 7.25–7.43)
pO2, Ven: 34 mmHg (ref 32–45)

## 2023-10-12 ENCOUNTER — Encounter (HOSPITAL_COMMUNITY): Payer: Medicare HMO

## 2023-10-27 ENCOUNTER — Inpatient Hospital Stay (HOSPITAL_COMMUNITY)
Admission: RE | Admit: 2023-10-27 | Discharge: 2023-10-27 | Disposition: A | Discharge status: Home / Self Care | Payer: Medicare HMO | Source: Ambulatory Visit | Attending: Internal Medicine | Admitting: Internal Medicine

## 2023-10-27 ENCOUNTER — Encounter (HOSPITAL_COMMUNITY): Payer: Self-pay

## 2023-10-27 ENCOUNTER — Other Ambulatory Visit (HOSPITAL_COMMUNITY): Payer: Self-pay | Admitting: Internal Medicine

## 2023-10-27 ENCOUNTER — Ambulatory Visit (HOSPITAL_COMMUNITY)
Admission: RE | Admit: 2023-10-27 | Discharge: 2023-10-27 | Disposition: A | Discharge status: Home / Self Care | Payer: Medicare HMO | Source: Ambulatory Visit | Attending: Cardiology | Admitting: Cardiology

## 2023-10-27 VITALS — BP 100/60 | HR 56 | Resp 91 | Wt 160.2 lb

## 2023-10-27 DIAGNOSIS — I350 Nonrheumatic aortic (valve) stenosis: Secondary | ICD-10-CM | POA: Diagnosis not present

## 2023-10-27 DIAGNOSIS — R7303 Prediabetes: Secondary | ICD-10-CM | POA: Diagnosis not present

## 2023-10-27 DIAGNOSIS — I5082 Biventricular heart failure: Secondary | ICD-10-CM | POA: Insufficient documentation

## 2023-10-27 DIAGNOSIS — I252 Old myocardial infarction: Secondary | ICD-10-CM | POA: Diagnosis not present

## 2023-10-27 DIAGNOSIS — Z7902 Long term (current) use of antithrombotics/antiplatelets: Secondary | ICD-10-CM | POA: Insufficient documentation

## 2023-10-27 DIAGNOSIS — I441 Atrioventricular block, second degree: Secondary | ICD-10-CM | POA: Diagnosis not present

## 2023-10-27 DIAGNOSIS — R9431 Abnormal electrocardiogram [ECG] [EKG]: Secondary | ICD-10-CM | POA: Insufficient documentation

## 2023-10-27 DIAGNOSIS — I2510 Atherosclerotic heart disease of native coronary artery without angina pectoris: Secondary | ICD-10-CM | POA: Diagnosis not present

## 2023-10-27 DIAGNOSIS — I493 Ventricular premature depolarization: Secondary | ICD-10-CM

## 2023-10-27 DIAGNOSIS — I5022 Chronic systolic (congestive) heart failure: Secondary | ICD-10-CM | POA: Insufficient documentation

## 2023-10-27 DIAGNOSIS — E669 Obesity, unspecified: Secondary | ICD-10-CM | POA: Diagnosis not present

## 2023-10-27 DIAGNOSIS — I352 Nonrheumatic aortic (valve) stenosis with insufficiency: Secondary | ICD-10-CM | POA: Diagnosis not present

## 2023-10-27 DIAGNOSIS — E785 Hyperlipidemia, unspecified: Secondary | ICD-10-CM | POA: Diagnosis not present

## 2023-10-27 DIAGNOSIS — I082 Rheumatic disorders of both aortic and tricuspid valves: Secondary | ICD-10-CM | POA: Insufficient documentation

## 2023-10-27 DIAGNOSIS — I5042 Chronic combined systolic (congestive) and diastolic (congestive) heart failure: Secondary | ICD-10-CM

## 2023-10-27 DIAGNOSIS — I361 Nonrheumatic tricuspid (valve) insufficiency: Secondary | ICD-10-CM

## 2023-10-27 DIAGNOSIS — I062 Rheumatic aortic stenosis with insufficiency: Secondary | ICD-10-CM | POA: Insufficient documentation

## 2023-10-27 DIAGNOSIS — K219 Gastro-esophageal reflux disease without esophagitis: Secondary | ICD-10-CM | POA: Diagnosis not present

## 2023-10-27 LAB — BASIC METABOLIC PANEL
Anion gap: 13 (ref 5–15)
BUN: 19 mg/dL (ref 8–23)
CO2: 23 mmol/L (ref 22–32)
Calcium: 9.6 mg/dL (ref 8.9–10.3)
Chloride: 101 mmol/L (ref 98–111)
Creatinine, Ser: 0.95 mg/dL (ref 0.44–1.00)
GFR, Estimated: >60 mL/min (ref >60)
Glucose, Bld: 132 mg/dL — ABNORMAL HIGH (ref 70–99)
Potassium: 4 mmol/L (ref 3.5–5.1)
Sodium: 137 mmol/L (ref 135–145)

## 2023-10-27 LAB — BRAIN NATRIURETIC PEPTIDE: B Natriuretic Peptide: 657 pg/mL — ABNORMAL HIGH (ref 0.0–100.0)

## 2023-10-27 NOTE — Progress Notes (Signed)
ADVANCED HF CLINIC NOTE  Primary Care: Noberto Retort, MD HF Cardiologist: Dr. 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    Admitted 4/23 with late presentation inferior STEMI. Not taken for urgent cardiac cath as she completed her MI and had AKI. Course complicated by RV infarct>>cardiogenic shock. 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 EP consulted and felt to be secondary to late presentation MI. Felt conduction would improve and no need for permanent pacemaker. Echo showed EF 30-35%, RV severely dilated with severely reduced function, moderate to severe TR, moderate to severe AS. She was given IV fluids and started on DBA for RV support.  Scr improved and underwent R/LHC showing (on DBA 2.5): 100% RCA, 60% LM, 50% Lcx, 60% p to m LAD, 90% Ramus, RA 19 (with prominent v waves), PA 27/11 (19), PCWP 5, Fick CO 4.1/Index 2.3, PA sat 43%/49%, PAPi 0.8. Palliative consulted and she remained DNR. DBA weaned off and GDMT titrated. Discharged home with HH, weight 180 lbs.  Today she returns for HF follow up. Overall feeling fine. Denies CP, orthopnea or edema. Had a mild cough this week took her PRN Torsemide 10mg  with improvement.  Reports dizziness and fatigue after taking medication. No CP. Torsemide 10mg  1/weeks. Appetite ok. Taking all medications.     Cardiac Studies:  - R/LHC (9/24): EF 55-65%, RA 4, PCWP 5, PVR 4.7, CO/CI (fick) 3.4/2.0, PAPi 7.5. Stable 3v CAD. Low-flow low gradient severe AS.   - Echo (9/24): EF 40-45% inferior/inferolateral AK. Septal flattening. Severe LFLG AS RV moderately dilated/HK Severe TR  - Echo (9/23): EF 40-45% Moderate AS. RV moderately dilated/HK. Severe TR  - Echo (4/23): EF 30-35%, RV severely dilated with severely reduced function, inferolateral akinesis, moderate to severe TR, moderate to severe AS.    - R/LHC (4/23): CAD with interval occlusion of ostial RCA with associate large RV infarct, boderline 60% LM lesion, mild AS, hemodynamics c/w with severe RV failure and severe TR    Ost RCA to Prox RCA lesion is 100% stenosed.   Ost LM lesion is 60% stenosed.   Ost Cx lesion is 50% stenosed.   Prox LAD to Mid LAD lesion is 60% stenosed.   Ramus lesion is 90% stenosed.  On dobutamine 2.5   Ao = 83/53 (67) LV = 92/14 RA = 19 (with prominent v- waves) RV = 28/18 PA = 27/11 (19) PCW = 5 Fick cardiac output/index = 4.1/2.3 PVR = 3.4 WU FA sat = 88% PA sat = 43%, 49% PAPi = 0.8 AoV gradient peak to peak 2.10mmHG on pullback    Past Medical History:  Diagnosis Date   CHF (congestive heart failure) (HCC)    Coronary artery disease    GERD (gastroesophageal reflux disease)    Hyperlipidemia    Obesity    Pre-diabetes    Current Outpatient Medications  Medication Sig Dispense Refill   acetaminophen (TYLENOL) 500 MG tablet Take 500 mg by mouth every 6 (six) hours as needed for mild pain or headache.     aspirin 81 MG EC tablet Take 81 mg by mouth every evening.     atorvastatin (LIPITOR) 80 MG tablet Take 1 tablet (80 mg total) by mouth daily. 30 tablet 0   B Complex-C (B-COMPLEX WITH VITAMIN C) tablet Take 1 tablet by mouth daily.  BORON PO Take 3 mg by mouth daily.     CALCIUM PO Take 2-3 tablets by mouth daily. 333 mg each     Carboxymethylcellulose Sodium (DRY EYE RELIEF OP) Place 1 drop into both eyes 4 (four) times daily as needed (dryness).     cetirizine (ZYRTEC) 10 MG tablet Take 10 mg by mouth daily.     cholecalciferol (VITAMIN D3) 25 MCG (1000 UNIT) tablet Take 1,000 Units by mouth daily.     Chromium (GTF CHROMIUM) 200 MCG TABS Take 200 mcg by mouth daily.     clopidogrel (PLAVIX) 75 MG tablet Take 1 tablet (75 mg total) by mouth daily. 31 tablet 2   Coenzyme Q10 (CO Q-10) 200 MG CAPS Take 200 mg by mouth daily.     DHEA 25 MG tablet Take 25 mg by mouth daily.      digoxin (LANOXIN) 0.125 MG tablet Take 1 tablet (0.125 mg total) by mouth daily. 30 tablet 11   Glucosamine-Chondroitin 750-600 MG TABS Take 1 tablet by mouth 2 (two) times daily.     JARDIANCE 10 MG TABS tablet TAKE 1 TABLET EVERY DAY 90 tablet 3   MAGNESIUM PO Take 40 mg by mouth daily.     meclizine (ANTIVERT) 25 MG tablet Take 25 mg by mouth daily.     Methylsulfonylmethane (MSM) 1000 MG CAPS Take 1,000 mg by mouth daily.     metoprolol succinate (TOPROL XL) 25 MG 24 hr tablet Take 1/2 tab Daily 15 tablet 11   Misc Natural Products (BEET ROOT) 500 MG CAPS Take 500 mg by mouth 3 (three) times daily.     Multiple Vitamin (MULTI-VITAMIN) tablet Take 1 tablet by mouth daily.     Omega 3-6-9 Fatty Acids (OMEGA 3-6-9 PO) Take 1 capsule by mouth daily. Flax seed     Omega-3 Fatty Acids (FISH OIL PO) Take 1 capsule by mouth at bedtime.     OVER THE COUNTER MEDICATION Take 1 capsule by mouth daily. Sea Kelp     OVER THE COUNTER MEDICATION Place 1 Application into both nostrils daily as needed (congestion). Vicks Nasal Sticks     Pramoxine-Dimethicone (GOLD BOND INTENSIVE HEALING EX) Apply 1 application  topically daily as needed (eczema).     torsemide (DEMADEX) 20 MG tablet Take 10 mg by mouth daily as needed (fluid retention.).     Vanadyl Sulfate CRYS Take 10 mg by mouth daily.     vitamin E 180 MG (400 UNITS) capsule Take 400 Units by mouth daily.     Zinc Acetate 25 MG CAPS Take 25 mg by mouth daily.     No current facility-administered medications for this visit.   Allergies  Allergen Reactions   Timolol Shortness Of Breath   Codeine Other (See Comments)    sick   Morphine And Codeine Nausea And Vomiting   Other Other (See Comments)    vicryl sutures - rash/redness, delay in healing   Social History   Socioeconomic History   Marital status: Widowed    Spouse name: Not on file   Number of children: Not on file   Years of education: 12   Highest education level: High school  graduate  Occupational History   Occupation: retired  Tobacco Use   Smoking status: Never   Smokeless tobacco: Never  Vaping Use   Vaping status: Not on file  Substance and Sexual Activity   Alcohol use: No   Drug use: No   Sexual activity: Not Currently  Other Topics Concern   Not on file  Social History Narrative   Not on file   Social Determinants of Health   Financial Resource Strain: Not on file  Food Insecurity: No Food Insecurity (07/24/2022)   Hunger Vital Sign    Worried About Running Out of Food in the Last Year: Never true    Ran Out of Food in the Last Year: Never true  Transportation Needs: No Transportation Needs (07/24/2022)   PRAPARE - Administrator, Civil Service (Medical): No    Lack of Transportation (Non-Medical): No  Physical Activity: Not on file  Stress: Not on file  Social Connections: Not on file  Intimate Partner Violence: Not on file   Family History  Problem Relation Age of Onset   CAD Neg Hx    There were no vitals taken for this visit.  Wt Readings from Last 3 Encounters:  08/11/23 72.6 kg (160 lb)  07/31/23 74.2 kg (163 lb 9.6 oz)  09/19/22 78.1 kg (172 lb 2.9 oz)   PHYSICAL EXAM: General:  Well appearing. No resp difficulty HEENT: normal Neck: supple. no JVD. Carotids 2+ bilat; no bruits. No lymphadenopathy or thryomegaly appreciated. Cor: PMI nondisplaced. Regular rate & rhythm. No rubs, gallops or murmurs. Lungs: clear Abdomen: soft, nontender, nondistended. No hepatosplenomegaly. No bruits or masses. Good bowel sounds. Extremities: no cyanosis, clubbing, rash, edema Neuro: alert & orientedx3, cranial nerves grossly intact. moves all 4 extremities w/o difficulty. Affect pleasant   ECG: SR 73 bpm w frequent PVCs, RBBB ,  (personally reviewed)   ASSESSMENT & PLAN:  1. 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  - LHC (4/23): 100% RCA,  60% LM, 50% Lcx, 60% p to m LAD, 90% Ramus. Manage medically. - LHC (9/24): stable 3v CAD w borderline LM lesion. - No s/s angina - Continue DAPT/statin/beta blocker.   2. Chronic biventricular heart failure: - EF down to 30-35% in 12/22 in setting of COVID-19 infection.  - LV function subsequently recovered, Echo (3/23): EF up to 50-55%  - Echo (4/23): EF 30-35%, RV severely reduced, moderate to severe TR, moderate to severe aortic valve stenosis with mean gradient 12 mmHg and AVA 0.85 cm2 - RHC (4/23): on 2.5 DBA: severe RV failure and severe TR - RA 19 (prominent v waves), PCWP 5, Fick CO 4.1/Index 2.3, PAPi 2.8 - Echo (9/23): EF 40-45% Moderate AS. RV moderately dilated/HK. Severe TR  - Echo (9/24) EF 40-45% inferior/inferolateral AK. Septal flattening. Severe LFLG AS RV moderately dilated/HK Severe TR  - R/LHC (9/24): EF 55-65%, RA 4, PCWP 5, PVR 4.7, CO/CI (fick) 3.4/2.0, PAPi 7.5. AoV mean 26, peak 30, AVA 0.61. Stable 3v CAD (pRCA 100%, borderline LM lesion). Low-flow low gradient severe AS.  - NYHA II. Appears euvolemic on exam. - Continue spiro 12.5 mg daily - Continue Jardiance 10 mg daily - Continue torsemide 10 mg daily PRN. - Stop Toprol XL 12.5 mg daily d/t hypotension and dizziness - Stop digoxin 0.125 mg daily with recovered EF - Off Entresto with hyperkalemia.  - Labs today - Structural Heart Team referral for TAVR and further management of PAH    3. Tricuspid Regurgitation, severe: - Noted on echo 9/24 - RHC 9/24 mild PAH with normal PAPi - If deteriorates can consider TV clip    4. Aortic valve stenosis: - Severe on echo and RHC 9/24 with LFLG physiology - Structural Heart referral  5. Mobitz II second degree AV block: - EP has seen, no indications for pacemaker. It was felt that this would likely recover with time. - ECG today SR 73bpm w frequent PVCs - Zio montioring 2 week   6. Prediabetes: - A1c 6.3%. - Continue SGLT2i  Follow up in 3 months with Dr.  Gala Romney   [place] Katalin Colledge, NP  8:02 AM

## 2023-10-27 NOTE — Patient Instructions (Signed)
STOP Digoxin.  STOP Metoprolol.  Labs done today, your results will be available in MyChart, we will contact you for abnormal readings.  Your provider has recommended that  you wear a Zio Patch for 14 days.  This monitor will record your heart rhythm for our review.  IF you have any symptoms while wearing the monitor please press the button.  If you have any issues with the patch or you notice a red or orange light on it please call the company at [phone].  Once you remove the patch please mail it back to the company as soon as possible so we can get the results.   You have been referred to the Structural heart team.  They will call you to arrange your appointment.  Your physician recommends that you schedule a follow-up appointment in: 3 months (March 2025) ** PLEASE CALL THE OFFICE IN [place] TO ARRANGE YOUR FOLLOW UP APPOINTMENT. **  If you have any questions or concerns before your next appointment please send [place] a message through [place] or call our office at [phone].    TO LEAVE A MESSAGE FOR THE NURSE SELECT OPTION 2, PLEASE LEAVE A MESSAGE INCLUDING: YOUR NAME DATE OF BIRTH CALL BACK NUMBER REASON FOR CALL**this is important as we prioritize the call backs  YOU WILL RECEIVE A CALL BACK THE SAME DAY AS LONG AS YOU CALL BEFORE 4:00 PM  At the [facility], you and your health needs are our priority. As part of our continuing mission to provide you with exceptional heart care, we have created designated Provider Care Teams. These Care Teams include your primary Cardiologist (physician) and Advanced Practice Providers (APPs- Physician Assistants and Nurse Practitioners) who all work together to provide you with the care you need, when you need it.   You may see any of the following providers on your designated Care Team at your next follow up: Dr Arvilla Meres Dr Marca Ancona Dr. Dorthula Nettles Dr. Clearnce Hasten Amy Filbert Schilder, NP Robbie Lis,  [place] [place], [place] Brynda Peon, NP [place] Lee, NP Karle Plumber, PharmD   Please be sure to bring in all your medications bottles to every appointment.    Thank you for choosing [facility]

## 2023-10-30 ENCOUNTER — Telehealth (HOSPITAL_COMMUNITY): Payer: Self-pay | Admitting: *Deleted

## 2023-10-30 MED ORDER — POTASSIUM CHLORIDE ER 10 MEQ PO TBCR: 10.0000 meq | EXTENDED_RELEASE_TABLET | ORAL | 3 refills | Status: AC

## 2023-10-30 NOTE — Telephone Encounter (Signed)
October 30, 2023 Me     10/30/23  3:23 PM Result Note Spoke w/pt, she is aware, agreeable, and verbalized understanding, rx for KCL sent in

## 2023-10-30 NOTE — Telephone Encounter (Signed)
-----   Message from [place] Lee sent at 10/27/2023  4:25 PM EST ----- Fluid marker elevated.  Change Torsemide to 10mg  Monday, Wed, Fri.  Add 10 KCL Monday, Wednesday, Friday

## 2023-11-17 DIAGNOSIS — E2839 Other primary ovarian failure: Secondary | ICD-10-CM | POA: Diagnosis not present

## 2023-11-17 DIAGNOSIS — E119 Type 2 diabetes mellitus without complications: Secondary | ICD-10-CM | POA: Diagnosis not present

## 2023-11-17 DIAGNOSIS — I2510 Atherosclerotic heart disease of native coronary artery without angina pectoris: Secondary | ICD-10-CM | POA: Diagnosis not present

## 2023-11-17 DIAGNOSIS — H8110 Benign paroxysmal vertigo, unspecified ear: Secondary | ICD-10-CM | POA: Diagnosis not present

## 2023-11-17 DIAGNOSIS — I350 Nonrheumatic aortic (valve) stenosis: Secondary | ICD-10-CM | POA: Diagnosis not present

## 2023-11-17 DIAGNOSIS — Z23 Encounter for immunization: Secondary | ICD-10-CM | POA: Diagnosis not present

## 2023-11-17 DIAGNOSIS — E7800 Pure hypercholesterolemia, unspecified: Secondary | ICD-10-CM | POA: Diagnosis not present

## 2023-11-17 DIAGNOSIS — I5042 Chronic combined systolic (congestive) and diastolic (congestive) heart failure: Secondary | ICD-10-CM | POA: Diagnosis not present

## 2023-11-17 DIAGNOSIS — Z0000 Encounter for general adult medical examination without abnormal findings: Secondary | ICD-10-CM | POA: Diagnosis not present

## 2023-11-20 DIAGNOSIS — I493 Ventricular premature depolarization: Secondary | ICD-10-CM | POA: Diagnosis not present

## 2023-11-23 ENCOUNTER — Ambulatory Visit: Payer: Medicare HMO | Admitting: Cardiovascular Disease

## 2023-11-30 ENCOUNTER — Ambulatory Visit: Payer: Medicare HMO | Admitting: Cardiovascular Disease

## 2023-12-21 ENCOUNTER — Ambulatory Visit: Payer: Medicare HMO | Admitting: Cardiovascular Disease

## 2024-01-02 NOTE — Progress Notes (Deleted)
 Patient ID: Amy [name] MRN: [id number] DOB/AGE: 1946/10/08 78 y.o.  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  The patient's medical history is significant for late presentation inferior myocardial infarction sustained in April 2023.  She was treated medically with inotropes.  Her course was complicated by transient second-degree Mobitz type II block.  No pacemaker was indicated and her rhythm abnormalities resolved.  She was started on goal-directed medical therapy with an increase in her ejection fraction from around 30 to 35% to 40 to 45%.  She underwent coronary angiography and right heart catheterization in September.  This showed unchanged burden of coronary artery disease with occluded proximal right coronary artery and moderate disease of the left system.  Her PVR was elevated at 4.7 Woods units however her PA pulsatility index was 7.5 and her mean PA pressure was 21  mmHg.  Seen in the advanced heart failure clinic in December.  At that point in time she endorsed NYHA class II symptoms and was euvolemic on exam.  Her Toprol and digoxin was were stopped.  Due to low-flow low gradient severe aortic stenosis on her most recent echocardiogram she is referred for further recommendations.         Past Medical History:  Diagnosis Date   CHF (congestive heart failure) (HCC)    Coronary artery disease    GERD (gastroesophageal reflux disease)    Hyperlipidemia    Obesity    Pre-diabetes     Past Surgical History:  Procedure Laterality Date   CARDIAC CATHETERIZATION     CARPAL TUNNEL RELEASE Bilateral    FEMUR FRACTURE SURGERY     REPLACEMENT TOTAL KNEE BILATERAL     RIGHT/LEFT HEART CATH AND CORONARY ANGIOGRAPHY N/A 10/22/2021   Procedure: RIGHT/LEFT HEART CATH AND CORONARY ANGIOGRAPHY;  Surgeon: Lyn Records, MD;  Location: [facility];  Service: Cardiovascular;  Laterality: N/A;   RIGHT/LEFT HEART CATH AND CORONARY ANGIOGRAPHY N/A 02/28/2022   Procedure: RIGHT/LEFT HEART CATH AND CORONARY ANGIOGRAPHY;  Surgeon: Dolores Patty, MD;  Location: [facility];  Service: Cardiovascular;  Laterality: N/A;   RIGHT/LEFT HEART CATH AND CORONARY ANGIOGRAPHY N/A 08/11/2023   Procedure: RIGHT/LEFT HEART CATH AND CORONARY ANGIOGRAPHY;  Surgeon: Dolores Patty, MD;  Location: [facility];  Service: Cardiovascular;  Laterality: N/A;   SHOULDER SURGERY Right    rotator cuff    Family History  Problem Relation Age of Onset   CAD Neg Hx     Social History   Socioeconomic History   Marital status: Widowed  Spouse name: Not on file   Number of children: Not on file   Years of education: 12   Highest education level: High school graduate  Occupational History   Occupation: retired  Tobacco Use   Smoking status: Never   Smokeless tobacco: Never  Vaping Use   Vaping status: Not on file  Substance and Sexual Activity   Alcohol  use: No   Drug use: No   Sexual activity: Not Currently  Other Topics Concern   Not on file  Social History Narrative   Not on file   Social Drivers of Health   Financial Resource Strain: Not on file  Food Insecurity: No Food Insecurity (07/24/2022)   Hunger Vital Sign    Worried About Running Out of Food in the Last Year: Never true    Ran Out of Food in the Last Year: Never true  Transportation Needs: No Transportation Needs (07/24/2022)   PRAPARE - Administrator, Civil Service (Medical): No    Lack of Transportation (Non-Medical): No  Physical Activity: Not on file  Stress: Not on file  Social Connections: Not on file  Intimate Partner Violence: Not on file     Prior to Admission medications   Medication Sig Start Date End Date Taking? Authorizing Provider  acetaminophen (TYLENOL) 500 MG tablet Take 500 mg by mouth every 6 (six) hours as needed for mild pain or headache.    [provider]  aspirin 81 MG EC tablet Take 81 mg by mouth every evening.    [provider]  atorvastatin (LIPITOR) 80 MG tablet Take 1 tablet (80 mg total) by mouth daily. 10/24/21   Tyrone Nine, MD  B Complex-C (B-COMPLEX WITH VITAMIN C) tablet Take 1 tablet by mouth daily. 11/21/15   [provider]  BORON PO Take 3 mg by mouth daily.    [provider]  CALCIUM PO Take 2-3 tablets by mouth daily. 333 mg each    [provider]  Carboxymethylcellulose Sodium (DRY EYE RELIEF OP) Place 1 drop into both eyes 4 (four) times daily as needed (dryness).    [provider]  cetirizine (ZYRTEC) 10 MG tablet Take 10 mg by mouth daily.    [provider]  cholecalciferol (VITAMIN D3) 25 MCG (1000 UNIT) tablet Take 1,000 Units by mouth daily.    [provider]  Chromium (GTF CHROMIUM) 200 MCG TABS Take 200 mcg by mouth daily.    [provider]  clopidogrel (PLAVIX) 75 MG tablet Take 1 tablet (75 mg total) by mouth daily.  04/06/22   Cyndi Bender, NP  Coenzyme Q10 (CO Q-10) 200 MG CAPS Take 200 mg by mouth daily.    [provider]  DHEA 25 MG tablet Take 25 mg by mouth daily.    [provider]  Glucosamine-Chondroitin 750-600 MG TABS Take 1 tablet by mouth 2 (two) times daily.    [provider]  JARDIANCE 10 MG TABS tablet TAKE 1 TABLET EVERY DAY 04/28/23   Andrey Farmer, PA-C  meclizine (ANTIVERT) 25 MG tablet Take 25 mg by mouth daily. 09/12/21   [provider]  Methylsulfonylmethane (MSM) 1000 MG CAPS Take 1,000 mg by mouth daily.    [provider]  Misc Natural Products (BEET ROOT) 500 MG CAPS Take 500 mg by mouth 3 (three) times daily.    [provider]  Multiple Vitamin (MULTI-VITAMIN) tablet Take 1 tablet by mouth daily.    [provider]  Omega 3-6-9 Fatty Acids (OMEGA 3-6-9 PO) Take 1 capsule by mouth daily. Flax seed    [provider]  Omega-3 Fatty Acids (FISH OIL PO) Take 1 capsule by mouth at bedtime.    [provider]  OVER THE COUNTER MEDICATION Take 1 capsule by mouth daily. Sea Mining engineer, Historical, MD  OVER THE COUNTER MEDICATION Place 1 Application into both nostrils daily as needed (congestion). Vicks Nasal Sticks    [provider]  potassium chloride (KLOR-CON) 10 MEQ tablet Take 1 tablet (10 mEq total) by mouth 3 (three) times a week. 10/30/23   Lee, [place], NP  Pramoxine-Dimethicone (GOLD BOND INTENSIVE HEALING EX) Apply 1 application  topically daily as needed (eczema).    [provider]  torsemide (DEMADEX) 20 MG tablet Take 10 mg by mouth daily as needed (fluid retention.).    [provider]  Vanadyl Sulfate CRYS Take 10 mg by mouth daily.    [provider]  vitamin E 180 MG (400 UNITS) capsule Take 400 Units by mouth daily.    [provider]    Allergies  Allergen Reactions   Timolol Shortness Of Breath   Codeine Other (See Comments)     sick   Morphine And Codeine Nausea And Vomiting   Other Other (See Comments)    vicryl sutures - rash/redness, delay in healing    REVIEW OF SYSTEMS:  General: no fevers/chills/night sweats Eyes: no blurry vision, diplopia, or amaurosis ENT: no sore throat or hearing loss Resp: no cough, wheezing, or hemoptysis CV: no edema or palpitations GI: no abdominal pain, nausea, vomiting, diarrhea, or constipation GU: no dysuria, frequency, or hematuria Skin: no rash Neuro: no headache, numbness, tingling, or weakness of extremities Musculoskeletal: no joint pain or swelling Heme: no bleeding, DVT, or easy bruising Endo: no polydipsia or polyuria  There were no vitals taken for this visit.  PHYSICAL EXAM: GEN:  AO x 3 in no acute distress HEENT: normal Dentition: Normal*** Neck: JVP normal. +2***carotid upstrokes without bruits. No thyromegaly. Lungs: equal expansion, clear bilaterally CV: Apex is discrete and nondisplaced, RRR without murmur or gallop*** Abd: soft, non-tender, non-distended; no bruit; positive bowel sounds Ext: no edema, ecchymoses, or cyanosis Vascular: 2+ femoral pulses, 2+ radial pulses       Skin: warm and dry without rash Neuro: CN II-XII grossly intact; motor and sensory grossly intact    DATA AND STUDIES:  EKG: EKG from December 2024 demonstrates sinus rhythm with inferior infarction pattern and right bundle branch block with PVCs  EKG Interpretation Date/Time:    Ventricular Rate:    PR Interval:    QRS Duration:    QT Interval:    QTC Calculation:   R Axis:      Text Interpretation:          Cardiac Studies & Procedures   ______________________________________________________________________________________________ CARDIAC CATHETERIZATION  CARDIAC CATHETERIZATION 08/11/2023  Narrative   Ost LM lesion is 60% stenosed.   Prox LAD to Mid LAD lesion is 60% stenosed.   Ost Cx lesion is 50% stenosed.   Ramus lesion is 90% stenosed.   Prox  RCA lesion is 100% stenosed.   The left ventricular ejection fraction is 55-65% by visual estimate.  Findings:  Ao = 105/49 (71) LV =  140/15 RA = 4 RV = 39/8 PA = 37/7 (21) PCW = 5 Fick cardiac output/index = 3.4/2.0 PVR = 4.7 WU FA sat = 92% PA sat =  63% PAPi = 7.5 AoV mean gradient AoV Peak to Peak gradient Calculated AVA 0.61cm2  Assessment: 1. Stable 3v CAD with borderline LM lesion (ventricularizes on catheter placement) 2. EF 60-65% 3. Very mild PAH with normal PAPI 4. Likely low-flow, low-gradient severe AS  Plan/Discussion:  Will review with TAVR team.  Arvilla Meres, MD 11:56 AM  Findings Coronary Findings Diagnostic  Dominance: Right  Left Main Ost LM lesion is 60% stenosed.  Left Anterior Descending Prox LAD to Mid LAD lesion is 60% stenosed.  Ramus Intermedius Ramus lesion is 90% stenosed.  Left Circumflex Ost Cx lesion is 50% stenosed.  First Obtuse Marginal Branch  Right Coronary Artery Collaterals Dist RCA filled by collaterals from Ost RCA.  Prox RCA lesion is 100% stenosed.  Right Posterior Atrioventricular Artery Collaterals RPAV filled by collaterals from 1st Sept.  Intervention  No interventions have been documented.   CARDIAC CATHETERIZATION  CARDIAC CATHETERIZATION 02/28/2022  Narrative   Ost RCA to Prox RCA lesion is 100% stenosed.   Ost LM lesion is 60% stenosed.   Ost Cx lesion is 50% stenosed.   Prox LAD to Mid LAD lesion is 60% stenosed.   Ramus lesion is 90% stenosed.  Findings:  On dobutamine 2.5  Ao = 83/53 (67) LV = 92/14 RA = 19 (with prominent v- waves) RV = 28/18 PA = 27/11 (19) PCW = 5 Fick cardiac output/index = 4.1/2.3 PVR = 3.4 WU FA sat = 88% PA sat = 43%, 49% PAPi = 0.8 AoV gradient peak to peak 2.24mmHG on pullback  Assessment: 1. 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 Borderline 60% LM lesion 3. Mild AS 4. Hemodynamics c/w  with severe RV failure and severe TR  Plan/Discussion:  Continue treatment of RV infarct. Will review LM lesion with IC team. Currently stable and no urgent need for revascularization.  Arvilla Meres, MD 11:11 AM  Findings Coronary Findings Diagnostic  Dominance: Right  Left Main Ost LM lesion is 60% stenosed.  Left Anterior Descending Prox LAD to Mid LAD lesion is 60% stenosed.  Ramus Intermedius Ramus lesion is 90% stenosed.  Left Circumflex Ost Cx lesion is 50% stenosed.  First Obtuse Marginal Branch  Right Coronary Artery Ost RCA to Prox RCA lesion is 100% stenosed.  Intervention  No interventions have been documented.     ECHOCARDIOGRAM  ECHOCARDIOGRAM COMPLETE 07/31/2023  Narrative ECHOCARDIOGRAM REPORT    Patient Name:   Amy [name] Date of Exam: 07/31/2023 Medical Rec #:  [id number]      Height:       57.0 in Accession #:    [id number]     Weight:       172.2 lb Date of Birth:  11-25-1945      BSA:          1.688 m Patient Age:    77 years       BP:           142/90 mmHg Patient Gender: F              HR:           73 bpm. Exam Location:  Outpatient  Procedure: 2D Echo, Cardiac Doppler and Color Doppler  Indications:    CHF  History:        Patient has prior history of Echocardiogram examinations, most recent 07/24/2022. CHF and Cardiomyopathy; Risk Factors:Hypertension, Diabetes and Dyslipidemia.  Sonographer:    Actuary  Referring Phys: [id number] DANIEL R BENSIMHON  IMPRESSIONS   1. Left ventricular ejection fraction, by estimation, is 40 to 45%. The left ventricle has mildly decreased function. The left ventricle demonstrates global hypokinesis. Left ventricular diastolic parameters are consistent with Grade I diastolic dysfunction (impaired relaxation). There is the interventricular septum is flattened in diastole ('D' shaped left ventricle), consistent with right ventricular volume overload. There is severe hypokinesis of the left  ventricular, entire inferoseptal wall and inferior wall. 2. Right ventricular systolic function is moderately reduced. The right ventricular size is mildly enlarged. There is moderately elevated pulmonary artery systolic pressure. 3. Left atrial size was mildly dilated. 4. Right atrial size was mildly dilated. 5. The mitral valve is degenerative. No evidence of mitral valve regurgitation. No evidence of mitral stenosis. Moderate mitral annular calcification. 6. The tricuspid valve is abnormal. Tricuspid valve regurgitation is severe. 7. The aortic valve is tricuspid. There is moderate calcification of the aortic valve. There is severe thickening of the aortic valve. Aortic valve regurgitation is not visualized. Severe aortic valve stenosis. Aortic valve mean gradient measures 27.0 mmHg. Aortic valve Vmax measures 3.41 m/s. Aortic valve acceleration time measures 106 msec. 8. The inferior vena cava is normal in size with greater than 50% respiratory variability, suggesting right atrial pressure of 3 mmHg.  Comparison(s): Prior images reviewed side by side. The left ventricular function is unchanged. The right ventricular systolic function has improved slightly. Current findings are consistent with low flow low gradient severe aortic stenosis.  FINDINGS Left Ventricle: Left ventricular ejection fraction, by estimation, is 40 to 45%. The left ventricle has mildly decreased function. The left ventricle demonstrates global hypokinesis. Severe hypokinesis of the left ventricular, entire inferoseptal wall and inferior wall. The left ventricular internal cavity size was normal in size. There is no left ventricular hypertrophy. The interventricular septum is flattened in diastole ('D' shaped left ventricle), consistent with right ventricular volume overload. Left ventricular diastolic parameters are consistent with Grade I diastolic dysfunction (impaired relaxation). Normal left ventricular filling  pressure.  Right Ventricle: The right ventricular size is mildly enlarged. Right vetricular wall thickness was not well visualized. Right ventricular systolic function is moderately reduced. There is moderately elevated pulmonary artery systolic pressure. The tricuspid regurgitant velocity is 3.33 m/s, and with an assumed right atrial pressure of 3 mmHg, the estimated right ventricular systolic pressure is 47.4 mmHg.  Left Atrium: Left atrial size was mildly dilated.  Right Atrium: Right atrial size was mildly dilated.  Pericardium: There is no evidence of pericardial effusion.  Mitral Valve: The mitral valve is degenerative in appearance. Moderate mitral annular calcification. No evidence of mitral valve regurgitation. No evidence of mitral valve stenosis. MV peak gradient, 4.8 mmHg. The mean mitral valve gradient is 2.0 mmHg.  Tricuspid Valve: The tricuspid valve is abnormal. Tricuspid valve regurgitation is severe.  Aortic Valve: The aortic valve is tricuspid. There is moderate calcification of the aortic valve. There is severe thickening of the aortic valve. Aortic valve regurgitation is not visualized. Severe aortic stenosis is present. Aortic valve mean gradient measures 27.0 mmHg. Aortic valve peak gradient measures 46.6 mmHg. Aortic valve area, by VTI measures 0.76 cm.  Pulmonic Valve: The pulmonic valve was normal in structure. Pulmonic valve regurgitation is not visualized. No evidence of pulmonic stenosis.  Aorta: The aortic root and ascending aorta are structurally normal, with no evidence of dilitation.  Venous: The inferior vena cava is normal in size with greater than 50% respiratory variability, suggesting right atrial  pressure of 3 mmHg.  IAS/Shunts: The interatrial septum was not well visualized.   LEFT VENTRICLE PLAX 2D LVIDd:         5.10 cm     Diastology LVIDs:         4.40 cm     LV e' medial:    6.00 cm/s LV PW:         0.90 cm     LV E/e' medial:  13.1 LV  IVS:        1.00 cm     LV e' lateral:   8.00 cm/s LVOT diam:     2.14 cm     LV E/e' lateral: 9.8 LV SV:         59 LV SV Index:   35 LVOT Area:     3.60 cm  LV Volumes (MOD) LV vol d, MOD A2C: 81.6 ml LV vol d, MOD A4C: 79.5 ml LV vol s, MOD A2C: 56.2 ml LV vol s, MOD A4C: 53.3 ml LV SV MOD A2C:     25.4 ml LV SV MOD A4C:     79.5 ml LV SV MOD BP:      24.7 ml  RIGHT VENTRICLE RV Basal diam:  3.70 cm RV Mid diam:    3.90 cm RV S prime:     10.10 cm/s TAPSE (M-mode): 1.5 cm  LEFT ATRIUM             Index        RIGHT ATRIUM           Index LA diam:        3.10 cm 1.84 cm/m   RA Area:     15.60 cm LA Vol (A2C):   46.6 ml 27.61 ml/m  RA Volume:   36.80 ml  21.80 ml/m LA Vol (A4C):   25.6 ml 15.17 ml/m LA Biplane Vol: 34.8 ml 20.62 ml/m AORTIC VALVE                     PULMONIC VALVE AV Area (Vmax):    0.87 cm      PV Vmax:          0.83 m/s AV Area (Vmean):   0.88 cm      PV Peak grad:     2.8 mmHg AV Area (VTI):     0.76 cm      PR End Diast Vel: 3.50 msec AV Vmax:           341.33 cm/s   RVOT Peak grad:   1 mmHg AV Vmean:          244.000 cm/s AV VTI:            0.778 m AV Peak Grad:      46.6 mmHg AV Mean Grad:      27.0 mmHg LVOT Vmax:         82.59 cm/s LVOT Vmean:        59.708 cm/s LVOT VTI:          0.164 m LVOT/AV VTI ratio: 0.21  AORTA Ao Sinus diam: 2.40 cm Ao STJ diam:   2.2 cm Ao Asc diam:   2.80 cm  MITRAL VALVE                TRICUSPID VALVE MV Area (PHT): 3.86 cm     TR Peak grad:   44.4 mmHg MV Peak grad:  4.8 mmHg     TR  Vmax:        333.00 cm/s MV Mean grad:  2.0 mmHg MV Vmax:       1.10 m/s     SHUNTS MV Vmean:      65.7 cm/s    Systemic VTI:  0.16 m MV E velocity: 78.80 cm/s   Systemic Diam: 2.14 cm MV A velocity: 106.00 cm/s MV E/A ratio:  0.74  Mihai Croitoru MD Electronically signed by Thurmon Fair MD Signature Date/Time: 07/31/2023/3:45:49 PM    Final    MONITORS  LONG TERM MONITOR (3-14 DAYS)  11/24/2023  Narrative Patch Wear Time:  13 days and 23 hours (2024-12-10T11:48:08-499 to 2024-12-24T11:48:12-498)  1. Sinus rhythm -  avg HR of 85 bpm. 2. One run of nonsustained Ventricular Tachycardia occurred lasting 4 beats with a max rate of 164 bpm (avg 131 bpm). 3. Two 2 early am pauses occurred (back to back), the longest  lasting 4.8 secs (12 bpm) the other was 3.4 sec 4. Frequent PVCs (9.1%, [id number]), 5. Ventricular Bigeminy and Trigeminy were present. Inverted QRS complexes possibly due to inverted placement of device.  Arvilla Meres, MD 4:03 PM       ______________________________________________________________________________________________      07/31/2023: Platelets 237 08/11/2023: Hemoglobin 15.0; Hemoglobin 13.6 10/27/2023: B Natriuretic Peptide 657.0; BUN 19; Creatinine, Ser 0.95; Potassium 4.0; Sodium 137   STS RISK CALCULATOR: Pending  NHYA CLASS: ***    ASSESSMENT AND PLAN:   1. Nonrheumatic aortic valve stenosis   2. Chronic combined systolic and diastolic heart failure (HCC)   3. Right ventricular dysfunction   4. RBBB   5. Nonrheumatic tricuspid valve regurgitation   6. Coronary artery disease involving native coronary artery of native heart without angina pectoris   7. Primary hypertension   8. Hyperlipidemia LDL goal <55   9. Aortic atherosclerosis (HCC)   10. Prediabetes   11. BMI 34.0-34.9,adult     Aortic stenosis:*** Systolic and diastolic heart failure: Managed by advanced heart failure; continue Jardiance 10 mg daily, torsemide 20 mg a day; off of Entresto due to hyperkalemia and Toprol due to hypotension and dizziness. RV dysfunction: PA pulsatility index normal and PVR not prohibitive. Right bundle branch block: Presence of right bundle branch block patient's risk for permanent pacemaker for TAVR procedure is pursued.  A TAVR protocol CTA and will help adjudicate the risk this. Tricuspid regurgitation:*** Coronary artery disease:  Continue medical management with aspirin 81 mg daily, Lipitor 80 mg, and stop Plavix 75 mg daily.*** Hypertension:*** Hyperlipidemia: Check lipid panel, LFTs, LP(a) today.  Given history of acute coronary syndrome goal LDL is less than 55. Aortic atherosclerosis: Continue aspirin 81 mg Lipitor 80 mg.  Strict blood pressure control. Prediabetes: Continue Jardiance 10 mg daily. Elevated BMI: And exercise modification if develops formal diagnosis of diabetes consider GLP-1 receptor agonist therapy.  I have personally reviewed the patients imaging data as summarized above.  I have reviewed the natural history of aortic stenosis with the patient and family members who are present today. We have discussed the limitations of medical therapy and the poor prognosis associated with symptomatic aortic stenosis. We have also reviewed potential treatment options, including palliative medical therapy, conventional surgical aortic valve replacement, and transcatheter aortic valve replacement. We discussed treatment options in the context of this patient's specific comorbid medical conditions.   All of the patient's questions were answered today. Will make further recommendations based on the results of studies outlined above.   I spent *** minutes reviewing all clinical  data during and prior to this visit including all relevant imaging studies, laboratories, clinical information from other health systems and prior notes from both Cardiology and other specialties, interviewing the patient, conducting a complete physical examination, and coordinating care in order to formulate a comprehensive and personalized evaluation and treatment plan.   Orbie Pyo, MD  01/02/2024 9:24 AM    [facility] [address] [place], [place], [place]  [address] Phone: [phone]; Fax: [phone]

## 2024-01-05 ENCOUNTER — Institutional Professional Consult (permissible substitution): Payer: Medicare HMO | Admitting: Cardiovascular Disease

## 2024-01-08 ENCOUNTER — Ambulatory Visit: Payer: Medicare HMO | Admitting: Internal Medicine

## 2024-01-08 DIAGNOSIS — I4510 Unspecified right bundle-branch block: Secondary | ICD-10-CM

## 2024-01-08 DIAGNOSIS — E785 Hyperlipidemia, unspecified: Secondary | ICD-10-CM

## 2024-01-08 DIAGNOSIS — I519 Heart disease, unspecified: Secondary | ICD-10-CM

## 2024-01-08 DIAGNOSIS — R7303 Prediabetes: Secondary | ICD-10-CM

## 2024-01-08 DIAGNOSIS — I2510 Atherosclerotic heart disease of native coronary artery without angina pectoris: Secondary | ICD-10-CM

## 2024-01-08 DIAGNOSIS — I5042 Chronic combined systolic (congestive) and diastolic (congestive) heart failure: Secondary | ICD-10-CM

## 2024-01-08 DIAGNOSIS — I361 Nonrheumatic tricuspid (valve) insufficiency: Secondary | ICD-10-CM

## 2024-01-08 DIAGNOSIS — I350 Nonrheumatic aortic (valve) stenosis: Secondary | ICD-10-CM

## 2024-01-08 DIAGNOSIS — I10 Essential (primary) hypertension: Secondary | ICD-10-CM

## 2024-01-08 DIAGNOSIS — I700 Atherosclerosis of aorta: Secondary | ICD-10-CM

## 2024-01-08 DIAGNOSIS — Z6834 Body mass index (BMI) 34.0-34.9, adult: Secondary | ICD-10-CM

## 2024-02-01 NOTE — Progress Notes (Signed)
 Patient ID: Amy [name] MRN: [id number] DOB/AGE: Jun 22, 1946 78 y.o.  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 The patient is a 78 year old female with the above listed medical problems referred for conditions regarding the patient's low-flow low gradient severe aortic stenosis.  The patient's medical history is significant for late presentation inferior myocardial infarction sustained in April 2023.  She was treated medically with inotropes.  Her course was complicated by transient second-degree Mobitz type II block.  No pacemaker was indicated and her rhythm abnormalities resolved.  She was started on goal-directed medical therapy with an increase in her ejection fraction from around 30 to 35% to 40 to 45%.  She underwent coronary angiography and right heart catheterization in September.  This showed unchanged burden of coronary artery disease with occluded proximal right coronary artery and moderate disease of the left system.  Her PVR was elevated at 4.7 Woods units however her PA pulsatility index  was 7.5 and her mean PA pressure was 21 mmHg.  Seen in the advanced heart failure clinic in December.  At that point in time she endorsed NYHA class II symptoms and was euvolemic on exam.  Her Toprol and digoxin was were stopped.  Due to low-flow low gradient severe aortic stenosis on her most recent echocardiogram she is referred for further recommendations.  No chest pain, but she experiences occasional dizziness, particularly in the mornings when transitioning from sitting to standing. This dizziness has been present since the early 1990s and has not worsened. No peripheral edema or orthopnea, although she props herself up in bed.  Her daily activities include cooking, doing laundry, and cleaning, but she does not perform all tasks in one day. She rests when feeling tired and attributes any recent changes in breathing or fatigue to seasonal allergies due to pine and oak trees in her yard. She feels she can do more now than a year ago, as she can handle heavier objects more easily.  She takes torsemide, initially prescribed at 20 mg daily, but adjusted to 10 mg three times a week due to side effects. She also takes potassium on alternate days to manage the side effects of torsemide. She previously took metoprolol and digoxin but discontinued them due to side effects, including excessive sleepiness.  She denies any dental symptoms.       Past Medical History:  Diagnosis Date   CHF (congestive heart failure) (HCC)    Coronary artery disease    GERD (gastroesophageal reflux disease)    Hyperlipidemia    Obesity    Pre-diabetes     Past Surgical History:  Procedure Laterality Date   CARDIAC CATHETERIZATION  CARPAL TUNNEL RELEASE Bilateral    FEMUR FRACTURE SURGERY     REPLACEMENT TOTAL KNEE BILATERAL     RIGHT/LEFT HEART CATH AND CORONARY ANGIOGRAPHY N/A 10/22/2021   Procedure: RIGHT/LEFT HEART CATH AND CORONARY ANGIOGRAPHY;  Surgeon: Lyn Records, MD;  Location: [facility];   Service: Cardiovascular;  Laterality: N/A;   RIGHT/LEFT HEART CATH AND CORONARY ANGIOGRAPHY N/A 02/28/2022   Procedure: RIGHT/LEFT HEART CATH AND CORONARY ANGIOGRAPHY;  Surgeon: Dolores Patty, MD;  Location: [facility];  Service: Cardiovascular;  Laterality: N/A;   RIGHT/LEFT HEART CATH AND CORONARY ANGIOGRAPHY N/A 08/11/2023   Procedure: RIGHT/LEFT HEART CATH AND CORONARY ANGIOGRAPHY;  Surgeon: Dolores Patty, MD;  Location: [facility];  Service: Cardiovascular;  Laterality: N/A;   SHOULDER SURGERY Right    rotator cuff    Family History  Problem Relation Age of Onset   CAD Neg Hx     Social History   Socioeconomic History   Marital status: Widowed    Spouse name: Not on file   Number of children: Not on file   Years of education: 12   Highest education level: High school graduate  Occupational History   Occupation: retired  Tobacco Use   Smoking status: Never   Smokeless tobacco: Never  Vaping Use   Vaping status: Not on file  Substance and Sexual Activity   Alcohol use: No   Drug use: No   Sexual activity: Not Currently  Other Topics Concern   Not on file  Social History Narrative   Not on file   Social Drivers of Health   Financial Resource Strain: Not on file  Food Insecurity: No Food Insecurity (07/24/2022)   Hunger Vital Sign    Worried About Running Out of Food in the Last Year: Never true    Ran Out of Food in the Last Year: Never true  Transportation Needs: No Transportation Needs (07/24/2022)   PRAPARE - Administrator, Civil Service (Medical): No    Lack of Transportation (Non-Medical): No  Physical Activity: Not on file  Stress: Not on file  Social Connections: Not on file  Intimate Partner Violence: Not on file     Prior to Admission medications   Medication Sig Start Date End Date Taking? Authorizing Provider  acetaminophen (TYLENOL) 500 MG tablet Take 500 mg by mouth every 6 (six) hours as needed for mild pain or  headache.    [provider]  aspirin 81 MG EC tablet Take 81 mg by mouth every evening.    [provider]  atorvastatin (LIPITOR) 80 MG tablet Take 1 tablet (80 mg total) by mouth daily. 10/24/21   Tyrone Nine, MD  B Complex-C (B-COMPLEX WITH VITAMIN C) tablet Take 1 tablet by mouth daily. 11/21/15   [provider]  BORON PO Take 3 mg by mouth daily.    [provider]  CALCIUM PO Take 2-3 tablets by mouth daily. 333 mg each    [provider]  Carboxymethylcellulose Sodium (DRY EYE RELIEF OP) Place 1 drop into both eyes 4 (four) times daily as needed (dryness).    [provider]  cetirizine (ZYRTEC) 10 MG tablet Take 10 mg by mouth daily.    [provider]  cholecalciferol (VITAMIN D3) 25 MCG (1000 UNIT) tablet Take 1,000 Units by mouth daily.    [provider]  Chromium (GTF CHROMIUM) 200 MCG TABS Take 200 mcg by mouth daily.  [provider]  clopidogrel (PLAVIX) 75 MG tablet Take 1 tablet (75 mg total) by mouth daily. 04/06/22   Cyndi Bender, NP  Coenzyme Q10 (CO Q-10) 200 MG CAPS Take 200 mg by mouth daily.    [provider]  DHEA 25 MG tablet Take 25 mg by mouth daily.    [provider]  Glucosamine-Chondroitin 750-600 MG TABS Take 1 tablet by mouth 2 (two) times daily.    [provider]  JARDIANCE 10 MG TABS tablet TAKE 1 TABLET EVERY DAY 04/28/23   Andrey Farmer, PA-C  meclizine (ANTIVERT) 25 MG tablet Take 25 mg by mouth daily. 09/12/21   [provider]  Methylsulfonylmethane (MSM) 1000 MG CAPS Take 1,000 mg by mouth daily.    [provider]  Misc Natural Products (BEET ROOT) 500 MG CAPS Take 500 mg by mouth 3 (three) times daily.    [provider]  Multiple Vitamin (MULTI-VITAMIN) tablet Take 1 tablet by mouth daily.    [provider]  Omega 3-6-9 Fatty Acids (OMEGA 3-6-9 PO) Take 1 capsule by mouth daily. Flax seed    [provider]  Omega-3 Fatty Acids (FISH OIL PO) Take 1 capsule by mouth at bedtime.    [provider]  OVER THE COUNTER MEDICATION Take 1 capsule by mouth daily. Sea Mining engineer, Historical, MD  OVER THE COUNTER MEDICATION Place 1 Application into both nostrils daily as needed (congestion). Vicks Nasal Sticks    [provider]  potassium chloride (KLOR-CON) 10 MEQ tablet Take 1 tablet (10 mEq total) by mouth 3 (three) times a week. 10/30/23   Lee, [place], NP  Pramoxine-Dimethicone (GOLD BOND INTENSIVE HEALING EX) Apply 1 application  topically daily as needed (eczema).    [provider]  torsemide (DEMADEX) 20 MG tablet Take 10 mg by mouth daily as needed (fluid retention.).    [provider]  Vanadyl Sulfate CRYS Take 10 mg by mouth daily.    [provider]  vitamin E 180 MG (400 UNITS) capsule Take 400 Units by mouth daily.    [provider]    Allergies  Allergen Reactions   Timolol Shortness Of Breath   Codeine Other (See Comments)    sick   Morphine And Codeine Nausea And Vomiting   Other Other (See Comments)    vicryl sutures - rash/redness, delay in healing    REVIEW OF SYSTEMS:  General: no fevers/chills/night sweats Eyes: no blurry vision, diplopia, or amaurosis ENT: no sore throat or hearing loss Resp: no cough, wheezing, or hemoptysis CV: no edema or palpitations GI: no abdominal pain, nausea, vomiting, diarrhea, or constipation GU: no dysuria, frequency, or hematuria Skin: no rash Neuro: no headache, numbness, tingling, or weakness of extremities Musculoskeletal: no joint pain or swelling Heme: no bleeding, DVT, or easy bruising Endo: no polydipsia or polyuria  BP 120/62 (Patient Position: Sitting, Cuff Size: Normal)   Pulse 62   Ht 4' 9.5" (1.461 m)   Wt 156 lb (70.8 kg)   SpO2 95%   BMI 33.17 kg/m   PHYSICAL EXAM: GEN:  AO x 3 in no acute distress HEENT: normal Dentition: Normal Neck: JVP  normal. +2 carotid upstrokes without bruits. No thyromegaly. Lungs: equal expansion, clear bilaterally CV: Apex is discrete and nondisplaced, RRR with 3/6 SEM LUSB Abd: soft, non-tender, non-distended; no bruit; positive bowel sounds Ext: no edema, ecchymoses, or cyanosis Vascular: 2+ femoral pulses, 2+ radial pulses  Skin: warm and dry without rash Neuro: CN II-XII grossly intact; motor and sensory grossly intact    DATA AND STUDIES:  EKG: EKG from December 2024 demonstrates sinus rhythm with inferior infarction pattern and right bundle branch block with PVCs  EKG Interpretation Date/Time:    Ventricular Rate:    PR Interval:    QRS Duration:    QT Interval:    QTC Calculation:   R Axis:      Text Interpretation:          Cardiac Studies & Procedures   ______________________________________________________________________________________________ CARDIAC CATHETERIZATION  CARDIAC CATHETERIZATION 08/11/2023  Narrative   Ost LM lesion is 60% stenosed.   Prox LAD to Mid LAD lesion is 60% stenosed.   Ost Cx lesion is 50% stenosed.   Ramus lesion is 90% stenosed.   Prox RCA lesion is 100% stenosed.   The left ventricular ejection fraction is 55-65% by visual estimate.  Findings:  Ao = 105/49 (71) LV =  140/15 RA = 4 RV = 39/8 PA = 37/7 (21) PCW = 5 Fick cardiac output/index = 3.4/2.0 PVR = 4.7 WU FA sat = 92% PA sat = 63% PAPi = 7.5 AoV mean gradient AoV Peak to Peak gradient Calculated AVA 0.61cm2  Assessment: 1. Stable 3v CAD with borderline LM lesion (ventricularizes on catheter placement) 2. EF 60-65% 3. Very mild PAH with normal PAPI 4. Likely low-flow, low-gradient severe AS  Plan/Discussion:  Will review with TAVR team.  Arvilla Meres, MD 11:56 AM  Findings Coronary Findings Diagnostic  Dominance: Right  Left Main Ost LM lesion is 60% stenosed.  Left Anterior Descending Prox LAD to Mid LAD lesion is 60%  stenosed.  Ramus Intermedius Ramus lesion is 90% stenosed.  Left Circumflex Ost Cx lesion is 50% stenosed.  First Obtuse Marginal Branch  Right Coronary Artery Collaterals Dist RCA filled by collaterals from Ost RCA.  Prox RCA lesion is 100% stenosed.  Right Posterior Atrioventricular Artery Collaterals RPAV filled by collaterals from 1st Sept.  Intervention  No interventions have been documented.   CARDIAC CATHETERIZATION  CARDIAC CATHETERIZATION 02/28/2022  Narrative   Ost RCA to Prox RCA lesion is 100% stenosed.   Ost LM lesion is 60% stenosed.   Ost Cx lesion is 50% stenosed.   Prox LAD to Mid LAD lesion is 60% stenosed.   Ramus lesion is 90% stenosed.  Findings:  On dobutamine 2.5  Ao = 83/53 (67) LV = 92/14 RA = 19 (with prominent v- waves) RV = 28/18 PA = 27/11 (19) PCW = 5 Fick cardiac output/index = 4.1/2.3 PVR = 3.4 WU FA sat = 88% PA sat = 43%, 49% PAPi = 0.8 AoV gradient peak to peak 2.43mmHG on pullback  Assessment: 1. 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 Borderline 60% LM lesion 3. Mild AS 4. Hemodynamics c/w with severe RV failure and severe TR  Plan/Discussion:  Continue treatment of RV infarct. Will review LM lesion with IC team. Currently stable and no urgent need for revascularization.  Arvilla Meres, MD 11:11 AM  Findings Coronary Findings Diagnostic  Dominance: Right  Left Main Ost LM lesion is 60% stenosed.  Left Anterior Descending Prox LAD to Mid LAD lesion is 60% stenosed.  Ramus Intermedius Ramus lesion is 90% stenosed.  Left Circumflex Ost Cx lesion is 50% stenosed.  First Obtuse Marginal Branch  Right Coronary Artery Ost RCA to Prox RCA lesion is 100% stenosed.  Intervention  No interventions have been documented.     ECHOCARDIOGRAM  ECHOCARDIOGRAM COMPLETE 07/31/2023  Narrative ECHOCARDIOGRAM REPORT    Patient Name:   ADLER ALTON Date of Exam:  07/31/2023 Medical Rec #:  [id number]      Height:       57.0 in Accession #:    [id number]     Weight:       172.2 lb Date of Birth:  Jul 29, 1946      BSA:          1.688 m Patient Age:    77 years       BP:           142/90 mmHg Patient Gender: F              HR:           73 bpm. Exam Location:  Outpatient  Procedure: 2D Echo, Cardiac Doppler and Color Doppler  Indications:    CHF  History:        Patient has prior history of Echocardiogram examinations, most recent 07/24/2022. CHF and Cardiomyopathy; Risk Factors:Hypertension, Diabetes and Dyslipidemia.  Sonographer:    Neysa Bonito Roar Referring Phys: [id number] DANIEL R BENSIMHON  IMPRESSIONS   1. Left ventricular ejection fraction, by estimation, is 40 to 45%. The left ventricle has mildly decreased function. The left ventricle demonstrates global hypokinesis. Left ventricular diastolic parameters are consistent with Grade I diastolic dysfunction (impaired relaxation). There is the interventricular septum is flattened in diastole ('D' shaped left ventricle), consistent with right ventricular volume overload. There is severe hypokinesis of the left ventricular, entire inferoseptal wall and inferior wall. 2. Right ventricular systolic function is moderately reduced. The right ventricular size is mildly enlarged. There is moderately elevated pulmonary artery systolic pressure. 3. Left atrial size was mildly dilated. 4. Right atrial size was mildly dilated. 5. The mitral valve is degenerative. No evidence of mitral valve regurgitation. No evidence of mitral stenosis. Moderate mitral annular calcification. 6. The tricuspid valve is abnormal. Tricuspid valve regurgitation is severe. 7. The aortic valve is tricuspid. There is moderate calcification of the aortic valve. There is severe thickening of the aortic valve. Aortic valve regurgitation is not visualized. Severe aortic valve stenosis. Aortic valve mean gradient measures 27.0 mmHg. Aortic valve  Vmax measures 3.41 m/s. Aortic valve acceleration time measures 106 msec. 8. The inferior vena cava is normal in size with greater than 50% respiratory variability, suggesting right atrial pressure of 3 mmHg.  Comparison(s): Prior images reviewed side by side. The left ventricular function is unchanged. The right ventricular systolic function has improved slightly. Current findings are consistent with low flow low gradient severe aortic stenosis.  FINDINGS Left Ventricle: Left ventricular ejection fraction, by estimation, is 40 to 45%. The left ventricle has mildly decreased function. The left ventricle demonstrates global hypokinesis. Severe hypokinesis of the left ventricular, entire inferoseptal wall and inferior wall. The left ventricular internal cavity size was normal in size. There is no left ventricular hypertrophy. The interventricular septum is flattened in diastole ('D' shaped left ventricle), consistent with right ventricular volume overload. Left ventricular diastolic parameters are consistent with Grade I diastolic dysfunction (impaired relaxation). Normal left ventricular filling pressure.  Right Ventricle: The right ventricular size is mildly enlarged. Right vetricular wall thickness was not well visualized. Right ventricular systolic function is moderately reduced. There is moderately elevated pulmonary artery systolic pressure. The tricuspid regurgitant velocity is 3.33 m/s, and with an assumed right atrial pressure of 3 mmHg, the  estimated right ventricular systolic pressure is 47.4 mmHg.  Left Atrium: Left atrial size was mildly dilated.  Right Atrium: Right atrial size was mildly dilated.  Pericardium: There is no evidence of pericardial effusion.  Mitral Valve: The mitral valve is degenerative in appearance. Moderate mitral annular calcification. No evidence of mitral valve regurgitation. No evidence of mitral valve stenosis. MV peak gradient, 4.8 mmHg. The mean mitral valve  gradient is 2.0 mmHg.  Tricuspid Valve: The tricuspid valve is abnormal. Tricuspid valve regurgitation is severe.  Aortic Valve: The aortic valve is tricuspid. There is moderate calcification of the aortic valve. There is severe thickening of the aortic valve. Aortic valve regurgitation is not visualized. Severe aortic stenosis is present. Aortic valve mean gradient measures 27.0 mmHg. Aortic valve peak gradient measures 46.6 mmHg. Aortic valve area, by VTI measures 0.76 cm.  Pulmonic Valve: The pulmonic valve was normal in structure. Pulmonic valve regurgitation is not visualized. No evidence of pulmonic stenosis.  Aorta: The aortic root and ascending aorta are structurally normal, with no evidence of dilitation.  Venous: The inferior vena cava is normal in size with greater than 50% respiratory variability, suggesting right atrial pressure of 3 mmHg.  IAS/Shunts: The interatrial septum was not well visualized.   LEFT VENTRICLE PLAX 2D LVIDd:         5.10 cm     Diastology LVIDs:         4.40 cm     LV e' medial:    6.00 cm/s LV PW:         0.90 cm     LV E/e' medial:  13.1 LV IVS:        1.00 cm     LV e' lateral:   8.00 cm/s LVOT diam:     2.14 cm     LV E/e' lateral: 9.8 LV SV:         59 LV SV Index:   35 LVOT Area:     3.60 cm  LV Volumes (MOD) LV vol d, MOD A2C: 81.6 ml LV vol d, MOD A4C: 79.5 ml LV vol s, MOD A2C: 56.2 ml LV vol s, MOD A4C: 53.3 ml LV SV MOD A2C:     25.4 ml LV SV MOD A4C:     79.5 ml LV SV MOD BP:      24.7 ml  RIGHT VENTRICLE RV Basal diam:  3.70 cm RV Mid diam:    3.90 cm RV S prime:     10.10 cm/s TAPSE (M-mode): 1.5 cm  LEFT ATRIUM             Index        RIGHT ATRIUM           Index LA diam:        3.10 cm 1.84 cm/m   RA Area:     15.60 cm LA Vol (A2C):   46.6 ml 27.61 ml/m  RA Volume:   36.80 ml  21.80 ml/m LA Vol (A4C):   25.6 ml 15.17 ml/m LA Biplane Vol: 34.8 ml 20.62 ml/m AORTIC VALVE                     PULMONIC VALVE AV  Area (Vmax):    0.87 cm      PV Vmax:          0.83 m/s AV Area (Vmean):   0.88 cm      PV Peak grad:  2.8 mmHg AV Area (VTI):     0.76 cm      PR End Diast Vel: 3.50 msec AV Vmax:           341.33 cm/s   RVOT Peak grad:   1 mmHg AV Vmean:          244.000 cm/s AV VTI:            0.778 m AV Peak Grad:      46.6 mmHg AV Mean Grad:      27.0 mmHg LVOT Vmax:         82.59 cm/s LVOT Vmean:        59.708 cm/s LVOT VTI:          0.164 m LVOT/AV VTI ratio: 0.21  AORTA Ao Sinus diam: 2.40 cm Ao STJ diam:   2.2 cm Ao Asc diam:   2.80 cm  MITRAL VALVE                TRICUSPID VALVE MV Area (PHT): 3.86 cm     TR Peak grad:   44.4 mmHg MV Peak grad:  4.8 mmHg     TR Vmax:        333.00 cm/s MV Mean grad:  2.0 mmHg MV Vmax:       1.10 m/s     SHUNTS MV Vmean:      65.7 cm/s    Systemic VTI:  0.16 m MV E velocity: 78.80 cm/s   Systemic Diam: 2.14 cm MV A velocity: 106.00 cm/s MV E/A ratio:  0.74  Mihai Croitoru MD Electronically signed by Thurmon Fair MD Signature Date/Time: 07/31/2023/3:45:49 PM    Final    MONITORS  LONG TERM MONITOR (3-14 DAYS) 11/24/2023  Narrative Patch Wear Time:  13 days and 23 hours (2024-12-10T11:48:08-499 to 2024-12-24T11:48:12-498)  1. Sinus rhythm -  avg HR of 85 bpm. 2. One run of nonsustained Ventricular Tachycardia occurred lasting 4 beats with a max rate of 164 bpm (avg 131 bpm). 3. Two 2 early am pauses occurred (back to back), the longest  lasting 4.8 secs (12 bpm) the other was 3.4 sec 4. Frequent PVCs (9.1%, [id number]), 5. Ventricular Bigeminy and Trigeminy were present. Inverted QRS complexes possibly due to inverted placement of device.  Arvilla Meres, MD 4:03 PM       ______________________________________________________________________________________________      07/31/2023: Platelets 237 08/11/2023: Hemoglobin 15.0; Hemoglobin 13.6 10/27/2023: B Natriuretic Peptide 657.0; BUN 19; Creatinine, Ser 0.95; Potassium 4.0;  Sodium 137   STS RISK CALCULATOR: Pending  NHYA CLASS: 1    ASSESSMENT AND PLAN:   1. Nonrheumatic aortic valve stenosis   2. Chronic combined systolic and diastolic heart failure (HCC)   3. Coronary artery disease involving native coronary artery of native heart without angina pectoris   4. Second degree AV block, Mobitz type II   5. Tricuspid valve insufficiency, unspecified etiology   6. Aortic atherosclerosis (HCC)   7. Hyperlipidemia LDL goal <55   8. Primary hypertension     Aortic stenosis: Surprisingly the patient remains relatively asymptomatic.  She is able to do her ADLs without much by way of limitations.  We will continue current medical therapy for now.  I will see her back in 6 months with another echocardiogram.  I did tell her to reach out to [place] if she develops any worsening shortness of breath, fatigue, presyncope, or syncope. Systolic and diastolic heart failure: Managed by advanced heart failure; continue Jardiance 10 mg  daily, torsemide 20 mg a day; off of Entresto due to hyperkalemia and Toprol due to hypotension and dizziness. RV dysfunction: PA pulsatility index normal and PVR not prohibitive. Right bundle branch block: Presence of right bundle branch block patient's risk for permanent pacemaker for TAVR procedure is pursued.  A TAVR protocol CTA and will help adjudicate the risk this. Tricuspid regurgitation: No significant peripheral edema. Coronary artery disease: Continue medical management with aspirin 81 mg daily, Lipitor 80 mg, and stop Plavix 75 mg daily. Hypertension: Blood pressure is well-controlled.. Hyperlipidemia: Check lipid panel, LFTs, LP(a) today.  Given history of acute coronary syndrome goal LDL is less than 55. Aortic atherosclerosis: Continue aspirin 81 mg Lipitor 80 mg.  Strict blood pressure control. Prediabetes: Continue Jardiance 10 mg daily. Elevated BMI   I have personally reviewed the patients imaging data as summarized above.  I  have reviewed the natural history of aortic stenosis with the patient and family members who are present today. We have discussed the limitations of medical therapy and the poor prognosis associated with symptomatic aortic stenosis. We have also reviewed potential treatment options, including palliative medical therapy, conventional surgical aortic valve replacement, and transcatheter aortic valve replacement. We discussed treatment options in the context of this patient's specific comorbid medical conditions.   All of the patient's questions were answered today. Will make further recommendations based on the results of studies outlined above.      Orbie Pyo, MD  02/11/2024 2:57 PM    [facility] [address] [place], [place], [place]  [address] Phone: [phone]; Fax: [phone]

## 2024-02-11 ENCOUNTER — Ambulatory Visit: Payer: Medicare HMO | Attending: Internal Medicine | Admitting: Internal Medicine

## 2024-02-11 ENCOUNTER — Encounter: Payer: Self-pay | Admitting: Internal Medicine

## 2024-02-11 VITALS — BP 120/62 | HR 62 | Ht <= 58 in | Wt 156.0 lb

## 2024-02-11 DIAGNOSIS — I071 Rheumatic tricuspid insufficiency: Secondary | ICD-10-CM

## 2024-02-11 DIAGNOSIS — I700 Atherosclerosis of aorta: Secondary | ICD-10-CM

## 2024-02-11 DIAGNOSIS — I350 Nonrheumatic aortic (valve) stenosis: Secondary | ICD-10-CM

## 2024-02-11 DIAGNOSIS — I5042 Chronic combined systolic (congestive) and diastolic (congestive) heart failure: Secondary | ICD-10-CM

## 2024-02-11 DIAGNOSIS — I10 Essential (primary) hypertension: Secondary | ICD-10-CM

## 2024-02-11 DIAGNOSIS — I2510 Atherosclerotic heart disease of native coronary artery without angina pectoris: Secondary | ICD-10-CM

## 2024-02-11 DIAGNOSIS — I441 Atrioventricular block, second degree: Secondary | ICD-10-CM

## 2024-02-11 DIAGNOSIS — E785 Hyperlipidemia, unspecified: Secondary | ICD-10-CM | POA: Diagnosis not present

## 2024-02-11 NOTE — Patient Instructions (Addendum)
 Medication Instructions:  Your physician has recommended you make the following change in your medication:  STOP PLavix  *If you need a refill on your cardiac medications before your next appointment, please call your pharmacy*  Testing/Procedures: Your physician has requested that you have an echocardiogram in 6 months (same day as office visit). Echocardiography is a painless test that uses sound waves to create images of your heart. It provides your doctor with information about the size and shape of your heart and how well your heart's chambers and valves are working. This procedure takes approximately one hour. There are no restrictions for this procedure. Please do NOT wear cologne, perfume, aftershave, or lotions (deodorant is allowed). Please arrive 15 minutes prior to your appointment time.  Please note: We ask at that you not bring children with you during ultrasound (echo/ vascular) testing. Due to room size and safety concerns, children are not allowed in the ultrasound rooms during exams. Our front office staff cannot provide observation of children in our lobby area while testing is being conducted. An adult accompanying a patient to their appointment will only be allowed in the ultrasound room at the discretion of the ultrasound technician under special circumstances. We apologize for any inconvenience.  Follow-Up: At [facility], you and your health needs are our priority.  As part of our continuing mission to provide you with exceptional heart care, we have created designated Provider Care Teams.  These Care Teams include your primary Cardiologist (physician) and Advanced Practice Providers (APPs -  Physician Assistants and Nurse Practitioners) who all work together to provide you with the care you need, when you need it.  We recommend signing up for the patient portal called "MyChart".  Sign up information is provided on this After Visit Summary.  MyChart is used to connect with  patients for Virtual Visits (Telemedicine).  Patients are able to view lab/test results, encounter notes, upcoming appointments, etc.  Non-urgent messages can be sent to your provider as well.   To learn more about what you can do with MyChart, go to [email or web address].    Your next appointment:   6 month(s) (call in June/July to schedule appointment)  The format for your next appointment:   In Person  Provider:   Alverda Skeans, MD{  Other Instructions   1st Floor: - Lobby - Registration  - Pharmacy  - Lab - Cafe  2nd Floor: - PV Lab - Diagnostic Testing (echo, CT, nuclear med)  3rd Floor: - Vacant  4th Floor: - TCTS (cardiothoracic surgery) - AFib Clinic - Structural Heart Clinic - Vascular Surgery  - Vascular Ultrasound  5th Floor: - [facility] (general and EP) - Clinical Pharmacy for coumadin, hypertension, lipid, weight-loss medications, and med management appointments    Valet parking services will be available as well.

## 2024-03-22 ENCOUNTER — Telehealth: Payer: Self-pay | Admitting: Internal Medicine

## 2024-03-22 NOTE — Telephone Encounter (Signed)
 Needs echo prior to next visit in Sept with Dr. Lorie Rook.

## 2024-03-22 NOTE — Telephone Encounter (Signed)
 Called patient 03/22/2024 @12 :52 to r/s her Echo. Patient needs to schedule Echo for prior to seeing Dr. Lorie Rook to go over Echo results. *It was a scheduling error.*

## 2024-04-22 ENCOUNTER — Telehealth (HOSPITAL_COMMUNITY): Payer: Self-pay | Admitting: Internal Medicine

## 2024-04-22 NOTE — Telephone Encounter (Signed)
 Lmtcb to sch f/u w/APP swing next week

## 2024-05-27 ENCOUNTER — Telehealth (HOSPITAL_COMMUNITY): Payer: Self-pay | Admitting: Internal Medicine

## 2024-06-15 ENCOUNTER — Other Ambulatory Visit (HOSPITAL_COMMUNITY): Payer: Self-pay | Admitting: Physician Assistant

## 2024-07-12 NOTE — H&P (View-Only) (Signed)
 Patient ID: Amy [name] MRN: [id number] DOB/AGE: 1946/08/09 78 y.o.  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 The patient is a 78 year old female with the above listed medical problems referred for conditions regarding the patient's low-flow low gradient severe aortic stenosis.  The patient's medical history is significant for late presentation inferior myocardial infarction sustained in April 2023.  She was treated medically with inotropes.  Her course was complicated by transient second-degree Mobitz type II block.  No pacemaker was indicated and her rhythm abnormalities resolved.  She was started on goal-directed medical therapy with an increase in her ejection fraction from around 30 to 35% to 40 to 45%.  She underwent coronary angiography and right heart catheterization in September.  This showed unchanged burden of coronary artery disease with occluded proximal right coronary artery and  moderate disease of the left system.  Her PVR was elevated at 4.7 Woods units however her PA pulsatility index was 7.5 and her mean PA pressure was 21 mmHg.  Seen in the advanced heart failure clinic in December.  At that point in time she endorsed NYHA class II symptoms and was euvolemic on exam.  Her Toprol  and digoxin  was were stopped.  Due to low-flow low gradient severe aortic stenosis on her most recent echocardiogram she is referred for further recommendations.  No chest pain, but she experiences occasional dizziness, particularly in the mornings when transitioning from sitting to standing. This dizziness has been present since the early 1990s and has not worsened. No peripheral edema or orthopnea, although she props herself up in bed.  Her daily activities include cooking, doing laundry, and cleaning, but she does not perform all tasks in one day. She rests when feeling tired and attributes any recent changes in breathing or fatigue to seasonal allergies due to pine and oak trees in her yard. She feels she can do more now than a year ago, as she can handle heavier objects more easily.  She takes torsemide , initially prescribed at 20 mg daily, but adjusted to 10 mg three times a week due to side effects. She also takes potassium on alternate days to manage the side effects of torsemide . She previously took metoprolol  and digoxin  but discontinued them due to side effects, including excessive sleepiness.  She denies any dental symptoms.  Plan: Follow-up in 6 months with repeat echocardiogram  September 2025:  Patient consents to use of AI scribe. The patient returns for routine follow-up.  An echocardiogram done recently demonstrated moderate to severe aortic stenosis with a  relatively unchanged ejection fraction and mean gradient.  Over the past few months, she has experienced increased fatigue and shortness of breath, particularly during activities such as sweeping or mopping, which she can only  perform slowly. She attributes some of this fatigue to the hot weather and possibly the COVID-19 vaccine. Shortness of breath occurs when she is rushed or stressed, but she does not experience severe dyspnea. No chest pain or syncope. No orthopnea.  She has a history of dizziness and headaches, which she attributes to past myocardial infarctions. The dizziness has been intermittent since then, and she has become accustomed to it. There has been no recent increase in dizziness or episodes of syncope.  She underwent cataract surgery and has glasses, though she was not advised to wear them constantly. Her vision is generally good, but she experiences difficulty focusing when her left eye is irritated, which she attributes to wind and allergens.  She is a widow and lives with her daughter, who orders groceries but does not assist much with other tasks. She drives herself but does not go to the grocery store.  She reports dental issues, including a broken tooth and cavities, which were identified during a recent dental visit. She experiences xerostomia, which she suspects contributes to her dental problems.       Past Medical History:  Diagnosis Date   CHF (congestive heart failure) (HCC)    Coronary artery disease    GERD (gastroesophageal reflux disease)    Hyperlipidemia    Obesity    Pre-diabetes     Past Surgical History:  Procedure Laterality Date   CARDIAC CATHETERIZATION     CARPAL TUNNEL RELEASE Bilateral    FEMUR FRACTURE SURGERY     REPLACEMENT TOTAL KNEE BILATERAL     RIGHT/LEFT HEART CATH AND CORONARY ANGIOGRAPHY N/A 10/22/2021   Procedure: RIGHT/LEFT HEART CATH AND CORONARY ANGIOGRAPHY;  Surgeon: Claudene Victory ORN, MD;  Location: [facility];  Service: Cardiovascular;  Laterality: N/A;   RIGHT/LEFT HEART CATH AND CORONARY ANGIOGRAPHY N/A 02/28/2022   Procedure: RIGHT/LEFT HEART CATH AND CORONARY ANGIOGRAPHY;  Surgeon: Cherrie Toribio SAUNDERS, MD;  Location: [facility];   Service: Cardiovascular;  Laterality: N/A;   RIGHT/LEFT HEART CATH AND CORONARY ANGIOGRAPHY N/A 08/11/2023   Procedure: RIGHT/LEFT HEART CATH AND CORONARY ANGIOGRAPHY;  Surgeon: Cherrie Toribio SAUNDERS, MD;  Location: [facility];  Service: Cardiovascular;  Laterality: N/A;   SHOULDER SURGERY Right    rotator cuff    Family History  Problem Relation Age of Onset   CAD Neg Hx     Social History   Socioeconomic History   Marital status: Widowed    Spouse name: Not on file   Number of children: Not on file   Years of education: 12   Highest education level: High school graduate  Occupational History   Occupation: retired  Tobacco Use   Smoking status: Never   Smokeless tobacco: Never  Vaping Use   Vaping status: Not on file  Substance and Sexual Activity   Alcohol  use: No   Drug use: No   Sexual activity: Not Currently  Other Topics Concern   Not on file  Social History Narrative   Not on file   Social Drivers of Health   Financial Resource Strain: Not on file  Food Insecurity: No Food Insecurity (07/24/2022)   Hunger Vital Sign    Worried About Running Out of Food in the Last Year: Never true    Ran  Out of Food in the Last Year: Never true  Transportation Needs: No Transportation Needs (07/24/2022)   PRAPARE - Administrator, Civil Service (Medical): No    Lack of Transportation (Non-Medical): No  Physical Activity: Not on file  Stress: Not on file  Social Connections: Not on file  Intimate Partner Violence: Not on file     Prior to Admission medications   Medication Sig Start Date End Date Taking? Authorizing Provider  acetaminophen  (TYLENOL ) 500 MG tablet Take 500 mg by mouth every 6 (six) hours as needed for mild pain or headache.    [provider]  aspirin  81 MG EC tablet Take 81 mg by mouth every evening.    [provider]  atorvastatin  (LIPITOR ) 80 MG tablet Take 1 tablet (80 mg total) by mouth daily. 10/24/21   Bryn Bernardino NOVAK, MD   B Complex-C (B-COMPLEX WITH VITAMIN C) tablet Take 1 tablet by mouth daily. 11/21/15   [provider]  BORON PO Take 3 mg by mouth daily.    [provider]  CALCIUM  PO Take 2-3 tablets by mouth daily. 333 mg each    [provider]  Carboxymethylcellulose Sodium (DRY EYE RELIEF OP) Place 1 drop into both eyes 4 (four) times daily as needed (dryness).    [provider]  cetirizine (ZYRTEC) 10 MG tablet Take 10 mg by mouth daily.    [provider]  cholecalciferol (VITAMIN D3) 25 MCG (1000 UNIT) tablet Take 1,000 Units by mouth daily.    [provider]  Chromium (GTF CHROMIUM) 200 MCG TABS Take 200 mcg by mouth daily.    [provider]  clopidogrel  (PLAVIX ) 75 MG tablet Take 1 tablet (75 mg total) by mouth daily. 04/06/22   Zhao, Xika, NP  Coenzyme Q10 (CO Q-10) 200 MG CAPS Take 200 mg by mouth daily.    [provider]  DHEA 25 MG tablet Take 25 mg by mouth daily.    [provider]  Glucosamine-Chondroitin 750-600 MG TABS Take 1 tablet by mouth 2 (two) times daily.    [provider]  JARDIANCE  10 MG TABS tablet TAKE 1 TABLET EVERY DAY 04/28/23   Colletta Manuelita Garre, PA-C  meclizine  (ANTIVERT ) 25 MG tablet Take 25 mg by mouth daily. 09/12/21   [provider]  Methylsulfonylmethane (MSM) 1000 MG CAPS Take 1,000 mg by mouth daily.    [provider]  Misc Natural Products (BEET ROOT) 500 MG CAPS Take 500 mg by mouth 3 (three) times daily.    [provider]  Multiple Vitamin (MULTI-VITAMIN) tablet Take 1 tablet by mouth daily.    [provider]  Omega 3-6-9 Fatty Acids (OMEGA 3-6-9 PO) Take 1 capsule by mouth daily. Flax seed    [provider]  Omega-3 Fatty Acids (FISH OIL PO) Take 1 capsule by mouth at bedtime.    [provider]  OVER THE COUNTER MEDICATION Take 1 capsule by mouth daily. Sea Mining engineer, Historical, MD  OVER THE COUNTER  MEDICATION Place 1 Application into both nostrils daily as needed (congestion). Vicks Nasal Sticks    [provider]  potassium chloride  (KLOR-CON ) 10 MEQ tablet Take 1 tablet (10 mEq total) by mouth 3 (three) times a week. 10/30/23   Lee, [place], NP  Pramoxine-Dimethicone (GOLD BOND INTENSIVE HEALING EX) Apply 1 application  topically daily as needed (eczema).    [provider]  torsemide  (DEMADEX ) 20 MG tablet  Take 10 mg by mouth daily as needed (fluid retention.).    [provider]  Vanadyl Sulfate CRYS Take 10 mg by mouth daily.    [provider]  vitamin E 180 MG (400 UNITS) capsule Take 400 Units by mouth daily.    [provider]    Allergies  Allergen Reactions   Timolol Shortness Of Breath   Codeine Other (See Comments)    sick   Morphine And Codeine Nausea And Vomiting   Other Other (See Comments)    vicryl sutures - rash/redness, delay in healing    REVIEW OF SYSTEMS:  General: no fevers/chills/night sweats Eyes: no blurry vision, diplopia, or amaurosis ENT: no sore throat or hearing loss Resp: no cough, wheezing, or hemoptysis CV: no edema or palpitations GI: no abdominal pain, nausea, vomiting, diarrhea, or constipation GU: no dysuria, frequency, or hematuria Skin: no rash Neuro: no headache, numbness, tingling, or weakness of extremities Musculoskeletal: no joint pain or swelling Heme: no bleeding, DVT, or easy bruising Endo: no polydipsia or polyuria  BP 120/70   Pulse 64   Ht 4' 9.5 (1.461 m)   Wt 152 lb (68.9 kg)   SpO2 96%   BMI 32.32 kg/m   PHYSICAL EXAM: GEN:  AO x 3 in no acute distress HEENT: normal Dentition: Poor Neck: JVP normal. +2 carotid upstrokes without bruits. No thyromegaly. Lungs: equal expansion, clear bilaterally CV: Apex is discrete and nondisplaced, RRR with 3/6 SEM LUSB Abd: soft, non-tender, non-distended; no bruit; positive bowel sounds Ext: no edema, ecchymoses, or  cyanosis Vascular: 2+ femoral pulses, 2+ radial pulses       Skin: warm and dry without rash Neuro: CN II-XII grossly intact; motor and sensory grossly intact    DATA AND STUDIES:  EKG: EKG from December 2024 demonstrates sinus rhythm with inferior infarction pattern and right bundle branch block with PVCs  EKG Interpretation Date/Time:    Ventricular Rate:    PR Interval:    QRS Duration:    QT Interval:    QTC Calculation:   R Axis:      Text Interpretation:          Cardiac Studies & Procedures   ______________________________________________________________________________________________ CARDIAC CATHETERIZATION  CARDIAC CATHETERIZATION 08/11/2023  Conclusion   Ost LM lesion is 60% stenosed.   Prox LAD to Mid LAD lesion is 60% stenosed.   Ost Cx lesion is 50% stenosed.   Ramus lesion is 90% stenosed.   Prox RCA lesion is 100% stenosed.   The left ventricular ejection fraction is 55-65% by visual estimate.  Findings:  Ao = 105/49 (71) LV =  140/15 RA = 4 RV = 39/8 PA = 37/7 (21) PCW = 5 Fick cardiac output/index = 3.4/2.0 PVR = 4.7 WU FA sat = 92% PA sat = 63% PAPi = 7.5 AoV mean gradient AoV Peak to Peak gradient Calculated AVA 0.61cm2  Assessment: 1. Stable 3v CAD with borderline LM lesion (ventricularizes on catheter placement) 2. EF 60-65% 3. Very mild PAH with normal PAPI 4. Likely low-flow, low-gradient severe AS  Plan/Discussion:  Will review with TAVR team.  Toribio Fuel, MD 11:56 AM  Findings Coronary Findings Diagnostic  Dominance: Right  Left Main Ost LM lesion is 60% stenosed.  Left Anterior Descending Prox LAD to Mid LAD lesion is 60% stenosed.  Ramus Intermedius Ramus lesion is 90% stenosed.  Left Circumflex Ost Cx lesion is 50% stenosed.  First Obtuse Marginal Branch  Right Coronary Artery Collaterals  Dist RCA filled by collaterals from Ost RCA.  Prox RCA lesion is 100% stenosed.  Right  Posterior Atrioventricular Artery Collaterals RPAV filled by collaterals from 1st Sept.  Intervention  No interventions have been documented.   CARDIAC CATHETERIZATION  CARDIAC CATHETERIZATION 02/28/2022  Conclusion   Ost RCA to Prox RCA lesion is 100% stenosed.   Ost LM lesion is 60% stenosed.   Ost Cx lesion is 50% stenosed.   Prox LAD to Mid LAD lesion is 60% stenosed.   Ramus lesion is 90% stenosed.  Findings:  On dobutamine  2.5  Ao = 83/53 (67) LV = 92/14 RA = 19 (with prominent v- waves) RV = 28/18 PA = 27/11 (19) PCW = 5 Fick cardiac output/index = 4.1/2.3 PVR = 3.4 WU FA sat = 88% PA sat = 43%, 49% PAPi = 0.8 AoV gradient peak to peak 2.75mmHG on pullback  Assessment: 1. 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 Borderline 60% LM lesion 3. Mild AS 4. Hemodynamics c/w with severe RV failure and severe TR  Plan/Discussion:  Continue treatment of RV infarct. Will review LM lesion with IC team. Currently stable and no urgent need for revascularization.  Toribio Fuel, MD 11:11 AM  Findings Coronary Findings Diagnostic  Dominance: Right  Left Main Ost LM lesion is 60% stenosed.  Left Anterior Descending Prox LAD to Mid LAD lesion is 60% stenosed.  Ramus Intermedius Ramus lesion is 90% stenosed.  Left Circumflex Ost Cx lesion is 50% stenosed.  First Obtuse Marginal Branch  Right Coronary Artery Ost RCA to Prox RCA lesion is 100% stenosed.  Intervention  No interventions have been documented.     ECHOCARDIOGRAM  ECHOCARDIOGRAM COMPLETE 07/13/2024  Narrative ECHOCARDIOGRAM REPORT    Patient Name:   Amy [name] Date of Exam: 07/13/2024 Medical Rec #:  [id number]      Height:       57.5 in Accession #:    [id number]     Weight:       156.0 lb Date of Birth:  [birth year]      BSA:          1.629 m Patient Age:    78 years       BP:           120/62 mmHg Patient Gender: F              HR:            98 bpm. Exam Location:  [address]  Procedure: 2D Echo and Intracardiac Opacification Agent (Both Spectral and Color Flow Doppler were utilized during procedure).  Indications:    I35.0 Nonrheumatic aortic (valve) stenosis  History:        Patient has prior history of Echocardiogram examinations, most recent 07/31/2023. CHF, CAD and Previous Myocardial Infarction, Arrythmias:LBBB; Risk Factors:Hypertension and Dyslipidemia. Prediabetes. Aortic atherosclerosis. Obesity.  Sonographer:    Jon Hacker RCS Referring Phys: [id number] Kieron Kantner K Milayah Krell  IMPRESSIONS   1. Akinesis of the inferolateral and inferoseptal walls; overall EF 40-45. Calcified aortic valve with severe AS (mean gradient 26.5 mmHg; AVA 0.93 cm2); trace AI. 2. Left ventricular ejection fraction, by estimation, is 40 to 45%. The left ventricle has mildly decreased function. The left ventricle demonstrates regional wall motion abnormalities (see scoring diagram/findings for description). Left ventricular diastolic parameters are consistent with Grade I diastolic dysfunction (impaired relaxation). There is the interventricular septum is flattened in systole and diastole, consistent with right ventricular  pressure and volume overload. 3. Right ventricular systolic function is normal. The right ventricular size is mildly enlarged. 4. The mitral valve is normal in structure. Mild mitral valve regurgitation. No evidence of mitral stenosis. Moderate mitral annular calcification. 5. Tricuspid valve regurgitation is moderate to severe. 6. The aortic valve is calcified. Aortic valve regurgitation is trivial. Severe aortic valve stenosis. 7. The inferior vena cava is normal in size with greater than 50% respiratory variability, suggesting right atrial pressure of 3 mmHg.  FINDINGS Left Ventricle: Left ventricular ejection fraction, by estimation, is 40 to 45%. The left ventricle has mildly decreased function. The left ventricle  demonstrates regional wall motion abnormalities. Definity  contrast agent was given IV to delineate the left ventricular endocardial borders. The left ventricular internal cavity size was normal in size. There is no left ventricular hypertrophy. The interventricular septum is flattened in systole and diastole, consistent with right ventricular pressure and volume overload. Left ventricular diastolic parameters are consistent with Grade I diastolic dysfunction (impaired relaxation).  Right Ventricle: The right ventricular size is mildly enlarged. Right ventricular systolic function is normal.  Left Atrium: Left atrial size was normal in size.  Right Atrium: Right atrial size was normal in size.  Pericardium: There is no evidence of pericardial effusion.  Mitral Valve: The mitral valve is normal in structure. Moderate mitral annular calcification. Mild mitral valve regurgitation. No evidence of mitral valve stenosis.  Tricuspid Valve: The tricuspid valve is normal in structure. Tricuspid valve regurgitation is moderate to severe. No evidence of tricuspid stenosis.  Aortic Valve: The aortic valve is calcified. Aortic valve regurgitation is trivial. Severe aortic stenosis is present. Aortic valve mean gradient measures 26.5 mmHg. Aortic valve peak gradient measures 49.6 mmHg. Aortic valve area, by VTI measures 0.93 cm.  Pulmonic Valve: The pulmonic valve was normal in structure. Pulmonic valve regurgitation is trivial. No evidence of pulmonic stenosis.  Aorta: The aortic root is normal in size and structure.  Venous: The inferior vena cava is normal in size with greater than 50% respiratory variability, suggesting right atrial pressure of 3 mmHg.  IAS/Shunts: No atrial level shunt detected by color flow Doppler.  Additional Comments: Akinesis of the inferolateral and inferoseptal walls; overall EF 40-45. Calcified aortic valve with severe AS (mean gradient 26.5 mmHg; AVA 0.93 cm2); trace  AI.   LEFT VENTRICLE PLAX 2D LVIDd:         5.34 cm   Diastology LVIDs:         4.58 cm   LV e' medial:    5.33 cm/s LV PW:         0.95 cm   LV E/e' medial:  12.6 LV IVS:        0.99 cm   LV e' lateral:   7.18 cm/s LVOT diam:     2.00 cm   LV E/e' lateral: 9.3 LV SV:         71 LV SV Index:   44 LVOT Area:     3.14 cm   RIGHT VENTRICLE RV Basal diam:  4.52 cm RV S prime:     10.80 cm/s TAPSE (M-mode): 2.2 cm RVSP:           46.3 mmHg  LEFT ATRIUM           Index        RIGHT ATRIUM           Index LA diam:      3.30 cm 2.03 cm/m  RA Pressure: 3.00 mmHg LA Vol (A4C): 20.2 ml 12.40 ml/m  RA Area:     17.80 cm RA Volume:   45.70 ml  28.06 ml/m AORTIC VALVE AV Area (Vmax):    1.06 cm AV Area (Vmean):   1.17 cm AV Area (VTI):     0.93 cm AV Vmax:           352.00 cm/s AV Vmean:          232.500 cm/s AV VTI:            0.766 m AV Peak Grad:      49.6 mmHg AV Mean Grad:      26.5 mmHg LVOT Vmax:         119.00 cm/s LVOT Vmean:        86.500 cm/s LVOT VTI:          0.227 m LVOT/AV VTI ratio: 0.30  AORTA Ao Root diam: 2.70 cm Ao Asc diam:  3.10 cm  MV E velocity: 67.10 cm/s   TRICUSPID VALVE MV A velocity: 104.00 cm/s  TR Peak grad:   43.3 mmHg MV E/A ratio:  0.65         TR Vmax:        329.00 cm/s Estimated RAP:  3.00 mmHg RVSP:           46.3 mmHg  SHUNTS Systemic VTI:  0.23 m Systemic Diam: 2.00 cm  Redell Shallow MD Electronically signed by Redell Shallow MD Signature Date/Time: 07/13/2024/4:56:42 PM    Final    MONITORS  LONG TERM MONITOR (3-14 DAYS) 11/24/2023  Narrative Patch Wear Time:  13 days and 23 hours (2024-12-10T11:48:08-499 to 2024-12-24T11:48:12-498)  1. Sinus rhythm -  avg HR of 85 bpm. 2. One run of nonsustained Ventricular Tachycardia occurred lasting 4 beats with a max rate of 164 bpm (avg 131 bpm). 3. Two 2 early am pauses occurred (back to back), the longest  lasting 4.8 secs (12 bpm) the other was 3.4 sec 4. Frequent  PVCs (9.1%, [id number]), 5. Ventricular Bigeminy and Trigeminy were present. Inverted QRS complexes possibly due to inverted placement of device.  Toribio Fuel, MD 4:03 PM       ______________________________________________________________________________________________      07/31/2023: Platelets 237 08/11/2023: Hemoglobin 15.0; Hemoglobin 13.6 10/27/2023: B Natriuretic Peptide 657.0; BUN 19; Creatinine, Ser 0.95; Potassium 4.0; Sodium 137   STS RISK CALCULATOR: Pending  NHYA CLASS: 2    ASSESSMENT AND PLAN:   1. Nonrheumatic aortic valve stenosis   2. Chronic combined systolic and diastolic heart failure (HCC)   3. Right ventricular dysfunction   4. Right bundle branch block   5. Tricuspid valve insufficiency, unspecified etiology   6. Coronary artery disease involving native coronary artery of native heart without angina pectoris   7. Primary hypertension   8. Hyperlipidemia LDL goal <55   9. Aortic atherosclerosis (HCC)   10. Prediabetes   11. BMI 34.0-34.9,adult      Aortic stenosis: The patient has developed NYHA class II symptoms of fatigue.  Her echocardiogram demonstrates moderate to severe aortic stenosis with a dimensionless index of 0.3 with a highly calcified valve with restricted motion.  Will refer for coronary angiography, right heart catheterization (with Dr. Fuel), CT scan, and cardiothoracic surgical opinion.  The patient has developed dental issues and is seeing a dentist.  I told her that her dental issues would need to be managed prior to aortic valve intervention. Systolic and diastolic heart failure: Managed by  advanced heart failure; continue Jardiance  10 mg daily, torsemide  20 mg a day; off of Entresto  due to hyperkalemia and Toprol  due to hypotension and dizziness. RV dysfunction: PA pulsatility index normal and PVR not prohibitive. Right bundle branch block: Presence of right bundle branch block patient's risk for permanent pacemaker for TAVR  procedure is pursued.  A TAVR protocol CTA and will help adjudicate the risk this. Tricuspid regurgitation: No significant peripheral edema. Coronary artery disease: Continue medical management with aspirin  81 mg daily, Lipitor  80 mg Hypertension: BP is well-controlled today Hyperlipidemia: Continue atorvastatin  80 mg Aortic atherosclerosis: Continue aspirin  81 mg Lipitor  80 mg.  Strict blood pressure control. Prediabetes: Continue Jardiance  10 mg daily, strict control of cardiovascular risk factors Elevated BMI: BSA under 2 when considering aortic valve intervention.   I spent 38 minutes reviewing all clinical data during and prior to this visit including all relevant imaging studies, laboratories, clinical information from other health systems and prior notes from both Cardiology and other specialties, interviewing the patient, conducting a complete physical examination, and coordinating care in order to formulate a comprehensive and personalized evaluation and treatment plan.      Klaire Court K Khaleesi Gruel, MD  07/21/2024 4:07 PM    [facility] [address] [place], [place], [place]  [address] Phone: [phone]; Fax: [phone]

## 2024-07-12 NOTE — Progress Notes (Unsigned)
 Patient ID: Amy [name] MRN: [id number] DOB/AGE: 1946/08/09 78 y.o.  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 The patient is a 78 year old female with the above listed medical problems referred for conditions regarding the patient's low-flow low gradient severe aortic stenosis.  The patient's medical history is significant for late presentation inferior myocardial infarction sustained in April 2023.  She was treated medically with inotropes.  Her course was complicated by transient second-degree Mobitz type II block.  No pacemaker was indicated and her rhythm abnormalities resolved.  She was started on goal-directed medical therapy with an increase in her ejection fraction from around 30 to 35% to 40 to 45%.  She underwent coronary angiography and right heart catheterization in September.  This showed unchanged burden of coronary artery disease with occluded proximal right coronary artery and  moderate disease of the left system.  Her PVR was elevated at 4.7 Woods units however her PA pulsatility index was 7.5 and her mean PA pressure was 21 mmHg.  Seen in the advanced heart failure clinic in December.  At that point in time she endorsed NYHA class II symptoms and was euvolemic on exam.  Her Toprol  and digoxin  was were stopped.  Due to low-flow low gradient severe aortic stenosis on her most recent echocardiogram she is referred for further recommendations.  No chest pain, but she experiences occasional dizziness, particularly in the mornings when transitioning from sitting to standing. This dizziness has been present since the early 1990s and has not worsened. No peripheral edema or orthopnea, although she props herself up in bed.  Her daily activities include cooking, doing laundry, and cleaning, but she does not perform all tasks in one day. She rests when feeling tired and attributes any recent changes in breathing or fatigue to seasonal allergies due to pine and oak trees in her yard. She feels she can do more now than a year ago, as she can handle heavier objects more easily.  She takes torsemide , initially prescribed at 20 mg daily, but adjusted to 10 mg three times a week due to side effects. She also takes potassium on alternate days to manage the side effects of torsemide . She previously took metoprolol  and digoxin  but discontinued them due to side effects, including excessive sleepiness.  She denies any dental symptoms.  Plan: Follow-up in 6 months with repeat echocardiogram  September 2025:  Patient consents to use of AI scribe. The patient returns for routine follow-up.  An echocardiogram done recently demonstrated moderate to severe aortic stenosis with a  relatively unchanged ejection fraction and mean gradient.  Over the past few months, she has experienced increased fatigue and shortness of breath, particularly during activities such as sweeping or mopping, which she can only  perform slowly. She attributes some of this fatigue to the hot weather and possibly the COVID-19 vaccine. Shortness of breath occurs when she is rushed or stressed, but she does not experience severe dyspnea. No chest pain or syncope. No orthopnea.  She has a history of dizziness and headaches, which she attributes to past myocardial infarctions. The dizziness has been intermittent since then, and she has become accustomed to it. There has been no recent increase in dizziness or episodes of syncope.  She underwent cataract surgery and has glasses, though she was not advised to wear them constantly. Her vision is generally good, but she experiences difficulty focusing when her left eye is irritated, which she attributes to wind and allergens.  She is a widow and lives with her daughter, who orders groceries but does not assist much with other tasks. She drives herself but does not go to the grocery store.  She reports dental issues, including a broken tooth and cavities, which were identified during a recent dental visit. She experiences xerostomia, which she suspects contributes to her dental problems.       Past Medical History:  Diagnosis Date   CHF (congestive heart failure) (HCC)    Coronary artery disease    GERD (gastroesophageal reflux disease)    Hyperlipidemia    Obesity    Pre-diabetes     Past Surgical History:  Procedure Laterality Date   CARDIAC CATHETERIZATION     CARPAL TUNNEL RELEASE Bilateral    FEMUR FRACTURE SURGERY     REPLACEMENT TOTAL KNEE BILATERAL     RIGHT/LEFT HEART CATH AND CORONARY ANGIOGRAPHY N/A 10/22/2021   Procedure: RIGHT/LEFT HEART CATH AND CORONARY ANGIOGRAPHY;  Surgeon: Amy Victory ORN, MD;  Location: [facility];  Service: Cardiovascular;  Laterality: N/A;   RIGHT/LEFT HEART CATH AND CORONARY ANGIOGRAPHY N/A 02/28/2022   Procedure: RIGHT/LEFT HEART CATH AND CORONARY ANGIOGRAPHY;  Surgeon: Amy Toribio SAUNDERS, MD;  Location: [facility];   Service: Cardiovascular;  Laterality: N/A;   RIGHT/LEFT HEART CATH AND CORONARY ANGIOGRAPHY N/A 08/11/2023   Procedure: RIGHT/LEFT HEART CATH AND CORONARY ANGIOGRAPHY;  Surgeon: Amy Toribio SAUNDERS, MD;  Location: [facility];  Service: Cardiovascular;  Laterality: N/A;   SHOULDER SURGERY Right    rotator cuff    Family History  Problem Relation Age of Onset   CAD Neg Hx     Social History   Socioeconomic History   Marital status: Widowed    Spouse name: Not on file   Number of children: Not on file   Years of education: 12   Highest education level: High school graduate  Occupational History   Occupation: retired  Tobacco Use   Smoking status: Never   Smokeless tobacco: Never  Vaping Use   Vaping status: Not on file  Substance and Sexual Activity   Alcohol  use: No   Drug use: No   Sexual activity: Not Currently  Other Topics Concern   Not on file  Social History Narrative   Not on file   Social Drivers of Health   Financial Resource Strain: Not on file  Food Insecurity: No Food Insecurity (07/24/2022)   Hunger Vital Sign    Worried About Running Out of Food in the Last Year: Never true    Ran  Out of Food in the Last Year: Never true  Transportation Needs: No Transportation Needs (07/24/2022)   PRAPARE - Administrator, Civil Service (Medical): No    Lack of Transportation (Non-Medical): No  Physical Activity: Not on file  Stress: Not on file  Social Connections: Not on file  Intimate Partner Violence: Not on file     Prior to Admission medications   Medication Sig Start Date End Date Taking? Authorizing Provider  acetaminophen  (TYLENOL ) 500 MG tablet Take 500 mg by mouth every 6 (six) hours as needed for mild pain or headache.    [provider]  aspirin  81 MG EC tablet Take 81 mg by mouth every evening.    [provider]  atorvastatin  (LIPITOR ) 80 MG tablet Take 1 tablet (80 mg total) by mouth daily. 10/24/21   Bryn Bernardino NOVAK, MD   B Complex-C (B-COMPLEX WITH VITAMIN C) tablet Take 1 tablet by mouth daily. 11/21/15   [provider]  BORON PO Take 3 mg by mouth daily.    [provider]  CALCIUM  PO Take 2-3 tablets by mouth daily. 333 mg each    [provider]  Carboxymethylcellulose Sodium (DRY EYE RELIEF OP) Place 1 drop into both eyes 4 (four) times daily as needed (dryness).    [provider]  cetirizine (ZYRTEC) 10 MG tablet Take 10 mg by mouth daily.    [provider]  cholecalciferol (VITAMIN D3) 25 MCG (1000 UNIT) tablet Take 1,000 Units by mouth daily.    [provider]  Chromium (GTF CHROMIUM) 200 MCG TABS Take 200 mcg by mouth daily.    [provider]  clopidogrel  (PLAVIX ) 75 MG tablet Take 1 tablet (75 mg total) by mouth daily. 04/06/22   Zhao, Xika, NP  Coenzyme Q10 (CO Q-10) 200 MG CAPS Take 200 mg by mouth daily.    [provider]  DHEA 25 MG tablet Take 25 mg by mouth daily.    [provider]  Glucosamine-Chondroitin 750-600 MG TABS Take 1 tablet by mouth 2 (two) times daily.    [provider]  JARDIANCE  10 MG TABS tablet TAKE 1 TABLET EVERY DAY 04/28/23   Colletta Manuelita Garre, PA-C  meclizine  (ANTIVERT ) 25 MG tablet Take 25 mg by mouth daily. 09/12/21   [provider]  Methylsulfonylmethane (MSM) 1000 MG CAPS Take 1,000 mg by mouth daily.    [provider]  Misc Natural Products (BEET ROOT) 500 MG CAPS Take 500 mg by mouth 3 (three) times daily.    [provider]  Multiple Vitamin (MULTI-VITAMIN) tablet Take 1 tablet by mouth daily.    [provider]  Omega 3-6-9 Fatty Acids (OMEGA 3-6-9 PO) Take 1 capsule by mouth daily. Flax seed    [provider]  Omega-3 Fatty Acids (FISH OIL PO) Take 1 capsule by mouth at bedtime.    [provider]  OVER THE COUNTER MEDICATION Take 1 capsule by mouth daily. Sea Mining engineer, Historical, MD  OVER THE COUNTER  MEDICATION Place 1 Application into both nostrils daily as needed (congestion). Vicks Nasal Sticks    [provider]  potassium chloride  (KLOR-CON ) 10 MEQ tablet Take 1 tablet (10 mEq total) by mouth 3 (three) times a week. 10/30/23   Lee, [place], NP  Pramoxine-Dimethicone (GOLD BOND INTENSIVE HEALING EX) Apply 1 application  topically daily as needed (eczema).    [provider]  torsemide  (DEMADEX ) 20 MG tablet  Take 10 mg by mouth daily as needed (fluid retention.).    [provider]  Vanadyl Sulfate CRYS Take 10 mg by mouth daily.    [provider]  vitamin E 180 MG (400 UNITS) capsule Take 400 Units by mouth daily.    [provider]    Allergies  Allergen Reactions   Timolol Shortness Of Breath   Codeine Other (See Comments)    sick   Morphine And Codeine Nausea And Vomiting   Other Other (See Comments)    vicryl sutures - rash/redness, delay in healing    REVIEW OF SYSTEMS:  General: no fevers/chills/night sweats Eyes: no blurry vision, diplopia, or amaurosis ENT: no sore throat or hearing loss Resp: no cough, wheezing, or hemoptysis CV: no edema or palpitations GI: no abdominal pain, nausea, vomiting, diarrhea, or constipation GU: no dysuria, frequency, or hematuria Skin: no rash Neuro: no headache, numbness, tingling, or weakness of extremities Musculoskeletal: no joint pain or swelling Heme: no bleeding, DVT, or easy bruising Endo: no polydipsia or polyuria  BP 120/70   Pulse 64   Ht 4' 9.5 (1.461 m)   Wt 152 lb (68.9 kg)   SpO2 96%   BMI 32.32 kg/m   PHYSICAL EXAM: GEN:  AO x 3 in no acute distress HEENT: normal Dentition: Poor Neck: JVP normal. +2 carotid upstrokes without bruits. No thyromegaly. Lungs: equal expansion, clear bilaterally CV: Apex is discrete and nondisplaced, RRR with 3/6 SEM LUSB Abd: soft, non-tender, non-distended; no bruit; positive bowel sounds Ext: no edema, ecchymoses, or  cyanosis Vascular: 2+ femoral pulses, 2+ radial pulses       Skin: warm and dry without rash Neuro: CN II-XII grossly intact; motor and sensory grossly intact    DATA AND STUDIES:  EKG: EKG from December 2024 demonstrates sinus rhythm with inferior infarction pattern and right bundle branch block with PVCs  EKG Interpretation Date/Time:    Ventricular Rate:    PR Interval:    QRS Duration:    QT Interval:    QTC Calculation:   R Axis:      Text Interpretation:          Cardiac Studies & Procedures   ______________________________________________________________________________________________ CARDIAC CATHETERIZATION  CARDIAC CATHETERIZATION 08/11/2023  Conclusion   Ost LM lesion is 60% stenosed.   Prox LAD to Mid LAD lesion is 60% stenosed.   Ost Cx lesion is 50% stenosed.   Ramus lesion is 90% stenosed.   Prox RCA lesion is 100% stenosed.   The left ventricular ejection fraction is 55-65% by visual estimate.  Findings:  Ao = 105/49 (71) LV =  140/15 RA = 4 RV = 39/8 PA = 37/7 (21) PCW = 5 Fick cardiac output/index = 3.4/2.0 PVR = 4.7 WU FA sat = 92% PA sat = 63% PAPi = 7.5 AoV mean gradient AoV Peak to Peak gradient Calculated AVA 0.61cm2  Assessment: 1. Stable 3v CAD with borderline LM lesion (ventricularizes on catheter placement) 2. EF 60-65% 3. Very mild PAH with normal PAPI 4. Likely low-flow, low-gradient severe AS  Plan/Discussion:  Will review with TAVR team.  Toribio Fuel, MD 11:56 AM  Findings Coronary Findings Diagnostic  Dominance: Right  Left Main Ost LM lesion is 60% stenosed.  Left Anterior Descending Prox LAD to Mid LAD lesion is 60% stenosed.  Ramus Intermedius Ramus lesion is 90% stenosed.  Left Circumflex Ost Cx lesion is 50% stenosed.  First Obtuse Marginal Branch  Right Coronary Artery Collaterals  Dist RCA filled by collaterals from Ost RCA.  Prox RCA lesion is 100% stenosed.  Right  Posterior Atrioventricular Artery Collaterals RPAV filled by collaterals from 1st Sept.  Intervention  No interventions have been documented.   CARDIAC CATHETERIZATION  CARDIAC CATHETERIZATION 02/28/2022  Conclusion   Ost RCA to Prox RCA lesion is 100% stenosed.   Ost LM lesion is 60% stenosed.   Ost Cx lesion is 50% stenosed.   Prox LAD to Mid LAD lesion is 60% stenosed.   Ramus lesion is 90% stenosed.  Findings:  On dobutamine  2.5  Ao = 83/53 (67) LV = 92/14 RA = 19 (with prominent v- waves) RV = 28/18 PA = 27/11 (19) PCW = 5 Fick cardiac output/index = 4.1/2.3 PVR = 3.4 WU FA sat = 88% PA sat = 43%, 49% PAPi = 0.8 AoV gradient peak to peak 2.75mmHG on pullback  Assessment: 1. 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 Borderline 60% LM lesion 3. Mild AS 4. Hemodynamics c/w with severe RV failure and severe TR  Plan/Discussion:  Continue treatment of RV infarct. Will review LM lesion with IC team. Currently stable and no urgent need for revascularization.  Toribio Fuel, MD 11:11 AM  Findings Coronary Findings Diagnostic  Dominance: Right  Left Main Ost LM lesion is 60% stenosed.  Left Anterior Descending Prox LAD to Mid LAD lesion is 60% stenosed.  Ramus Intermedius Ramus lesion is 90% stenosed.  Left Circumflex Ost Cx lesion is 50% stenosed.  First Obtuse Marginal Branch  Right Coronary Artery Ost RCA to Prox RCA lesion is 100% stenosed.  Intervention  No interventions have been documented.     ECHOCARDIOGRAM  ECHOCARDIOGRAM COMPLETE 07/13/2024  Narrative ECHOCARDIOGRAM REPORT    Patient Name:   Amy [name] Date of Exam: 07/13/2024 Medical Rec #:  [id number]      Height:       57.5 in Accession #:    [id number]     Weight:       156.0 lb Date of Birth:  [birth year]      BSA:          1.629 m Patient Age:    78 years       BP:           120/62 mmHg Patient Gender: F              HR:            98 bpm. Exam Location:  [address]  Procedure: 2D Echo and Intracardiac Opacification Agent (Both Spectral and Color Flow Doppler were utilized during procedure).  Indications:    I35.0 Nonrheumatic aortic (valve) stenosis  History:        Patient has prior history of Echocardiogram examinations, most recent 07/31/2023. CHF, CAD and Previous Myocardial Infarction, Arrythmias:LBBB; Risk Factors:Hypertension and Dyslipidemia. Prediabetes. Aortic atherosclerosis. Obesity.  Sonographer:    Jon Hacker RCS Referring Phys: [id number] Kieron Kantner K Milayah Krell  IMPRESSIONS   1. Akinesis of the inferolateral and inferoseptal walls; overall EF 40-45. Calcified aortic valve with severe AS (mean gradient 26.5 mmHg; AVA 0.93 cm2); trace AI. 2. Left ventricular ejection fraction, by estimation, is 40 to 45%. The left ventricle has mildly decreased function. The left ventricle demonstrates regional wall motion abnormalities (see scoring diagram/findings for description). Left ventricular diastolic parameters are consistent with Grade I diastolic dysfunction (impaired relaxation). There is the interventricular septum is flattened in systole and diastole, consistent with right ventricular  pressure and volume overload. 3. Right ventricular systolic function is normal. The right ventricular size is mildly enlarged. 4. The mitral valve is normal in structure. Mild mitral valve regurgitation. No evidence of mitral stenosis. Moderate mitral annular calcification. 5. Tricuspid valve regurgitation is moderate to severe. 6. The aortic valve is calcified. Aortic valve regurgitation is trivial. Severe aortic valve stenosis. 7. The inferior vena cava is normal in size with greater than 50% respiratory variability, suggesting right atrial pressure of 3 mmHg.  FINDINGS Left Ventricle: Left ventricular ejection fraction, by estimation, is 40 to 45%. The left ventricle has mildly decreased function. The left ventricle  demonstrates regional wall motion abnormalities. Definity  contrast agent was given IV to delineate the left ventricular endocardial borders. The left ventricular internal cavity size was normal in size. There is no left ventricular hypertrophy. The interventricular septum is flattened in systole and diastole, consistent with right ventricular pressure and volume overload. Left ventricular diastolic parameters are consistent with Grade I diastolic dysfunction (impaired relaxation).  Right Ventricle: The right ventricular size is mildly enlarged. Right ventricular systolic function is normal.  Left Atrium: Left atrial size was normal in size.  Right Atrium: Right atrial size was normal in size.  Pericardium: There is no evidence of pericardial effusion.  Mitral Valve: The mitral valve is normal in structure. Moderate mitral annular calcification. Mild mitral valve regurgitation. No evidence of mitral valve stenosis.  Tricuspid Valve: The tricuspid valve is normal in structure. Tricuspid valve regurgitation is moderate to severe. No evidence of tricuspid stenosis.  Aortic Valve: The aortic valve is calcified. Aortic valve regurgitation is trivial. Severe aortic stenosis is present. Aortic valve mean gradient measures 26.5 mmHg. Aortic valve peak gradient measures 49.6 mmHg. Aortic valve area, by VTI measures 0.93 cm.  Pulmonic Valve: The pulmonic valve was normal in structure. Pulmonic valve regurgitation is trivial. No evidence of pulmonic stenosis.  Aorta: The aortic root is normal in size and structure.  Venous: The inferior vena cava is normal in size with greater than 50% respiratory variability, suggesting right atrial pressure of 3 mmHg.  IAS/Shunts: No atrial level shunt detected by color flow Doppler.  Additional Comments: Akinesis of the inferolateral and inferoseptal walls; overall EF 40-45. Calcified aortic valve with severe AS (mean gradient 26.5 mmHg; AVA 0.93 cm2); trace  AI.   LEFT VENTRICLE PLAX 2D LVIDd:         5.34 cm   Diastology LVIDs:         4.58 cm   LV e' medial:    5.33 cm/s LV PW:         0.95 cm   LV E/e' medial:  12.6 LV IVS:        0.99 cm   LV e' lateral:   7.18 cm/s LVOT diam:     2.00 cm   LV E/e' lateral: 9.3 LV SV:         71 LV SV Index:   44 LVOT Area:     3.14 cm   RIGHT VENTRICLE RV Basal diam:  4.52 cm RV S prime:     10.80 cm/s TAPSE (M-mode): 2.2 cm RVSP:           46.3 mmHg  LEFT ATRIUM           Index        RIGHT ATRIUM           Index LA diam:      3.30 cm 2.03 cm/m  RA Pressure: 3.00 mmHg LA Vol (A4C): 20.2 ml 12.40 ml/m  RA Area:     17.80 cm RA Volume:   45.70 ml  28.06 ml/m AORTIC VALVE AV Area (Vmax):    1.06 cm AV Area (Vmean):   1.17 cm AV Area (VTI):     0.93 cm AV Vmax:           352.00 cm/s AV Vmean:          232.500 cm/s AV VTI:            0.766 m AV Peak Grad:      49.6 mmHg AV Mean Grad:      26.5 mmHg LVOT Vmax:         119.00 cm/s LVOT Vmean:        86.500 cm/s LVOT VTI:          0.227 m LVOT/AV VTI ratio: 0.30  AORTA Ao Root diam: 2.70 cm Ao Asc diam:  3.10 cm  MV E velocity: 67.10 cm/s   TRICUSPID VALVE MV A velocity: 104.00 cm/s  TR Peak grad:   43.3 mmHg MV E/A ratio:  0.65         TR Vmax:        329.00 cm/s Estimated RAP:  3.00 mmHg RVSP:           46.3 mmHg  SHUNTS Systemic VTI:  0.23 m Systemic Diam: 2.00 cm  Redell Shallow MD Electronically signed by Redell Shallow MD Signature Date/Time: 07/13/2024/4:56:42 PM    Final    MONITORS  LONG TERM MONITOR (3-14 DAYS) 11/24/2023  Narrative Patch Wear Time:  13 days and 23 hours (2024-12-10T11:48:08-499 to 2024-12-24T11:48:12-498)  1. Sinus rhythm -  avg HR of 85 bpm. 2. One run of nonsustained Ventricular Tachycardia occurred lasting 4 beats with a max rate of 164 bpm (avg 131 bpm). 3. Two 2 early am pauses occurred (back to back), the longest  lasting 4.8 secs (12 bpm) the other was 3.4 sec 4. Frequent  PVCs (9.1%, [id number]), 5. Ventricular Bigeminy and Trigeminy were present. Inverted QRS complexes possibly due to inverted placement of device.  Toribio Fuel, MD 4:03 PM       ______________________________________________________________________________________________      07/31/2023: Platelets 237 08/11/2023: Hemoglobin 15.0; Hemoglobin 13.6 10/27/2023: B Natriuretic Peptide 657.0; BUN 19; Creatinine, Ser 0.95; Potassium 4.0; Sodium 137   STS RISK CALCULATOR: Pending  NHYA CLASS: 2    ASSESSMENT AND PLAN:   1. Nonrheumatic aortic valve stenosis   2. Chronic combined systolic and diastolic heart failure (HCC)   3. Right ventricular dysfunction   4. Right bundle branch block   5. Tricuspid valve insufficiency, unspecified etiology   6. Coronary artery disease involving native coronary artery of native heart without angina pectoris   7. Primary hypertension   8. Hyperlipidemia LDL goal <55   9. Aortic atherosclerosis (HCC)   10. Prediabetes   11. BMI 34.0-34.9,adult      Aortic stenosis: The patient has developed NYHA class II symptoms of fatigue.  Her echocardiogram demonstrates moderate to severe aortic stenosis with a dimensionless index of 0.3 with a highly calcified valve with restricted motion.  Will refer for coronary angiography, right heart catheterization (with Dr. Fuel), CT scan, and cardiothoracic surgical opinion.  The patient has developed dental issues and is seeing a dentist.  I told her that her dental issues would need to be managed prior to aortic valve intervention. Systolic and diastolic heart failure: Managed by  advanced heart failure; continue Jardiance  10 mg daily, torsemide  20 mg a day; off of Entresto  due to hyperkalemia and Toprol  due to hypotension and dizziness. RV dysfunction: PA pulsatility index normal and PVR not prohibitive. Right bundle branch block: Presence of right bundle branch block patient's risk for permanent pacemaker for TAVR  procedure is pursued.  A TAVR protocol CTA and will help adjudicate the risk this. Tricuspid regurgitation: No significant peripheral edema. Coronary artery disease: Continue medical management with aspirin  81 mg daily, Lipitor  80 mg Hypertension: BP is well-controlled today Hyperlipidemia: Continue atorvastatin  80 mg Aortic atherosclerosis: Continue aspirin  81 mg Lipitor  80 mg.  Strict blood pressure control. Prediabetes: Continue Jardiance  10 mg daily, strict control of cardiovascular risk factors Elevated BMI: BSA under 2 when considering aortic valve intervention.   I spent 38 minutes reviewing all clinical data during and prior to this visit including all relevant imaging studies, laboratories, clinical information from other health systems and prior notes from both Cardiology and other specialties, interviewing the patient, conducting a complete physical examination, and coordinating care in order to formulate a comprehensive and personalized evaluation and treatment plan.      Jameeka Marcy K Avante Carneiro, MD  07/21/2024 4:07 PM    [facility] [address] [place], [place], [place]  [address] Phone: [phone]; Fax: [phone]

## 2024-07-13 ENCOUNTER — Ambulatory Visit (HOSPITAL_COMMUNITY)
Admission: RE | Admit: 2024-07-13 | Discharge: 2024-07-13 | Disposition: A | Discharge status: Home / Self Care | Source: Ambulatory Visit | Attending: Cardiology | Admitting: Cardiology

## 2024-07-13 ENCOUNTER — Ambulatory Visit: Payer: Self-pay | Admitting: Internal Medicine

## 2024-07-13 DIAGNOSIS — I350 Nonrheumatic aortic (valve) stenosis: Secondary | ICD-10-CM | POA: Diagnosis not present

## 2024-07-13 LAB — ECHOCARDIOGRAM COMPLETE
AR max vel: 1.06 cm2
AV Area VTI: 0.93 cm2
AV Area mean vel: 1.17 cm2
AV Mean grad: 26.5 mmHg
AV Peak grad: 49.6 mmHg
Ao pk vel: 3.52 m/s
S' Lateral: 4.58 cm

## 2024-07-13 MED ORDER — PERFLUTREN LIPID MICROSPHERE
1.0000 mL | INTRAVENOUS | Status: AC | PRN
Start: 2024-07-13 — End: 2024-07-13
  Administered 2024-07-13: 2 mL via INTRAVENOUS

## 2024-07-20 ENCOUNTER — Ambulatory Visit (HOSPITAL_BASED_OUTPATIENT_CLINIC_OR_DEPARTMENT_OTHER): Admitting: Cardiology

## 2024-07-21 ENCOUNTER — Ambulatory Visit: Attending: Internal Medicine | Admitting: Internal Medicine

## 2024-07-21 ENCOUNTER — Encounter: Payer: Self-pay | Admitting: Internal Medicine

## 2024-07-21 VITALS — BP 120/70 | HR 64 | Ht <= 58 in | Wt 152.0 lb

## 2024-07-21 DIAGNOSIS — I4510 Unspecified right bundle-branch block: Secondary | ICD-10-CM | POA: Diagnosis not present

## 2024-07-21 DIAGNOSIS — I10 Essential (primary) hypertension: Secondary | ICD-10-CM

## 2024-07-21 DIAGNOSIS — I5042 Chronic combined systolic (congestive) and diastolic (congestive) heart failure: Secondary | ICD-10-CM

## 2024-07-21 DIAGNOSIS — I700 Atherosclerosis of aorta: Secondary | ICD-10-CM

## 2024-07-21 DIAGNOSIS — I2510 Atherosclerotic heart disease of native coronary artery without angina pectoris: Secondary | ICD-10-CM | POA: Diagnosis not present

## 2024-07-21 DIAGNOSIS — E785 Hyperlipidemia, unspecified: Secondary | ICD-10-CM | POA: Diagnosis not present

## 2024-07-21 DIAGNOSIS — Z6834 Body mass index (BMI) 34.0-34.9, adult: Secondary | ICD-10-CM

## 2024-07-21 DIAGNOSIS — R7303 Prediabetes: Secondary | ICD-10-CM

## 2024-07-21 DIAGNOSIS — Z01812 Encounter for preprocedural laboratory examination: Secondary | ICD-10-CM

## 2024-07-21 DIAGNOSIS — I350 Nonrheumatic aortic (valve) stenosis: Secondary | ICD-10-CM | POA: Diagnosis not present

## 2024-07-21 DIAGNOSIS — I071 Rheumatic tricuspid insufficiency: Secondary | ICD-10-CM

## 2024-07-21 DIAGNOSIS — I519 Heart disease, unspecified: Secondary | ICD-10-CM

## 2024-07-21 NOTE — Patient Instructions (Signed)
 Medication Instructions:  Your physician recommends that you continue on your current medications as directed. Please refer to the Current Medication list given to you today.  *If you need a refill on your cardiac medications before your next appointment, please call your pharmacy*  Lab Work: TODAY: CBC, BMET If you have labs (blood work) drawn today and your tests are completely normal, you will receive your results only by: MyChart Message (if you have MyChart) OR A paper copy in the mail If you have any lab test that is abnormal or we need to change your treatment, we will call you to review the results.  Testing/Procedures: Your physician has requested that you have a cardiac catheterization. Cardiac catheterization is used to diagnose and/or treat various heart conditions. Doctors may recommend this procedure for a number of different reasons. The most common reason is to evaluate chest pain. Chest pain can be a symptom of coronary artery disease (CAD), and cardiac catheterization can show whether plaque is narrowing or blocking your heart's arteries. This procedure is also used to evaluate the valves, as well as measure the blood flow and oxygen levels in different parts of your heart. For further information please visit [email or web address] Please follow instruction sheet, as given.  Follow-Up: At [facility], you and your health needs are our priority.  As part of our continuing mission to provide you with exceptional heart care, our providers are all part of one team.  This team includes your primary Cardiologist (physician) and Advanced Practice Providers or APPs (Physician Assistants and Nurse Practitioners) who all work together to provide you with the care you need, when you need it.  Your next appointment:   The structural team will arrange follow-up after your procedure.  Provider:   Lurena Red, MD  We recommend signing up for the patient portal called MyChart.   Sign up information is provided on this After Visit Summary.  MyChart is used to connect with patients for Virtual Visits (Telemedicine).  Patients are able to view lab/test results, encounter notes, upcoming appointments, etc.  Non-urgent messages can be sent to your provider as well.    To learn more about what you can do with MyChart, go to [email or web address].

## 2024-07-21 NOTE — Progress Notes (Signed)
 Pre Surgical Assessment: 5 M Walk Test  57M=16.81ft  5 Meter Walk Test- trial 1: 11.24 seconds 5 Meter Walk Test- trial 2: 11.1 seconds 5 Meter Walk Test- trial 3: 11.15 seconds 5 Meter Walk Test Average: 11.16 seconds

## 2024-07-22 ENCOUNTER — Encounter: Payer: Self-pay | Admitting: Physician Assistant

## 2024-07-27 ENCOUNTER — Other Ambulatory Visit (HOSPITAL_COMMUNITY)

## 2024-07-27 DIAGNOSIS — I350 Nonrheumatic aortic (valve) stenosis: Secondary | ICD-10-CM | POA: Diagnosis not present

## 2024-07-27 DIAGNOSIS — I10 Essential (primary) hypertension: Secondary | ICD-10-CM | POA: Diagnosis not present

## 2024-07-27 DIAGNOSIS — Z01812 Encounter for preprocedural laboratory examination: Secondary | ICD-10-CM | POA: Diagnosis not present

## 2024-07-27 LAB — BASIC METABOLIC PANEL WITH GFR

## 2024-07-29 ENCOUNTER — Encounter (HOSPITAL_COMMUNITY)
Admission: RE | Disposition: A | Discharge status: Home / Self Care | Source: Home / Self Care | Attending: Internal Medicine

## 2024-07-29 ENCOUNTER — Other Ambulatory Visit: Payer: Self-pay | Admitting: Cardiology

## 2024-07-29 ENCOUNTER — Other Ambulatory Visit: Payer: Self-pay

## 2024-07-29 ENCOUNTER — Ambulatory Visit (HOSPITAL_COMMUNITY)
Admission: RE | Admit: 2024-07-29 | Discharge: 2024-07-29 | Disposition: A | Discharge status: Home / Self Care | Attending: Internal Medicine | Admitting: Internal Medicine

## 2024-07-29 DIAGNOSIS — I5042 Chronic combined systolic (congestive) and diastolic (congestive) heart failure: Secondary | ICD-10-CM

## 2024-07-29 DIAGNOSIS — I2582 Chronic total occlusion of coronary artery: Secondary | ICD-10-CM | POA: Insufficient documentation

## 2024-07-29 DIAGNOSIS — I452 Bifascicular block: Secondary | ICD-10-CM | POA: Insufficient documentation

## 2024-07-29 DIAGNOSIS — I2510 Atherosclerotic heart disease of native coronary artery without angina pectoris: Secondary | ICD-10-CM | POA: Diagnosis not present

## 2024-07-29 DIAGNOSIS — Z7982 Long term (current) use of aspirin: Secondary | ICD-10-CM | POA: Diagnosis not present

## 2024-07-29 DIAGNOSIS — I110 Hypertensive heart disease with heart failure: Secondary | ICD-10-CM | POA: Diagnosis not present

## 2024-07-29 DIAGNOSIS — E669 Obesity, unspecified: Secondary | ICD-10-CM | POA: Diagnosis not present

## 2024-07-29 DIAGNOSIS — I083 Combined rheumatic disorders of mitral, aortic and tricuspid valves: Secondary | ICD-10-CM | POA: Diagnosis not present

## 2024-07-29 DIAGNOSIS — Z79899 Other long term (current) drug therapy: Secondary | ICD-10-CM | POA: Insufficient documentation

## 2024-07-29 DIAGNOSIS — E785 Hyperlipidemia, unspecified: Secondary | ICD-10-CM | POA: Insufficient documentation

## 2024-07-29 DIAGNOSIS — Z6834 Body mass index (BMI) 34.0-34.9, adult: Secondary | ICD-10-CM | POA: Insufficient documentation

## 2024-07-29 DIAGNOSIS — R7303 Prediabetes: Secondary | ICD-10-CM | POA: Diagnosis not present

## 2024-07-29 DIAGNOSIS — Z7984 Long term (current) use of oral hypoglycemic drugs: Secondary | ICD-10-CM | POA: Insufficient documentation

## 2024-07-29 DIAGNOSIS — I700 Atherosclerosis of aorta: Secondary | ICD-10-CM | POA: Insufficient documentation

## 2024-07-29 DIAGNOSIS — I350 Nonrheumatic aortic (valve) stenosis: Secondary | ICD-10-CM | POA: Diagnosis not present

## 2024-07-29 DIAGNOSIS — Z7902 Long term (current) use of antithrombotics/antiplatelets: Secondary | ICD-10-CM | POA: Insufficient documentation

## 2024-07-29 DIAGNOSIS — I062 Rheumatic aortic stenosis with insufficiency: Secondary | ICD-10-CM

## 2024-07-29 HISTORY — PX: RIGHT HEART CATH AND CORONARY ANGIOGRAPHY: CATH118264

## 2024-07-29 LAB — CBC
Hematocrit: 45 % (ref 34.0–46.6)
Hemoglobin: 15.1 g/dL (ref 11.1–15.9)
MCH: 32.5 pg (ref 26.6–33.0)
MCHC: 33.6 g/dL (ref 31.5–35.7)
MCV: 97 fL (ref 79–97)
Platelets: 195 x10E3/uL (ref 150–450)
RBC: 4.64 x10E6/uL (ref 3.77–5.28)
RDW: 14.1 % (ref 11.7–15.4)
WBC: 6.3 x10E3/uL (ref 3.4–10.8)

## 2024-07-29 LAB — POCT I-STAT 7, (LYTES, BLD GAS, ICA,H+H)
Acid-base deficit: 1 mmol/L (ref 0.0–2.0)
Bicarbonate: 23.4 mmol/L (ref 20.0–28.0)
Calcium, Ion: 1.22 mmol/L (ref 1.15–1.40)
HCT: 41 % (ref 36.0–46.0)
Hemoglobin: 13.9 g/dL (ref 12.0–15.0)
O2 Saturation: 89 %
Potassium: 4.1 mmol/L (ref 3.5–5.1)
Sodium: 141 mmol/L (ref 135–145)
TCO2: 24 mmol/L (ref 22–32)
pCO2 arterial: 37.6 mmHg (ref 32–48)
pH, Arterial: 7.401 (ref 7.35–7.45)
pO2, Arterial: 55 mmHg — ABNORMAL LOW (ref 83–108)

## 2024-07-29 LAB — POCT I-STAT EG7
Acid-base deficit: 3 mmol/L — ABNORMAL HIGH (ref 0.0–2.0)
Bicarbonate: 22.4 mmol/L (ref 20.0–28.0)
Calcium, Ion: 1.03 mmol/L — ABNORMAL LOW (ref 1.15–1.40)
HCT: 36 % (ref 36.0–46.0)
Hemoglobin: 12.2 g/dL (ref 12.0–15.0)
O2 Saturation: 58 %
Potassium: 3.3 mmol/L — ABNORMAL LOW (ref 3.5–5.1)
Sodium: 137 mmol/L (ref 135–145)
TCO2: 24 mmol/L (ref 22–32)
pCO2, Ven: 42 mmHg — ABNORMAL LOW (ref 44–60)
pH, Ven: 7.335 (ref 7.25–7.43)
pO2, Ven: 32 mmHg (ref 32–45)

## 2024-07-29 LAB — BASIC METABOLIC PANEL WITH GFR
Anion gap: 9 (ref 5–15)
BUN: 17 mg/dL (ref 8–23)
CO2: 24 mmol/L (ref 22–32)
Calcium: 9 mg/dL (ref 8.9–10.3)
Chloride: 103 mmol/L (ref 98–111)
Creatinine, Ser: 0.71 mg/dL (ref 0.44–1.00)
GFR, Estimated: >60 mL/min (ref >60)
Glucose, Bld: 103 mg/dL — ABNORMAL HIGH (ref 70–99)
Potassium: 4.5 mmol/L (ref 3.5–5.1)
Sodium: 136 mmol/L (ref 135–145)

## 2024-07-29 LAB — GLUCOSE, CAPILLARY: Glucose-Capillary: 96 mg/dL (ref 70–99)

## 2024-07-29 SURGERY — RIGHT HEART CATH AND CORONARY ANGIOGRAPHY
Anesthesia: LOCAL

## 2024-07-29 MED ORDER — LABETALOL HCL 5 MG/ML IV SOLN: 10.0000 mg | INTRAVENOUS | Status: DC | PRN

## 2024-07-29 MED ORDER — HEPARIN (PORCINE) IN NACL 1000-0.9 UT/500ML-% IV SOLN
INTRAVENOUS | Status: DC | PRN
  Administered 2024-07-29 (×2): 500 mL

## 2024-07-29 MED ORDER — FENTANYL CITRATE (PF) 100 MCG/2ML IJ SOLN
INTRAMUSCULAR | Status: AC
  Filled 2024-07-29: qty 2

## 2024-07-29 MED ORDER — FREE WATER: 500.0000 mL | Freq: Once | Status: DC

## 2024-07-29 MED ORDER — TORSEMIDE 20 MG PO TABS: 10.0000 mg | ORAL_TABLET | Freq: Every day | ORAL | 6 refills | Status: AC

## 2024-07-29 MED ORDER — SODIUM CHLORIDE 0.9% FLUSH: 3.0000 mL | Freq: Two times a day (BID) | INTRAVENOUS | Status: DC

## 2024-07-29 MED ORDER — ASPIRIN 81 MG PO CHEW: 81.0000 mg | CHEWABLE_TABLET | ORAL | Status: DC

## 2024-07-29 MED ORDER — LIDOCAINE HCL (PF) 1 % IJ SOLN
INTRAMUSCULAR | Status: DC | PRN
  Administered 2024-07-29 (×3): 2 mL via INTRADERMAL

## 2024-07-29 MED ORDER — MIDAZOLAM HCL 2 MG/2ML IJ SOLN
INTRAMUSCULAR | Status: DC | PRN
  Administered 2024-07-29: 1 mg via INTRAVENOUS

## 2024-07-29 MED ORDER — VERAPAMIL HCL 2.5 MG/ML IV SOLN
INTRAVENOUS | Status: DC | PRN
  Administered 2024-07-29: 10 mL via INTRA_ARTERIAL

## 2024-07-29 MED ORDER — MIDAZOLAM HCL 2 MG/2ML IJ SOLN
INTRAMUSCULAR | Status: AC
  Filled 2024-07-29: qty 2

## 2024-07-29 MED ORDER — FENTANYL CITRATE (PF) 100 MCG/2ML IJ SOLN
INTRAMUSCULAR | Status: DC | PRN
  Administered 2024-07-29: 25 ug via INTRAVENOUS

## 2024-07-29 MED ORDER — HEPARIN SODIUM (PORCINE) 1000 UNIT/ML IJ SOLN
INTRAMUSCULAR | Status: DC | PRN
  Administered 2024-07-29: 5000 [IU] via INTRA_ARTERIAL

## 2024-07-29 MED ORDER — SODIUM CHLORIDE 0.9 % IV SOLN: 250.0000 mL | INTRAVENOUS | Status: DC | PRN

## 2024-07-29 MED ORDER — VERAPAMIL HCL 2.5 MG/ML IV SOLN
INTRAVENOUS | Status: AC
  Filled 2024-07-29: qty 2

## 2024-07-29 MED ORDER — SODIUM CHLORIDE 0.9% FLUSH: 3.0000 mL | INTRAVENOUS | Status: DC | PRN

## 2024-07-29 MED ORDER — HYDRALAZINE HCL 20 MG/ML IJ SOLN: 10.0000 mg | INTRAMUSCULAR | Status: DC | PRN

## 2024-07-29 MED ORDER — FREE WATER: 250.0000 mL | Freq: Once | Status: DC

## 2024-07-29 MED ORDER — ONDANSETRON HCL 4 MG/2ML IJ SOLN: 4.0000 mg | Freq: Four times a day (QID) | INTRAMUSCULAR | Status: DC | PRN

## 2024-07-29 MED ORDER — HEPARIN SODIUM (PORCINE) 1000 UNIT/ML IJ SOLN
INTRAMUSCULAR | Status: AC
  Filled 2024-07-29: qty 10

## 2024-07-29 MED ORDER — ACETAMINOPHEN 325 MG PO TABS: 650.0000 mg | ORAL_TABLET | ORAL | Status: DC | PRN

## 2024-07-29 MED ORDER — LIDOCAINE HCL (PF) 1 % IJ SOLN
INTRAMUSCULAR | Status: AC
  Filled 2024-07-29: qty 30

## 2024-07-29 SURGICAL SUPPLY — 12 items
CATH BALLN WEDGE 5F 110CM (CATHETERS) IMPLANT
CATH INFINITI 5FR ANG PIGTAIL (CATHETERS) IMPLANT
CATH INFINITI AMBI 6FR TG (CATHETERS) IMPLANT
GLIDESHEATH SLEND SS 6F .021 (SHEATH) IMPLANT
GUIDEWIRE INQWIRE 1.5J.035X260 (WIRE) IMPLANT
PACK CARDIAC CATHETERIZATION (CUSTOM PROCEDURE TRAY) ×2 IMPLANT
SET ATX-X65L (MISCELLANEOUS) IMPLANT
SHEATH GLIDE SLENDER 4/5FR (SHEATH) IMPLANT
SHEATH PROBE COVER 6X72 (BAG) IMPLANT
WIRE HI TORQ VERSACORE-J 145CM (WIRE) IMPLANT
WIRE MICRO SET SILHO 5FR 7 (SHEATH) IMPLANT
WIRE MICROINTRODUCER 60CM (WIRE) IMPLANT

## 2024-07-29 NOTE — Interval H&P Note (Signed)
 History and Physical Interval Note:  07/29/2024 1:09 PM  Amy [name]  has presented today for surgery, with the diagnosis of aortic stenosis.  The various methods of treatment have been discussed with the patient and family. After consideration of risks, benefits and other options for treatment, the patient has consented to  Procedure(s): RIGHT/LEFT HEART CATH AND CORONARY ANGIOGRAPHY (N/A) as a surgical intervention.  The patient's history has been reviewed, patient examined, no change in status, stable for surgery.  I have reviewed the patient's chart and labs.  Questions were answered to the patient's satisfaction.     Darryl Willner K Amanda Pote

## 2024-07-29 NOTE — Progress Notes (Signed)
 Ordering BMET for patient to get in 1 week

## 2024-07-29 NOTE — Discharge Instructions (Addendum)
 Please come to our office sometime next week to get labs drawn.  Drink plenty of fluids for 48 hours and keep wrist elevated at heart level for 24 hours  Radial Site Care   This sheet gives you information about how to care for yourself after your procedure. Your health care provider may also give you more specific instructions. If you have problems or questions, contact your health care provider. What can I expect after the procedure? After the procedure, it is common to have: Bruising and tenderness at the catheter insertion area. Follow these instructions at home: Medicines Take over-the-counter and prescription medicines only as told by your health care provider. Insertion site care Follow instructions from your health care provider about how to take care of your insertion site. Make sure you: Wash your hands with soap and water  before you change your bandage (dressing). If soap and water  are not available, use hand sanitizer. Remove your dressing as told by your health care provider. In 24 hours Check your insertion site every day for signs of infection. Check for: Redness, swelling, or pain. Fluid or blood. Pus or a bad smell. Warmth. Do not take baths, swim, or use a hot tub until your health care provider approves. You may shower 24-48 hours after the procedure, or as directed by your health care provider. Remove the dressing and gently wash the site with plain soap and water . Pat the area dry with a clean towel. Do not rub the site. That could cause bleeding. Do not apply powder or lotion to the site. Activity   For 24 hours after the procedure, or as directed by your health care provider: Do not flex or bend the affected arm. Do not push or pull heavy objects with the affected arm. Do not drive yourself home from the hospital or clinic. You may drive 24 hours after the procedure unless your health care provider tells you not to. Do not operate machinery or power  tools. Do not lift anything that is heavier than 10 lb (4.5 kg), or the limit that you are told, until your health care provider says that it is safe.  For 4 days Ask your health care provider when it is okay to: Return to work or school. Resume usual physical activities or sports. Resume sexual activity. General instructions If the catheter site starts to bleed, raise your arm and put firm pressure on the site. If the bleeding does not stop, get help right away. This is a medical emergency. If you went home on the same day as your procedure, a responsible adult should be with you for the first 24 hours after you arrive home. Keep all follow-up visits as told by your health care provider. This is important. Contact a health care provider if: You have a fever. You have redness, swelling, or yellow drainage around your insertion site. Get help right away if: You have unusual pain at the radial site. The catheter insertion area swells very fast. The insertion area is bleeding, and the bleeding does not stop when you hold steady pressure on the area. Your arm or hand becomes pale, cool, tingly, or numb. These symptoms may represent a serious problem that is an emergency. Do not wait to see if the symptoms will go away. Get medical help right away. Call your local emergency services (911 in the [place] Do not drive yourself to the hospital. Summary After the procedure, it is common to have bruising and tenderness at the site. Follow  instructions from your health care provider about how to take care of your radial site wound. Check the wound every day for signs of infection. Do not lift anything that is heavier than 10 lb (4.5 kg), or the limit that you are told, until your health care provider says that it is safe. This information is not intended to replace advice given to you by your health care provider. Make sure you discuss any questions you have with your health care provider. Document  Revised: 12/09/2017 Document Reviewed: 12/09/2017 [employer] Patient Education  2020 [employer]     Internal jugular Site Care   This sheet gives you information about how to care for yourself after your procedure. Your health care provider may also give you more specific instructions. If you have problems or questions, contact your health care provider. What can I expect after the procedure? After the procedure, it is common to have: Bruising and tenderness at the catheter insertion area. Follow these instructions at home:  Insertion site care Follow instructions from your health care provider about how to take care of your insertion site. Make sure you: Wash your hands with soap and water  before you change your bandage (dressing). If soap and water  are not available, use hand sanitizer. Remove your dressing as told by your health care provider. In 24 hours Check your insertion site every day for signs of infection. Check for: Redness, swelling, or pain. Pus or a bad smell. Warmth. You may shower 24-48 hours after the procedure. Do not apply powder or lotion to the site.  Activity For 24 hours after the procedure, or as directed by your health care provider: Do not push or pull heavy objects with on the effected side. Do not drive yourself home from the hospital or clinic. You may drive 24 hours after the procedure unless your health care provider tells you not to. Do not lift anything that is heavier than 10 lb (4.5 kg), or the limit that you are told, until your health care provider says that it is safe.  For 24 hours

## 2024-08-01 ENCOUNTER — Other Ambulatory Visit: Payer: Self-pay

## 2024-08-01 ENCOUNTER — Encounter (HOSPITAL_COMMUNITY): Payer: Self-pay | Admitting: Internal Medicine

## 2024-08-01 DIAGNOSIS — I062 Rheumatic aortic stenosis with insufficiency: Secondary | ICD-10-CM

## 2024-08-08 ENCOUNTER — Ambulatory Visit (HOSPITAL_COMMUNITY)
Admission: RE | Admit: 2024-08-08 | Discharge: 2024-08-08 | Disposition: A | Discharge status: Home / Self Care | Source: Ambulatory Visit | Attending: Cardiology | Admitting: Cardiology

## 2024-08-08 DIAGNOSIS — K449 Diaphragmatic hernia without obstruction or gangrene: Secondary | ICD-10-CM | POA: Diagnosis not present

## 2024-08-08 DIAGNOSIS — R918 Other nonspecific abnormal finding of lung field: Secondary | ICD-10-CM | POA: Diagnosis not present

## 2024-08-08 DIAGNOSIS — I350 Nonrheumatic aortic (valve) stenosis: Secondary | ICD-10-CM

## 2024-08-08 DIAGNOSIS — I062 Rheumatic aortic stenosis with insufficiency: Secondary | ICD-10-CM | POA: Insufficient documentation

## 2024-08-08 DIAGNOSIS — I701 Atherosclerosis of renal artery: Secondary | ICD-10-CM | POA: Diagnosis not present

## 2024-08-08 DIAGNOSIS — R911 Solitary pulmonary nodule: Secondary | ICD-10-CM | POA: Diagnosis not present

## 2024-08-08 DIAGNOSIS — I2510 Atherosclerotic heart disease of native coronary artery without angina pectoris: Secondary | ICD-10-CM | POA: Diagnosis not present

## 2024-08-08 DIAGNOSIS — Z01810 Encounter for preprocedural cardiovascular examination: Secondary | ICD-10-CM | POA: Diagnosis not present

## 2024-08-08 MED ORDER — IOHEXOL 350 MG/ML SOLN
100.0000 mL | Freq: Once | INTRAVENOUS | Status: AC | PRN
  Administered 2024-08-08: 100 mL via INTRAVENOUS

## 2024-08-08 NOTE — Progress Notes (Signed)
 Procedure Type: Isolated AVR Perioperative Outcome Estimate % Operative Mortality 11.4% Morbidity & Mortality 14.5% Stroke 1.22% Renal Failure 2.47% Reoperation 4.79% Prolonged Ventilation 9.77% Deep Sternal Wound Infection 0.074% Long Hospital Stay (>14 days) 5.62% Short Hospital Stay (<6 days)* 32.1%

## 2024-08-10 ENCOUNTER — Ambulatory Visit: Payer: Self-pay | Admitting: Internal Medicine

## 2024-08-24 ENCOUNTER — Ambulatory Visit: Attending: Surgery | Admitting: Surgery

## 2024-08-24 VITALS — BP 124/78 | HR 104 | Resp 18 | Ht <= 58 in | Wt 150.0 lb

## 2024-08-24 DIAGNOSIS — I062 Rheumatic aortic stenosis with insufficiency: Secondary | ICD-10-CM | POA: Diagnosis not present

## 2024-08-26 NOTE — Progress Notes (Signed)
 Patient ID: Amy [name], female   DOB: 1945-12-16, 78 y.o.   MRN: [id number]  [facility]       [address]       [name] [address]             [phone]          CARDIOTHORACIC SURGERY CONSULTATION REPORT  PCP is Arloa Elsie SAUNDERS, MD Referring Provider is Lurena Red, MD Primary Cardiologist is Shelda Bruckner, MD  Reason for consultation: Severe aortic stenosis  HPI:  The patient is a 78 year old woman with a history of hypertension, hyperlipidemia, diabetes, coronary artery disease status post STEMI in April 2023, severe tricuspid regurgitation by echocardiogram in 2025, right bundle branch block, and severe aortic stenosis who was referred for consideration of TAVR.  She had a late presentation IMI and April 2023 with a reduced ejection fraction of 30 to 35%.  She was supported with inotropes and developed a transient second-degree Mobitz type II block which resolved.  She was started on GDMT with improvement in her ejection fraction to 40 to 45%.  She has been followed in the advanced heart failure clinic with NYHA class II symptoms.  Her most recent echo on 07/13/2024 showed a calcified aortic valve with a mean gradient of 26.5 mmHg and a peak of 50 mmHg.  Aortic valve area by VTI was 0.93 cm.  Stroke-volume index was 44 with a dimensionless index of 0.3.  There was mild MR and moderate to severe TR.  Left ventricular ejection fraction was 40 to 45%.  Over the last few months she has had increased fatigue and shortness of breath with activity around her house.  She has had no chest pain or pressure.  She has had dizziness but no syncope.  She denies orthopnea and peripheral edema.    Past Medical History:  Diagnosis Date   CHF (congestive heart failure) (HCC)    Coronary artery disease    GERD (gastroesophageal reflux disease)    Hyperlipidemia    Obesity    Pre-diabetes     Past Surgical  History:  Procedure Laterality Date   CARDIAC CATHETERIZATION     CARPAL TUNNEL RELEASE Bilateral    FEMUR FRACTURE SURGERY     REPLACEMENT TOTAL KNEE BILATERAL     RIGHT HEART CATH AND CORONARY ANGIOGRAPHY N/A 07/29/2024   Procedure: RIGHT HEART CATH AND CORONARY ANGIOGRAPHY;  Surgeon: Thukkani, Arun K, MD;  Location: [facility];  Service: Cardiovascular;  Laterality: N/A;   RIGHT/LEFT HEART CATH AND CORONARY ANGIOGRAPHY N/A 10/22/2021   Procedure: RIGHT/LEFT HEART CATH AND CORONARY ANGIOGRAPHY;  Surgeon: Claudene Victory ORN, MD;  Location: [facility];  Service: Cardiovascular;  Laterality: N/A;   RIGHT/LEFT HEART CATH AND CORONARY ANGIOGRAPHY N/A 02/28/2022   Procedure: RIGHT/LEFT HEART CATH AND CORONARY ANGIOGRAPHY;  Surgeon: Cherrie Toribio SAUNDERS, MD;  Location: [facility];  Service: Cardiovascular;  Laterality: N/A;   RIGHT/LEFT HEART CATH AND CORONARY ANGIOGRAPHY N/A 08/11/2023   Procedure: RIGHT/LEFT HEART CATH AND CORONARY ANGIOGRAPHY;  Surgeon: Cherrie Toribio SAUNDERS, MD;  Location: [facility];  Service: Cardiovascular;  Laterality: N/A;   SHOULDER SURGERY Right    rotator cuff    Family History  Problem Relation Age of Onset   CAD Neg Hx     Social History   Socioeconomic History   Marital status: Widowed    Spouse name: Not on file  Number of children: Not on file   Years of education: 12   Highest education level: High school graduate  Occupational History   Occupation: retired  Tobacco Use   Smoking status: Never   Smokeless tobacco: Never  Vaping Use   Vaping status: Not on file  Substance and Sexual Activity   Alcohol  use: No   Drug use: No   Sexual activity: Not Currently  Other Topics Concern   Not on file  Social History Narrative   Not on file   Social Drivers of Health   Financial Resource Strain: Not on file  Food Insecurity: No Food Insecurity (07/24/2022)   Hunger Vital Sign    Worried About Running Out of Food in the Last  Year: Never true    Ran Out of Food in the Last Year: Never true  Transportation Needs: No Transportation Needs (07/24/2022)   PRAPARE - Administrator, Civil Service (Medical): No    Lack of Transportation (Non-Medical): No  Physical Activity: Not on file  Stress: Not on file  Social Connections: Not on file  Intimate Partner Violence: Not on file    Prior to Admission medications   Medication Sig Start Date End Date Taking? Authorizing Provider  acetaminophen  (TYLENOL ) 500 MG tablet Take 500 mg by mouth every 6 (six) hours as needed for mild pain or headache.    [provider]  aspirin  81 MG EC tablet Take 81 mg by mouth every evening.    [provider]  atorvastatin  (LIPITOR ) 80 MG tablet Take 1 tablet (80 mg total) by mouth daily. 10/24/21   Bryn Bernardino NOVAK, MD  B Complex-C (B-COMPLEX WITH VITAMIN C) tablet Take 1 tablet by mouth daily. 11/21/15   [provider]  BORON PO Take 5 mg by mouth daily.    [provider]  Calcium -Magnesium-Zinc (CAL-MAG-ZINC PO) Take 1 tablet by mouth 3 (three) times daily.    [provider]  Carboxymethylcellulose Sodium (DRY EYE RELIEF OP) Place 1 drop into both eyes 4 (four) times daily as needed (dryness).    [provider]  cetirizine (ZYRTEC) 10 MG tablet Take 10 mg by mouth daily.    [provider]  Cholecalciferol (VITAMIN D3) 10 MCG (400 UNIT) tablet Take 400 Units by mouth 2 (two) times daily.    [provider]  Coenzyme Q10 (CO Q-10) 200 MG CAPS Take 200 mg by mouth daily.    [provider]  DHEA 25 MG tablet Take 25 mg by mouth daily.    [provider]  empagliflozin  (JARDIANCE ) 10 MG TABS tablet Take 1 tablet (10 mg total) by mouth daily. PLEASE SCHEDULE APPOINTMENT FOR MORE REFILLS 06/16/24   Colletta Manuelita Garre, PA-C  Glucosamine-Chondroitin 750-600 MG TABS Take 1 tablet by mouth 2 (two) times daily.    [provider]  meclizine   (ANTIVERT ) 25 MG tablet Take 25 mg by mouth daily. 09/12/21   [provider]  Methylsulfonylmethane (MSM) 1000 MG CAPS Take 1,000 mg by mouth daily.    [provider]  Misc Natural Products (BEET ROOT) 500 MG CAPS Take 500 mg by mouth 3 (three) times daily.    [provider]  Omega-3 Fatty Acids (FISH OIL PO) Take 1 capsule by mouth 2 (two) times daily.    [provider]  OVER THE COUNTER MEDICATION Take 1 capsule by mouth daily. Cisco, Historical, MD  OVER THE COUNTER MEDICATION Place 1  Application into both nostrils daily as needed (congestion). Vicks Nasal Sticks    [provider]  potassium chloride  (KLOR-CON ) 10 MEQ tablet Take 1 tablet (10 mEq total) by mouth 3 (three) times a week. 10/30/23   Lee, [place], NP  Pramoxine-Dimethicone (GOLD BOND INTENSIVE HEALING EX) Apply 1 application  topically daily as needed (eczema).    [provider]  torsemide  (DEMADEX ) 20 MG tablet Take 0.5 tablets (10 mg total) by mouth daily. 07/29/24   Thukkani, Arun K, MD  vitamin E 180 MG (400 UNITS) capsule Take 400 Units by mouth daily.    [provider]    Current Outpatient Medications  Medication Sig Dispense Refill   acetaminophen  (TYLENOL ) 500 MG tablet Take 500 mg by mouth every 6 (six) hours as needed for mild pain or headache.     aspirin  81 MG EC tablet Take 81 mg by mouth every evening.     atorvastatin  (LIPITOR ) 80 MG tablet Take 1 tablet (80 mg total) by mouth daily. 30 tablet 0   B Complex-C (B-COMPLEX WITH VITAMIN C) tablet Take 1 tablet by mouth daily.     BORON PO Take 5 mg by mouth daily.     Calcium -Magnesium-Zinc (CAL-MAG-ZINC PO) Take 1 tablet by mouth 3 (three) times daily.     Carboxymethylcellulose Sodium (DRY EYE RELIEF OP) Place 1 drop into both eyes 4 (four) times daily as needed (dryness).     cetirizine (ZYRTEC) 10 MG tablet Take 10 mg by mouth daily.     Cholecalciferol (VITAMIN D3) 10 MCG (400  UNIT) tablet Take 400 Units by mouth 2 (two) times daily.     Coenzyme Q10 (CO Q-10) 200 MG CAPS Take 200 mg by mouth daily.     DHEA 25 MG tablet Take 25 mg by mouth daily.     empagliflozin  (JARDIANCE ) 10 MG TABS tablet Take 1 tablet (10 mg total) by mouth daily. PLEASE SCHEDULE APPOINTMENT FOR MORE REFILLS 90 tablet 0   Glucosamine-Chondroitin 750-600 MG TABS Take 1 tablet by mouth 2 (two) times daily.     meclizine  (ANTIVERT ) 25 MG tablet Take 25 mg by mouth daily.     Methylsulfonylmethane (MSM) 1000 MG CAPS Take 1,000 mg by mouth daily.     Misc Natural Products (BEET ROOT) 500 MG CAPS Take 500 mg by mouth 3 (three) times daily.     Omega-3 Fatty Acids (FISH OIL PO) Take 1 capsule by mouth 2 (two) times daily.     OVER THE COUNTER MEDICATION Take 1 capsule by mouth daily. [place]     OVER THE COUNTER MEDICATION Place 1 Application into both nostrils daily as needed (congestion). Vicks Nasal Sticks     potassium chloride  (KLOR-CON ) 10 MEQ tablet Take 1 tablet (10 mEq total) by mouth 3 (three) times a week. 15 tablet 3   Pramoxine-Dimethicone (GOLD BOND INTENSIVE HEALING EX) Apply 1 application  topically daily as needed (eczema).     torsemide  (DEMADEX ) 20 MG tablet Take 0.5 tablets (10 mg total) by mouth daily. 45 tablet 6   vitamin E 180 MG (400 UNITS) capsule Take 400 Units by mouth daily.     No current facility-administered medications for this visit.    Allergies  Allergen Reactions   Timolol Shortness Of Breath   Codeine Other (See Comments)    sick   Morphine And Codeine Nausea And Vomiting   Other Other (See Comments)    vicryl sutures - rash/redness, delay in healing  Review of Systems:   General:  Normal appetite, + decreased energy, no weight gain, no weight loss, no fever  Cardiac:  no chest pain with exertion, no chest pain at rest, +SOB with mild exertion, no resting SOB, no PND, no orthopnea, no palpitations, no arrhythmia, no atrial fibrillation, no LE  edema, + dizzy spells, no syncope  Respiratory:  + exertional shortness of breath, no home oxygen, no productive cough, no dry cough, no bronchitis, no wheezing, no hemoptysis, no asthma, no pain with inspiration or cough, no sleep apnea, no CPAP at night  GI:   no difficulty swallowing, no reflux, no frequent heartburn, no hiatal hernia, no abdominal pain, no constipation, no diarrhea, no hematochezia, no hematemesis, no melena  GU:   no dysuria,  no frequency, no urinary tract infection, no hematuria, no kidney stones, no kidney disease  Vascular:  no pain suggestive of claudication, no pain in feet, no leg cramps, no varicose veins, no DVT, no non-healing foot ulcer  Neuro:   no stroke, no TIA's, no seizures, no headaches, no temporary blindness one eye,  no slurred speech, no peripheral neuropathy, no chronic pain, + instability of gait, no memory/cognitive dysfunction  Musculoskeletal: + arthritis, no joint swelling, no myalgias, no difficulty walking, + reduced mobility   Skin:   no rash, no itching, no skin infections, no pressure sores or ulcerations  Psych:   no anxiety, no depression, no nervousness, no unusual recent stress  Eyes:   no blurry vision, no floaters, no recent vision changes, + wears glasses  ENT:   no hearing loss, no loose or painful teeth, no dentures, last saw dentist 10/6 for extraction  Hematologic:  no easy bruising, no abnormal bleeding, no clotting disorder, no frequent epistaxis  Endocrine:  + borderline diabetes, does not check CBG's at home     Physical Exam:   BP 124/78   Pulse (!) 104   Resp 18   Ht 4' 9 (1.448 m)   Wt 150 lb (68 kg)   SpO2 90%   BMI 32.46 kg/m   General:  Elderly,  well-appearing  HEENT:  Unremarkable, NCAT, PERLA, EOMI  Neck:   no JVD, no bruits, no adenopathy   Chest:   clear to auscultation, symmetrical breath sounds, no wheezes, no rhonchi   CV:   RRR, 3/6 systolic murmur RSB, no diastolic murmur  Abdomen:  soft, non-tender,  no masses   Extremities:  warm, well-perfused, pedal pulses palpable, no lower extremity edema  Rectal/GU  Deferred  Neuro:   Grossly non-focal and symmetrical throughout  Skin:   Clean and dry, no rashes, no breakdown  Diagnostic Tests:      ECHOCARDIOGRAM REPORT       Patient Name:   Amy [name] Date of Exam: 07/13/2024  Medical Rec #:  [id number]      Height:       57.5 in  Accession #:    [id number]     Weight:       156.0 lb  Date of Birth:  08/21/1946      BSA:          1.629 m  Patient Age:    78 years       BP:           120/62 mmHg  Patient Gender: F              HR:           98 bpm.  Exam Location:  [address]   Procedure: 2D Echo and Intracardiac Opacification Agent (Both Spectral and  Color            Flow Doppler were utilized during procedure).   Indications:    I35.0 Nonrheumatic aortic (valve) stenosis    History:        Patient has prior history of Echocardiogram examinations,  most                 recent 07/31/2023. CHF, CAD and Previous Myocardial  Infarction,                 Arrythmias:LBBB; Risk Factors:Hypertension and  Dyslipidemia.                 Prediabetes. Aortic atherosclerosis. Obesity.    Sonographer:    Jon Hacker RCS  Referring Phys: [id number] ARUN K THUKKANI   IMPRESSIONS     1. Akinesis of the inferolateral and inferoseptal walls; overall EF  40-45. Calcified aortic valve with severe AS (mean gradient 26.5 mmHg; AVA  0.93 cm2); trace AI.   2. Left ventricular ejection fraction, by estimation, is 40 to 45%. The  left ventricle has mildly decreased function. The left ventricle  demonstrates regional wall motion abnormalities (see scoring  diagram/findings for description). Left ventricular  diastolic parameters are consistent with Grade I diastolic dysfunction  (impaired relaxation). There is the interventricular septum is flattened  in systole and diastole, consistent with right ventricular pressure and  volume overload.   3.  Right ventricular systolic function is normal. The right ventricular  size is mildly enlarged.   4. The mitral valve is normal in structure. Mild mitral valve  regurgitation. No evidence of mitral stenosis. Moderate mitral annular  calcification.   5. Tricuspid valve regurgitation is moderate to severe.   6. The aortic valve is calcified. Aortic valve regurgitation is trivial.  Severe aortic valve stenosis.   7. The inferior vena cava is normal in size with greater than 50%  respiratory variability, suggesting right atrial pressure of 3 mmHg.   FINDINGS   Left Ventricle: Left ventricular ejection fraction, by estimation, is 40  to 45%. The left ventricle has mildly decreased function. The left  ventricle demonstrates regional wall motion abnormalities. Definity   contrast agent was given IV to delineate the  left ventricular endocardial borders. The left ventricular internal cavity  size was normal in size. There is no left ventricular hypertrophy. The  interventricular septum is flattened in systole and diastole, consistent  with right ventricular pressure and   volume overload. Left ventricular diastolic parameters are consistent  with Grade I diastolic dysfunction (impaired relaxation).   Right Ventricle: The right ventricular size is mildly enlarged. Right  ventricular systolic function is normal.   Left Atrium: Left atrial size was normal in size.   Right Atrium: Right atrial size was normal in size.   Pericardium: There is no evidence of pericardial effusion.   Mitral Valve: The mitral valve is normal in structure. Moderate mitral  annular calcification. Mild mitral valve regurgitation. No evidence of  mitral valve stenosis.   Tricuspid Valve: The tricuspid valve is normal in structure. Tricuspid  valve regurgitation is moderate to severe. No evidence of tricuspid  stenosis.   Aortic Valve: The aortic valve is calcified. Aortic valve regurgitation is  trivial. Severe  aortic stenosis is present. Aortic valve mean gradient  measures 26.5 mmHg. Aortic valve peak gradient measures 49.6 mmHg. Aortic  valve area, by VTI measures  0.93  cm.   Pulmonic Valve: The pulmonic valve was normal in structure. Pulmonic valve  regurgitation is trivial. No evidence of pulmonic stenosis.   Aorta: The aortic root is normal in size and structure.   Venous: The inferior vena cava is normal in size with greater than 50%  respiratory variability, suggesting right atrial pressure of 3 mmHg.   IAS/Shunts: No atrial level shunt detected by color flow Doppler.   Additional Comments: Akinesis of the inferolateral and inferoseptal walls;  overall EF 40-45. Calcified aortic valve with severe AS (mean gradient  26.5 mmHg; AVA 0.93 cm2); trace AI.     LEFT VENTRICLE  PLAX 2D  LVIDd:         5.34 cm   Diastology  LVIDs:         4.58 cm   LV e' medial:    5.33 cm/s  LV PW:         0.95 cm   LV E/e' medial:  12.6  LV IVS:        0.99 cm   LV e' lateral:   7.18 cm/s  LVOT diam:     2.00 cm   LV E/e' lateral: 9.3  LV SV:         71  LV SV Index:   44  LVOT Area:     3.14 cm     RIGHT VENTRICLE  RV Basal diam:  4.52 cm  RV S prime:     10.80 cm/s  TAPSE (M-mode): 2.2 cm  RVSP:           46.3 mmHg   LEFT ATRIUM           Index        RIGHT ATRIUM           Index  LA diam:      3.30 cm 2.03 cm/m   RA Pressure: 3.00 mmHg  LA Vol (A4C): 20.2 ml 12.40 ml/m  RA Area:     17.80 cm                                     RA Volume:   45.70 ml  28.06 ml/m   AORTIC VALVE  AV Area (Vmax):    1.06 cm  AV Area (Vmean):   1.17 cm  AV Area (VTI):     0.93 cm  AV Vmax:           352.00 cm/s  AV Vmean:          232.500 cm/s  AV VTI:            0.766 m  AV Peak Grad:      49.6 mmHg  AV Mean Grad:      26.5 mmHg  LVOT Vmax:         119.00 cm/s  LVOT Vmean:        86.500 cm/s  LVOT VTI:          0.227 m  LVOT/AV VTI ratio: 0.30    AORTA  Ao Root diam: 2.70 cm  Ao Asc diam:   3.10 cm   MV E velocity: 67.10 cm/s   TRICUSPID VALVE  MV A velocity: 104.00 cm/s  TR Peak grad:   43.3 mmHg  MV E/A ratio:  0.65         TR Vmax:  329.00 cm/s                              Estimated RAP:  3.00 mmHg                              RVSP:           46.3 mmHg                                SHUNTS                              Systemic VTI:  0.23 m                              Systemic Diam: 2.00 cm   Redell Shallow MD  Electronically signed by Redell Shallow MD  Signature Date/Time: 07/13/2024/4:56:42 PM        Final      Procedures  RIGHT HEART CATH AND CORONARY ANGIOGRAPHY   Conclusion      Prox LAD to Mid LAD lesion is 60% stenosed.   Ost Cx lesion is 10% stenosed.   Prox RCA lesion is 100% stenosed.   Ramus lesion is 90% stenosed.   1.  Chronically occluded mid right coronary artery with mild to moderate disease elsewhere. 2.  Fick cardiac output of 3.3 L/min and Fick cardiac index of 2.1 L/min/m with the following hemodynamics:            Right atrial pressure mean of 15 mmHg with CV waves to 20 mmHg            RV Pressure 44/7 with end-diastolic pressure of 15 mmHg            PA pressure 40/27 with a mean of 30 mmHg            Wedge pressure mean of 16 mmHg V waves to 16 mmHg            PVR 4.2 [place] units            PA pulsatility index of 1.8 3.  Capacious iliofemoral vessels bilaterally   Recommendation: Continue evaluation for aortic valve intervention; change torsemide  10 mg as needed to torsemide  10 mg daily                 Procedural Details  Technical Details The patient is a 78 year old female with a history of paradoxical low-flow low gradient aortic stenosis, severe TR, coronary artery disease with an occluded mid right coronary artery, ischemic cardiomyopathy, moderate RV dysfunction, hypertension, and hyperlipidemia who was seen in the outpatient setting due to her aortic valvular disease.  She is referred for coronary angiography and  right heart catheterization prior to aortic valve intervention.  After obtaining consent, the patient brought to the cardiac catheterization laboratory and prepped and draped sterile fashion.  Xylocaine  was used to anesthetize the right wrist and a 6 French Terumo glide sheath was placed.  5000 units heparin  and 5 mg verapamil  were administered through the sheath.  Ultrasound was used to gain access to the right internal jugular vein and a 5 French Terumo glide sheath was placed.  Right heart catheterization was performed with a 5  [place] balloontipped catheter.  Coronary angiography was performed with a 6 [place] TIG catheter.  A 5 French pigtail catheter was maneuvered to the abdominal aorta and abdominal aortography and bilateral iliofemoral angiography was performed.  After review of the angiographic and hemodynamic data, no further interventions were pursued.  A TR band was placed and manual pressure applied to the IJ site.  There were no acute complications. Estimated blood loss <50 mL.   During this procedure medications were administered to achieve and maintain moderate conscious sedation while the patient's heart rate, blood pressure, and oxygen saturation were continuously monitored and I was present face-to-face 100% of this time. Vernell Primrose RN and Suzen Salinas [facility] Cardiovascular Specialist are independent, trained observers who assisted in the monitoring of the patient's level of consciousness.   Medications (Filter: Administrations occurring from 1402 to 1511 on 07/29/24) midazolam  (VERSED ) injection (mg)  Total dose: 1 mg Date/Time Rate/Dose/Volume Action   07/29/24 1415 1 mg Given   fentaNYL  (SUBLIMAZE ) injection (mcg)  Total dose: 25 mcg Date/Time Rate/Dose/Volume Action   07/29/24 1415 25 mcg Given   Heparin  (Porcine) in NaCl 1000-0.9 UT/500ML-% SOLN (mL)  Total volume: 1,000 mL Date/Time Rate/Dose/Volume Action   07/29/24 1415 500 mL Given   1415 500 mL Given    lidocaine  (PF) (XYLOCAINE ) 1 % injection (mL)  Total volume: 6 mL Date/Time Rate/Dose/Volume Action   07/29/24 1424 2 mL Given   1426 2 mL Given   1445 2 mL Given   Radial Cocktail/Verapamil  only (mL)  Total volume: 10 mL Date/Time Rate/Dose/Volume Action   07/29/24 1428 10 mL Given   heparin  sodium (porcine) injection (Units)  Total dose: 5,000 Units Date/Time Rate/Dose/Volume Action   07/29/24 1428 5,000 Units Given    Sedation Time  Sedation Time Physician-1: 46 minutes 57 seconds Radiation/Fluoro  Fluoro time: 6.1 (min) DAP: 15.1 (Gycm2) Cumulative Air Kerma: 157.7 (mGy) Coronary Findings  Diagnostic Dominance: Right Left Anterior Descending  Prox LAD to Mid LAD lesion is 60% stenosed.    Ramus Intermedius  Ramus lesion is 90% stenosed.    Left Circumflex  Ost Cx lesion is 10% stenosed.    First Obtuse Marginal Branch    Right Coronary Artery  Collaterals  Dist RCA filled by collaterals from Ost RCA.    Prox RCA lesion is 100% stenosed.    Right Posterior Atrioventricular Artery  Collaterals  RPAV filled by collaterals from 1st Sept.      Intervention   No interventions have been documented.   Coronary Diagrams  Diagnostic Dominance: Right  Intervention   Implants   No implant documentation for this case.   Syngo Images   Show images for CARDIAC CATHETERIZATION Images on Long Term Storage   Show images for [place], BECKEY POLKOWSKI to Procedure Log  Procedure Log    Hemodynamics  Pressures Phases Resting  Right     RA Mean  mmHg 15    RA A-Wave  mmHg 19    RA V-Wave  mmHg 20  Pulmonary     PA  mmHg 39/24 (30)    PCW Mean  mmHg 16.0    PCW A-Wave  mmHg 15.0    PCW V-Wave  mmHg 16.0    PAPi   1.0    Saturations Phases Resting    PA  % 58    Arterial  % 89    Hemo Data  Flowsheet Row Most Recent Value  Fick Cardiac Output 3.37 L/min  Fick Cardiac Output  Index 2.09 (L/min)/BSA  RA A Wave 19 mmHg  RA V Wave 20 mmHg  RA  Mean 15 mmHg  RV Systolic Pressure 44 mmHg  RV Diastolic Pressure 7 mmHg  RV EDP 15 mmHg  PA Systolic Pressure 39 mmHg  PA Diastolic Pressure 24 mmHg  PA Mean 30 mmHg  PW A Wave 15 mmHg  PW V Wave 16 mmHg  PW Mean 16 mmHg  QP/QS 1  TPVR Index 14.36 HRUI   ADDENDUM REPORT: 08/11/2024 09:01   ADDENDUM: Limited non-coronary overread as follows:   Cardiovascular: Aortic atherosclerosis. Cardiomegaly. No pericardial effusion.   Limited Mediastinum/Nodes: No enlarged mediastinal, hilar, or axillary lymph nodes. Trachea and esophagus demonstrate no significant findings.   Limited Lungs/Pleura: Lungs are clear. No pleural effusion or pneumothorax.   Upper Abdomen: No acute abnormality.   Musculoskeletal: No chest wall abnormality. No acute osseous findings.   IMPRESSION: Cardiomegaly.  No acute extracardiac findings in the included chest.     Electronically Signed   By: Marolyn JONETTA Jaksch M.D.   On: 08/11/2024 09:01    Addended by Jaksch Marolyn JONETTA, MD on 08/11/2024  9:03 AM    Study Result  Narrative & Impression  CLINICAL DATA:  46F with severe aortic stenosis being evaluated for a TAVR procedure.   EXAM: Cardiac TAVR CT   TECHNIQUE: A non-contrast, gated CT scan was obtained with axial slices of 2.5 mm through the heart for aortic valve scoring. A 120 kV retrospective, gated, contrast cardiac scan was obtained. Gantry rotation speed was 230 msec and collimation was 0.63 mm. Nitroglycerin  was not given. A delayed scan was obtained to exclude left atrial appendage thrombus. The 3D dataset was reconstructed in systole with motion correction. The 3D data set was reconstructed in 5% intervals of the 0-95% of the R-R cycle. Systolic and diastolic phases were analyzed on a dedicated workstation using MPR, MIP, and VRT modes. The patient received 100 cc of contrast.   FINDINGS: Aortic Root:   Aortic valve: Tricuspid   Aortic valve calcium  score: 1813   Aortic  annulus:   Diameter: 25mm x 19mm   Perimeter: 70mm   Area: 334mm^2   Calcifications: Moderate calcification adjacent to left cusp   Coronary height: Min Left - 14mm; Min Right - 12mm   Sinotubular height: Left cusp - 22mm; Right cusp - 20mm; Noncoronary cusp - 21mm   LVOT (as measured 3 mm below the annulus):   Diameter: 27mm x 21mm   Area: 44mm^2   Calcifications: Moderate calcification inferior to left cusp   Aortic sinus width: Left cusp - 28mm; Right cusp - 26mm; Noncoronary cusp - 26mm   Sinotubular junction width: 25mm x 24mm   Optimum Fluoroscopic Angle for Delivery: LAO 18 CAU 1   Cardiac:   Right atrium: Mild enlargement   Right ventricle: Moderate enlargement   Pulmonary arteries: Normal size   Pulmonary veins: Normal configuration   Left atrium: Normal size   Left ventricle: Mild enlargement   Pericardium: Normal thickness   Coronary arteries: Calcium  score 2337 (98th percentile)   IMPRESSION: 1. Tricuspid aortic valve with severe calcifications (AV calcium  score 1813)   2. Aortic annulus measures 25mm x 19mm in diameter with perimeter 36mm^2 and area 374mm^2. Moderate annular calcifications adjacent to left cusp and extending into LVOT. Annular measurements are suitable for delivery of a 23mm Edwards Sapien 3 valve   3.  Sufficient coronary to annulus distance.   4.  Optimum Fluoroscopic Angle for Delivery:  LAO 18 CAU 1   5.  Coronary calcium  score 2337 (98th percentile)   Electronically Signed: By: Lonni Nanas M.D. On: 08/10/2024 17:20      Narrative & Impression  CLINICAL DATA:  Aortic valve replacement, preoperative evaluation   EXAM: CTA ABDOMEN AND PELVIS WITHOUT AND WITH CONTRAST   TECHNIQUE: Multidetector CT imaging of the abdomen and pelvis was performed using the standard protocol during bolus administration of intravenous contrast. Multiplanar reconstructed images and MIPs were obtained and reviewed to  evaluate the vascular anatomy.   RADIATION DOSE REDUCTION: This exam was performed according to the departmental dose-optimization program which includes automated exposure control, adjustment of the mA and/or kV according to patient size and/or use of iterative reconstruction technique.   CONTRAST:  OMNIPAQUE  IOHEXOL  350 MG/ML SOLN   COMPARISON:  None Available.   FINDINGS: VASCULAR   Aorta: Patent with mild atherosclerotic changes. Minimal diameter estimated at 9 mm.   Celiac: Patent.  Mild stenosis in the proximal segment.   SMA: Moderate atherosclerotic changes in the proximal segment, patent.   Renals: Mild atherosclerotic changes in the proximal right main renal artery. 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   IMA: Patent.   Inflow: The right common iliac artery is ectatic measuring 7 mm in minimal diameter. Right internal iliac artery is patent. The right external iliac artery is patent measuring 7 mm in minimal diameter.   Left common iliac artery is ectatic and patent. Minimal diameter is estimated at 7 mm. Left internal iliac artery is patent. The left external iliac artery is patent with minimal diameter estimated at 6 mm.   Proximal Outflow: Both common femoral arteries are patent with minimal diameter is estimated at 6 mm.   Veins: No obvious venous abnormality within the limitations of this arterial phase study.   Review of the MIP images confirms the above findings.   NON-VASCULAR   Lower chest: No acute abnormality.   Hepatobiliary: No focal liver abnormality is seen. No gallstones, gallbladder wall thickening, or biliary dilatation.   Pancreas: Unremarkable. No pancreatic ductal dilatation or surrounding inflammatory changes.   Spleen: Normal in size without focal abnormality.   Adrenals/Urinary Tract: The adrenal glands are within normal limits. Areas of cortical thinning are present in both  kidneys.   Stomach/Bowel: No dilated loops of bowel are seen. Small hiatal hernia. Scattered colonic diverticulosis.   Lymphatic: No significant lymphadenopathy.   Reproductive: Uterus and bilateral adnexa are unremarkable.   Other: Fat containing umbilical hernia.   Musculoskeletal: Internal fixation of the proximal right femur. Degenerative changes in the imaged osseous structures.   IMPRESSION: 1. Minimal access vessel diameters detailed above. 2. 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 3. 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     Electronically Signed   By: Maude Naegeli M.D.   On: 08/09/2024 05:49      Impression:  This 78 year old woman has stage D, severe, low-flow/low gradient aortic stenosis with NYHA class II symptoms of exertional fatigue and shortness of breath consistent with chronic diastolic congestive heart failure.  She has also been having episodes of dizziness.  I have personally reviewed her 2D echocardiogram, cardiac catheterization, and CTA studies.  Her echo shows a severely calcified aortic valve with a mean gradient of 26.5 mmHg and a valve area of 0.93 cm consistent with severe aortic stenosis.  Left ventricular ejection fraction is decreased to 40 to 45%.  Her valve looks  severely stenotic on echocardiogram.  There is moderate to severe tricuspid valve regurgitation but she has no peripheral edema.  Her right ventricle is mildly enlarged with normal systolic function.  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  This can be treated medically.  I agree that aortic valve replacement is indicated in this patient for relief of her symptoms and to prevent further left ventricular dysfunction.  Given her age, comorbidities including decreased mobility, and calcification of her aortic root I do not think she is a good candidate for open surgical aortic valve replacement.  I  think transcatheter aortic valve replacement be the best option for treating her.  Her gated cardiac CTA shows anatomy suitable for TAVR using a 23 mm Edwards SAPIEN 3 valve.  Her abdominal and pelvic CTA shows adequate pelvic vascular anatomy to allow transfemoral insertion.  She has right bundle branch block morphology on her EKG and will have a right IJ pacer placed for the procedure due to her higher risk of complete heart block.  The patient was counseled at length regarding treatment alternatives for management of severe symptomatic aortic stenosis. The risks and benefits of surgical intervention has been discussed in detail. Long-term prognosis with medical therapy was discussed. Alternative approaches such as conventional surgical aortic valve replacement, transcatheter aortic valve replacement, and palliative medical therapy were compared and contrasted at length. This discussion was placed in the context of the patient's own specific clinical presentation and past medical history. All of her questions have been addressed.   Following the decision to proceed with transcatheter aortic valve replacement, a discussion was held regarding what types of management strategies would be attempted intraoperatively in the event of life-threatening complications, including whether or not the patient would be considered a candidate for the use of cardiopulmonary bypass and/or conversion to open sternotomy for attempted surgical intervention.  Given her age, comorbidities, reduced mobility and calcified aortic root I think she would only be a candidate for emergent sternotomy under limited circumstances depending on the situation.  The patient has been advised of a variety of complications that might develop including but not limited to risks of death, stroke, paravalvular leak, aortic dissection or other major vascular complications, aortic annulus rupture, device embolization, cardiac rupture or perforation, mitral  regurgitation, acute myocardial infarction, arrhythmia, heart block or bradycardia requiring permanent pacemaker placement, congestive heart failure, respiratory failure, renal failure, pneumonia, infection, other late complications related to structural valve deterioration or migration, or other complications that might ultimately cause a temporary or permanent loss of functional independence or other long term morbidity. The patient provides full informed consent for the procedure as described and all questions were answered.     Plan:  She will be scheduled for transfemoral TAVR using a SAPIEN 3 valve on 09/06/2024.  I spent 45 minutes performing this consultation and > 50% of this time was spent face to face counseling and coordinating the care of this patient's severe symptomatic aortic stenosis.   [name], MD 08/24/2024

## 2024-08-29 ENCOUNTER — Other Ambulatory Visit: Payer: Self-pay

## 2024-08-29 DIAGNOSIS — I350 Nonrheumatic aortic (valve) stenosis: Secondary | ICD-10-CM

## 2024-09-02 ENCOUNTER — Ambulatory Visit (HOSPITAL_COMMUNITY)
Admission: RE | Admit: 2024-09-02 | Discharge: 2024-09-02 | Disposition: A | Source: Ambulatory Visit | Attending: Internal Medicine

## 2024-09-02 ENCOUNTER — Encounter (HOSPITAL_COMMUNITY)
Admission: RE | Admit: 2024-09-02 | Discharge: 2024-09-02 | Disposition: A | Source: Ambulatory Visit | Attending: Internal Medicine

## 2024-09-02 ENCOUNTER — Other Ambulatory Visit: Payer: Self-pay

## 2024-09-02 DIAGNOSIS — Z01818 Encounter for other preprocedural examination: Secondary | ICD-10-CM | POA: Diagnosis not present

## 2024-09-02 DIAGNOSIS — I350 Nonrheumatic aortic (valve) stenosis: Secondary | ICD-10-CM | POA: Diagnosis not present

## 2024-09-02 DIAGNOSIS — I517 Cardiomegaly: Secondary | ICD-10-CM | POA: Diagnosis not present

## 2024-09-02 LAB — URINALYSIS, ROUTINE W REFLEX MICROSCOPIC
Bilirubin Urine: NEGATIVE
Glucose, UA: >=500 mg/dL — AB
Hgb urine dipstick: NEGATIVE
Ketones, ur: NEGATIVE mg/dL
Nitrite: NEGATIVE
Protein, ur: NEGATIVE mg/dL
Specific Gravity, Urine: 1.025 (ref 1.005–1.030)
pH: 5 (ref 5.0–8.0)

## 2024-09-02 LAB — COMPREHENSIVE METABOLIC PANEL WITH GFR
ALT: 36 U/L (ref 0–44)
AST: 36 U/L (ref 15–41)
Albumin: 3.8 g/dL (ref 3.5–5.0)
Alkaline Phosphatase: 80 U/L (ref 38–126)
Anion gap: 13 (ref 5–15)
BUN: 17 mg/dL (ref 8–23)
CO2: 27 mmol/L (ref 22–32)
Calcium: 9.2 mg/dL (ref 8.9–10.3)
Chloride: 98 mmol/L (ref 98–111)
Creatinine, Ser: 0.81 mg/dL (ref 0.44–1.00)
GFR, Estimated: >60 mL/min (ref >60)
Glucose, Bld: 106 mg/dL — ABNORMAL HIGH (ref 70–99)
Potassium: 3.7 mmol/L (ref 3.5–5.1)
Sodium: 138 mmol/L (ref 135–145)
Total Bilirubin: 0.9 mg/dL (ref 0.0–1.2)
Total Protein: 7.5 g/dL (ref 6.5–8.1)

## 2024-09-02 LAB — CBC
HCT: 46.4 % — ABNORMAL HIGH (ref 36.0–46.0)
Hemoglobin: 15.4 g/dL — ABNORMAL HIGH (ref 12.0–15.0)
MCH: 31.8 pg (ref 26.0–34.0)
MCHC: 33.2 g/dL (ref 30.0–36.0)
MCV: 95.9 fL (ref 80.0–100.0)
Platelets: 222 K/uL (ref 150–400)
RBC: 4.84 MIL/uL (ref 3.87–5.11)
RDW: 14.2 % (ref 11.5–15.5)
WBC: 8.6 K/uL (ref 4.0–10.5)
nRBC: 0 % (ref 0.0–0.2)

## 2024-09-02 LAB — PROTIME-INR
INR: 1.1 (ref 0.8–1.2)
Prothrombin Time: 14.6 s (ref 11.4–15.2)

## 2024-09-02 LAB — TYPE AND SCREEN
ABO/RH(D): B POS
Antibody Screen: NEGATIVE

## 2024-09-02 LAB — SURGICAL PCR SCREEN
MRSA, PCR: NEGATIVE
Staphylococcus aureus: NEGATIVE

## 2024-09-02 NOTE — Progress Notes (Signed)
 All consents signed by patient at PAT lab appointment. Pt was sent home with printed copy of surgical instructions and CHG soap/CHG soap instructions. All instructions reviewed with patient and questions answered.  Patients chart send to anesthesia for review. Pt denies any respiratory illness/infection in the last two months.

## 2024-09-05 MED ORDER — DEXMEDETOMIDINE HCL IN NACL 400 MCG/100ML IV SOLN
0.1000 ug/kg/h | INTRAVENOUS | Status: AC
  Administered 2024-09-06: 34 ug via INTRAVENOUS
  Administered 2024-09-06: 1 ug/kg/h via INTRAVENOUS
  Filled 2024-09-05: qty 100

## 2024-09-05 MED ORDER — MAGNESIUM SULFATE 50 % IJ SOLN
40.0000 meq | INTRAMUSCULAR | Status: DC
  Filled 2024-09-05: qty 9.85

## 2024-09-05 MED ORDER — POTASSIUM CHLORIDE 2 MEQ/ML IV SOLN
80.0000 meq | INTRAVENOUS | Status: DC
  Filled 2024-09-05: qty 40

## 2024-09-05 MED ORDER — HEPARIN 30,000 UNITS/1000 ML (OHS) CELLSAVER SOLUTION
Status: DC
  Filled 2024-09-05: qty 1000

## 2024-09-05 MED ORDER — CEFAZOLIN SODIUM-DEXTROSE 2-4 GM/100ML-% IV SOLN
2.0000 g | INTRAVENOUS | Status: AC
  Administered 2024-09-06: 500 mg via INTRAVENOUS
  Administered 2024-09-06: 1500 mg via INTRAVENOUS
  Filled 2024-09-05 (×2): qty 100

## 2024-09-05 MED ORDER — NOREPINEPHRINE 4 MG/250ML-% IV SOLN
0.0000 ug/min | INTRAVENOUS | Status: DC
  Filled 2024-09-05: qty 250

## 2024-09-05 MED ORDER — CEFAZOLIN SODIUM-DEXTROSE 3-4 GM/150ML-% IV SOLN
3.0000 g | INTRAVENOUS | Status: DC
  Filled 2024-09-05 (×2): qty 150

## 2024-09-05 NOTE — H&P (Signed)
 [address], [place] [address]             [phone]      CARDIOTHORACIC SURGERY ADMISSION HISTORY AND PHYSICAL   PCP is Arloa Elsie SAUNDERS, MD Referring Provider is Lurena Red, MD Primary Cardiologist is Shelda Bruckner, MD   Reason for admission: Severe aortic stenosis   HPI:   The patient is a 78 year old woman with a history of hypertension, hyperlipidemia, diabetes, coronary artery disease status post STEMI in April 2023, severe tricuspid regurgitation by echocardiogram in 2025, right bundle branch block, and severe aortic stenosis who was referred for consideration of TAVR.  She had a late presentation IMI and April 2023 with a reduced ejection fraction of 30 to 35%.  She was supported with inotropes and developed a transient second-degree Mobitz type II block which resolved.  She was started on GDMT with improvement in her ejection fraction to 40 to 45%.  She has been followed in the advanced heart failure clinic with NYHA class II symptoms.  Her most recent echo on 07/13/2024 showed a calcified aortic valve with a mean gradient of 26.5 mmHg and a peak of 50 mmHg.  Aortic valve area by VTI was 0.93 cm.  Stroke-volume index was 44 with a dimensionless index of 0.3.  There was mild MR and moderate to severe TR.  Left ventricular ejection fraction was 40 to 45%.  Over the last few months she has had increased fatigue and shortness of breath with activity around her house.  She has had no chest pain or pressure.  She has had dizziness but no syncope.  She denies orthopnea and peripheral edema.           Past Medical History:  Diagnosis Date   CHF (congestive heart failure) (HCC)     Coronary artery disease     GERD (gastroesophageal reflux disease)     Hyperlipidemia     Obesity     Pre-diabetes                 Past Surgical History:  Procedure Laterality Date   CARDIAC CATHETERIZATION       CARPAL TUNNEL RELEASE Bilateral     FEMUR  FRACTURE SURGERY       REPLACEMENT TOTAL KNEE BILATERAL       RIGHT HEART CATH AND CORONARY ANGIOGRAPHY N/A 07/29/2024    Procedure: RIGHT HEART CATH AND CORONARY ANGIOGRAPHY;  Surgeon: Thukkani, Arun K, MD;  Location: [facility];  Service: Cardiovascular;  Laterality: N/A;   RIGHT/LEFT HEART CATH AND CORONARY ANGIOGRAPHY N/A 10/22/2021    Procedure: RIGHT/LEFT HEART CATH AND CORONARY ANGIOGRAPHY;  Surgeon: Claudene Victory ORN, MD;  Location: [facility];  Service: Cardiovascular;  Laterality: N/A;   RIGHT/LEFT HEART CATH AND CORONARY ANGIOGRAPHY N/A 02/28/2022    Procedure: RIGHT/LEFT HEART CATH AND CORONARY ANGIOGRAPHY;  Surgeon: Cherrie Toribio SAUNDERS, MD;  Location: [facility];  Service: Cardiovascular;  Laterality: N/A;   RIGHT/LEFT HEART CATH AND CORONARY ANGIOGRAPHY N/A 08/11/2023    Procedure: RIGHT/LEFT HEART CATH AND CORONARY ANGIOGRAPHY;  Surgeon: Cherrie Toribio SAUNDERS, MD;  Location: [facility];  Service: Cardiovascular;  Laterality: N/A;   SHOULDER SURGERY Right      rotator cuff               Family History  Problem Relation Age of Onset   CAD Neg Hx  Social History         Socioeconomic History   Marital status: Widowed      Spouse name: Not on file   Number of children: Not on file   Years of education: 12   Highest education level: High school graduate  Occupational History   Occupation: retired  Tobacco Use   Smoking status: Never   Smokeless tobacco: Never  Vaping Use   Vaping status: Not on file  Substance and Sexual Activity   Alcohol  use: No   Drug use: No   Sexual activity: Not Currently  Other Topics Concern   Not on file  Social History Narrative   Not on file    Social Drivers of Health        Financial Resource Strain: Not on file  Food Insecurity: No Food Insecurity (07/24/2022)    Hunger Vital Sign     Worried About Running Out of Food in the Last Year: Never true     Ran Out of Food in the Last Year:  Never true  Transportation Needs: No Transportation Needs (07/24/2022)    PRAPARE - Therapist, art (Medical): No     Lack of Transportation (Non-Medical): No  Physical Activity: Not on file  Stress: Not on file  Social Connections: Not on file  Intimate Partner Violence: Not on file             Prior to Admission medications   Medication Sig Start Date End Date Taking? Authorizing Provider  acetaminophen  (TYLENOL ) 500 MG tablet Take 500 mg by mouth every 6 (six) hours as needed for mild pain or headache.       [provider]  aspirin  81 MG EC tablet Take 81 mg by mouth every evening.       [provider]  atorvastatin  (LIPITOR ) 80 MG tablet Take 1 tablet (80 mg total) by mouth daily. 10/24/21     Bryn Bernardino NOVAK, MD  B Complex-C (B-COMPLEX WITH VITAMIN C) tablet Take 1 tablet by mouth daily. 11/21/15     [provider]  BORON PO Take 5 mg by mouth daily.       [provider]  Calcium -Magnesium-Zinc (CAL-MAG-ZINC PO) Take 1 tablet by mouth 3 (three) times daily.       [provider]  Carboxymethylcellulose Sodium (DRY EYE RELIEF OP) Place 1 drop into both eyes 4 (four) times daily as needed (dryness).       [provider]  cetirizine (ZYRTEC) 10 MG tablet Take 10 mg by mouth daily.       [provider]  Cholecalciferol (VITAMIN D3) 10 MCG (400 UNIT) tablet Take 400 Units by mouth 2 (two) times daily.       [provider]  Coenzyme Q10 (CO Q-10) 200 MG CAPS Take 200 mg by mouth daily.       [provider]  DHEA 25 MG tablet Take 25 mg by mouth daily.       [provider]  empagliflozin  (JARDIANCE ) 10 MG TABS tablet Take 1 tablet (10 mg total) by mouth daily. PLEASE SCHEDULE APPOINTMENT FOR MORE REFILLS 06/16/24     Colletta Manuelita Garre, PA-C  Glucosamine-Chondroitin 750-600 MG TABS Take 1 tablet by mouth 2 (two) times daily.       [provider]  meclizine   (ANTIVERT ) 25 MG tablet Take 25 mg by mouth daily. 09/12/21     [provider]  Methylsulfonylmethane (  MSM) 1000 MG CAPS Take 1,000 mg by mouth daily.       [provider]  Misc Natural Products (BEET ROOT) 500 MG CAPS Take 500 mg by mouth 3 (three) times daily.       [provider]  Omega-3 Fatty Acids (FISH OIL PO) Take 1 capsule by mouth 2 (two) times daily.       [provider]  OVER THE COUNTER MEDICATION Take 1 capsule by mouth daily. Sea Teacher, adult education, Historical, MD  OVER THE COUNTER MEDICATION Place 1 Application into both nostrils daily as needed (congestion). Vicks Nasal Sticks       [provider]  potassium chloride  (KLOR-CON ) 10 MEQ tablet Take 1 tablet (10 mEq total) by mouth 3 (three) times a week. 10/30/23     Lee, [place], NP  Pramoxine-Dimethicone (GOLD BOND INTENSIVE HEALING EX) Apply 1 application  topically daily as needed (eczema).       [provider]  torsemide  (DEMADEX ) 20 MG tablet Take 0.5 tablets (10 mg total) by mouth daily. 07/29/24     Thukkani, Arun K, MD  vitamin E 180 MG (400 UNITS) capsule Take 400 Units by mouth daily.       [provider]            Current Outpatient Medications  Medication Sig Dispense Refill   acetaminophen  (TYLENOL ) 500 MG tablet Take 500 mg by mouth every 6 (six) hours as needed for mild pain or headache.       aspirin  81 MG EC tablet Take 81 mg by mouth every evening.       atorvastatin  (LIPITOR ) 80 MG tablet Take 1 tablet (80 mg total) by mouth daily. 30 tablet 0   B Complex-C (B-COMPLEX WITH VITAMIN C) tablet Take 1 tablet by mouth daily.       BORON PO Take 5 mg by mouth daily.       Calcium -Magnesium-Zinc (CAL-MAG-ZINC PO) Take 1 tablet by mouth 3 (three) times daily.       Carboxymethylcellulose Sodium (DRY EYE RELIEF OP) Place 1 drop into both eyes 4 (four) times daily as needed (dryness).       cetirizine (ZYRTEC) 10 MG tablet Take 10 mg by mouth daily.        Cholecalciferol (VITAMIN D3) 10 MCG (400 UNIT) tablet Take 400 Units by mouth 2 (two) times daily.       Coenzyme Q10 (CO Q-10) 200 MG CAPS Take 200 mg by mouth daily.       DHEA 25 MG tablet Take 25 mg by mouth daily.       empagliflozin  (JARDIANCE ) 10 MG TABS tablet Take 1 tablet (10 mg total) by mouth daily. PLEASE SCHEDULE APPOINTMENT FOR MORE REFILLS 90 tablet 0   Glucosamine-Chondroitin 750-600 MG TABS Take 1 tablet by mouth 2 (two) times daily.       meclizine  (ANTIVERT ) 25 MG tablet Take 25 mg by mouth daily.       Methylsulfonylmethane (MSM) 1000 MG CAPS Take 1,000 mg by mouth daily.       Misc Natural Products (BEET ROOT) 500 MG CAPS Take 500 mg by mouth 3 (three) times daily.       Omega-3 Fatty Acids (FISH OIL PO) Take 1 capsule by mouth 2 (two) times daily.       OVER THE COUNTER MEDICATION Take 1 capsule by mouth daily. Sea Kelp       OVER THE  COUNTER MEDICATION Place 1 Application into both nostrils daily as needed (congestion). Vicks Nasal Sticks       potassium chloride  (KLOR-CON ) 10 MEQ tablet Take 1 tablet (10 mEq total) by mouth 3 (three) times a week. 15 tablet 3   Pramoxine-Dimethicone (GOLD BOND INTENSIVE HEALING EX) Apply 1 application  topically daily as needed (eczema).       torsemide  (DEMADEX ) 20 MG tablet Take 0.5 tablets (10 mg total) by mouth daily. 45 tablet 6   vitamin E 180 MG (400 UNITS) capsule Take 400 Units by mouth daily.          No current facility-administered medications for this visit.        Allergies       Allergies  Allergen Reactions   Timolol Shortness Of Breath   Codeine Other (See Comments)      sick   Morphine And Codeine Nausea And Vomiting   Other Other (See Comments)      vicryl sutures - rash/redness, delay in healing            Review of Systems:               General:                      Normal appetite, + decreased energy, no weight gain, no weight loss, no fever             Cardiac:                       no chest  pain with exertion, no chest pain at rest, +SOB with mild exertion, no resting SOB, no PND, no orthopnea, no palpitations, no arrhythmia, no atrial fibrillation, no LE edema, + dizzy spells, no syncope             Respiratory:                 + exertional shortness of breath, no home oxygen, no productive cough, no dry cough, no bronchitis, no wheezing, no hemoptysis, no asthma, no pain with inspiration or cough, no sleep apnea, no CPAP at night             GI:                               no difficulty swallowing, no reflux, no frequent heartburn, no hiatal hernia, no abdominal pain, no constipation, no diarrhea, no hematochezia, no hematemesis, no melena             GU:                              no dysuria,  no frequency, no urinary tract infection, no hematuria, no kidney stones, no kidney disease             Vascular:                     no pain suggestive of claudication, no pain in feet, no leg cramps, no varicose veins, no DVT, no non-healing foot ulcer             Neuro:                         no stroke, no TIA's, no seizures, no headaches,  no temporary blindness one eye,  no slurred speech, no peripheral neuropathy, no chronic pain, + instability of gait, no memory/cognitive dysfunction             Musculoskeletal:         + arthritis, no joint swelling, no myalgias, no difficulty walking, + reduced mobility              Skin:                            no rash, no itching, no skin infections, no pressure sores or ulcerations             Psych:                         no anxiety, no depression, no nervousness, no unusual recent stress             Eyes:                           no blurry vision, no floaters, no recent vision changes, + wears glasses             ENT:                            no hearing loss, no loose or painful teeth, no dentures, last saw dentist 10/6 for extraction             Hematologic:               no easy bruising, no abnormal bleeding, no clotting disorder, no  frequent epistaxis             Endocrine:                   + borderline diabetes, does not check CBG's at home                            Physical Exam:               BP 124/78   Pulse (!) 104   Resp 18   Ht 4' 9 (1.448 m)   Wt 150 lb (68 kg)   SpO2 90%   BMI 32.46 kg/m              General:                      Elderly,  well-appearing             HEENT:                       Unremarkable, NCAT, PERLA, EOMI             Neck:                           no JVD, no bruits, no adenopathy              Chest:                          clear to auscultation, symmetrical breath sounds, no wheezes, no rhonchi  CV:                              RRR, 3/6 systolic murmur RSB, no diastolic murmur             Abdomen:                    soft, non-tender, no masses              Extremities:                 warm, well-perfused, pedal pulses palpable, no lower extremity edema             Rectal/GU                   Deferred             Neuro:                         Grossly non-focal and symmetrical throughout             Skin:                            Clean and dry, no rashes, no breakdown   Diagnostic Tests:       ECHOCARDIOGRAM REPORT       Patient Name:   Amy [name] Date of Exam: 07/13/2024  Medical Rec #:  [id number]      Height:       57.5 in  Accession #:    [id number]     Weight:       156.0 lb  Date of Birth:  10/04/1946      BSA:          1.629 m  Patient Age:    78 years       BP:           120/62 mmHg  Patient Gender: F              HR:           98 bpm.  Exam Location:  [address]   Procedure: 2D Echo and Intracardiac Opacification Agent (Both Spectral and  Color            Flow Doppler were utilized during procedure).   Indications:    I35.0 Nonrheumatic aortic (valve) stenosis    History:        Patient has prior history of Echocardiogram examinations,  most                 recent 07/31/2023. CHF, CAD and Previous Myocardial  Infarction,                  Arrythmias:LBBB; Risk Factors:Hypertension and  Dyslipidemia.                 Prediabetes. Aortic atherosclerosis. Obesity.    Sonographer:    Jon Hacker RCS  Referring Phys: [id number] ARUN K THUKKANI   IMPRESSIONS     1. Akinesis of the inferolateral and inferoseptal walls; overall EF  40-45. Calcified aortic valve with severe AS (mean gradient 26.5 mmHg; AVA  0.93 cm2); trace AI.   2. Left ventricular ejection fraction, by estimation, is 40 to 45%. The  left ventricle has mildly decreased function. The left ventricle  demonstrates regional wall motion  abnormalities (see scoring  diagram/findings for description). Left ventricular  diastolic parameters are consistent with Grade I diastolic dysfunction  (impaired relaxation). There is the interventricular septum is flattened  in systole and diastole, consistent with right ventricular pressure and  volume overload.   3. Right ventricular systolic function is normal. The right ventricular  size is mildly enlarged.   4. The mitral valve is normal in structure. Mild mitral valve  regurgitation. No evidence of mitral stenosis. Moderate mitral annular  calcification.   5. Tricuspid valve regurgitation is moderate to severe.   6. The aortic valve is calcified. Aortic valve regurgitation is trivial.  Severe aortic valve stenosis.   7. The inferior vena cava is normal in size with greater than 50%  respiratory variability, suggesting right atrial pressure of 3 mmHg.   FINDINGS   Left Ventricle: Left ventricular ejection fraction, by estimation, is 40  to 45%. The left ventricle has mildly decreased function. The left  ventricle demonstrates regional wall motion abnormalities. Definity   contrast agent was given IV to delineate the  left ventricular endocardial borders. The left ventricular internal cavity  size was normal in size. There is no left ventricular hypertrophy. The  interventricular septum is flattened in systole and  diastole, consistent  with right ventricular pressure and   volume overload. Left ventricular diastolic parameters are consistent  with Grade I diastolic dysfunction (impaired relaxation).   Right Ventricle: The right ventricular size is mildly enlarged. Right  ventricular systolic function is normal.   Left Atrium: Left atrial size was normal in size.   Right Atrium: Right atrial size was normal in size.   Pericardium: There is no evidence of pericardial effusion.   Mitral Valve: The mitral valve is normal in structure. Moderate mitral  annular calcification. Mild mitral valve regurgitation. No evidence of  mitral valve stenosis.   Tricuspid Valve: The tricuspid valve is normal in structure. Tricuspid  valve regurgitation is moderate to severe. No evidence of tricuspid  stenosis.   Aortic Valve: The aortic valve is calcified. Aortic valve regurgitation is  trivial. Severe aortic stenosis is present. Aortic valve mean gradient  measures 26.5 mmHg. Aortic valve peak gradient measures 49.6 mmHg. Aortic  valve area, by VTI measures 0.93  cm.   Pulmonic Valve: The pulmonic valve was normal in structure. Pulmonic valve  regurgitation is trivial. No evidence of pulmonic stenosis.   Aorta: The aortic root is normal in size and structure.   Venous: The inferior vena cava is normal in size with greater than 50%  respiratory variability, suggesting right atrial pressure of 3 mmHg.   IAS/Shunts: No atrial level shunt detected by color flow Doppler.   Additional Comments: Akinesis of the inferolateral and inferoseptal walls;  overall EF 40-45. Calcified aortic valve with severe AS (mean gradient  26.5 mmHg; AVA 0.93 cm2); trace AI.     LEFT VENTRICLE  PLAX 2D  LVIDd:         5.34 cm   Diastology  LVIDs:         4.58 cm   LV e' medial:    5.33 cm/s  LV PW:         0.95 cm   LV E/e' medial:  12.6  LV IVS:        0.99 cm   LV e' lateral:   7.18 cm/s  LVOT diam:     2.00 cm   LV  E/e' lateral: 9.3  LV SV:  71  LV SV Index:   44  LVOT Area:     3.14 cm     RIGHT VENTRICLE  RV Basal diam:  4.52 cm  RV S prime:     10.80 cm/s  TAPSE (M-mode): 2.2 cm  RVSP:           46.3 mmHg   LEFT ATRIUM           Index        RIGHT ATRIUM           Index  LA diam:      3.30 cm 2.03 cm/m   RA Pressure: 3.00 mmHg  LA Vol (A4C): 20.2 ml 12.40 ml/m  RA Area:     17.80 cm                                     RA Volume:   45.70 ml  28.06 ml/m   AORTIC VALVE  AV Area (Vmax):    1.06 cm  AV Area (Vmean):   1.17 cm  AV Area (VTI):     0.93 cm  AV Vmax:           352.00 cm/s  AV Vmean:          232.500 cm/s  AV VTI:            0.766 m  AV Peak Grad:      49.6 mmHg  AV Mean Grad:      26.5 mmHg  LVOT Vmax:         119.00 cm/s  LVOT Vmean:        86.500 cm/s  LVOT VTI:          0.227 m  LVOT/AV VTI ratio: 0.30    AORTA  Ao Root diam: 2.70 cm  Ao Asc diam:  3.10 cm   MV E velocity: 67.10 cm/s   TRICUSPID VALVE  MV A velocity: 104.00 cm/s  TR Peak grad:   43.3 mmHg  MV E/A ratio:  0.65         TR Vmax:        329.00 cm/s                              Estimated RAP:  3.00 mmHg                              RVSP:           46.3 mmHg                                SHUNTS                              Systemic VTI:  0.23 m                              Systemic Diam: 2.00 cm   Redell Shallow MD  Electronically signed by Redell Shallow MD  Signature Date/Time: 07/13/2024/4:56:42 PM        Final        Procedures   RIGHT HEART CATH AND CORONARY ANGIOGRAPHY  Conclusion       Prox LAD to Mid LAD lesion is 60% stenosed.   Ost Cx lesion is 10% stenosed.   Prox RCA lesion is 100% stenosed.   Ramus lesion is 90% stenosed.   1.  Chronically occluded mid right coronary artery with mild to moderate disease elsewhere. 2.  Fick cardiac output of 3.3 L/min and Fick cardiac index of 2.1 L/min/m with the following hemodynamics:            Right atrial pressure  mean of 15 mmHg with CV waves to 20 mmHg            RV Pressure 44/7 with end-diastolic pressure of 15 mmHg            PA pressure 40/27 with a mean of 30 mmHg            Wedge pressure mean of 16 mmHg V waves to 16 mmHg            PVR 4.2 Woods units            PA pulsatility index of 1.8 3.  Capacious iliofemoral vessels bilaterally   Recommendation: Continue evaluation for aortic valve intervention; change torsemide  10 mg as needed to torsemide  10 mg daily                 Procedural Details   Technical Details The patient is a 78 year old female with a history of paradoxical low-flow low gradient aortic stenosis, severe TR, coronary artery disease with an occluded mid right coronary artery, ischemic cardiomyopathy, moderate RV dysfunction, hypertension, and hyperlipidemia who was seen in the outpatient setting due to her aortic valvular disease.  She is referred for coronary angiography and right heart catheterization prior to aortic valve intervention.  After obtaining consent, the patient brought to the cardiac catheterization laboratory and prepped and draped sterile fashion.  Xylocaine  was used to anesthetize the right wrist and a 6 French Terumo glide sheath was placed.  5000 units heparin  and 5 mg verapamil  were administered through the sheath.  Ultrasound was used to gain access to the right internal jugular vein and a 5 French Terumo glide sheath was placed.  Right heart catheterization was performed with a 5 [place] balloontipped catheter.  Coronary angiography was performed with a 6 [place] TIG catheter.  A 5 French pigtail catheter was maneuvered to the abdominal aorta and abdominal aortography and bilateral iliofemoral angiography was performed.  After review of the angiographic and hemodynamic data, no further interventions were pursued.  A TR band was placed and manual pressure applied to the IJ site.  There were no acute complications. Estimated blood loss <50 mL.   During this  procedure medications were administered to achieve and maintain moderate conscious sedation while the patient's heart rate, blood pressure, and oxygen saturation were continuously monitored and I was present face-to-face 100% of this time. Vernell Primrose RN and Suzen Salinas [facility] Cardiovascular Specialist are independent, trained observers who assisted in the monitoring of the patient's level of consciousness.    Medications (Filter: Administrations occurring from 1402 to 1511 on 07/29/24) midazolam  (VERSED ) injection (mg)  Total dose: 1 mg Date/Time Rate/Dose/Volume Action    07/29/24 1415 1 mg Given    fentaNYL  (SUBLIMAZE ) injection (mcg)  Total dose: 25 mcg Date/Time Rate/Dose/Volume Action    07/29/24 1415 25 mcg Given    Heparin  (Porcine) in NaCl 1000-0.9 UT/500ML-% SOLN (mL)  Total volume: 1,000 mL Date/Time Rate/Dose/Volume Action    07/29/24  1415 500 mL Given    1415 500 mL Given    lidocaine  (PF) (XYLOCAINE ) 1 % injection (mL)  Total volume: 6 mL Date/Time Rate/Dose/Volume Action    07/29/24 1424 2 mL Given    1426 2 mL Given    1445 2 mL Given    Radial Cocktail/Verapamil  only (mL)  Total volume: 10 mL Date/Time Rate/Dose/Volume Action    07/29/24 1428 10 mL Given    heparin  sodium (porcine) injection (Units)  Total dose: 5,000 Units Date/Time Rate/Dose/Volume Action    07/29/24 1428 5,000 Units Given      Sedation Time   Sedation Time Physician-1: 46 minutes 57 seconds Radiation/Fluoro   Fluoro time: 6.1 (min) DAP: 15.1 (Gycm2) Cumulative Air Kerma: 157.7 (mGy) Coronary Findings   Diagnostic Dominance: Right Left Anterior Descending  Prox LAD to Mid LAD lesion is 60% stenosed.    Ramus Intermedius  Ramus lesion is 90% stenosed.    Left Circumflex  Ost Cx lesion is 10% stenosed.    First Obtuse Marginal Branch    Right Coronary Artery  Collaterals  Dist RCA filled by collaterals from Ost RCA.     Prox RCA lesion is 100% stenosed.    Right  Posterior Atrioventricular Artery  Collaterals  RPAV filled by collaterals from 1st Sept.       Intervention    No interventions have been documented.    Coronary Diagrams   Diagnostic Dominance: Right  Intervention    Implants    No implant documentation for this case.    Syngo Images    Show images for CARDIAC CATHETERIZATION Images on Long Term Storage    Show images for Roxana, Lai to Procedure Log   Procedure Log    Hemodynamics   Pressures Phases Resting  Right      RA Mean  mmHg 15    RA A-Wave  mmHg 19    RA V-Wave  mmHg 20  Pulmonary      PA  mmHg 39/24 (30)    PCW Mean  mmHg 16.0    PCW A-Wave  mmHg 15.0    PCW V-Wave  mmHg 16.0    PAPi   1.0    Saturations Phases Resting    PA  % 58    Arterial  % 89    Hemo Data   Flowsheet Row Most Recent Value  Fick Cardiac Output 3.37 L/min  Fick Cardiac Output Index 2.09 (L/min)/BSA  RA A Wave 19 mmHg  RA V Wave 20 mmHg  RA Mean 15 mmHg  RV Systolic Pressure 44 mmHg  RV Diastolic Pressure 7 mmHg  RV EDP 15 mmHg  PA Systolic Pressure 39 mmHg  PA Diastolic Pressure 24 mmHg  PA Mean 30 mmHg  PW A Wave 15 mmHg  PW V Wave 16 mmHg  PW Mean 16 mmHg  QP/QS 1  TPVR Index 14.36 HRUI    ADDENDUM REPORT: 08/11/2024 09:01   ADDENDUM: Limited non-coronary overread as follows:   Cardiovascular: Aortic atherosclerosis. Cardiomegaly. No pericardial effusion.   Limited Mediastinum/Nodes: No enlarged mediastinal, hilar, or axillary lymph nodes. Trachea and esophagus demonstrate no significant findings.   Limited Lungs/Pleura: Lungs are clear. No pleural effusion or pneumothorax.   Upper Abdomen: No acute abnormality.   Musculoskeletal: No chest wall abnormality. No acute osseous findings.   IMPRESSION: Cardiomegaly.  No acute extracardiac findings in the included chest.     Electronically Signed   By: Marolyn JONETTA Marlyce CHRISTELLA.D.  On: 08/11/2024 09:01    Addended by Marlyce Marolyn BIRCH, MD on  08/11/2024  9:03 AM    Study Result   Narrative & Impression  CLINICAL DATA:  11F with severe aortic stenosis being evaluated for a TAVR procedure.   EXAM: Cardiac TAVR CT   TECHNIQUE: A non-contrast, gated CT scan was obtained with axial slices of 2.5 mm through the heart for aortic valve scoring. A 120 kV retrospective, gated, contrast cardiac scan was obtained. Gantry rotation speed was 230 msec and collimation was 0.63 mm. Nitroglycerin  was not given. A delayed scan was obtained to exclude left atrial appendage thrombus. The 3D dataset was reconstructed in systole with motion correction. The 3D data set was reconstructed in 5% intervals of the 0-95% of the R-R cycle. Systolic and diastolic phases were analyzed on a dedicated workstation using MPR, MIP, and VRT modes. The patient received 100 cc of contrast.   FINDINGS: Aortic Root:   Aortic valve: Tricuspid   Aortic valve calcium  score: 1813   Aortic annulus:   Diameter: 25mm x 19mm   Perimeter: 70mm   Area: 364mm^2   Calcifications: Moderate calcification adjacent to left cusp   Coronary height: Min Left - 14mm; Min Right - 12mm   Sinotubular height: Left cusp - 22mm; Right cusp - 20mm; Noncoronary cusp - 21mm   LVOT (as measured 3 mm below the annulus):   Diameter: 27mm x 21mm   Area: 432mm^2   Calcifications: Moderate calcification inferior to left cusp   Aortic sinus width: Left cusp - 28mm; Right cusp - 26mm; Noncoronary cusp - 26mm   Sinotubular junction width: 25mm x 24mm   Optimum Fluoroscopic Angle for Delivery: LAO 18 CAU 1   Cardiac:   Right atrium: Mild enlargement   Right ventricle: Moderate enlargement   Pulmonary arteries: Normal size   Pulmonary veins: Normal configuration   Left atrium: Normal size   Left ventricle: Mild enlargement   Pericardium: Normal thickness   Coronary arteries: Calcium  score 2337 (98th percentile)   IMPRESSION: 1. Tricuspid aortic valve with  severe calcifications (AV calcium  score 1813)   2. Aortic annulus measures 25mm x 19mm in diameter with perimeter 15mm^2 and area 381mm^2. Moderate annular calcifications adjacent to left cusp and extending into LVOT. Annular measurements are suitable for delivery of a 23mm Edwards Sapien 3 valve   3.  Sufficient coronary to annulus distance.   4.  Optimum Fluoroscopic Angle for Delivery:  LAO 18 CAU 1   5.  Coronary calcium  score 2337 (98th percentile)   Electronically Signed: By: Lonni Nanas M.D. On: 08/10/2024 17:20        Narrative & Impression  CLINICAL DATA:  Aortic valve replacement, preoperative evaluation   EXAM: CTA ABDOMEN AND PELVIS WITHOUT AND WITH CONTRAST   TECHNIQUE: Multidetector CT imaging of the abdomen and pelvis was performed using the standard protocol during bolus administration of intravenous contrast. Multiplanar reconstructed images and MIPs were obtained and reviewed to evaluate the vascular anatomy.   RADIATION DOSE REDUCTION: This exam was performed according to the departmental dose-optimization program which includes automated exposure control, adjustment of the mA and/or kV according to patient size and/or use of iterative reconstruction technique.   CONTRAST:  OMNIPAQUE  IOHEXOL  350 MG/ML SOLN   COMPARISON:  None Available.   FINDINGS: VASCULAR   Aorta: Patent with mild atherosclerotic changes. Minimal diameter estimated at 9 mm.   Celiac: Patent.  Mild stenosis in the proximal segment.   SMA: Moderate  atherosclerotic changes in the proximal segment, patent.   Renals: Mild atherosclerotic changes in the proximal right main renal artery. 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   IMA: Patent.   Inflow: The right common iliac artery is ectatic measuring 7 mm in minimal diameter. Right internal iliac artery is patent. The right external iliac artery is patent measuring 7 mm in  minimal diameter.   Left common iliac artery is ectatic and patent. Minimal diameter is estimated at 7 mm. Left internal iliac artery is patent. The left external iliac artery is patent with minimal diameter estimated at 6 mm.   Proximal Outflow: Both common femoral arteries are patent with minimal diameter is estimated at 6 mm.   Veins: No obvious venous abnormality within the limitations of this arterial phase study.   Review of the MIP images confirms the above findings.   NON-VASCULAR   Lower chest: No acute abnormality.   Hepatobiliary: No focal liver abnormality is seen. No gallstones, gallbladder wall thickening, or biliary dilatation.   Pancreas: Unremarkable. No pancreatic ductal dilatation or surrounding inflammatory changes.   Spleen: Normal in size without focal abnormality.   Adrenals/Urinary Tract: The adrenal glands are within normal limits. Areas of cortical thinning are present in both kidneys.   Stomach/Bowel: No dilated loops of bowel are seen. Small hiatal hernia. Scattered colonic diverticulosis.   Lymphatic: No significant lymphadenopathy.   Reproductive: Uterus and bilateral adnexa are unremarkable.   Other: Fat containing umbilical hernia.   Musculoskeletal: Internal fixation of the proximal right femur. Degenerative changes in the imaged osseous structures.   IMPRESSION: 1. Minimal access vessel diameters detailed above. 2. 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 3. 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     Electronically Signed   By: Maude Naegeli M.D.   On: 08/09/2024 05:49        Impression:   This 78 year old woman has stage D, severe, low-flow/low gradient aortic stenosis with NYHA class II symptoms of exertional fatigue and shortness of breath consistent with chronic diastolic congestive heart failure.  She has also been having episodes of dizziness.  I have personally reviewed her 2D echocardiogram, cardiac  catheterization, and CTA studies.  Her echo shows a severely calcified aortic valve with a mean gradient of 26.5 mmHg and a valve area of 0.93 cm consistent with severe aortic stenosis.  Left ventricular ejection fraction is decreased to 40 to 45%.  Her valve looks severely stenotic on echocardiogram.  There is moderate to severe tricuspid valve regurgitation but she has no peripheral edema.  Her right ventricle is mildly enlarged with normal systolic function.  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  This can be treated medically.  I agree that aortic valve replacement is indicated in this patient for relief of her symptoms and to prevent further left ventricular dysfunction.  Given her age, comorbidities including decreased mobility, and calcification of her aortic root I do not think she is a good candidate for open surgical aortic valve replacement.  I think transcatheter aortic valve replacement be the best option for treating her.  Her gated cardiac CTA shows anatomy suitable for TAVR using a 23 mm Edwards SAPIEN 3 valve.  Her abdominal and pelvic CTA shows adequate pelvic vascular anatomy to allow transfemoral insertion.  She has right bundle branch block morphology on her EKG and will have a right IJ  pacer placed for the procedure due to her higher risk of complete heart block.   The patient was counseled at length regarding treatment alternatives for management of severe symptomatic aortic stenosis. The risks and benefits of surgical intervention has been discussed in detail. Long-term prognosis with medical therapy was discussed. Alternative approaches such as conventional surgical aortic valve replacement, transcatheter aortic valve replacement, and palliative medical therapy were compared and contrasted at length. This discussion was placed in the context of the patient's own specific clinical presentation and past  medical history. All of her questions have been addressed.    Following the decision to proceed with transcatheter aortic valve replacement, a discussion was held regarding what types of management strategies would be attempted intraoperatively in the event of life-threatening complications, including whether or not the patient would be considered a candidate for the use of cardiopulmonary bypass and/or conversion to open sternotomy for attempted surgical intervention.  Given her age, comorbidities, reduced mobility and calcified aortic root I think she would only be a candidate for emergent sternotomy under limited circumstances depending on the situation.  The patient has been advised of a variety of complications that might develop including but not limited to risks of death, stroke, paravalvular leak, aortic dissection or other major vascular complications, aortic annulus rupture, device embolization, cardiac rupture or perforation, mitral regurgitation, acute myocardial infarction, arrhythmia, heart block or bradycardia requiring permanent pacemaker placement, congestive heart failure, respiratory failure, renal failure, pneumonia, infection, other late complications related to structural valve deterioration or migration, or other complications that might ultimately cause a temporary or permanent loss of functional independence or other long term morbidity. The patient provides full informed consent for the procedure as described and all questions were answered.     Plan:   Transfemoral TAVR using a SAPIEN 3 valve.    Amy LOIS Fellers, MD

## 2024-09-06 ENCOUNTER — Encounter (HOSPITAL_COMMUNITY): Payer: Self-pay | Admitting: Internal Medicine

## 2024-09-06 ENCOUNTER — Other Ambulatory Visit: Payer: Self-pay

## 2024-09-06 ENCOUNTER — Inpatient Hospital Stay (HOSPITAL_COMMUNITY): Payer: Self-pay | Admitting: Certified Registered Nurse Anesthetist

## 2024-09-06 ENCOUNTER — Inpatient Hospital Stay (HOSPITAL_COMMUNITY)
Admission: RE | Admit: 2024-09-06 | Discharge: 2024-09-08 | DRG: 267 | Disposition: A | Discharge status: Home / Self Care | Attending: Surgery | Admitting: Surgery

## 2024-09-06 ENCOUNTER — Inpatient Hospital Stay (HOSPITAL_COMMUNITY)

## 2024-09-06 ENCOUNTER — Encounter (HOSPITAL_COMMUNITY)
Admission: RE | Disposition: A | Discharge status: Home / Self Care | Payer: Self-pay | Source: Home / Self Care | Attending: Surgery

## 2024-09-06 ENCOUNTER — Inpatient Hospital Stay (HOSPITAL_COMMUNITY): Payer: Self-pay | Admitting: Physician Assistant

## 2024-09-06 DIAGNOSIS — R008 Other abnormalities of heart beat: Secondary | ICD-10-CM | POA: Diagnosis not present

## 2024-09-06 DIAGNOSIS — I442 Atrioventricular block, complete: Secondary | ICD-10-CM | POA: Diagnosis not present

## 2024-09-06 DIAGNOSIS — E785 Hyperlipidemia, unspecified: Secondary | ICD-10-CM | POA: Diagnosis present

## 2024-09-06 DIAGNOSIS — I509 Heart failure, unspecified: Secondary | ICD-10-CM

## 2024-09-06 DIAGNOSIS — K219 Gastro-esophageal reflux disease without esophagitis: Secondary | ICD-10-CM | POA: Diagnosis present

## 2024-09-06 DIAGNOSIS — Z885 Allergy status to narcotic agent status: Secondary | ICD-10-CM | POA: Diagnosis not present

## 2024-09-06 DIAGNOSIS — I350 Nonrheumatic aortic (valve) stenosis: Secondary | ICD-10-CM | POA: Diagnosis not present

## 2024-09-06 DIAGNOSIS — E119 Type 2 diabetes mellitus without complications: Secondary | ICD-10-CM | POA: Diagnosis not present

## 2024-09-06 DIAGNOSIS — I493 Ventricular premature depolarization: Secondary | ICD-10-CM | POA: Diagnosis not present

## 2024-09-06 DIAGNOSIS — I428 Other cardiomyopathies: Secondary | ICD-10-CM | POA: Diagnosis present

## 2024-09-06 DIAGNOSIS — I272 Pulmonary hypertension, unspecified: Secondary | ICD-10-CM | POA: Diagnosis not present

## 2024-09-06 DIAGNOSIS — Z888 Allergy status to other drugs, medicaments and biological substances status: Secondary | ICD-10-CM | POA: Diagnosis not present

## 2024-09-06 DIAGNOSIS — I110 Hypertensive heart disease with heart failure: Secondary | ICD-10-CM | POA: Diagnosis not present

## 2024-09-06 DIAGNOSIS — I252 Old myocardial infarction: Secondary | ICD-10-CM | POA: Diagnosis not present

## 2024-09-06 DIAGNOSIS — I4510 Unspecified right bundle-branch block: Secondary | ICD-10-CM | POA: Diagnosis not present

## 2024-09-06 DIAGNOSIS — Z7984 Long term (current) use of oral hypoglycemic drugs: Secondary | ICD-10-CM | POA: Diagnosis not present

## 2024-09-06 DIAGNOSIS — I700 Atherosclerosis of aorta: Secondary | ICD-10-CM | POA: Diagnosis not present

## 2024-09-06 DIAGNOSIS — I2510 Atherosclerotic heart disease of native coronary artery without angina pectoris: Secondary | ICD-10-CM | POA: Diagnosis present

## 2024-09-06 DIAGNOSIS — Z7982 Long term (current) use of aspirin: Secondary | ICD-10-CM | POA: Diagnosis not present

## 2024-09-06 DIAGNOSIS — Z006 Encounter for examination for normal comparison and control in clinical research program: Secondary | ICD-10-CM | POA: Diagnosis not present

## 2024-09-06 DIAGNOSIS — Z952 Presence of prosthetic heart valve: Secondary | ICD-10-CM

## 2024-09-06 DIAGNOSIS — Z7409 Other reduced mobility: Secondary | ICD-10-CM | POA: Diagnosis present

## 2024-09-06 DIAGNOSIS — R7303 Prediabetes: Secondary | ICD-10-CM | POA: Diagnosis present

## 2024-09-06 DIAGNOSIS — Z79899 Other long term (current) drug therapy: Secondary | ICD-10-CM | POA: Diagnosis not present

## 2024-09-06 DIAGNOSIS — Z6832 Body mass index (BMI) 32.0-32.9, adult: Secondary | ICD-10-CM

## 2024-09-06 DIAGNOSIS — E669 Obesity, unspecified: Secondary | ICD-10-CM | POA: Diagnosis present

## 2024-09-06 DIAGNOSIS — I5042 Chronic combined systolic (congestive) and diastolic (congestive) heart failure: Secondary | ICD-10-CM

## 2024-09-06 DIAGNOSIS — Z96653 Presence of artificial knee joint, bilateral: Secondary | ICD-10-CM | POA: Diagnosis present

## 2024-09-06 DIAGNOSIS — I062 Rheumatic aortic stenosis with insufficiency: Secondary | ICD-10-CM | POA: Diagnosis present

## 2024-09-06 DIAGNOSIS — I071 Rheumatic tricuspid insufficiency: Secondary | ICD-10-CM | POA: Diagnosis not present

## 2024-09-06 DIAGNOSIS — R911 Solitary pulmonary nodule: Secondary | ICD-10-CM | POA: Diagnosis present

## 2024-09-06 HISTORY — DX: Nonrheumatic aortic (valve) stenosis: I35.0

## 2024-09-06 HISTORY — DX: Unspecified right bundle-branch block: I45.10

## 2024-09-06 HISTORY — PX: INTRAOPERATIVE TRANSTHORACIC ECHOCARDIOGRAM: SHX6523

## 2024-09-06 LAB — ECHOCARDIOGRAM LIMITED
AR max vel: 2.07 cm2
AV Area VTI: 1.77 cm2
AV Area mean vel: 2.07 cm2
AV Mean grad: 3 mmHg
AV Peak grad: 5.9 mmHg
Ao pk vel: 1.21 m/s
Est EF: 30
S' Lateral: 4.2 cm

## 2024-09-06 LAB — POCT ACTIVATED CLOTTING TIME: Activated Clotting Time: 314 s

## 2024-09-06 SURGERY — TRANSCATHETER AORTIC VALVE REPLACEMENT, TRANSFEMORAL (CATHLAB)
Anesthesia: Monitor Anesthesia Care | Laterality: Right

## 2024-09-06 MED ORDER — CHLORHEXIDINE GLUCONATE 4 % EX SOLN
60.0000 mL | Freq: Once | CUTANEOUS | Status: DC
  Filled 2024-09-06: qty 60

## 2024-09-06 MED ORDER — ONDANSETRON HCL 4 MG/2ML IJ SOLN: 4.0000 mg | Freq: Four times a day (QID) | INTRAMUSCULAR | Status: DC | PRN

## 2024-09-06 MED ORDER — LACTATED RINGERS IV SOLN: INTRAVENOUS | Status: DC | PRN

## 2024-09-06 MED ORDER — SODIUM CHLORIDE 0.9 % IV SOLN: 250.0000 mL | INTRAVENOUS | Status: DC | PRN

## 2024-09-06 MED ORDER — HEPARIN (PORCINE) IN NACL 2000-0.9 UNIT/L-% IV SOLN
INTRAVENOUS | Status: DC | PRN
  Administered 2024-09-06: 1000 mL

## 2024-09-06 MED ORDER — MIDAZOLAM HCL 2 MG/2ML IJ SOLN
INTRAMUSCULAR | Status: AC
  Filled 2024-09-06: qty 2

## 2024-09-06 MED ORDER — SODIUM CHLORIDE 0.9 % IV SOLN: INTRAVENOUS | Status: AC

## 2024-09-06 MED ORDER — PHENYLEPHRINE HCL (PRESSORS) 10 MG/ML IV SOLN
INTRAVENOUS | Status: DC | PRN
Start: 2024-09-06 — End: 2024-09-06
  Administered 2024-09-06 (×6): 80 ug via INTRAVENOUS

## 2024-09-06 MED ORDER — IODIXANOL 320 MG/ML IV SOLN
INTRAVENOUS | Status: DC | PRN
  Administered 2024-09-06: 60 mL

## 2024-09-06 MED ORDER — LACTATED RINGERS IV SOLN: INTRAVENOUS | Status: DC

## 2024-09-06 MED ORDER — SODIUM CHLORIDE 0.9 % IV SOLN: INTRAVENOUS | Status: DC | PRN

## 2024-09-06 MED ORDER — OXYCODONE HCL 5 MG PO TABS
5.0000 mg | ORAL_TABLET | ORAL | Status: DC | PRN
  Administered 2024-09-06: 10 mg via ORAL
  Filled 2024-09-06: qty 2

## 2024-09-06 MED ORDER — ACETAMINOPHEN 650 MG RE SUPP: 650.0000 mg | Freq: Four times a day (QID) | RECTAL | Status: DC | PRN

## 2024-09-06 MED ORDER — ORAL CARE MOUTH RINSE: 15.0000 mL | OROMUCOSAL | Status: DC | PRN

## 2024-09-06 MED ORDER — FENTANYL CITRATE (PF) 100 MCG/2ML IJ SOLN
INTRAMUSCULAR | Status: AC
  Filled 2024-09-06: qty 2

## 2024-09-06 MED ORDER — HEPARIN SODIUM (PORCINE) 1000 UNIT/ML IJ SOLN
INTRAMUSCULAR | Status: DC | PRN
  Administered 2024-09-06: 10000 [IU] via INTRAVENOUS

## 2024-09-06 MED ORDER — HEPARIN (PORCINE) IN NACL 1000-0.9 UT/500ML-% IV SOLN
INTRAVENOUS | Status: DC | PRN
  Administered 2024-09-06: 500 mL

## 2024-09-06 MED ORDER — CHLORHEXIDINE GLUCONATE 4 % EX SOLN: 30.0000 mL | CUTANEOUS | Status: DC

## 2024-09-06 MED ORDER — NOREPINEPHRINE 4 MG/250ML-% IV SOLN: 0.0000 ug/min | INTRAVENOUS | Status: DC

## 2024-09-06 MED ORDER — MIDAZOLAM HCL (PF) 2 MG/2ML IJ SOLN
INTRAMUSCULAR | Status: DC | PRN
  Administered 2024-09-06 (×2): .5 mg via INTRAVENOUS

## 2024-09-06 MED ORDER — SODIUM CHLORIDE 0.9% FLUSH: 3.0000 mL | INTRAVENOUS | Status: DC | PRN

## 2024-09-06 MED ORDER — PROTAMINE SULFATE 10 MG/ML IV SOLN
INTRAVENOUS | Status: DC | PRN
  Administered 2024-09-06 (×2): 30 mg via INTRAVENOUS
  Administered 2024-09-06 (×2): 20 mg via INTRAVENOUS

## 2024-09-06 MED ORDER — LIDOCAINE HCL (PF) 1 % IJ SOLN
INTRAMUSCULAR | Status: DC | PRN
  Administered 2024-09-06: 5 mL via INTRADERMAL
  Administered 2024-09-06 (×2): 2 mL via INTRADERMAL

## 2024-09-06 MED ORDER — ASPIRIN 81 MG PO TBEC
81.0000 mg | DELAYED_RELEASE_TABLET | Freq: Every evening | ORAL | Status: DC
  Administered 2024-09-06 – 2024-09-07 (×2): 81 mg via ORAL
  Filled 2024-09-06 (×2): qty 1

## 2024-09-06 MED ORDER — ACETAMINOPHEN 325 MG PO TABS: 650.0000 mg | ORAL_TABLET | Freq: Four times a day (QID) | ORAL | Status: DC | PRN

## 2024-09-06 MED ORDER — SODIUM CHLORIDE 0.9% FLUSH
3.0000 mL | Freq: Two times a day (BID) | INTRAVENOUS | Status: DC
  Administered 2024-09-06 – 2024-09-08 (×4): 3 mL via INTRAVENOUS

## 2024-09-06 MED ORDER — TRAMADOL HCL 50 MG PO TABS
50.0000 mg | ORAL_TABLET | ORAL | Status: DC | PRN
  Administered 2024-09-07 – 2024-09-08 (×2): 50 mg via ORAL
  Filled 2024-09-06 (×2): qty 1

## 2024-09-06 MED ORDER — FENTANYL CITRATE (PF) 100 MCG/2ML IJ SOLN
INTRAMUSCULAR | Status: DC | PRN
  Administered 2024-09-06 (×2): 25 ug via INTRAVENOUS

## 2024-09-06 MED ORDER — ATORVASTATIN CALCIUM 80 MG PO TABS
80.0000 mg | ORAL_TABLET | Freq: Every day | ORAL | Status: DC
  Administered 2024-09-06 – 2024-09-08 (×3): 80 mg via ORAL
  Filled 2024-09-06 (×3): qty 1

## 2024-09-06 MED ORDER — SODIUM CHLORIDE 0.9 % IV SOLN: INTRAVENOUS | Status: DC

## 2024-09-06 MED ORDER — CHLORHEXIDINE GLUCONATE CLOTH 2 % EX PADS
6.0000 | MEDICATED_PAD | Freq: Every day | CUTANEOUS | Status: DC
  Administered 2024-09-06 – 2024-09-07 (×2): 6 via TOPICAL

## 2024-09-06 MED ORDER — LORATADINE 10 MG PO TABS
10.0000 mg | ORAL_TABLET | Freq: Every day | ORAL | Status: DC
  Administered 2024-09-06 – 2024-09-08 (×3): 10 mg via ORAL
  Filled 2024-09-06 (×3): qty 1

## 2024-09-06 MED ORDER — LIDOCAINE HCL (PF) 1 % IJ SOLN
INTRAMUSCULAR | Status: AC
  Filled 2024-09-06: qty 30

## 2024-09-06 MED ORDER — CEFAZOLIN SODIUM-DEXTROSE 2-4 GM/100ML-% IV SOLN
2.0000 g | Freq: Three times a day (TID) | INTRAVENOUS | Status: AC
  Administered 2024-09-06 (×2): 2 g via INTRAVENOUS
  Filled 2024-09-06 (×2): qty 100

## 2024-09-06 MED ORDER — MECLIZINE HCL 25 MG PO TABS
25.0000 mg | ORAL_TABLET | Freq: Every day | ORAL | Status: DC
  Administered 2024-09-07 – 2024-09-08 (×2): 25 mg via ORAL
  Filled 2024-09-06 (×2): qty 1

## 2024-09-06 MED ORDER — NITROGLYCERIN IN D5W 200-5 MCG/ML-% IV SOLN: 0.0000 ug/min | INTRAVENOUS | Status: DC

## 2024-09-06 MED ORDER — CHLORHEXIDINE GLUCONATE 0.12 % MT SOLN
15.0000 mL | Freq: Once | OROMUCOSAL | Status: AC
  Administered 2024-09-06: 15 mL via OROMUCOSAL
  Filled 2024-09-06: qty 15

## 2024-09-06 SURGICAL SUPPLY — 30 items
BAG SNAP BAND KOVER 36X36 (MISCELLANEOUS) ×6 IMPLANT
CABLE ADAPT PACING TEMP 12FT (ADAPTER) IMPLANT
CATH 23 ULTRA DELIVERY (CATHETERS) IMPLANT
CATH DIAG 6FR PIGTAIL ANGLED (CATHETERS) IMPLANT
CATH INFINITI 5FR ANG PIGTAIL (CATHETERS) IMPLANT
CATH INFINITI 6F AL1 (CATHETERS) IMPLANT
CLOSURE MYNX CONTROL 6F/7F (Vascular Products) IMPLANT
CLOSURE PERCLOSE PROSTYLE (Vascular Products) IMPLANT
CRIMPER (MISCELLANEOUS) IMPLANT
DEVICE INFLATION ATRION QL2530 (MISCELLANEOUS) IMPLANT
ELECT DEFIB PAD ADLT CADENCE (PAD) IMPLANT
KIT SAPIAN 3 ULTRA RESILIA 23 (Valve) IMPLANT
PACK CARDIAC CATHETERIZATION (CUSTOM PROCEDURE TRAY) ×3 IMPLANT
SET ATX-X65L (MISCELLANEOUS) IMPLANT
SHEATH INTRODUCER SET 20-26 (SHEATH) IMPLANT
SHEATH PINNACLE 6F 10CM (SHEATH) IMPLANT
SHEATH PINNACLE 8F 10CM (SHEATH) IMPLANT
SHIELD CATHGARD ARROW (MISCELLANEOUS) IMPLANT
STOPCOCK MORSE 400PSI 3WAY (MISCELLANEOUS) ×6 IMPLANT
SYR CONTROL 10ML ANGIOGRAPHIC (SYRINGE) IMPLANT
TRANSDUCER W/STOPCOCK (MISCELLANEOUS) IMPLANT
TUBING ART PRESS 72 MALE/FEM (TUBING) IMPLANT
TUBING CIL FLEX 10 FLL-RA (TUBING) IMPLANT
WIRE AMPLATZ SS-J .035X180CM (WIRE) IMPLANT
WIRE EMERALD 3MM-J .035X150CM (WIRE) IMPLANT
WIRE EMERALD 3MM-J .035X260CM (WIRE) IMPLANT
WIRE EMERALD ST .035X260CM (WIRE) IMPLANT
WIRE MICRO SET SILHO 5FR 7 (SHEATH) IMPLANT
WIRE PACING TEMP ST TIP 5 (CATHETERS) IMPLANT
WIRE SAFARI SM CURVE 275 (WIRE) IMPLANT

## 2024-09-06 NOTE — Transfer of Care (Signed)
 Immediate Anesthesia Transfer of Care Note  Patient: Amy [name]  Procedure(s) Performed: Transcatheter Aortic Valve Replacement, Transfemoral (Right) ECHOCARDIOGRAM, TRANSTHORACIC  Patient Location: PACU and ICU  Anesthesia Type:MAC  Level of Consciousness: awake, alert , and oriented  Airway & Oxygen Therapy: Patient connected to nasal cannula oxygen  Post-op Assessment: Report given to RN and Post -op Vital signs reviewed and stable  Post vital signs: Reviewed and stable  Last Vitals:  Vitals Value Taken Time  BP 104/64 09/06/24 14:00  Temp    Pulse 76 09/06/24 14:02  Resp 32 09/06/24 14:02  SpO2 100 % 09/06/24 14:02  Vitals shown include unfiled device data.  Last Pain:  Vitals:   09/06/24 1221  TempSrc:   PainSc: 0-No pain         Complications: There were no known notable events for this encounter.

## 2024-09-06 NOTE — Discharge Instructions (Signed)

## 2024-09-06 NOTE — Anesthesia Preprocedure Evaluation (Addendum)
 Anesthesia Evaluation  Patient identified by MRN, date of birth, ID band Patient awake    Reviewed: Allergy & Precautions, NPO status , Patient's Chart, lab work & pertinent test results, reviewed documented beta blocker date and time   History of Anesthesia Complications Negative for: history of anesthetic complications  Airway Mallampati: II  TM Distance: >3 FB     Dental no notable dental hx.    Pulmonary neg COPD   breath sounds clear to auscultation       Cardiovascular + CAD, + Past MI and +CHF  + dysrhythmias + Valvular Problems/Murmurs (severe AS, severe TR) AS  Rhythm:Regular Rate:Normal + Systolic murmurs IMPRESSIONS     1. Akinesis of the inferolateral and inferoseptal walls; overall EF  40-45. Calcified aortic valve with severe AS (mean gradient 26.5 mmHg; AVA  0.93 cm2); trace AI.   2. Left ventricular ejection fraction, by estimation, is 40 to 45%. The  left ventricle has mildly decreased function. The left ventricle  demonstrates regional wall motion abnormalities (see scoring  diagram/findings for description). Left ventricular  diastolic parameters are consistent with Grade I diastolic dysfunction  (impaired relaxation). There is the interventricular septum is flattened  in systole and diastole, consistent with right ventricular pressure and  volume overload.   3. Right ventricular systolic function is normal. The right ventricular  size is mildly enlarged.   4. The mitral valve is normal in structure. Mild mitral valve  regurgitation. No evidence of mitral stenosis. Moderate mitral annular  calcification.   5. Tricuspid valve regurgitation is moderate to severe.   6. The aortic valve is calcified. Aortic valve regurgitation is trivial.  Severe aortic valve stenosis.   7. The inferior vena cava is normal in size with greater than 50%  respiratory variability, suggesting right atrial pressure of 3 mmHg.      Neuro/Psych neg Seizures    GI/Hepatic ,GERD  ,,  Endo/Other    Renal/GU Renal disease     Musculoskeletal   Abdominal   Peds  Hematology   Anesthesia Other Findings   Reproductive/Obstetrics                              Anesthesia Physical Anesthesia Plan  ASA: 4  Anesthesia Plan: MAC   Post-op Pain Management:    Induction: Intravenous  PONV Risk Score and Plan: 2 and Ondansetron   Airway Management Planned: Natural Airway and Simple Face Mask  Additional Equipment:   Intra-op Plan:   Post-operative Plan:   Informed Consent: I have reviewed the patients History and Physical, chart, labs and discussed the procedure including the risks, benefits and alternatives for the proposed anesthesia with the patient or authorized representative who has indicated his/her understanding and acceptance.     Dental advisory given  Plan Discussed with: CRNA  Anesthesia Plan Comments:          Anesthesia Quick Evaluation

## 2024-09-06 NOTE — Progress Notes (Signed)
  [facility]   MULTIDISCIPLINARY HEART VALVE TEAM  Patient doing well s/p TAVR. She is hemodynamically stable. Groin sites stable. RIJ temp wire in place. In ICU. ECG not completed but tele with sinus with few paced beats but temp wire set to pace at 60 bpm. I turned pacer down to 40 to watch for recurrent CHB.  Continue to watch rhythm overnight. Hold off on EP consult for now.    Lamarr Hummer PA-C  [facility]  Pager [phone]

## 2024-09-06 NOTE — Op Note (Signed)
 [facility]   MULTIDISCIPLINARY HEART VALVE TEAM   TAVR OPERATIVE NOTE   Date of Procedure:  09/06/2024  Preoperative Diagnosis: Severe Aortic Stenosis   Postoperative Diagnosis: Same   Procedure:   Transcatheter Aortic Valve Replacement - Percutaneous Right Transfemoral Approach  Edwards Sapien 3 Ultra Resilia THV (size 23 mm, model # 9755RSL, serial # [id number])   Co-Surgeons:  [name], MD and Lurena Red, MD   Anesthesiologist:  Lynwood POUR. Keneth, MD  Echocardiographer:  Jerel Balding, MD  Pre-operative Echo Findings: Severe aortic stenosis Moderate left ventricular systolic dysfunction  Post-operative Echo Findings: No paravalvular leak Unchanged moderate left ventricular systolic dysfunction   BRIEF CLINICAL NOTE AND INDICATIONS FOR SURGERY  This 78 year old woman has stage D, severe, low-flow/low gradient aortic stenosis with NYHA class II symptoms of exertional fatigue and shortness of breath consistent with chronic diastolic congestive heart failure.  She has also been having episodes of dizziness.  I have personally reviewed her 2D echocardiogram, cardiac catheterization, and CTA studies.  Her echo shows a severely calcified aortic valve with a mean gradient of 26.5 mmHg and a valve area of 0.93 cm consistent with severe aortic stenosis.  Left ventricular ejection fraction is decreased to 40 to 45%.  Her valve looks severely stenotic on echocardiogram.  There is moderate to severe tricuspid valve regurgitation but she has no peripheral edema.  Her right ventricle is mildly enlarged with normal systolic function.  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  This can be treated medically.  I agree that aortic valve replacement is indicated in this patient for relief of her symptoms and to prevent further left ventricular dysfunction.  Given her age, comorbidities including  decreased mobility, and calcification of her aortic root I do not think she is a good candidate for open surgical aortic valve replacement.  I think transcatheter aortic valve replacement be the best option for treating her.  Her gated cardiac CTA shows anatomy suitable for TAVR using a 23 mm Edwards SAPIEN 3 valve.  Her abdominal and pelvic CTA shows adequate pelvic vascular anatomy to allow transfemoral insertion.  She has right bundle branch block morphology on her EKG and will have a right IJ pacer placed for the procedure due to her higher risk of complete heart block.   The patient was counseled at length regarding treatment alternatives for management of severe symptomatic aortic stenosis. The risks and benefits of surgical intervention has been discussed in detail. Long-term prognosis with medical therapy was discussed. Alternative approaches such as conventional surgical aortic valve replacement, transcatheter aortic valve replacement, and palliative medical therapy were compared and contrasted at length. This discussion was placed in the context of the patient's own specific clinical presentation and past medical history. All of her questions have been addressed.    Following the decision to proceed with transcatheter aortic valve replacement, a discussion was held regarding what types of management strategies would be attempted intraoperatively in the event of life-threatening complications, including whether or not the patient would be considered a candidate for the use of cardiopulmonary bypass and/or conversion to open sternotomy for attempted surgical intervention.  Given her age, comorbidities, reduced mobility and calcified aortic root I think she would only be a candidate for emergent sternotomy under limited circumstances depending on the situation.  The patient has been advised of a variety of complications that might develop including but not limited to risks  of death, stroke, paravalvular  leak, aortic dissection or other major vascular complications, aortic annulus rupture, device embolization, cardiac rupture or perforation, mitral regurgitation, acute myocardial infarction, arrhythmia, heart block or bradycardia requiring permanent pacemaker placement, congestive heart failure, respiratory failure, renal failure, pneumonia, infection, other late complications related to structural valve deterioration or migration, or other complications that might ultimately cause a temporary or permanent loss of functional independence or other long term morbidity. The patient provides full informed consent for the procedure as described and all questions were answered.     DETAILS OF THE OPERATIVE PROCEDURE  PREPARATION:    The patient was brought to the operating room on the above mentioned date and appropriate monitoring was established by the anesthesia team. The patient was placed in the supine position on the operating table.  Intravenous antibiotics were administered. The patient was monitored closely throughout the procedure under conscious sedation.    Baseline transthoracic echocardiogram was performed. The patient's abdomen and both groins were prepped and draped in a sterile manner. A time out procedure was performed.   PERIPHERAL ACCESS:    Using the modified Seldinger technique, a right internal jugular 6 Fr sheath was placed using US  guidance.  A temporary transvenous pacemaker catheter was passed through the sheath under fluoroscopic guidance into the right ventricle.  The pacemaker was tested to ensure stable lead placement and pacemaker capture. Then left femoral arterial access was obtained with placement of a 6 Fr sheath on the left side.  A pigtail diagnostic catheter was passed through the left arterial sheath under fluoroscopic guidance into the aortic root. Aortic root angiography was performed in order to determine the optimal angiographic angle for valve  deployment.   TRANSFEMORAL ACCESS:   Percutaneous transfemoral access and sheath placement was performed using ultrasound guidance.  The right common femoral artery was cannulated using a micropuncture needle and appropriate location was verified using hand injection angiogram.  A pair of Abbott Perclose percutaneous closure devices were placed and a 6 French sheath replaced into the femoral artery.  The patient was heparinized systemically and ACT verified > 250 seconds.    A 14 Fr transfemoral E-sheath was introduced into the right common femoral artery after progressively dilating over an Amplatz superstiff wire. An AL-1 catheter was used to direct a straight-tip exchange length wire across the native aortic valve into the left ventricle. This was exchanged out for a pigtail catheter and position was confirmed in the LV apex. Simultaneous LV and Ao pressures were recorded.  The pigtail catheter was exchanged for a Safari wire in the LV apex. After the J-wire and Safari wire was inserted the patient developed complete heart block and had to be paced using the temporary transvenous pacer wire.  BALLOON AORTIC VALVULOPLASTY:   Not performed   TRANSCATHETER HEART VALVE DEPLOYMENT:   An Edwards Sapien 3 Ultra transcatheter heart valve (size 23 mm) was prepared and crimped per manufacturer's guidelines, and the proper orientation of the valve is confirmed on the [employer] delivery system. The valve was advanced through the introducer sheath using normal technique until in an appropriate position in the abdominal aorta beyond the sheath tip. The balloon was then retracted and using the fine-tuning wheel was centered on the valve. The valve was then advanced across the aortic arch using appropriate flexion of the catheter. The valve was carefully positioned across the aortic valve annulus. The Commander catheter was retracted using normal technique. Once final position of the valve has been  confirmed by angiographic assessment, the valve is deployed during rapid ventricular pacing to maintain systolic blood pressure < 50 mmHg and pulse pressure < 10 mmHg. The balloon inflation is held for >3 seconds after reaching full deployment volume. Once the balloon has fully deflated the balloon is retracted into the ascending aorta and valve function is assessed using echocardiography. There is felt to be no paravalvular leak and no central aortic insufficiency.  The patient's hemodynamic recovery following valve deployment is good.  The deployment balloon and guidewire are both removed.    PROCEDURE COMPLETION:   The sheath was removed and femoral artery closure performed.  Protamine was administered once femoral arterial repair was complete. The temporary pacemaker was left in place. The pigtail catheter and left femoral sheath were removed with a Mynx femoral closure device  utilized following removal of the diagnostic sheath in the left femoral artery.  The patient tolerated the procedure well and is transported to the ICU in stable condition. There were no immediate intraoperative complications except development of complete heart block. All sponge instrument and needle counts are verified correct at completion of the operation.   No blood products were administered during the operation.  The patient received a total of 60 mL of intravenous contrast during the procedure.   Dorise MARLA Fellers, MD 09/06/2024

## 2024-09-06 NOTE — Interval H&P Note (Signed)
 History and Physical Interval Note:  09/06/2024 10:09 AM  Amy [name] Flight  has presented today for surgery, with the diagnosis of Severe Aortic Stenosis.  The various methods of treatment have been discussed with the patient and family. After consideration of risks, benefits and other options for treatment, the patient has consented to  Procedure(s): Transcatheter Aortic Valve Replacement, Transfemoral (Right) ECHOCARDIOGRAM, TRANSTHORACIC (N/A) as a surgical intervention.  The patient's history has been reviewed, patient examined, no change in status, stable for surgery.  I have reviewed the patient's chart and labs.  Questions were answered to the patient's satisfaction.     Radha Coggins K Ricky Gallery

## 2024-09-06 NOTE — Progress Notes (Signed)
 Lamarr Hummer, PA made aware of patient's urinalysis result from 09/02/24. No new orders received.

## 2024-09-06 NOTE — Discharge Summary (Signed)
 HEART AND VASCULAR CENTER   MULTIDISCIPLINARY HEART VALVE TEAM  Discharge Summary    Patient ID: Amy [name] MRN: [id number]; DOB: 11/14/1946  Admit date: 09/06/2024 Discharge date: 09/08/2024  PCP:  Amy Elsie SAUNDERS, MD  [facility] Cardiologist:  Shelda Bruckner, MD  [facility] Structural heart: [name], MD [facility] Electrophysiologist:  None   Discharge Diagnoses    Principal Problem:   S/P TAVR (transcatheter aortic valve replacement) Active Problems:   NICM (nonischemic cardiomyopathy) (HCC)   Nonocclusive coronary atherosclerosis of native coronary artery   Chronic combined systolic and diastolic heart failure (HCC)   Severe aortic stenosis   Dyslipidemia   Prediabetes   Obesity (BMI 30-39.9)   Obesity   RBBB   Allergies Allergies  Allergen Reactions   Timolol Shortness Of Breath    Patient does not recall shortness of breath; believes it caused itching/pain in eye.   Codeine Nausea And Vomiting   Morphine And Codeine Nausea And Vomiting   Other Other (See Comments)    vicryl sutures - rash/redness, delay in healing    Diagnostic Studies/Procedures    HEART AND VASCULAR CENTER  TAVR OPERATIVE NOTE     Date of Procedure:                09/06/2024   Preoperative Diagnosis:      Severe Aortic Stenosis    Postoperative Diagnosis:Same and complete heart block   Procedure:        Transcatheter Aortic Valve Replacement - Transfemoral Approach             Edwards Sapien 3 Resilia THV (size 23 mm, model # 9755RLS,)              Co-Surgeons:                        [name], MD and Lurena Red, MD Anesthesiologist:                  Keneth   Echocardiographer:              Croitoru   Pre-operative Echo Findings: Severe aortic stenosis Moderate left ventricular systolic dysfunction   Post-operative Echo Findings: No paravalvular leak Moderate left ventricular systolic dysfunction   Left Heart Catheterization  Findings: Left ventricular end-diastolic pressure of 15 mmHg   _____________    Echo 09/07/24:  IMPRESSIONS   1. Left ventricular ejection fraction, by estimation, is 35 to 40%. The  left ventricle has moderately decreased function. Left ventricular  endocardial border not optimally defined to evaluate regional wall motion.  The left ventricular internal cavity  size was mildly dilated. Left ventricular diastolic parameters are  consistent with Grade I diastolic dysfunction (impaired relaxation). There  is hypokinesis of the left ventricular, mid inferolateral wall. There is  akinesis of the left ventricular,  entire inferior wall.   2. Right ventricular systolic function is normal. The right ventricular  size is moderately enlarged. There is mildly elevated pulmonary artery  systolic pressure.   3. Left atrial size was moderately dilated.   4. Right atrial size was mildly dilated.   5. The mitral valve is degenerative. Trivial mitral valve regurgitation.  Moderate mitral annular calcification.   6. Tricuspid valve regurgitation is severe.   7. The aortic valve has been repaired/replaced. Aortic valve  regurgitation is trivial. There is a 23 mm Sapien prosthetic (TAVR) valve  present in the aortic position. Procedure Date:  09/06/24. Aortic valve  area, by VTI measures 1.05 cm. Aortic valve  mean gradient measures 8.5 mmHg. Aortic valve Vmax measures 1.88 m/s.   8. The inferior vena cava is normal in size with greater than 50%  respiratory variability, suggesting right atrial pressure of 3 mmHg.   Comparison(s): Prior images reviewed side by side. EF slightly improved  compared to immediate pre and post images from 09/06/24; wall motion not  well visualized in apical views, contrast attempted but was unsuccessful.  TAVR stable with trivial  perivalvular leak.   History of Present Illness     Amy [name] is a 78 y.o. female with a history of HTN, HLD, prediabetes, CAD  (STEMI 02/2022) RBBB, sev TR, HFrEF, and low-flow low gradient AS who presented to [facility] on 09/06/24 for planned TAVR.   She had a late presentation STEMI and April 2023 with a reduced ejection fraction of 30 to 35%. Cath with CAD with interval occlusion of ostial RCA with associate large RV infarct, borderline 60% LM lesion, mild AS and hemodynamics c/w with severe RV failure and severe TR.  She was supported with inotropes and developed a transient second-degree Mobitz type II block which resolved.  She was started on GDMT with improvement in her ejection fraction to 40 to 45%.  She has been followed in the advanced heart failure clinic with NYHA class II symptoms.  Her most recent echo on 07/13/2024 showed EF 40%, and LFLG AS with mean grad 26.5 mmHg, Vmax 3.52 m/s, AVA 0.93 cm2 as well as mild MR and mod-Sev TR. [facility] 07/29/24 showed a chronically occluded mid RCA with mild to moderate disease in the LAD and left circumflex. Right heart pressures elevated and torsemide  changed from PRN to daily.   The patient was evaluated by the multidisciplinary valve team and felt to have severe, symptomatic aortic stenosis and to be a suitable candidate for TAVR, which was set up for 09/06/24.   Hospital Course     Consultants: none   Severe LFLG AS:  -- S/p successful TAVR with a 23 mm Edwards Sapien 3 Ultra Resilia THV via the TF approach on 09/06/2024.  -- Post operative echo showed EF 35%, LV dilation, mod RVE, mild pulm HTN, moderate MAC, severe TR and normally functioning TAVR with a mean gradient of 8.50mm hg and no PVL. -- Groin sites are stable.  -- Continue aspirin  81mg  daily -- Met with cardiac rehab to discuss CRP phase II.  -- Plan for discharge home today with close follow up in the outpatient setting.   RBBB: -- Pt developed CHB s/p TAVR. RIJ pacer was left in place and sent to ICU.  -- She regained her intrinsic rhythm with no recurrent heart block and monitored on tele for 48 hours.  -- Will  discharge home with a Zio AT to rule out recurrent heart block after discharge.   HfmrEF: -- EF 35-40%.  -- LVEDP 15 mm hg at the time of TAVR. -- Continue Jardiance  10mg  daily. -- Continue torsemide  10mg  and KCl 10 meq daily. -- BP improved. Will start low dose Losartan  12.5 mg daily.  -- Hold off on BB given RBBB with transient CHB after TAVR.  HTN: -- BP improved.   -- Continue torsemide  10mg  and KCl 10 meq daily. -- Start low dose Losartan  12.5 mg daily. -- BMET at follow up.  CAD: -- S/p STEMI with ostial RCA with associate large RV infarct. -- Treated medically. -- Continue aspirin  81mg  daily --  Continue atorvastatin  80mg  daily.  Pulmonary nodule: -- Pre TAVR CTs noted prominent mediastinal nodal tissue which may be reactive and a LUL pulmonary nodule measuring 4 mm. No follow-up needed if patient is low-risk. -- This will be discussed in the outpatient setting.   _____________  Discharge Vitals Blood pressure 118/60, pulse 91, temperature 99 F (37.2 C), temperature source Oral, resp. rate 19, height 4' 9 (1.448 m), weight 70.2 kg, SpO2 97%.  Filed Weights   09/06/24 1030 09/07/24 0421 09/08/24 0600  Weight: 68 kg 75 kg 70.2 kg    Disposition   Pt is being discharged home today in good condition.  Follow-up Plans & Appointments     Follow-up Information     Sebastian Lamarr SAUNDERS, PA-C. Go on 09/15/2024.   Specialties: Cardiology, Radiology Why: @ 1:55pm, please arrive at least 20 minutes early. Contact information: [address] [place] [address] [phone]                  Discharge Medications   Allergies as of 09/08/2024       Reactions   Timolol Shortness Of Breath   Patient does not recall shortness of breath; believes it caused itching/pain in eye.   Codeine Nausea And Vomiting   Morphine And Codeine Nausea And Vomiting   Other Other (See Comments)   vicryl sutures - rash/redness, delay in healing        Medication  List     TAKE these medications    acetaminophen  500 MG tablet Commonly known as: TYLENOL  Take 500 mg by mouth every 6 (six) hours as needed for mild pain or headache.   aspirin  EC 81 MG tablet Take 81 mg by mouth every evening.   atorvastatin  80 MG tablet Commonly known as: LIPITOR  Take 1 tablet (80 mg total) by mouth daily.   B-complex with vitamin C tablet Take 1 tablet by mouth daily.   Beet Root 500 MG Caps Take 500 mg by mouth 3 (three) times daily.   BORON PO Take 1 tablet by mouth daily.   CAL-MAG-ZINC PO Take 1 tablet by mouth 3 (three) times daily.   cetirizine 10 MG tablet Commonly known as: ZYRTEC Take 10 mg by mouth daily.   Co Q-10 200 MG Caps Take 200 mg by mouth daily.   DHEA 25 MG tablet Take 25 mg by mouth daily.   DRY EYE RELIEF OP Place 1 drop into both eyes 4 (four) times daily as needed (dryness).   empagliflozin  10 MG Tabs tablet Commonly known as: Jardiance  Take 1 tablet (10 mg total) by mouth daily. PLEASE SCHEDULE APPOINTMENT FOR MORE REFILLS   FISH OIL PO Take 1 capsule by mouth 2 (two) times daily.   Glucosamine-Chondroitin 750-600 MG Tabs Take 1 tablet by mouth 2 (two) times daily.   GOLD BOND INTENSIVE HEALING EX Apply 1 application  topically daily as needed (eczema).   losartan  25 MG tablet Commonly known as: Cozaar  Take 0.5 tablets (12.5 mg total) by mouth daily.   meclizine  25 MG tablet Commonly known as: ANTIVERT  Take 25 mg by mouth daily.   MSM 1000 MG Caps Take 1,000 mg by mouth daily.   OVER THE COUNTER MEDICATION Take 1 capsule by mouth daily. Sea Kelp   OVER THE COUNTER MEDICATION Place 1 Application into both nostrils daily as needed (congestion). Vicks Nasal Sticks   potassium chloride  10 MEQ tablet Commonly known as: KLOR-CON  Take 1 tablet (10 mEq total) by mouth 3 (three) times  a week. What changed:  how much to take when to take this   torsemide  20 MG tablet Commonly known as: DEMADEX  Take 0.5  tablets (10 mg total) by mouth daily.   Vitamin D3 10 MCG (400 UNIT) tablet Take 400 Units by mouth 2 (two) times daily.   vitamin E 180 MG (400 UNITS) capsule Take 400 Units by mouth daily.         Outstanding Labs/Studies   BMET  ______________________  Duration of Discharge Encounter: APP Time: 35 minutes    Signed, Lamarr Hummer, PA-C 09/08/2024, 9:41 AM [phone]

## 2024-09-06 NOTE — Progress Notes (Signed)
   [address] [place] [address]             [phone]   Resting comfortably  BP 132/80   Pulse 74   Temp 97.6 F (36.4 C) (Oral)   Resp (!) 25   Ht 4' 9 (1.448 m)   Wt 68 kg   SpO2 97%   BMI 32.46 kg/m    Intake/Output Summary (Last 24 hours) at 09/06/2024 1843 Last data filed at 09/06/2024 1836 Gross per 24 hour  Intake 1105.04 ml  Output 400 ml  Net 705.04 ml   In SR with occasional paced beat since arrival to unit  [place] C. Kerrin, MD [facility] [phone]

## 2024-09-06 NOTE — Op Note (Signed)
 HEART AND VASCULAR CENTER  TAVR OPERATIVE NOTE   Date of Procedure:  09/06/2024  Preoperative Diagnosis: Severe Aortic Stenosis   Postoperative Diagnosis: Same and complete heart block  Procedure:   Transcatheter Aortic Valve Replacement - Transfemoral Approach  Edwards Sapien 3 Resilia THV (size 23 mm, model # 9755RLS,)   Co-Surgeons:  [name], MD and Lurena Red, MD Anesthesiologist:  Keneth  Echocardiographer:  Croitoru  Pre-operative Echo Findings: Severe aortic stenosis Moderate left ventricular systolic dysfunction  Post-operative Echo Findings: No paravalvular leak Moderate left ventricular systolic dysfunction  Left Heart Catheterization Findings: Left ventricular end-diastolic pressure of 15 mmHg   BRIEF CLINICAL NOTE AND INDICATIONS FOR SURGERY  The patient is a 78 year old female with a history of hypertension, hyperlipidemia, prediabetes, coronary artery disease status post inferior ST elevation myocardial infarction 2023, right bundle branch block, severe TR, and paradoxical low-flow low gradient aortic stenosis who is referred for elective transcatheter aortic valve replacement with a 23 mm SAPIEN 3 valve from the right transfemoral approach with planned right internal jugular pacing.  During the course of the patient's preoperative work up they have been evaluated comprehensively by a multidisciplinary team of specialists coordinated through the Multidisciplinary Heart Valve Clinic in the [facility].  They have been demonstrated to suffer from symptomatic severe aortic stenosis as noted above. The patient has been counseled extensively as to the relative risks and benefits of all options for the treatment of severe aortic stenosis including long term medical therapy, conventional surgery for aortic valve replacement, and transcatheter aortic valve replacement.  The patient has been independently evaluated by Dr. Fellers with CT  surgery and they are felt to be at high risk for conventional surgical aortic valve replacement. The surgeon indicated the patient would be a poor candidate for conventional surgery. Based upon review of all of the patient's preoperative diagnostic tests they are felt to be candidate for transcatheter aortic valve replacement using the transfemoral approach as an alternative to high risk conventional surgery.    Following the decision to proceed with transcatheter aortic valve replacement, a discussion has been held regarding what types of management strategies would be attempted intraoperatively in the event of life-threatening complications, including whether or not the patient would be considered a candidate for the use of cardiopulmonary bypass and/or conversion to open sternotomy for attempted surgical intervention.  The patient has been advised of a variety of complications that might develop peculiar to this approach including but not limited to risks of death, stroke, paravalvular leak, aortic dissection or other major vascular complications, aortic annulus rupture, device embolization, cardiac rupture or perforation, acute myocardial infarction, arrhythmia, heart block or bradycardia requiring permanent pacemaker placement, congestive heart failure, respiratory failure, renal failure, pneumonia, infection, other late complications related to structural valve deterioration or migration, or other complications that might ultimately cause a temporary or permanent loss of functional independence or other long term morbidity.  The patient provides full informed consent for the procedure as described and all questions were answered preoperatively.    DETAILS OF THE OPERATIVE PROCEDURE  PREPARATION:   The patient is brought to the operating room on the above mentioned date and central monitoring was established by the anesthesia team. The patient is placed in the supine position on the operating table.   Intravenous antibiotics are administered. Conscious sedation is used.   Baseline transthoracic echocardiogram was performed. The patient's chest, abdomen, both groins, and both lower extremities are prepared and draped in  a sterile manner. A time out procedure is performed.   PERIPHERAL ACCESS:   Using the modified Seldinger technique, femoral arterial and venous access were obtained with placement of a 6 Fr sheath in the left common femoral artery and a 6 Fr sheath in the internal jugular vein using u/s guidance.  A pigtail diagnostic catheter was passed through the femoral arterial sheath under fluoroscopic guidance into the aortic root.  A temporary transvenous pacemaker catheter was passed through the femoral venous sheath under fluoroscopic guidance into the right ventricle.  The pacemaker was tested to ensure stable lead placement and pacemaker capture. Aortic root angiography was performed in order to determine the optimal angiographic angle for valve deployment.  TRANSFEMORAL ACCESS:  A micropuncture kit was used to gain access to the right common femoral artery using u/s guidance. Position confirmed with angiography. Pre-closure with double ProGlide closure devices. The patient was heparinized systemically and ACT verified > 250 seconds.    A 14 Fr transfemoral E-sheath was introduced into the right common femoral artery after progressively dilating over an Amplatz superstiff wire. An AL-1 catheter was used to direct a straight-tip exchange length wire across the native aortic valve into the left ventricle. This was exchanged out for a pigtail catheter and position was confirmed in the LV apex. Simultaneous left ventricular, aortic, and left ventricular end-diastolic pressures were recorded.  The pigtail catheter was then exchanged for an Safari wire in the LV apex.    TRANSCATHETER HEART VALVE DEPLOYMENT:  An Edwards Sapien 3 THV (size 23 mm) was prepared and crimped per manufacturer's  guidelines, and the proper orientation of the valve is confirmed on the [employer] delivery system. The valve was advanced through the introducer sheath using normal technique until in an appropriate position in the abdominal aorta beyond the sheath tip. The balloon was then retracted and using the fine-tuning wheel was centered on the valve. The valve was then advanced across the aortic arch using appropriate flexion of the catheter. The valve was carefully positioned across the aortic valve annulus. The Commander catheter was retracted using normal technique. Once final position of the valve has been confirmed by angiographic assessment, the valve is deployed while temporarily holding ventilation and during rapid ventricular pacing to maintain systolic blood pressure < 50 mmHg and pulse pressure < 10 mmHg. The balloon inflation is held for >3 seconds after reaching full deployment volume. Once the balloon has fully deflated the balloon is retracted into the ascending aorta and valve function is assessed using TTE. There is felt to be no paravalvular leak and no central aortic insufficiency.  The patient's hemodynamic recovery following valve deployment is good.  The deployment balloon and guidewire are both removed. Echo demostrated acceptable post-procedural gradients, stable mitral valve function, and no AI.   PROCEDURE COMPLETION:  The sheath was then removed and closure devices were completed. Protamine was administered once femoral arterial repair was complete. The temporary pacemaker, pigtail catheters and femoral sheaths were removed with a Mynx closure device placed in the artery and manual pressure used for venous hemostasis.    The patient tolerated the procedure well and is transported to the surgical intensive care in stable condition. There were no immediate intraoperative complications. All sponge instrument and needle counts are verified correct at completion of the operation.   No  blood products were administered during the operation.  The patient received a total of 60 mL of intravenous contrast during the procedure.  Isahia Hollerbach K Ahleah Simko MD 09/06/2024  1:47 PM

## 2024-09-07 ENCOUNTER — Inpatient Hospital Stay (HOSPITAL_COMMUNITY)

## 2024-09-07 ENCOUNTER — Telehealth (HOSPITAL_COMMUNITY): Payer: Self-pay

## 2024-09-07 ENCOUNTER — Encounter (HOSPITAL_COMMUNITY): Payer: Self-pay | Admitting: Internal Medicine

## 2024-09-07 ENCOUNTER — Other Ambulatory Visit (HOSPITAL_COMMUNITY): Payer: Self-pay

## 2024-09-07 DIAGNOSIS — Z952 Presence of prosthetic heart valve: Secondary | ICD-10-CM | POA: Diagnosis not present

## 2024-09-07 LAB — BASIC METABOLIC PANEL WITH GFR
Anion gap: 11 (ref 5–15)
BUN: 16 mg/dL (ref 8–23)
CO2: 24 mmol/L (ref 22–32)
Calcium: 8.4 mg/dL — ABNORMAL LOW (ref 8.9–10.3)
Chloride: 100 mmol/L (ref 98–111)
Creatinine, Ser: 0.82 mg/dL (ref 0.44–1.00)
GFR, Estimated: >60 mL/min (ref >60)
Glucose, Bld: 148 mg/dL — ABNORMAL HIGH (ref 70–99)
Potassium: 4.1 mmol/L (ref 3.5–5.1)
Sodium: 135 mmol/L (ref 135–145)

## 2024-09-07 LAB — CBC
HCT: 38.1 % (ref 36.0–46.0)
Hemoglobin: 13 g/dL (ref 12.0–15.0)
MCH: 32.5 pg (ref 26.0–34.0)
MCHC: 34.1 g/dL (ref 30.0–36.0)
MCV: 95.3 fL (ref 80.0–100.0)
Platelets: 163 K/uL (ref 150–400)
RBC: 4 MIL/uL (ref 3.87–5.11)
RDW: 14.1 % (ref 11.5–15.5)
WBC: 9.8 K/uL (ref 4.0–10.5)
nRBC: 0 % (ref 0.0–0.2)

## 2024-09-07 LAB — ECHOCARDIOGRAM COMPLETE
AR max vel: 1.06 cm2
AV Area VTI: 1.05 cm2
AV Area mean vel: 1.03 cm2
AV Mean grad: 8.5 mmHg
AV Peak grad: 14.1 mmHg
Ao pk vel: 1.88 m/s
Area-P 1/2: 5.27 cm2
Calc EF: 45.3 %
Height: 57 in
MV VTI: 1.98 cm2
S' Lateral: 4.3 cm
Single Plane A2C EF: 44.9 %
Single Plane A4C EF: 47.7 %
Weight: 2645.52 [oz_av]

## 2024-09-07 LAB — MAGNESIUM: Magnesium: 2 mg/dL (ref 1.7–2.4)

## 2024-09-07 MED ORDER — PERFLUTREN LIPID MICROSPHERE: 1.0000 mL | INTRAVENOUS | Status: AC | PRN

## 2024-09-07 NOTE — Progress Notes (Signed)
 Pt just ambulated with RW and staff. Discussed with pt restrictions, walking at home, and CRPII. She voiced reception. Not interested in CRPII, sts she did it before and doesn't need it. [phone] Aliene Aris BS, ACSM-CEP 09/07/2024 11:31 AM

## 2024-09-07 NOTE — Plan of Care (Signed)
  Problem: Health Behavior/Discharge Planning: Goal: Ability to manage health-related needs will improve Outcome: Progressing   Problem: Clinical Measurements: Goal: Diagnostic test results will improve Outcome: Progressing Goal: Cardiovascular complication will be avoided Outcome: Progressing   

## 2024-09-07 NOTE — Progress Notes (Signed)
 1 Day Post-Op Procedure(s) (LRB): Transcatheter Aortic Valve Replacement, Transfemoral (Right) ECHOCARDIOGRAM, TRANSTHORACIC (N/A) Subjective: No complaints.  Monitor reviewed. She has remain in NSR 90's overnight. I do not see any high grade heart block.   Objective: Vital signs in last 24 hours: Temp:  [97.6 F (36.4 C)-98.4 F (36.9 C)] 98.4 F (36.9 C) (10/21 2335) Pulse Rate:  [60-96] 74 (10/21 1400) Cardiac Rhythm: Normal sinus rhythm (10/21 2100) Resp:  [16-34] 18 (10/22 0630) BP: (44-188)/(29-147) 104/58 (10/22 0630) SpO2:  [87 %-100 %] 96 % (10/22 0630) Weight:  [68 kg-75 kg] 75 kg (10/22 0421)  Hemodynamic parameters for last 24 hours:    Intake/Output from previous day: 10/21 0701 - 10/22 0700 In: 1475.4 [I.V.:1175.3; IV Piggyback:300.1] Out: 575 [Urine:525; Blood:50] Intake/Output this shift: No intake/output data recorded.  General appearance: alert and cooperative Neurologic: intact Heart: regular rate and rhythm, S1, S2 normal, no murmur Lungs: clear to auscultation bilaterally Extremities: no edema Wound: groin sites ok  Lab Results: No results for input(s): WBC, HGB, HCT, PLT in the last 72 hours. BMET: No results for input(s): NA, K, CL, CO2, GLUCOSE, BUN, CREATININE, CALCIUM  in the last 72 hours.  PT/INR: No results for input(s): LABPROT, INR in the last 72 hours. ABG    Component Value Date/Time   PHART 7.401 07/29/2024 1431   HCO3 22.4 07/29/2024 1458   TCO2 24 07/29/2024 1458   ACIDBASEDEF 3.0 (H) 07/29/2024 1458   O2SAT 58 07/29/2024 1458   CBG (last 3)  No results for input(s): GLUCAP in the last 72 hours.  Assessment/Plan: S/P Procedure(s) (LRB): Transcatheter Aortic Valve Replacement, Transfemoral (Right) ECHOCARDIOGRAM, TRANSTHORACIC (N/A)  She has been hemodynamically stable in NSR after temporary CHB following wire in LV and valve insertion. Preop RBBB. I removed the pacer this am. Will check ECG  this am. Plan to watch her today on monitor and if no high grade heart block will plan to send home tomorrow with Zio patch.  2D echo this am.  OOB, ambulate.   LOS: 1 day    Amy [name] 09/07/2024

## 2024-09-07 NOTE — Anesthesia Postprocedure Evaluation (Signed)
 Anesthesia Post Note  Patient: CORDELLA NYQUIST  Procedure(s) Performed: Transcatheter Aortic Valve Replacement, Transfemoral (Right) ECHOCARDIOGRAM, TRANSTHORACIC     Patient location during evaluation: PACU Anesthesia Type: MAC Level of consciousness: awake and alert Pain management: pain level controlled Vital Signs Assessment: post-procedure vital signs reviewed and stable Respiratory status: spontaneous breathing, nonlabored ventilation, respiratory function stable and patient connected to nasal cannula oxygen Cardiovascular status: stable and blood pressure returned to baseline Postop Assessment: no apparent nausea or vomiting Anesthetic complications: no   There were no known notable events for this encounter.  Last Vitals:  Vitals:   09/07/24 1115 09/07/24 1146  BP:    Pulse:    Resp: (!) 28   Temp:  37 C  SpO2: 97%     Last Pain:  Vitals:   09/07/24 1146  TempSrc: Oral  PainSc:                  [place] MARLA Cornea

## 2024-09-07 NOTE — Progress Notes (Signed)
  Echocardiogram 2D Echocardiogram has been performed.  Norleen ORN [facility] 09/07/2024, 9:40 AM

## 2024-09-07 NOTE — Telephone Encounter (Signed)
 Pharmacy Patient Advocate Encounter  Insurance verification completed.    The patient is insured through [employer]. Patient has Medicare and is not eligible for a copay card, but may be able to apply for patient assistance or Medicare RX Payment Plan (Patient Must reach out to their plan, if eligible for payment plan), if available.    Ran test claim for Entresto  24-26mg  and the current 30 day co-pay is $4.90.   This test claim was processed through [facility]- copay amounts may vary at other pharmacies due to pharmacy/plan contracts, or as the patient moves through the different stages of their insurance plan.

## 2024-09-08 ENCOUNTER — Inpatient Hospital Stay (HOSPITAL_COMMUNITY)
Admission: RE | Admit: 2024-09-08 | Discharge: 2024-09-08 | Disposition: A | Discharge status: Home / Self Care | Source: Home / Self Care | Attending: Physician Assistant | Admitting: Physician Assistant

## 2024-09-08 DIAGNOSIS — R008 Other abnormalities of heart beat: Secondary | ICD-10-CM

## 2024-09-08 DIAGNOSIS — Z952 Presence of prosthetic heart valve: Secondary | ICD-10-CM | POA: Diagnosis not present

## 2024-09-08 DIAGNOSIS — I4510 Unspecified right bundle-branch block: Secondary | ICD-10-CM

## 2024-09-08 DIAGNOSIS — I442 Atrioventricular block, complete: Secondary | ICD-10-CM

## 2024-09-08 DIAGNOSIS — I493 Ventricular premature depolarization: Secondary | ICD-10-CM

## 2024-09-08 LAB — CBC
HCT: 36.5 % (ref 36.0–46.0)
Hemoglobin: 12.5 g/dL (ref 12.0–15.0)
MCH: 32.6 pg (ref 26.0–34.0)
MCHC: 34.2 g/dL (ref 30.0–36.0)
MCV: 95.1 fL (ref 80.0–100.0)
Platelets: 133 K/uL — ABNORMAL LOW (ref 150–400)
RBC: 3.84 MIL/uL — ABNORMAL LOW (ref 3.87–5.11)
RDW: 14 % (ref 11.5–15.5)
WBC: 9.4 K/uL (ref 4.0–10.5)
nRBC: 0 % (ref 0.0–0.2)

## 2024-09-08 LAB — BASIC METABOLIC PANEL WITH GFR
Anion gap: 8 (ref 5–15)
BUN: 23 mg/dL (ref 8–23)
CO2: 21 mmol/L — ABNORMAL LOW (ref 22–32)
Calcium: 7.9 mg/dL — ABNORMAL LOW (ref 8.9–10.3)
Chloride: 103 mmol/L (ref 98–111)
Creatinine, Ser: 0.9 mg/dL (ref 0.44–1.00)
GFR, Estimated: >60 mL/min (ref >60)
Glucose, Bld: 100 mg/dL — ABNORMAL HIGH (ref 70–99)
Potassium: 4.4 mmol/L (ref 3.5–5.1)
Sodium: 132 mmol/L — ABNORMAL LOW (ref 135–145)

## 2024-09-08 MED ORDER — LOSARTAN POTASSIUM 25 MG PO TABS: 12.5000 mg | ORAL_TABLET | Freq: Every day | ORAL | 11 refills | Status: DC

## 2024-09-08 NOTE — Progress Notes (Signed)
 Heart Failure Navigator Progress Note  Assessed for Heart & Vascular TOC clinic readiness.  Patient does not meet criteria due to already established with AHF team - patient of Dr. Bensimhon.   Will sign off.  Duwaine Plant, PharmD, BCPS Heart Failure Stewardship Pharmacist Phone [phone]

## 2024-09-08 NOTE — Progress Notes (Signed)
 2 Days Post-Op Procedure(s) (LRB): Transcatheter Aortic Valve Replacement, Transfemoral (Right) ECHOCARDIOGRAM, TRANSTHORACIC (N/A) Subjective:  No complaints. Slept well. Ambulated yesterday.  Monitor reviewed and she has been in NSR with some PVC's and bigeminy but no advanced heart block. ECG yesterday showed her preexisting RBBB.  Objective: Vital signs in last 24 hours: Temp:  [97.6 F (36.4 C)-100.3 F (37.9 C)] 98.2 F (36.8 C) (10/23 0442) Pulse Rate:  [86-110] 110 (10/23 0600) Cardiac Rhythm: Normal sinus rhythm;Bundle branch block (10/22 1908) Resp:  [18-39] 20 (10/23 0607) BP: (82-130)/(43-110) 118/61 (10/23 0442) SpO2:  [89 %-99 %] 96 % (10/23 0442) Weight:  [70.2 kg] 70.2 kg (10/23 0600)  Hemodynamic parameters for last 24 hours:    Intake/Output from previous day: 10/22 0701 - 10/23 0700 In: 1060 [P.O.:1060] Out: 200 [Urine:200] Intake/Output this shift: No intake/output data recorded.  General appearance: alert and cooperative Neurologic: intact Heart: regular rate and rhythm, S1, S2 normal, no murmur Lungs: clear to auscultation bilaterally Extremities: no edema Wound: groin sites ok  Lab Results: Recent Labs    09/07/24 0755 09/08/24 0303  WBC 9.8 9.4  HGB 13.0 12.5  HCT 38.1 36.5  PLT 163 133*   BMET:  Recent Labs    09/07/24 0755 09/08/24 0303  NA 135 132*  K 4.1 4.4  CL 100 103  CO2 24 21*  GLUCOSE 148* 100*  BUN 16 23  CREATININE 0.82 0.90  CALCIUM  8.4* 7.9*    PT/INR: No results for input(s): LABPROT, INR in the last 72 hours. ABG    Component Value Date/Time   PHART 7.401 07/29/2024 1431   HCO3 22.4 07/29/2024 1458   TCO2 24 07/29/2024 1458   ACIDBASEDEF 3.0 (H) 07/29/2024 1458   O2SAT 58 07/29/2024 1458   CBG (last 3)  No results for input(s): GLUCAP in the last 72 hours.  Assessment/Plan: S/P Procedure(s) (LRB): Transcatheter Aortic Valve Replacement, Transfemoral (Right) ECHOCARDIOGRAM, TRANSTHORACIC  (N/A)  POD 2  Hemodynamically stable in NSR. Transient CHB after valve insertion that resolved. Echo looks fine. Rhythm has been stable in sinus. I think she could go home with a Zio patch monitor. She lives with her daughter who is in the hospital at this time and has no other family around this area. Her daughter had a complicated knee replacement and pt was living alone for 3 weeks preop.   LOS: 2 days    Dorise MARLA Fellers 09/08/2024

## 2024-09-08 NOTE — Progress Notes (Signed)
   09/08/24 1306  TOC Brief Assessment  Insurance and Status Reviewed  Patient has primary care physician Yes  Home environment has been reviewed home  Prior level of function: self w/ AD  Prior/Current Home Services No current home services  Social Drivers of Health Review SDOH reviewed no interventions necessary  Readmission risk has been reviewed Yes  Transition of care needs no transition of care needs at this time    Pt s/p TAVR, no HH or DME needs noted. Family to transport home

## 2024-09-08 NOTE — Progress Notes (Signed)
 Removed Patients right jugular introducer per order. Line intact upon removal. Patient tolerated well. Pressure held for 5 minutes and petroleum dressing placed and intact.

## 2024-09-09 ENCOUNTER — Telehealth: Payer: Self-pay | Admitting: Physician Assistant

## 2024-09-09 DIAGNOSIS — I442 Atrioventricular block, complete: Secondary | ICD-10-CM | POA: Diagnosis not present

## 2024-09-09 DIAGNOSIS — I4510 Unspecified right bundle-branch block: Secondary | ICD-10-CM | POA: Diagnosis not present

## 2024-09-09 NOTE — Telephone Encounter (Signed)
 Attempted TOC call. No answer. Will try again Monday.

## 2024-09-10 ENCOUNTER — Other Ambulatory Visit: Payer: Self-pay

## 2024-09-10 ENCOUNTER — Emergency Department (HOSPITAL_COMMUNITY)

## 2024-09-10 ENCOUNTER — Inpatient Hospital Stay (HOSPITAL_COMMUNITY)
Admission: EM | Admit: 2024-09-10 | Discharge: 2024-09-13 | DRG: 243 | Disposition: A | Discharge status: Home Health | Attending: Cardiology | Admitting: Cardiology

## 2024-09-10 ENCOUNTER — Telehealth: Payer: Self-pay

## 2024-09-10 DIAGNOSIS — I442 Atrioventricular block, complete: Secondary | ICD-10-CM | POA: Diagnosis present

## 2024-09-10 DIAGNOSIS — Z7984 Long term (current) use of oral hypoglycemic drugs: Secondary | ICD-10-CM | POA: Diagnosis not present

## 2024-09-10 DIAGNOSIS — Z79899 Other long term (current) drug therapy: Secondary | ICD-10-CM

## 2024-09-10 DIAGNOSIS — I2510 Atherosclerotic heart disease of native coronary artery without angina pectoris: Secondary | ICD-10-CM | POA: Diagnosis present

## 2024-09-10 DIAGNOSIS — I499 Cardiac arrhythmia, unspecified: Secondary | ICD-10-CM | POA: Diagnosis not present

## 2024-09-10 DIAGNOSIS — R001 Bradycardia, unspecified: Secondary | ICD-10-CM | POA: Diagnosis not present

## 2024-09-10 DIAGNOSIS — I428 Other cardiomyopathies: Secondary | ICD-10-CM | POA: Diagnosis present

## 2024-09-10 DIAGNOSIS — I495 Sick sinus syndrome: Secondary | ICD-10-CM | POA: Diagnosis not present

## 2024-09-10 DIAGNOSIS — E860 Dehydration: Secondary | ICD-10-CM | POA: Diagnosis present

## 2024-09-10 DIAGNOSIS — Z96653 Presence of artificial knee joint, bilateral: Secondary | ICD-10-CM | POA: Diagnosis present

## 2024-09-10 DIAGNOSIS — Z950 Presence of cardiac pacemaker: Secondary | ICD-10-CM | POA: Diagnosis not present

## 2024-09-10 DIAGNOSIS — I517 Cardiomegaly: Secondary | ICD-10-CM | POA: Diagnosis not present

## 2024-09-10 DIAGNOSIS — R531 Weakness: Secondary | ICD-10-CM | POA: Diagnosis not present

## 2024-09-10 DIAGNOSIS — Z885 Allergy status to narcotic agent status: Secondary | ICD-10-CM | POA: Diagnosis not present

## 2024-09-10 DIAGNOSIS — I110 Hypertensive heart disease with heart failure: Secondary | ICD-10-CM | POA: Diagnosis present

## 2024-09-10 DIAGNOSIS — Z888 Allergy status to other drugs, medicaments and biological substances status: Secondary | ICD-10-CM | POA: Diagnosis not present

## 2024-09-10 DIAGNOSIS — R7401 Elevation of levels of liver transaminase levels: Secondary | ICD-10-CM | POA: Diagnosis present

## 2024-09-10 DIAGNOSIS — Z7982 Long term (current) use of aspirin: Secondary | ICD-10-CM | POA: Diagnosis not present

## 2024-09-10 DIAGNOSIS — I252 Old myocardial infarction: Secondary | ICD-10-CM | POA: Diagnosis not present

## 2024-09-10 DIAGNOSIS — R7303 Prediabetes: Secondary | ICD-10-CM | POA: Diagnosis present

## 2024-09-10 DIAGNOSIS — I5A Non-ischemic myocardial injury (non-traumatic): Secondary | ICD-10-CM | POA: Diagnosis present

## 2024-09-10 DIAGNOSIS — I071 Rheumatic tricuspid insufficiency: Secondary | ICD-10-CM | POA: Diagnosis present

## 2024-09-10 DIAGNOSIS — R42 Dizziness and giddiness: Secondary | ICD-10-CM | POA: Diagnosis present

## 2024-09-10 DIAGNOSIS — I455 Other specified heart block: Secondary | ICD-10-CM | POA: Diagnosis present

## 2024-09-10 DIAGNOSIS — Z95810 Presence of automatic (implantable) cardiac defibrillator: Secondary | ICD-10-CM | POA: Diagnosis not present

## 2024-09-10 DIAGNOSIS — E66811 Obesity, class 1: Secondary | ICD-10-CM | POA: Diagnosis present

## 2024-09-10 DIAGNOSIS — Z953 Presence of xenogenic heart valve: Secondary | ICD-10-CM | POA: Diagnosis not present

## 2024-09-10 DIAGNOSIS — I5022 Chronic systolic (congestive) heart failure: Secondary | ICD-10-CM | POA: Diagnosis present

## 2024-09-10 DIAGNOSIS — I272 Pulmonary hypertension, unspecified: Secondary | ICD-10-CM | POA: Diagnosis present

## 2024-09-10 DIAGNOSIS — E785 Hyperlipidemia, unspecified: Secondary | ICD-10-CM | POA: Diagnosis present

## 2024-09-10 DIAGNOSIS — I4510 Unspecified right bundle-branch block: Secondary | ICD-10-CM | POA: Diagnosis present

## 2024-09-10 DIAGNOSIS — Z6832 Body mass index (BMI) 32.0-32.9, adult: Secondary | ICD-10-CM

## 2024-09-10 DIAGNOSIS — R55 Syncope and collapse: Secondary | ICD-10-CM | POA: Diagnosis not present

## 2024-09-10 LAB — CBC
HCT: 38.4 % (ref 36.0–46.0)
Hemoglobin: 12.8 g/dL (ref 12.0–15.0)
MCH: 31.7 pg (ref 26.0–34.0)
MCHC: 33.3 g/dL (ref 30.0–36.0)
MCV: 95 fL (ref 80.0–100.0)
Platelets: 183 K/uL (ref 150–400)
RBC: 4.04 MIL/uL (ref 3.87–5.11)
RDW: 14 % (ref 11.5–15.5)
WBC: 8.8 K/uL (ref 4.0–10.5)
nRBC: 0 % (ref 0.0–0.2)

## 2024-09-10 LAB — CBG MONITORING, ED: Glucose-Capillary: 91 mg/dL (ref 70–99)

## 2024-09-10 MED ORDER — SODIUM CHLORIDE 0.9 % IV BOLUS
250.0000 mL | Freq: Once | INTRAVENOUS | Status: DC
Start: 2024-09-10 — End: 2024-09-11

## 2024-09-10 NOTE — Telephone Encounter (Signed)
 I received a page from [facility] stating that the patient had a 6-second pause on her monitor.  I managed to reach the patient and she said that she has been feeling intermittently dizzy lately, more so today.  I instructed her to call 911 and go to the emergency room.  The patient said she will do so.

## 2024-09-10 NOTE — ED Provider Notes (Signed)
 [place] EMERGENCY DEPARTMENT AT [facility] Provider Note   CSN: [id number] Arrival date & time: 09/10/24  2051     Patient presents with: Dizziness   Amy [name] is a 78 y.o. 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  Patient reports after her valve replacement on October 21 a heart monitor was placed.  Today patient had an episode of dizziness and was later called by the Zio patch and told she had a 6-second pause and was recommended to present to the Emergency Department.  At the time she denies chest pain, shortness of breath, fevers, or other sick symptoms.  {Add pertinent medical, surgical, social history, OB history to HPI:32947}  Dizziness Associated symptoms: no chest pain and no shortness of breath        Prior to Admission medications   Medication Sig Start Date End Date Taking? Authorizing Provider  acetaminophen  (TYLENOL ) 500 MG tablet Take 500 mg by mouth every 6 (six) hours as needed for mild pain or headache.    [provider]  aspirin  81 MG EC tablet Take 81 mg by mouth every evening.    [provider]  atorvastatin  (LIPITOR ) 80 MG tablet Take 1 tablet (80 mg total) by mouth daily. 10/24/21   Bryn Bernardino NOVAK, MD  B Complex-C (B-COMPLEX WITH VITAMIN C) tablet Take 1 tablet by mouth daily. 11/21/15   [provider]  BORON PO Take 1 tablet by mouth daily.    [provider]  Calcium -Magnesium-Zinc (CAL-MAG-ZINC PO) Take 1 tablet by mouth 3 (three) times daily.    [provider]  Carboxymethylcellulose Sodium (DRY EYE RELIEF OP) Place 1 drop into both eyes 4 (four) times daily as needed (dryness).    [provider]  cetirizine (ZYRTEC) 10 MG tablet Take 10 mg by mouth daily.    [provider]  Cholecalciferol (VITAMIN D3) 10 MCG (400 UNIT) tablet Take 400 Units by mouth 2 (two) times daily.     [provider]  Coenzyme Q10 (CO Q-10) 200 MG CAPS Take 200 mg by mouth daily.    [provider]  DHEA 25 MG tablet Take 25 mg by mouth daily.    [provider]  empagliflozin  (JARDIANCE ) 10 MG TABS tablet Take 1 tablet (10 mg total) by mouth daily. PLEASE SCHEDULE APPOINTMENT FOR MORE REFILLS Patient not taking: Reported on 09/07/2024 06/16/24   Colletta Manuelita Garre, PA-C  Glucosamine-Chondroitin 750-600 MG TABS Take 1 tablet by mouth 2 (two) times daily.    [provider]  losartan  (COZAAR ) 25 MG tablet Take 0.5 tablets (12.5 mg total) by mouth daily. 09/08/24 09/08/25  Sebastian Lamarr SAUNDERS, PA-C  meclizine  (ANTIVERT ) 25 MG tablet Take 25 mg by mouth daily. 09/12/21   [provider]  Methylsulfonylmethane (MSM) 1000 MG CAPS Take 1,000 mg by mouth daily.    [provider]  Misc Natural Products (BEET ROOT) 500 MG CAPS Take 500 mg by mouth 3 (three) times daily.    [provider]  Omega-3 Fatty Acids (FISH OIL PO) Take 1 capsule by mouth 2 (two) times daily.    [provider]  OVER THE COUNTER MEDICATION Take 1 capsule by mouth daily. [employer] Engineer, Historical, MD  OVER THE COUNTER MEDICATION Place 1 Application into both nostrils daily as needed (congestion). Vicks Nasal Sticks    [provider]  potassium chloride  (KLOR-CON ) 10 MEQ tablet Take 1 tablet (10 mEq total) by mouth 3 (three) times a week. Patient taking differently: Take 5 mEq by mouth daily. 10/30/23   Lee, Jordan, NP  Pramoxine-Dimethicone (GOLD BOND INTENSIVE HEALING EX) Apply 1 application  topically daily as needed (eczema).    [provider]  torsemide  (DEMADEX ) 20 MG tablet Take 0.5 tablets (10 mg total) by mouth daily. 07/29/24   Thukkani, Arun K, MD  vitamin E 180 MG (400 UNITS) capsule Take 400 Units by mouth daily.    [provider]    Allergies: Timolol, Codeine, Morphine and codeine, and Other    Review of  Systems  Respiratory:  Negative for shortness of breath.   Cardiovascular:  Negative for chest pain.  Neurological:  Positive for dizziness.  All other systems reviewed and are negative.   Updated Vital Signs BP (!) 117/59   Pulse 83   Temp 98.6 F (37 C) (Oral)   Resp 15   Ht 4' 9 (1.448 m)   Wt 68 kg   SpO2 100%   BMI 32.46 kg/m   Physical Exam Vitals and nursing note reviewed.  Constitutional:      General: She is not in acute distress.    Appearance: She is well-developed.  HENT:     Head: Normocephalic and atraumatic.  Eyes:     Conjunctiva/sclera: Conjunctivae normal.  Cardiovascular:     Rate and Rhythm: Normal rate and regular rhythm.     Heart sounds: No murmur heard. Pulmonary:     Effort: Pulmonary effort is normal. No respiratory distress.     Breath sounds: Normal breath sounds.  Abdominal:     Palpations: Abdomen is soft.     Tenderness: There is no abdominal tenderness.  Musculoskeletal:        General: No swelling.     Cervical back: Neck supple.  Skin:    General: Skin is warm and dry.     Capillary Refill: Capillary refill takes less than 2 seconds.  Neurological:     Mental Status: She is alert.     (all labs ordered are listed, but only abnormal results are displayed) Labs Reviewed  COMPREHENSIVE METABOLIC PANEL WITH GFR  CBC  URINALYSIS, ROUTINE W REFLEX MICROSCOPIC  CBG MONITORING, ED    EKG: None  Radiology: No results found.  {Document cardiac monitor, telemetry assessment procedure when appropriate:32947} Procedures   Medications Ordered in the ED - No data to display  Clinical Course as of 09/10/24 2137  Sat Sep 10, 2024  2135 Labs then cards consult - needs admission [LB]    Clinical Course User Index [LB] Sharlet Dowdy, MD   {Click here for ABCD2, HEART and other calculators REFRESH Note before signing:1}                              Medical Decision Making Patient presents to the emergency department by  recommendation of cardiology after her 6-second pause on her Zio patch.  Patient endorsed an episode of dizziness earlier today.  On evaluation, she is hemodynamically stable and in no acute distress.  She denies chest pain, shortness of breath, or dizziness at this time.  She is well-appearing.  Patient recently had a TAVR on 10/21.  Differential diagnosis including but not limited to sick sinus syndrome, arrhythmia, postop conduction abnormality  Cardiology was consulted early patient's clinical course given the incident patient into the  emergency department.  They recommended medical admission for close telemetry monitoring and to be evaluated by the structural heart team in the morning.    Amount and/or Complexity of Data Reviewed Labs: ordered. Radiology: ordered.   ***  {Document critical care time when appropriate  Document review of labs and clinical decision tools ie CHADS2VASC2, etc  Document your independent review of radiology images and any outside records  Document your discussion with family members, caretakers and with consultants  Document social determinants of health affecting pt's care  Document your decision making why or why not admission, treatments were needed:32947:::1}   Final diagnoses:  None    ED Discharge Orders     None

## 2024-09-10 NOTE — ED Provider Notes (Signed)
  Physical Exam  BP 123/69   Pulse 91   Temp 98.6 F (37 C) (Oral)   Resp (!) 28   Ht 4' 9 (1.448 m)   Wt 68 kg   SpO2 98%   BMI 32.46 kg/m   Physical Exam  Procedures  Procedures  ED Course / MDM   Clinical Course as of 09/11/24 0130  Sat Sep 10, 2024  2135 Labs then cards consult - needs admission [LB]  Sun Sep 11, 2024  0125 Consult to Dr. Segars, who is amenable to plan to admit this patient. I appreciate his collaboration in the care of this patient.  [RS]    Clinical Course User Index [LB] Sharlet Dowdy, MD [RS] Bobette Pleasant SAUNDERS, PA-C   Medical Decision Making Amount and/or Complexity of Data Reviewed Labs: ordered. Radiology: ordered.  Risk Decision regarding hospitalization.    Care of the patient is in brief and ED provider Dr. Sharlet; please see her associated note for further insight and 50 course.  In brief patient is 78 year old female with CAD, heart failure, recent TAVR.  Stenosis who presents to the ED at recommendation by cardiology via phone for 6-second pause noted on her cardiac monitoring outpatient today.  She is asymptomatic at this time.  New transaminitis on her labs today with AST/ALT 104/97, mildly elevated troponin to 31.  Plan at time of shift change is for admission to hospital medicine so that the structural cardiology team can see the patient in the morning.  Awaiting hospitalist call.  Admitted as above, Kariel voiced understanding of her medical evaluation and treatment plan. Each of their questions answered to their expressed satisfaction.   This chart was dictated using voice recognition software, Dragon. Despite the best efforts of this provider to proofread and correct errors, errors may still occur which can change documentation meaning.         Bobette Pleasant SAUNDERS, PA-C 09/11/24 0132    Emil Share, DO 09/11/24 [phone]

## 2024-09-10 NOTE — ED Triage Notes (Signed)
 Pt BIB [facility] from home c/o episodes of dizziness. Pt had a valve replacement on 10/21 and a heart monitor was placed. Pt was called and told to come to hospital due to 6 second pause of asystole on heart monitor reading. Pt denies current dizziness, chest pain, and SOB.  170/100 HR 90 100% RA

## 2024-09-11 ENCOUNTER — Inpatient Hospital Stay (HOSPITAL_COMMUNITY)

## 2024-09-11 ENCOUNTER — Encounter (HOSPITAL_COMMUNITY): Payer: Self-pay | Admitting: Internal Medicine

## 2024-09-11 ENCOUNTER — Encounter (HOSPITAL_COMMUNITY)
Admission: EM | Disposition: A | Discharge status: Home Health | Payer: Self-pay | Source: Home / Self Care | Attending: Cardiology

## 2024-09-11 DIAGNOSIS — I455 Other specified heart block: Secondary | ICD-10-CM | POA: Diagnosis present

## 2024-09-11 DIAGNOSIS — I5A Non-ischemic myocardial injury (non-traumatic): Secondary | ICD-10-CM | POA: Diagnosis present

## 2024-09-11 DIAGNOSIS — I442 Atrioventricular block, complete: Secondary | ICD-10-CM | POA: Diagnosis present

## 2024-09-11 DIAGNOSIS — E785 Hyperlipidemia, unspecified: Secondary | ICD-10-CM | POA: Diagnosis present

## 2024-09-11 DIAGNOSIS — Z6832 Body mass index (BMI) 32.0-32.9, adult: Secondary | ICD-10-CM | POA: Diagnosis not present

## 2024-09-11 DIAGNOSIS — R001 Bradycardia, unspecified: Secondary | ICD-10-CM | POA: Diagnosis present

## 2024-09-11 DIAGNOSIS — I5022 Chronic systolic (congestive) heart failure: Secondary | ICD-10-CM | POA: Diagnosis present

## 2024-09-11 DIAGNOSIS — Z885 Allergy status to narcotic agent status: Secondary | ICD-10-CM | POA: Diagnosis not present

## 2024-09-11 DIAGNOSIS — Z888 Allergy status to other drugs, medicaments and biological substances status: Secondary | ICD-10-CM | POA: Diagnosis not present

## 2024-09-11 DIAGNOSIS — Z7984 Long term (current) use of oral hypoglycemic drugs: Secondary | ICD-10-CM | POA: Diagnosis not present

## 2024-09-11 DIAGNOSIS — Z79899 Other long term (current) drug therapy: Secondary | ICD-10-CM | POA: Diagnosis not present

## 2024-09-11 DIAGNOSIS — R42 Dizziness and giddiness: Secondary | ICD-10-CM | POA: Diagnosis present

## 2024-09-11 DIAGNOSIS — Z96653 Presence of artificial knee joint, bilateral: Secondary | ICD-10-CM | POA: Diagnosis present

## 2024-09-11 DIAGNOSIS — I252 Old myocardial infarction: Secondary | ICD-10-CM | POA: Diagnosis not present

## 2024-09-11 DIAGNOSIS — Z953 Presence of xenogenic heart valve: Secondary | ICD-10-CM | POA: Diagnosis not present

## 2024-09-11 DIAGNOSIS — R55 Syncope and collapse: Secondary | ICD-10-CM | POA: Diagnosis not present

## 2024-09-11 DIAGNOSIS — Z950 Presence of cardiac pacemaker: Secondary | ICD-10-CM | POA: Diagnosis not present

## 2024-09-11 DIAGNOSIS — I110 Hypertensive heart disease with heart failure: Secondary | ICD-10-CM | POA: Diagnosis present

## 2024-09-11 DIAGNOSIS — Z7982 Long term (current) use of aspirin: Secondary | ICD-10-CM | POA: Diagnosis not present

## 2024-09-11 DIAGNOSIS — I272 Pulmonary hypertension, unspecified: Secondary | ICD-10-CM | POA: Diagnosis present

## 2024-09-11 DIAGNOSIS — I2510 Atherosclerotic heart disease of native coronary artery without angina pectoris: Secondary | ICD-10-CM | POA: Diagnosis present

## 2024-09-11 DIAGNOSIS — I428 Other cardiomyopathies: Secondary | ICD-10-CM | POA: Diagnosis present

## 2024-09-11 DIAGNOSIS — I071 Rheumatic tricuspid insufficiency: Secondary | ICD-10-CM | POA: Diagnosis present

## 2024-09-11 DIAGNOSIS — I4510 Unspecified right bundle-branch block: Secondary | ICD-10-CM | POA: Diagnosis present

## 2024-09-11 DIAGNOSIS — E66811 Obesity, class 1: Secondary | ICD-10-CM | POA: Diagnosis present

## 2024-09-11 DIAGNOSIS — R7303 Prediabetes: Secondary | ICD-10-CM | POA: Diagnosis present

## 2024-09-11 DIAGNOSIS — R7401 Elevation of levels of liver transaminase levels: Secondary | ICD-10-CM | POA: Diagnosis present

## 2024-09-11 DIAGNOSIS — E860 Dehydration: Secondary | ICD-10-CM | POA: Diagnosis present

## 2024-09-11 HISTORY — PX: TEMPORARY PACEMAKER: CATH118268

## 2024-09-11 LAB — COMPREHENSIVE METABOLIC PANEL WITH GFR
ALT: 97 U/L — ABNORMAL HIGH (ref 0–44)
AST: 104 U/L — ABNORMAL HIGH (ref 15–41)
Albumin: 3.2 g/dL — ABNORMAL LOW (ref 3.5–5.0)
Alkaline Phosphatase: 86 U/L (ref 38–126)
Anion gap: 12 (ref 5–15)
BUN: 20 mg/dL (ref 8–23)
CO2: 25 mmol/L (ref 22–32)
Calcium: 8.9 mg/dL (ref 8.9–10.3)
Chloride: 102 mmol/L (ref 98–111)
Creatinine, Ser: 0.76 mg/dL (ref 0.44–1.00)
GFR, Estimated: >60 mL/min (ref >60)
Glucose, Bld: 96 mg/dL (ref 70–99)
Potassium: 4 mmol/L (ref 3.5–5.1)
Sodium: 139 mmol/L (ref 135–145)
Total Bilirubin: 1.1 mg/dL (ref 0.0–1.2)
Total Protein: 7.2 g/dL (ref 6.5–8.1)

## 2024-09-11 LAB — URINALYSIS, ROUTINE W REFLEX MICROSCOPIC
Bilirubin Urine: NEGATIVE
Glucose, UA: >=500 mg/dL — AB
Hgb urine dipstick: NEGATIVE
Ketones, ur: NEGATIVE mg/dL
Leukocytes,Ua: NEGATIVE
Nitrite: NEGATIVE
Protein, ur: NEGATIVE mg/dL
Specific Gravity, Urine: 1.009 (ref 1.005–1.030)
pH: 5 (ref 5.0–8.0)

## 2024-09-11 LAB — BASIC METABOLIC PANEL WITH GFR
Anion gap: 14 (ref 5–15)
BUN: 18 mg/dL (ref 8–23)
CO2: 23 mmol/L (ref 22–32)
Calcium: 8.6 mg/dL — ABNORMAL LOW (ref 8.9–10.3)
Chloride: 103 mmol/L (ref 98–111)
Creatinine, Ser: 0.73 mg/dL (ref 0.44–1.00)
GFR, Estimated: >60 mL/min (ref >60)
Glucose, Bld: 97 mg/dL (ref 70–99)
Potassium: 3.7 mmol/L (ref 3.5–5.1)
Sodium: 140 mmol/L (ref 135–145)

## 2024-09-11 LAB — CBC
HCT: 37.7 % (ref 36.0–46.0)
Hemoglobin: 12.9 g/dL (ref 12.0–15.0)
MCH: 32.2 pg (ref 26.0–34.0)
MCHC: 34.2 g/dL (ref 30.0–36.0)
MCV: 94 fL (ref 80.0–100.0)
Platelets: 197 K/uL (ref 150–400)
RBC: 4.01 MIL/uL (ref 3.87–5.11)
RDW: 14 % (ref 11.5–15.5)
WBC: 9.1 K/uL (ref 4.0–10.5)
nRBC: 0 % (ref 0.0–0.2)

## 2024-09-11 LAB — TROPONIN I (HIGH SENSITIVITY)
Troponin I (High Sensitivity): 31 ng/L — ABNORMAL HIGH (ref <18)
Troponin I (High Sensitivity): 44 ng/L — ABNORMAL HIGH (ref <18)
Troponin I (High Sensitivity): 37 ng/L — ABNORMAL HIGH (ref <18)
Troponin I (High Sensitivity): 35 ng/L — ABNORMAL HIGH (ref <18)

## 2024-09-11 LAB — TSH: TSH: 1.182 u[IU]/mL (ref 0.350–4.500)

## 2024-09-11 LAB — LACTIC ACID, PLASMA: Lactic Acid, Venous: 0.8 mmol/L (ref 0.5–1.9)

## 2024-09-11 LAB — MAGNESIUM
Magnesium: 2.4 mg/dL (ref 1.7–2.4)
Magnesium: 2.2 mg/dL (ref 1.7–2.4)

## 2024-09-11 LAB — MRSA NEXT GEN BY PCR, NASAL: MRSA by PCR Next Gen: NOT DETECTED

## 2024-09-11 LAB — PHOSPHORUS: Phosphorus: 3.6 mg/dL (ref 2.5–4.6)

## 2024-09-11 SURGERY — TEMPORARY PACEMAKER
Anesthesia: LOCAL

## 2024-09-11 MED ORDER — SODIUM CHLORIDE 0.9 % IV SOLN
80.0000 mg | INTRAVENOUS | Status: AC
  Filled 2024-09-11: qty 2

## 2024-09-11 MED ORDER — POLYETHYLENE GLYCOL 3350 17 G PO PACK: 17.0000 g | PACK | Freq: Every day | ORAL | Status: DC | PRN

## 2024-09-11 MED ORDER — SODIUM CHLORIDE 0.9% FLUSH
3.0000 mL | Freq: Two times a day (BID) | INTRAVENOUS | Status: DC
  Administered 2024-09-11 – 2024-09-13 (×3): 3 mL via INTRAVENOUS

## 2024-09-11 MED ORDER — ONDANSETRON HCL 4 MG/2ML IJ SOLN: 4.0000 mg | Freq: Four times a day (QID) | INTRAMUSCULAR | Status: DC | PRN

## 2024-09-11 MED ORDER — SODIUM CHLORIDE 0.9 % IV SOLN: INTRAVENOUS | Status: DC

## 2024-09-11 MED ORDER — MELATONIN 3 MG PO TABS
6.0000 mg | ORAL_TABLET | Freq: Every evening | ORAL | Status: DC | PRN
  Administered 2024-09-11: 6 mg via ORAL
  Filled 2024-09-11: qty 2

## 2024-09-11 MED ORDER — ALBUTEROL SULFATE (2.5 MG/3ML) 0.083% IN NEBU: 2.5000 mg | INHALATION_SOLUTION | RESPIRATORY_TRACT | Status: DC | PRN

## 2024-09-11 MED ORDER — SODIUM CHLORIDE 0.9 % IV SOLN
250.0000 mL | INTRAVENOUS | Status: DC
Start: 2024-09-11 — End: 2024-09-11

## 2024-09-11 MED ORDER — HEPARIN (PORCINE) IN NACL 1000-0.9 UT/500ML-% IV SOLN
INTRAVENOUS | Status: DC | PRN
  Administered 2024-09-11: 500 mL

## 2024-09-11 MED ORDER — LACTATED RINGERS IV BOLUS
500.0000 mL | Freq: Once | INTRAVENOUS | Status: AC
  Administered 2024-09-11: 500 mL via INTRAVENOUS

## 2024-09-11 MED ORDER — CHLORHEXIDINE GLUCONATE 4 % EX SOLN
60.0000 mL | Freq: Once | CUTANEOUS | Status: AC
  Administered 2024-09-11 – 2024-09-12 (×2): 4 via TOPICAL

## 2024-09-11 MED ORDER — LIDOCAINE HCL (PF) 1 % IJ SOLN
INTRAMUSCULAR | Status: DC | PRN
  Administered 2024-09-11: 10 mL

## 2024-09-11 MED ORDER — SODIUM CHLORIDE 0.9% FLUSH: 3.0000 mL | INTRAVENOUS | Status: DC | PRN

## 2024-09-11 MED ORDER — SODIUM CHLORIDE 0.9 % IV SOLN
80.0000 mg | INTRAVENOUS | Status: DC
  Filled 2024-09-11: qty 2

## 2024-09-11 MED ORDER — CEFAZOLIN SODIUM-DEXTROSE 2-4 GM/100ML-% IV SOLN: 2.0000 g | INTRAVENOUS | Status: DC

## 2024-09-11 MED ORDER — CHLORHEXIDINE GLUCONATE 4 % EX SOLN: 60.0000 mL | Freq: Once | CUTANEOUS | Status: DC

## 2024-09-11 MED ORDER — CEFAZOLIN SODIUM-DEXTROSE 2-4 GM/100ML-% IV SOLN: 2.0000 g | INTRAVENOUS | Status: AC

## 2024-09-11 MED ORDER — ATORVASTATIN CALCIUM 80 MG PO TABS
80.0000 mg | ORAL_TABLET | Freq: Every day | ORAL | Status: DC
  Administered 2024-09-11 – 2024-09-13 (×3): 80 mg via ORAL
  Filled 2024-09-11: qty 2
  Filled 2024-09-11 (×2): qty 1

## 2024-09-11 MED ORDER — POTASSIUM CHLORIDE CRYS ER 20 MEQ PO TBCR
40.0000 meq | EXTENDED_RELEASE_TABLET | Freq: Once | ORAL | Status: AC
  Administered 2024-09-11: 40 meq via ORAL
  Filled 2024-09-11: qty 2

## 2024-09-11 MED ORDER — CHLORHEXIDINE GLUCONATE 4 % EX SOLN
60.0000 mL | Freq: Once | CUTANEOUS | Status: AC
  Administered 2024-09-12: 4 via TOPICAL
  Filled 2024-09-11: qty 15

## 2024-09-11 MED ORDER — SODIUM CHLORIDE 0.9 % IV SOLN: 250.0000 mL | INTRAVENOUS | Status: DC

## 2024-09-11 MED ORDER — SODIUM CHLORIDE 0.9 % IV SOLN
INTRAVENOUS | Status: DC | PRN
  Administered 2024-09-11: 10 mL/h via INTRAVENOUS

## 2024-09-11 MED ORDER — ORAL CARE MOUTH RINSE: 15.0000 mL | OROMUCOSAL | Status: DC | PRN

## 2024-09-11 MED ORDER — ASPIRIN 81 MG PO TBEC
81.0000 mg | DELAYED_RELEASE_TABLET | Freq: Every day | ORAL | Status: DC
  Administered 2024-09-11 – 2024-09-13 (×3): 81 mg via ORAL
  Filled 2024-09-11 (×3): qty 1

## 2024-09-11 MED ORDER — ACETAMINOPHEN 500 MG PO TABS
1000.0000 mg | ORAL_TABLET | Freq: Four times a day (QID) | ORAL | Status: DC | PRN
  Administered 2024-09-11 – 2024-09-13 (×2): 1000 mg via ORAL
  Filled 2024-09-11 (×2): qty 2

## 2024-09-11 MED ORDER — SODIUM CHLORIDE 0.9% FLUSH
3.0000 mL | Freq: Two times a day (BID) | INTRAVENOUS | Status: DC
  Administered 2024-09-11 – 2024-09-12 (×3): 3 mL via INTRAVENOUS

## 2024-09-11 SURGICAL SUPPLY — 6 items
CABLE ADAPT PACING TEMP 12FT (ADAPTER) IMPLANT
SHEATH PINNACLE 6F 10CM (SHEATH) IMPLANT
SHEATH PROBE COVER 6X72 (BAG) IMPLANT
SHIELD CATHGARD ARROW (MISCELLANEOUS) IMPLANT
WIRE MICRO SET SILHO 5FR 7 (SHEATH) IMPLANT
WIRE PACING TEMP ST TIP 5 (CATHETERS) IMPLANT

## 2024-09-11 NOTE — H&P (Signed)
 History and Physical    IMO CUMBIE FMW:[id number] DOB: 1946-09-29 DOA: 09/10/2024  PCP: Arloa Elsie SAUNDERS, MD   Patient coming from: Home   Chief Complaint:  Chief Complaint  Patient presents with   Dizziness    HPI:  Amy [name] is a 78 y.o. 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  With her recent episode of heart block she had been monitored on a Zio patch prior to this admission, called back in due to 6 second pause.   Reports that this evening she was sitting on her couch and suddenly began to feel very dizzy and lightheaded.  She denies any syncopal episode but was out of it and slid down the couch slightly.  Denies any associated chest pain or shortness of breath.  She had a few episodes thereafter where she felt like it was can start back up.  Has had no episodes while in the emergency department here.  Denies any other recent illness.  Does feel slightly dehydrated but has been taking adequate p.o. no recent losses.   Review of Systems:  ROS complete and negative except as marked above   Allergies  Allergen Reactions   Timolol Shortness Of Breath    Patient does not recall shortness of breath; believes it caused itching/pain in eye.   Codeine Nausea And Vomiting   Morphine And Codeine Nausea And Vomiting   Other Other (See Comments)    vicryl sutures - rash/redness, delay in healing    Prior to Admission medications   Medication Sig Start Date End Date Taking? Authorizing Provider  acetaminophen  (TYLENOL ) 500 MG tablet Take 500 mg by mouth every 6 (six) hours as needed for mild pain or headache.    [provider]  aspirin  81 MG EC tablet Take 81 mg  by mouth every evening.    [provider]  atorvastatin  (LIPITOR ) 80 MG tablet Take 1 tablet (80 mg total) by mouth daily. 10/24/21   Bryn Bernardino NOVAK, MD  B Complex-C (B-COMPLEX WITH VITAMIN C) tablet Take 1 tablet by mouth daily. 11/21/15   [provider]  BORON PO Take 1 tablet by mouth daily.    [provider]  Calcium -Magnesium-Zinc (CAL-MAG-ZINC PO) Take 1 tablet by mouth 3 (three) times daily.    [provider]  Carboxymethylcellulose Sodium (DRY EYE RELIEF OP) Place 1 drop into both eyes 4 (four) times daily as needed (dryness).    [provider]  cetirizine (ZYRTEC) 10 MG tablet Take 10 mg by mouth daily.    [provider]  Cholecalciferol (VITAMIN D3) 10 MCG (400 UNIT) tablet Take 400 Units by mouth 2 (two) times daily.    [provider]  Coenzyme Q10 (CO Q-10) 200 MG CAPS Take 200 mg by mouth daily.    [provider]  DHEA 25 MG tablet Take 25 mg by mouth daily.    [provider]  empagliflozin  (JARDIANCE ) 10 MG TABS tablet Take 1 tablet (10 mg total) by mouth daily. PLEASE SCHEDULE APPOINTMENT FOR MORE REFILLS Patient not taking: Reported on 09/07/2024 06/16/24   Colletta Manuelita Garre, PA-C  Glucosamine-Chondroitin 750-600 MG TABS Take 1 tablet by mouth 2 (two) times daily.    [provider]  losartan  (  COZAAR ) 25 MG tablet Take 0.5 tablets (12.5 mg total) by mouth daily. 09/08/24 09/08/25  Sebastian Lamarr SAUNDERS, PA-C  meclizine  (ANTIVERT ) 25 MG tablet Take 25 mg by mouth daily. 09/12/21   [provider]  Methylsulfonylmethane (MSM) 1000 MG CAPS Take 1,000 mg by mouth daily.    [provider]  Misc Natural Products (BEET ROOT) 500 MG CAPS Take 500 mg by mouth 3 (three) times daily.    [provider]  Omega-3 Fatty Acids (FISH OIL PO) Take 1 capsule by mouth 2 (two) times daily.    [provider]  OVER THE COUNTER MEDICATION Take 1 capsule by mouth daily. Sea  Mining Engineer, Historical, MD  OVER THE COUNTER MEDICATION Place 1 Application into both nostrils daily as needed (congestion). Vicks Nasal Sticks    [provider]  potassium chloride  (KLOR-CON ) 10 MEQ tablet Take 1 tablet (10 mEq total) by mouth 3 (three) times a week. Patient taking differently: Take 5 mEq by mouth daily. 10/30/23   Lee, Jordan, NP  Pramoxine-Dimethicone (GOLD BOND INTENSIVE HEALING EX) Apply 1 application  topically daily as needed (eczema).    [provider]  torsemide  (DEMADEX ) 20 MG tablet Take 0.5 tablets (10 mg total) by mouth daily. 07/29/24   Thukkani, Arun K, MD  vitamin E 180 MG (400 UNITS) capsule Take 400 Units by mouth daily.    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 Wendel and Dr. Lucas   Severe aortic stenosis     Past Surgical History:  Procedure Laterality Date   CARDIAC CATHETERIZATION     CARPAL TUNNEL RELEASE Bilateral    FEMUR FRACTURE SURGERY     INTRAOPERATIVE TRANSTHORACIC ECHOCARDIOGRAM N/A 09/06/2024   Procedure: ECHOCARDIOGRAM, TRANSTHORACIC;  Surgeon: Wendel Lurena POUR, MD;  Location: [facility];  Service: Cardiovascular;  Laterality: N/A;   REPLACEMENT TOTAL KNEE BILATERAL     RIGHT HEART CATH AND CORONARY ANGIOGRAPHY N/A 07/29/2024   Procedure: RIGHT HEART CATH AND CORONARY ANGIOGRAPHY;  Surgeon: Wendel Lurena POUR, MD;  Location: [facility];  Service: Cardiovascular;  Laterality: N/A;   RIGHT/LEFT HEART CATH AND CORONARY ANGIOGRAPHY N/A 10/22/2021   Procedure: RIGHT/LEFT HEART CATH AND CORONARY ANGIOGRAPHY;  Surgeon: Claudene Victory ORN, MD;  Location: [facility];  Service: Cardiovascular;  Laterality: N/A;   RIGHT/LEFT HEART CATH AND CORONARY ANGIOGRAPHY N/A 02/28/2022   Procedure: RIGHT/LEFT HEART CATH AND CORONARY ANGIOGRAPHY;  Surgeon: Cherrie Toribio SAUNDERS, MD;  Location: [facility];  Service: Cardiovascular;  Laterality: N/A;   RIGHT/LEFT HEART CATH AND CORONARY ANGIOGRAPHY N/A 08/11/2023   Procedure: RIGHT/LEFT HEART CATH AND CORONARY ANGIOGRAPHY;  Surgeon: Cherrie Toribio SAUNDERS, MD;  Location: [facility];  Service: Cardiovascular;  Laterality: N/A;   SHOULDER SURGERY Right    rotator cuff     reports that she has never smoked. She has never used smokeless tobacco. She reports that she does not drink alcohol  and does not use drugs.  Family History  Problem Relation Age of Onset   CAD Neg Hx  Physical Exam: Vitals:   09/11/24 0215 09/11/24 0313 09/11/24 0515 09/11/24 0615  BP: (!) 107/53  125/62 (!) 122/58  Pulse: 87  89 85  Resp: (!) 28  (!) 26 20  Temp:  98.2 F (36.8 C)    TempSrc:  Oral    SpO2: 91%  93% 99%  Weight:      Height:        Gen: Awake, alert, NAD   CV: Regular, normal S1, S2, no murmurs  Resp: Normal WOB, CTAB  Abd: Flat, normoactive, nontender MSK: Symmetric, no edema  Skin: No rashes or lesions to exposed skin  Neuro: Alert and interactive  Psych: euthymic, appropriate    Data review:   Labs reviewed, notable for:   This a.m. K3.7, other electrolytes within goal Bicarb 23, AG 14 High-sensitivity Trop 31 -> 44 TSH within normal limits Blood counts unremarkable  Micro:  Results for orders placed or performed during the hospital encounter of 09/02/24  Surgical pcr screen     Status: None   Collection Time: 09/02/24 11:44 AM   Specimen: Nasal Mucosa; Nasal Swab  Result Value Ref Range Status   MRSA, PCR NEGATIVE NEGATIVE Final   Staphylococcus aureus NEGATIVE NEGATIVE Final    Comment: (NOTE) The Xpert SA Assay (FDA approved for NASAL specimens in patients 41 years of age and  older), is one component of a comprehensive surveillance program. It is not intended to diagnose infection nor to guide or monitor treatment. Performed at [facility], [address] [place], [place] [address]     Imaging reviewed:  DG Chest Portable 1 View Result Date: 09/10/2024 EXAM: 1 VIEW(S) XRAY OF THE CHEST 09/10/2024 11:05:00 PM COMPARISON: 09/02/2024 CLINICAL HISTORY: Sinus pause. Dizziness FINDINGS: LUNGS AND PLEURA: No focal pulmonary opacity. No pulmonary edema. No pleural effusion. No pneumothorax. HEART AND MEDIASTINUM: Thoracic atherosclerosis. Status post TAVR. Cardiomegaly. BONES AND SOFT TISSUES: No acute osseous abnormality. IMPRESSION: 1. Cardiomegaly. 2. Otherwise negative. Electronically signed by: Pinkie Pebbles MD 09/10/2024 11:07 PM EDT RP Workstation: [id number]    EKG:  Personally reviewed, sinus rhythm, RBBB, discordant ST changes without overt ischemic change, inferior Q waves old   Reviewed tele  Occasional PVC, doublets / NSVT. No heart block that I saw on my review.   ED Course:  Patient had been referred to the emergency department by cardiology and was reinvolved by EDP  recommending for medical admission and evaluation by cardiology in a.m.   Assessment/Plan:  78 y.o. 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  With her recent episode of heart block she had been monitored on a Zio patch prior to this admission, called back in due to 6 second pause.   Sinus arrest, 6 second pause  Recent history of TAVR, hospital course with transient CHB requiring transvenous pacing - Cardiology to consult this a.m., will need EP evaluation.  Anticipate will need  pacemaker - No reversible causes identified, avoid nodal blocking agents - Replete electrolytes - N.p.o. in case requires procedure  Acute myocardial injury No chest pain. High-sensitivity Trop 31 -> 44.  EKG with no acute ischemic changes.  Suspect demand ischemia in the setting of underlying heart block/symptomatic bradycardia which has resolved   - Continue home aspirin , atorvastatin  -  Continue to trend troponin - Defer need for echo to cards  Anion gap - Check lactate  Chronic medical problems: CAD, history of STEMI '23: Continuing on aspirin  and statin per above HFrEF: Hold diuretics pending her lactate.  Hold her home losartan  BP is reasonably controlled. Think slightly volume down, give 500 cc  RBBB: Noted Severe TR: Noted Hypertension: See HFrEF above Hyperlipidemia: See CAD Prediabetes: Not currently requiring SSI.  Check A1c  Body mass index is 32.46 kg/m. Obesity class I    DVT prophylaxis:  SCDs Code Status:  Full Code Diet:  Diet Orders (From admission, onward)     Start     Ordered   09/11/24 0118  Diet NPO time specified Except for: Sips with Meds  Diet effective now       Question:  Except for  Answer:  Noralyn with Meds   09/11/24 0118           Family Communication:  None   Consults:  Cardiology   Admission status:   Inpatient, Step Down Unit  Severity of Illness: The appropriate patient status for this patient is INPATIENT. Inpatient status is judged to be reasonable and necessary in order to provide the required intensity of service to ensure the patient's safety. The patient's presenting symptoms, physical exam findings, and initial radiographic and laboratory data in the context of their chronic comorbidities is felt to place them at high risk for further clinical deterioration. Furthermore, it is not anticipated that the patient will be medically stable for discharge from the hospital within 2 midnights of admission.   * I certify that at the point  of admission it is my clinical judgment that the patient will require inpatient hospital care spanning beyond 2 midnights from the point of admission due to high intensity of service, high risk for further deterioration and high frequency of surveillance required.*   Dorn Dawson, MD [facility]  How to contact the [facility] Attending or Consulting provider 7A - 7P or covering provider during after hours 7P -7A, for this patient.  Check the care team in [facility] and look for a) attending/consulting [facility] provider listed and b) the [facility] team listed Log into [email or web address] and use [place]'s universal password to access. If you do not have the password, please contact the hospital operator. Locate the [facility] provider you are looking for under [facility] and page to a number that you can be directly reached. If you still have difficulty reaching the provider, please page the [facility] (Director on Call) for the Hospitalists listed on amion for assistance.  09/11/2024, 6:50 AM

## 2024-09-11 NOTE — Significant Event (Signed)
 Rapid Response Event Note   Reason for Call :  Pt unresponsive  Initial Focused Assessment:  On arrival, pt A&O, follows commands. She states that she could hear the staff but was unable to respond. After reviewing tele, pt appears to have had about 19sec pause.   VS: HR 90, BP 141/76, RR 26, spO2 98% on 4L [place]   Interventions:  Zoll pads placed EKG Cards and primary team to bedside.   Plan of Care:  Transfer to 2H  To CCL for temporary pacemaker   Event Summary:   MD Notified: PTA Call Time: 1545 Arrival Time: 1548 End Time: 1705  Nat Blunt, RN

## 2024-09-11 NOTE — Plan of Care (Signed)

## 2024-09-11 NOTE — Progress Notes (Signed)
 PROGRESS NOTE  Amy [name] FMW:[id number] DOB: [birth year] DOA: 09/10/2024 PCP: Arloa Elsie SAUNDERS, MD  HPI/Recap of past 24 hours: Amy [name] is a 78 y.o. 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  With her recent episode of heart block she had been monitored on a Zio patch prior to this admission, called back in due to 6 second pause.    Suddenly began to feel very dizzy and lightheaded while at rest and sitting on her couch.  She denies any syncopal episode but was out of it and slid down the couch slightly.  Denies any associated chest pain or shortness of breath.    09/11/2024: The patient was seen and examined at bedside.  Endorses no chest pain.  Stating she has never had chest pain even when she had her 2 MIs.  For the first MI she had pain on her left side as if someone had punched her there.  For her second MI she had pain on her right side too, as if someone had punched her there.  Assessment/Plan: Principal Problem:   Symptomatic bradycardia  Sinus arrest, 6 second pause  Recent history of TAVR, hospital course with transient complete heart block requiring transvenous pacing - Seen by cardiology this a.m., will need EP evaluation.  Anticipate will need pacemaker - No reversible causes identified, avoid nodal blocking agents - Replete electrolytes   Acute myocardial injury No chest pain. High-sensitivity Trop 31 -> 44.  EKG with no acute ischemic changes.  Suspect demand ischemia in the setting of underlying heart block/symptomatic bradycardia which has resolved   - Continue home aspirin , atorvastatin  - Continue to trend troponin - Defer need for echo to cards    Anion gap - Check lactate   Chronic medical problems: CAD, history of STEMI '23: Continuing on aspirin  and statin per above HFrEF: Hold diuretics pending her lactate.  Hold her home losartan  BP is reasonably controlled. Think slightly volume down, give 500 cc  RBBB: Noted Severe TR: Noted Hypertension: See HFrEF above Hyperlipidemia: See CAD Prediabetes: Not currently requiring SSI.  Check A1c    Time: 35 minutes.    Code Status: Full code.  Family Communication: None at bedside.  Disposition Plan: Admitted to progressive care unit.   Consultants: Cardiology.  Procedures: None.  Antimicrobials: None.  DVT prophylaxis: SCDs.  Status is: Inpatient The patient requires at least 2 midnights for further evaluation and treatment of present condition.    Objective: Vitals:   09/11/24 0800 09/11/24 0900 09/11/24 1030 09/11/24 1142  BP: 91/74 127/68 130/71   Pulse: 85 87 87   Resp: 20 (!) 23 (!) 21   Temp:    98.6 F (37 C)  TempSrc:    Oral  SpO2: 100% 100% 100%   Weight:      Height:        Intake/Output Summary (Last 24 hours) at 09/11/2024 1255 Last data filed at 09/11/2024 9075 Gross per 24 hour  Intake 500.19 ml  Output --  Net 500.19 ml   Filed Weights   09/10/24 2102  Weight: 68 kg    Exam:  General: 78 y.o. year-old female well developed well nourished in no acute distress.  Alert and oriented x3. Cardiovascular: Regular rate and  rhythm with no rubs or gallops.  No thyromegaly or JVD noted.   Respiratory: Clear to auscultation with no wheezes or rales. Good inspiratory effort. Abdomen: Soft nontender nondistended with normal bowel sounds x4 quadrants. Musculoskeletal: No lower extremity edema. 2/4 pulses in all 4 extremities. Skin: No ulcerative lesions noted or rashes, Psychiatry: Mood is appropriate for condition and setting   Data Reviewed: CBC: Recent Labs  Lab 09/07/24 0755 09/08/24 0303 09/10/24 2320  WBC 9.8 9.4 8.8  HGB  13.0 12.5 12.8  HCT 38.1 36.5 38.4  MCV 95.3 95.1 95.0  PLT 163 133* 183   Basic Metabolic Panel: Recent Labs  Lab 09/07/24 0755 09/08/24 0303 09/10/24 2320 09/11/24 0255  NA 135 132* 139 140  K 4.1 4.4 4.0 3.7  CL 100 103 102 103  CO2 24 21* 25 23  GLUCOSE 148* 100* 96 97  BUN 16 23 20 18   CREATININE 0.82 0.90 0.76 0.73  CALCIUM  8.4* 7.9* 8.9 8.6*  MG 2.0  --  2.4 2.2  PHOS  --   --   --  3.6   GFR: Estimated Creatinine Clearance: 46.9 mL/min (by C-G formula based on SCr of 0.73 mg/dL). Liver Function Tests: Recent Labs  Lab 09/10/24 2320  AST 104*  ALT 97*  ALKPHOS 86  BILITOT 1.1  PROT 7.2  ALBUMIN 3.2*   No results for input(s): LIPASE, AMYLASE in the last 168 hours. No results for input(s): AMMONIA in the last 168 hours. Coagulation Profile: No results for input(s): INR, PROTIME in the last 168 hours. Cardiac Enzymes: No results for input(s): CKTOTAL, CKMB, CKMBINDEX, TROPONINI in the last 168 hours. BNP (last 3 results) No results for input(s): PROBNP in the last 8760 hours. HbA1C: No results for input(s): HGBA1C in the last 72 hours. CBG: Recent Labs  Lab 09/10/24 2326  GLUCAP 91   Lipid Profile: No results for input(s): CHOL, HDL, LDLCALC, TRIG, CHOLHDL, LDLDIRECT in the last 72 hours. Thyroid  Function Tests: Recent Labs    09/11/24 0255  TSH 1.182   Anemia Panel: No results for input(s): VITAMINB12, FOLATE, FERRITIN, TIBC, IRON, RETICCTPCT in the last 72 hours. Urine analysis:    Component Value Date/Time   COLORURINE STRAW (A) 09/11/2024 0332   APPEARANCEUR CLEAR 09/11/2024 0332   LABSPEC 1.009 09/11/2024 0332   PHURINE 5.0 09/11/2024 0332   GLUCOSEU >=500 (A) 09/11/2024 0332   HGBUR NEGATIVE 09/11/2024 0332   BILIRUBINUR NEGATIVE 09/11/2024 0332   KETONESUR NEGATIVE 09/11/2024 0332   PROTEINUR NEGATIVE 09/11/2024 0332   UROBILINOGEN 0.2 10/21/2009 1813   NITRITE NEGATIVE 09/11/2024 0332    LEUKOCYTESUR NEGATIVE 09/11/2024 0332   Sepsis Labs: @LABRCNTIP (procalcitonin:4,lacticidven:4)  ) Recent Results (from the past 240 hours)  Surgical pcr screen     Status: None   Collection Time: 09/02/24 11:44 AM   Specimen: Nasal Mucosa; Nasal Swab  Result Value Ref Range Status   MRSA, PCR NEGATIVE NEGATIVE Final   Staphylococcus aureus NEGATIVE NEGATIVE Final    Comment: (NOTE) The Xpert SA Assay (FDA approved for NASAL specimens in patients 75 years of age and older), is one component of a comprehensive surveillance program. It is not intended to diagnose infection nor to guide or monitor treatment. Performed at [facility], [address], [place], [place] [address]       Studies: DG Chest Portable 1 View Result Date: 09/10/2024 EXAM: 1 VIEW(S) XRAY OF THE CHEST 09/10/2024 11:05:00 PM COMPARISON: 09/02/2024 CLINICAL HISTORY: Sinus pause. Dizziness FINDINGS: LUNGS  AND PLEURA: No focal pulmonary opacity. No pulmonary edema. No pleural effusion. No pneumothorax. HEART AND MEDIASTINUM: Thoracic atherosclerosis. Status post TAVR. Cardiomegaly. BONES AND SOFT TISSUES: No acute osseous abnormality. IMPRESSION: 1. Cardiomegaly. 2. Otherwise negative. Electronically signed by: Pinkie Pebbles MD 09/10/2024 11:07 PM EDT RP Workstation: [id number]    Scheduled Meds:  aspirin  EC  81 mg Oral Daily   atorvastatin   80 mg Oral Daily   sodium chloride  flush  3 mL Intravenous Q12H    Continuous Infusions:  sodium chloride  Stopped (09/10/24 2338)     LOS: 0 days     Terry LOISE Hurst, MD [facility] Pager [phone]  If 7PM-7AM, please contact night-coverage [email or web address] Password [email or web address] 09/11/2024, 12:55 PM

## 2024-09-11 NOTE — Progress Notes (Addendum)
 Called to see patient for recurrent syncope. Per nursing report, first episode happened while transferring beds up from ED and was not on monitor at that time. Second episode happened 10 mins later associated with 19 sec of ventricular standstill. Rapid response called, consciousness regained, no injuries sustained. Subsequent BP 141/76, pulse 88. EKG showing NSR with known RBBB. She feels a little weak now but improved, A+Ox3. Pacer pads are in place on patient, Zoll at bedside. Called Dr. Kennyth urgently. We have activated cath lab to prepare to place temporary wire. Dr. Shona put in transfer order to [facility]. Changed diet to NPO. Discussed risks/benefits with patient who is agreeable. Per d/w Dr. Kennyth, cardiology will assume primary in the unit. He also tentatively plans for permanent pacemaker tomorrow - he is in the hospital tomorrow for final planning.

## 2024-09-11 NOTE — Consult Note (Addendum)
 Cardiology Consultation   Patient ID: Amy [name] MRN: [id number]; DOB: [birth year]  Admit date: 09/10/2024 Date of Consult: 09/11/2024  PCP:  Arloa Elsie SAUNDERS, MD   [facility] Providers Cardiologist:  Amy Bruckner, MD  Structural Heart:  [name], MD     Patient Profile: Amy [name] is a 78 y.o. female with a hx of severe aortic stenosis s/p TAVR 08/2024, NICM, CAD, dyslipidemia, prediabetes, obesity, and RBBB who is being seen 09/11/2024 for the evaluation of AB block at the request of Dr. Shona.  History of Present Illness:  Amy [name] with a history of late presenting STEMI April 2023 found to have reduced LVEF 30-35%.  Heart catheterization showed CAD with interval occlusion of ostial RCA with associated large RV infarct.  She also had borderline 60% left main lesion and hemodynamics consistent with severe RV failure and severe TR.  She was supported with inotropes and developed a transient second-degree Mobitz type II block which resolved prior to discharge.  With GDMT, her LVEF improved to 40-45%.  She was followed by the advanced heart failure team.  Repeat echocardiogram 07/13/2024 showed an LVEF 40% with LF LG AAS with a mean gradient 26.5 mmHg, V-max 3.52, AVA 0.93.  She was also noted to have mild MR and moderate to severe TR.  She underwent right and left heart catheterization 07/29/2024 that showed chronically occluded mid RCA with mild to moderate disease in the LAD and left circumflex.  Right heart pressures were elevated and torsemide  changed from as needed to daily.  She was referred to the structural heart team and underwent successful TAVR on 09/06/2024.  Postop echo with an LVEF 35%, LV dilation, moderate RVE, mild pulmonary hypertension, moderate MAC, severe TR but normally functioning TAVR.  Given hx of RBBB, TAVR, and intermittent second degree heart block, pt was discharged on 09/08/24 with a live zio to monitor for high grade heart block.  BB was held. Cardiology service was notified yesterday of 5-10.8 sec sinus pauses. Pt was called and she reported intermittent dizziness. She was advised to present to the ER for further evaluation. She has not been on AV nodal agents.   She was admitted and cardiology consulted. She confirmed she felt dizziness and lightheaded yesterday intermittently in the afternoon, she was awake at the time of the pauses noted on the heart monitor. She feels a bit better this morning in the ER. She tries to be active and takes care of ADLs at baseline.   She denies signs/symptoms of volume overload, chest pain. She has been compliant with medications including losartan , jardiance , and torsemide .  Her daughter is currently hospitalized, she requests communications about her medical issues be conveyed to her sister and not her daughter.   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 Red and Dr. Lucas   Severe aortic stenosis     Past Surgical History:  Procedure  Laterality Date   CARDIAC CATHETERIZATION     CARPAL TUNNEL RELEASE Bilateral    FEMUR FRACTURE SURGERY     INTRAOPERATIVE TRANSTHORACIC ECHOCARDIOGRAM N/A 09/06/2024   Procedure: ECHOCARDIOGRAM, TRANSTHORACIC;  Surgeon: Wendel Lurena POUR, MD;  Location: [facility];  Service: Cardiovascular;  Laterality: N/A;   REPLACEMENT TOTAL KNEE BILATERAL     RIGHT HEART CATH AND CORONARY ANGIOGRAPHY N/A 07/29/2024   Procedure: RIGHT HEART CATH AND CORONARY  ANGIOGRAPHY;  Surgeon: Wendel Lurena POUR, MD;  Location: [facility];  Service: Cardiovascular;  Laterality: N/A;   RIGHT/LEFT HEART CATH AND CORONARY ANGIOGRAPHY N/A 10/22/2021   Procedure: RIGHT/LEFT HEART CATH AND CORONARY ANGIOGRAPHY;  Surgeon: Claudene Victory ORN, MD;  Location: [facility];  Service: Cardiovascular;  Laterality: N/A;   RIGHT/LEFT HEART CATH AND CORONARY ANGIOGRAPHY N/A 02/28/2022   Procedure: RIGHT/LEFT HEART CATH AND CORONARY ANGIOGRAPHY;  Surgeon: Cherrie Toribio SAUNDERS, MD;  Location: [facility];  Service: Cardiovascular;  Laterality: N/A;   RIGHT/LEFT HEART CATH AND CORONARY ANGIOGRAPHY N/A 08/11/2023   Procedure: RIGHT/LEFT HEART CATH AND CORONARY ANGIOGRAPHY;  Surgeon: Cherrie Toribio SAUNDERS, MD;  Location: [facility];  Service: Cardiovascular;  Laterality: N/A;   SHOULDER SURGERY Right    rotator cuff     Home Medications:  Prior to Admission medications   Medication Sig Start Date End Date Taking? Authorizing Provider  acetaminophen  (TYLENOL ) 500 MG tablet Take 500 mg by mouth every 6 (six) hours as needed for mild pain or headache.   Yes [provider]  aspirin  81 MG EC tablet Take 81 mg by mouth every evening.   Yes [provider]  atorvastatin  (LIPITOR ) 80 MG tablet Take 1 tablet (80 mg total) by mouth daily. 10/24/21  Yes Bryn Bernardino NOVAK, MD  B Complex-C (B-COMPLEX WITH VITAMIN C) tablet Take 1 tablet by mouth daily. 11/21/15  Yes [provider]  BORON PO Take 1 tablet by mouth daily.   Yes [provider]  Calcium -Magnesium-Zinc (CAL-MAG-ZINC PO) Take 1 tablet by mouth 3 (three) times daily.   Yes [provider]  Carboxymethylcellulose Sodium (DRY EYE RELIEF OP) Place 1 drop into both eyes 4 (four) times daily as needed (dryness).   Yes [provider]  cetirizine (ZYRTEC) 10 MG tablet Take 10 mg by mouth daily.   Yes [provider]  Cholecalciferol (VITAMIN D3) 10 MCG (400 UNIT)  tablet Take 400 Units by mouth 2 (two) times daily.   Yes [provider]  Coenzyme Q10 (CO Q-10) 200 MG CAPS Take 200 mg by mouth daily.   Yes [provider]  DHEA 25 MG tablet Take 25 mg by mouth daily.   Yes [provider]  empagliflozin  (JARDIANCE ) 10 MG TABS tablet Take 1 tablet (10 mg total) by mouth daily. PLEASE SCHEDULE APPOINTMENT FOR MORE REFILLS 06/16/24  Yes Colletta Manuelita Garre, PA-C  Glucosamine-Chondroitin 750-600 MG TABS Take 1 tablet by mouth 2 (two) times daily.   Yes [provider]  losartan  (COZAAR ) 25 MG tablet Take 0.5 tablets (12.5 mg total) by mouth daily. 09/08/24 09/08/25 Yes Sebastian Lamarr SAUNDERS, PA-C  meclizine  (ANTIVERT ) 25 MG tablet Take 25 mg by mouth daily. 09/12/21  Yes [provider]  Methylsulfonylmethane (MSM) 1000 MG CAPS Take 1,000 mg by mouth daily.   Yes [provider]  Misc Natural Products (BEET ROOT) 500 MG CAPS Take 500 mg by mouth 3 (three) times daily.   Yes [provider]  Omega-3 Fatty Acids (FISH OIL PO) Take 1 capsule by mouth 2 (two) times daily.   Yes [provider]  OVER THE COUNTER MEDICATION Take 1 capsule by mouth daily. Sea Kelp   Yes [provider]  OVER THE COUNTER MEDICATION Place 1 Application into both nostrils daily as needed (congestion). Vicks Nasal Sticks   Yes [provider]  potassium chloride  (KLOR-CON ) 10 MEQ tablet Take 1 tablet (10 mEq total) by mouth 3 (three) times a week. Patient taking differently: Take 5 mEq by mouth daily. 10/30/23  Yes Lee, Jordan, NP  Pramoxine-Dimethicone (GOLD BOND INTENSIVE HEALING EX) Apply 1 application  topically daily as needed (eczema).   Yes [provider]  torsemide  (DEMADEX ) 20 MG tablet Take 0.5 tablets (10 mg total) by mouth daily. 07/29/24  Yes Thukkani, Arun K, MD  vitamin E 180 MG (400 UNITS) capsule Take 400 Units by mouth daily.   Yes [provider]    Scheduled  Meds:  aspirin  EC  81 mg Oral Daily   atorvastatin   80 mg Oral Daily   sodium chloride  flush  3 mL Intravenous Q12H   Continuous Infusions:  sodium chloride  Stopped (09/10/24 2338)   PRN Meds: acetaminophen , albuterol , melatonin, ondansetron  (ZOFRAN ) IV, polyethylene glycol  Allergies:    Allergies  Allergen Reactions   Timolol Shortness Of Breath    Patient does not recall shortness of breath; believes it caused itching/pain in eye.   Codeine Nausea And Vomiting   Morphine And Codeine Nausea And Vomiting   Other Other (See Comments)    vicryl sutures - rash/redness, delay in healing    Social History:   Social History   Socioeconomic History   Marital status: Widowed    Spouse name: Not on file   Number of children: Not on file   Years of education: 12   Highest education level: High school graduate  Occupational History   Occupation: retired  Tobacco Use   Smoking status: Never   Smokeless tobacco: Never  Vaping Use   Vaping status: Not on file  Substance and Sexual Activity   Alcohol  use: No   Drug use: No   Sexual activity: Not Currently  Other Topics Concern   Not on file  Social History Narrative   Not on file   Social Drivers of Health   Financial Resource Strain: Not on file  Food Insecurity: No Food Insecurity (09/07/2024)   Hunger Vital Sign    Worried About Running Out of Food in the Last Year: Never true    Ran Out of Food in the Last Year: Never true  Transportation Needs: No Transportation Needs (09/08/2024)   PRAPARE - Administrator, Civil Service (Medical): No    Lack of Transportation (Non-Medical): No  Physical Activity: Not on file  Stress: Not on file  Social Connections: Moderately Isolated (09/07/2024)   Social Connection and Isolation Panel    Frequency of Communication with Friends and Family: Twice a week    Frequency of Social Gatherings with Friends and Family: Once a week    Attends Religious Services: 1 to 4  times per year    Active Member of [employer] or Organizations: No    Attends Banker Meetings: Never    Marital Status: Widowed  Intimate Partner Violence: Not At Risk (09/07/2024)   Humiliation, Afraid, Rape, and Kick questionnaire    Fear of Current or Ex-Partner: No    Emotionally Abused: No  Physically Abused: No    Sexually Abused: No    Family History:    Family History  Problem Relation Age of Onset   CAD Neg Hx      ROS:  Please see the history of present illness.   All other ROS reviewed and negative.     Physical Exam/Data: Vitals:   09/11/24 0615 09/11/24 0748 09/11/24 0800 09/11/24 0900  BP: (!) 122/58  91/74 127/68  Pulse: 85  85 87  Resp: 20  20 (!) 23  Temp:  97.9 F (36.6 C)    TempSrc:  Oral    SpO2: 99%  100% 100%  Weight:      Height:        Intake/Output Summary (Last 24 hours) at 09/11/2024 0933 Last data filed at 09/11/2024 9075 Gross per 24 hour  Intake 500.19 ml  Output --  Net 500.19 ml      09/10/2024    9:02 PM 09/08/2024    6:00 AM 09/07/2024    4:21 AM  Last 3 Weights  Weight (lbs) 150 lb 154 lb 12.8 oz 165 lb 5.5 oz  Weight (kg) 68.04 kg 70.217 kg 75 kg     Body mass index is 32.46 kg/m.  General:  elderly female in NAD HEENT: normal Neck: no JVD Vascular: No carotid bruits; Distal pulses 2+ bilaterally Cardiac:  normal S1, S2; RRR; 3/6 murmur heard at LSB Lungs:  clear to auscultation bilaterally, no wheezing, rhonchi or rales  Abd: soft, nontender, no hepatomegaly  Ext: no edema Musculoskeletal:  No deformities, BUE and BLE strength normal and equal Skin: warm and dry  Neuro:  CNs 2-12 intact, no focal abnormalities noted Psych:  Normal affect   EKG:  The EKG was personally reviewed and demonstrates:  SR with HR 81, RBBB, prior inferior infarct Telemetry:  Telemetry was personally reviewed and demonstrates:  sinus rhythm with HR in the 80s, moderate PVCs  Relevant CV Studies:  Echo 09/07/24:  1. Left  ventricular ejection fraction, by estimation, is 35 to 40%. The  left ventricle has moderately decreased function. Left ventricular  endocardial border not optimally defined to evaluate regional wall motion.  The left ventricular internal cavity  size was mildly dilated. Left ventricular diastolic parameters are  consistent with Grade I diastolic dysfunction (impaired relaxation). There  is hypokinesis of the left ventricular, mid inferolateral wall. There is  akinesis of the left ventricular,  entire inferior wall.   2. Right ventricular systolic function is normal. The right ventricular  size is moderately enlarged. There is mildly elevated pulmonary artery  systolic pressure.   3. Left atrial size was moderately dilated.   4. Right atrial size was mildly dilated.   5. The mitral valve is degenerative. Trivial mitral valve regurgitation.  Moderate mitral annular calcification.   6. Tricuspid valve regurgitation is severe.   7. The aortic valve has been repaired/replaced. Aortic valve  regurgitation is trivial. There is a 23 mm Sapien prosthetic (TAVR) valve  present in the aortic position. Procedure Date: 09/06/24. Aortic valve  area, by VTI measures 1.05 cm. Aortic valve  mean gradient measures 8.5 mmHg. Aortic valve Vmax measures 1.88 m/s.   8. The inferior vena cava is normal in size with greater than 50%  respiratory variability, suggesting right atrial pressure of 3 mmHg.    Laboratory Data: High Sensitivity Troponin:   Recent Labs  Lab 09/10/24 2320 09/11/24 0544  TROPONINIHS 31* 44*     Chemistry Recent Labs  Lab 09/07/24 0755 09/08/24 0303 09/10/24 2320 09/11/24 0255  NA 135 132* 139 140  K 4.1 4.4 4.0 3.7  CL 100 103 102 103  CO2 24 21* 25 23  GLUCOSE 148* 100* 96 97  BUN 16 23 20 18   CREATININE 0.82 0.90 0.76 0.73  CALCIUM  8.4* 7.9* 8.9 8.6*  MG 2.0  --  2.4 2.2  GFRNONAA >60 >60 >60 >60  ANIONGAP 11 8 12 14     Recent Labs  Lab 09/10/24 2320  PROT  7.2  ALBUMIN 3.2*  AST 104*  ALT 97*  ALKPHOS 86  BILITOT 1.1   Lipids No results for input(s): CHOL, TRIG, HDL, LABVLDL, LDLCALC, CHOLHDL in the last 168 hours.  Hematology Recent Labs  Lab 09/07/24 0755 09/08/24 0303 09/10/24 2320  WBC 9.8 9.4 8.8  RBC 4.00 3.84* 4.04  HGB 13.0 12.5 12.8  HCT 38.1 36.5 38.4  MCV 95.3 95.1 95.0  MCH 32.5 32.6 31.7  MCHC 34.1 34.2 33.3  RDW 14.1 14.0 14.0  PLT 163 133* 183   Thyroid   Recent Labs  Lab 09/11/24 0255  TSH 1.182    BNPNo results for input(s): BNP, PROBNP in the last 168 hours.  DDimer No results for input(s): DDIMER in the last 168 hours.  Radiology/Studies:  DG Chest Portable 1 View Result Date: 09/10/2024 EXAM: 1 VIEW(S) XRAY OF THE CHEST 09/10/2024 11:05:00 PM COMPARISON: 09/02/2024 CLINICAL HISTORY: Sinus pause. Dizziness FINDINGS: LUNGS AND PLEURA: No focal pulmonary opacity. No pulmonary edema. No pleural effusion. No pneumothorax. HEART AND MEDIASTINUM: Thoracic atherosclerosis. Status post TAVR. Cardiomegaly. BONES AND SOFT TISSUES: No acute osseous abnormality. IMPRESSION: 1. Cardiomegaly. 2. Otherwise negative. Electronically signed by: Pinkie Pebbles MD 09/10/2024 11:07 PM EDT RP Workstation: [id number]   ECHOCARDIOGRAM COMPLETE Result Date: 09/07/2024    ECHOCARDIOGRAM REPORT   Patient Name:   Amy [name] Date of Exam: 09/07/2024 Medical Rec #:  [id number]      Height:       57.0 in Accession #:    [id number]     Weight:       165.3 lb Date of Birth:  1946/10/06      BSA:          1.659 m Patient Age:    78 years       BP:           104/58 mmHg Patient Gender: F              HR:           97 bpm. Exam Location:  Inpatient Procedure: 2D Echo (Both Spectral and Color Flow Doppler were utilized during            procedure). Indications:    S/P TAVR  History:        Patient has prior history of Echocardiogram examinations. CHF.                 Aortic Valve: 23 mm Sapien prosthetic, stented (TAVR)  valve is                 present in the aortic position. Procedure Date: 09/06/24.  Sonographer:    Norleen Amour Referring Phys: [id number] KATHRYN R THOMPSON IMPRESSIONS  1. Left ventricular ejection fraction, by estimation, is 35 to 40%. The left ventricle has moderately decreased function. Left ventricular endocardial border not optimally defined to evaluate regional wall motion. The left ventricular internal cavity size was mildly dilated. Left ventricular diastolic parameters are  consistent with Grade I diastolic dysfunction (impaired relaxation). There is hypokinesis of the left ventricular, mid inferolateral wall. There is akinesis of the left ventricular, entire inferior wall.  2. Right ventricular systolic function is normal. The right ventricular size is moderately enlarged. There is mildly elevated pulmonary artery systolic pressure.  3. Left atrial size was moderately dilated.  4. Right atrial size was mildly dilated.  5. The mitral valve is degenerative. Trivial mitral valve regurgitation. Moderate mitral annular calcification.  6. Tricuspid valve regurgitation is severe.  7. The aortic valve has been repaired/replaced. Aortic valve regurgitation is trivial. There is a 23 mm Sapien prosthetic (TAVR) valve present in the aortic position. Procedure Date: 09/06/24. Aortic valve area, by VTI measures 1.05 cm. Aortic valve mean gradient measures 8.5 mmHg. Aortic valve Vmax measures 1.88 m/s.  8. The inferior vena cava is normal in size with greater than 50% respiratory variability, suggesting right atrial pressure of 3 mmHg. Comparison(s): Prior images reviewed side by side. EF slightly improved compared to immediate pre and post images from 09/06/24; wall motion not well visualized in apical views, contrast attempted but was unsuccessful. TAVR stable with trivial perivalvular leak. FINDINGS  Left Ventricle: Left ventricular ejection fraction, by estimation, is 35 to 40%. The left ventricle has moderately  decreased function. Left ventricular endocardial border not optimally defined to evaluate regional wall motion. The left ventricular internal cavity size was mildly dilated. There is no left ventricular hypertrophy. Left ventricular diastolic parameters are consistent with Grade I diastolic dysfunction (impaired relaxation). Right Ventricle: The right ventricular size is moderately enlarged. Right vetricular wall thickness was not well visualized. Right ventricular systolic function is normal. There is mildly elevated pulmonary artery systolic pressure. The tricuspid regurgitant velocity is 3.14 m/s, and with an assumed right atrial pressure of 3 mmHg, the estimated right ventricular systolic pressure is 42.4 mmHg. Left Atrium: Left atrial size was moderately dilated. Right Atrium: Right atrial size was mildly dilated. Pericardium: There is no evidence of pericardial effusion. Mitral Valve: Mobile chordae seen in subvalvular apparatus. The mitral valve is degenerative in appearance. There is mild thickening of the mitral valve leaflet(s). There is mild calcification of the mitral valve leaflet(s). Moderate mitral annular calcification. Trivial mitral valve regurgitation. MV peak gradient, 3.7 mmHg. The mean mitral valve gradient is 1.0 mmHg. Tricuspid Valve: The tricuspid valve is normal in structure. Tricuspid valve regurgitation is severe. No evidence of tricuspid stenosis. Aortic Valve: The aortic valve has been repaired/replaced. Aortic valve regurgitation is trivial. Aortic valve mean gradient measures 8.5 mmHg. Aortic valve peak gradient measures 14.1 mmHg. Aortic valve area, by VTI measures 1.05 cm. There is a 23 mm Sapien prosthetic, stented (TAVR) valve present in the aortic position. Procedure Date: 09/06/24. Pulmonic Valve: The pulmonic valve was grossly normal. Pulmonic valve regurgitation is mild. No evidence of pulmonic stenosis. Aorta: The aortic root and ascending aorta are structurally normal, with  no evidence of dilitation. Venous: The inferior vena cava is normal in size with greater than 50% respiratory variability, suggesting right atrial pressure of 3 mmHg. IAS/Shunts: The atrial septum is grossly normal.  LEFT VENTRICLE PLAX 2D LVIDd:         5.50 cm      Diastology LVIDs:         4.30 cm      LV e' medial:    5.70 cm/s LV PW:         1.00 cm      LV E/e' medial:  11.8 LV IVS:        1.00 cm      LV e' lateral:   7.97 cm/s LVOT diam:     1.80 cm      LV E/e' lateral: 8.4 LV SV:         37 LV SV Index:   22 LVOT Area:     2.54 cm  LV Volumes (MOD) LV vol d, MOD A2C: 103.0 ml LV vol d, MOD A4C: 120.0 ml LV vol s, MOD A2C: 56.8 ml LV vol s, MOD A4C: 62.8 ml LV SV MOD A2C:     46.2 ml LV SV MOD A4C:     120.0 ml LV SV MOD BP:      52.0 ml RIGHT VENTRICLE             IVC RV Basal diam:  4.70 cm     IVC diam: 1.60 cm RV S prime:     10.20 cm/s TAPSE (M-mode): 1.9 cm LEFT ATRIUM             Index        RIGHT ATRIUM           Index LA diam:        3.90 cm 2.35 cm/m   RA Area:     18.50 cm LA Vol (A2C):   63.2 ml 38.10 ml/m  RA Volume:   47.50 ml  28.63 ml/m LA Vol (A4C):   44.1 ml 26.58 ml/m LA Biplane Vol: 54.1 ml 32.61 ml/m  AORTIC VALVE                     PULMONIC VALVE AV Area (Vmax):    1.06 cm      PV Vmax:       0.81 m/s AV Area (Vmean):   1.03 cm      PV Peak grad:  2.6 mmHg AV Area (VTI):     1.05 cm AV Vmax:           187.75 cm/s AV Vmean:          137.750 cm/s AV VTI:            0.354 m AV Peak Grad:      14.1 mmHg AV Mean Grad:      8.5 mmHg LVOT Vmax:         78.20 cm/s LVOT Vmean:        55.667 cm/s LVOT VTI:          0.146 m LVOT/AV VTI ratio: 0.41  AORTA Ao Root diam: 1.80 cm Ao Asc diam:  2.60 cm MITRAL VALVE                TRICUSPID VALVE MV Area (PHT): 5.27 cm     TR Peak grad:   39.4 mmHg MV Area VTI:   1.98 cm     TR Vmax:        314.00 cm/s MV Peak grad:  3.7 mmHg MV Mean grad:  1.0 mmHg     SHUNTS MV Vmax:       0.97 m/s     Systemic VTI:  0.15 m MV Vmean:      53.2 cm/s     Systemic Diam: 1.80 cm MV Decel Time: 144 msec MV E velocity: 67.10 cm/s MV A velocity: 112.00 cm/s MV E/A ratio:  0.60 Amy Bruckner MD Electronically signed by Amy Bruckner MD Signature Date/Time: 09/07/2024/2:21:59 PM  Final      Assessment and Plan:  Symptomatic sinus pause 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 - zio AT with episodes of 5-10.8 sec pauses yesterday afternoon - pt confirms pauses noted on zio were during waking hours and associated with dizziness/lightheadedness causing her to sit and rest for the afternoon and not do house chores - she denies syncope - no further pauses noted on centralized telemetry so far - no urgent need for temp wire at this time - she is aware she may need PPM, will keep  NPO for now until evaluated by EP - if she is deemed a candidate for PPM, procedure likely tomorrow   Chronic systolic heart failure Hypertension - most recent echo post-TAVR with LVEF 35-40% - GDMT includes: 10 mg jardiance , 25 mg losartan , 10 mg torsemide  - doing well, euvolemic - hold additional IV fluids - hypotensive this morning, hold losartan  for now   Severe LFLG AS S/P TAVR - good valve function on post-TAVR echo   CAD - hx of late presenting STEMI 2023  - LHC with 100% stenosis of proximal RCA, 90% proximal ramus, no intervention - continue ASA, 80 mg lipitor    Hyperlipidemia with LDL goal < 55 - need updated lipid panel - hold statin for now   Elevated LFTs - unclear etiology - statin is not a new medication for her, but will hold for now - borderline BP this morning, will hold losartan     Risk Assessment/Risk Scores:      For questions or updates, please contact [facility] Please consult [email or web address] for contact info under      Signed, Jon Nat Hails, PA  09/11/2024 9:33 AM  I have seen, examined the patient, and reviewed the above assessment and plan.    HPI: Amy [name] is a 78 y.o.  female with a hx of severe aortic stenosis s/p TAVR 08/2024, NICM, CAD, dyslipidemia, prediabetes, obesity, and RBBB who is being seen 09/11/2024 for the evaluation of AV block.  Patient underwent TAVR on 09/06/2024.  She had complete heart block immediately after but recovered intrinsic conduction and was discharged with a live Zio monitor.  After discharge, she was found to have a 10-second pause on her Zio monitor.  This episode coincided with near syncope.  She was instructed to come to the ED for further evaluation.  While in the ED, she reported feeling relatively well at rest.  No new or acute complaints.  Patient was transferred to [facility] from the ED and while on 3E she had another pause, lasting 19 seconds and associated with a syncopal event.  General: Well developed, in no acute distress.  Neck: No JVD.  Cardiac: Normal rate, regular rhythm.  Resp: Normal work of breathing.  Ext: No edema.  Neuro: No gross focal deficits.  Psych: Normal affect.   Echo 09/07/24:  1. Left ventricular ejection fraction, by estimation, is 35 to 40%. The  left ventricle has moderately decreased function. Left ventricular  endocardial border not optimally defined to evaluate regional wall motion.  The left ventricular internal cavity  size was mildly dilated. Left ventricular diastolic parameters are  consistent with Grade I diastolic dysfunction (impaired relaxation). There  is hypokinesis of the left ventricular, mid inferolateral wall. There is  akinesis of the left ventricular,  entire inferior wall.   2. Right ventricular systolic function is normal. The right ventricular  size is moderately enlarged. There is mildly elevated pulmonary artery  systolic pressure.  3. Left atrial size was moderately dilated.   4. Right atrial size was mildly dilated.   5. The mitral valve is degenerative. Trivial mitral valve regurgitation.  Moderate mitral annular calcification.   6. Tricuspid valve regurgitation  is severe.   7. The aortic valve has been repaired/replaced. Aortic valve  regurgitation is trivial. There is a 23 mm Sapien prosthetic (TAVR) valve  present in the aortic position. Procedure Date: 09/06/24. Aortic valve  area, by VTI measures 1.05 cm. Aortic valve  mean gradient measures 8.5 mmHg. Aortic valve Vmax measures 1.88 m/s.   8. The inferior vena cava is normal in size with greater than 50%  respiratory variability, suggesting right atrial pressure of 3 mmHg.   Assessment: Amy [name] is a 78 year old female with history of recent TAVR and pre-existing right bundle branch block who presents with pauses in the setting of phase 4 AV block.  At least 1 episode has been associated with syncope.  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 - Patient meets criteria for permanent pacemaker in the setting of complete heart block  (phase 4).  Plan for permanent pacemaker implantation tomorrow.  Most recent LVEF is 35 to 40%.  Given anticipated progression of her conduction disease and ultimate high pacing burden, recommend CRT-P implantation.  Explained risks, benefits, and alternatives to pacemaker implantation, including but not limited to bleeding, infection, damage to heart or lungs, heart attack, stroke, or death.  Pt verbalized understanding and agrees to proceed if indicated.    Fonda Kitty, MD 09/11/2024 5:19 PM

## 2024-09-11 NOTE — ED Notes (Signed)
 CCMD called and verified patient on cardiac telemetry

## 2024-09-11 NOTE — Progress Notes (Signed)
 Patient received from ER. While admitting to cardiac monitoring patient had about a 5 second episode of unresponsiveness before cardiac monitoring established. Called rapid response and MD, VS per flow sheet. Applied cardiac monitoring and patient repeated event for a longer period of time. Rapid response at bedside and pacer pads applied, EKG obtained. Primary MD responded along with cardiology. Patient transferred to procedure.

## 2024-09-11 NOTE — Progress Notes (Signed)
 Was notified by bedside RN regarding the patient becoming unresponsive prior to transfer from her stretcher.  Presented at bedside.  The patient is now alert and oriented x 4 without complaints.  Cardiology made aware and also presented at bedside.  She had a 19 seconds pause.  Plan for emergent temporary pacemaker placement.  Transferred to 2H under cardiology service.  Appreciate cardiology's assistance.  Time: 15 minutes.

## 2024-09-12 ENCOUNTER — Inpatient Hospital Stay (HOSPITAL_COMMUNITY)
Admission: EM | Disposition: A | Discharge status: Home Health | Payer: Self-pay | Source: Home / Self Care | Attending: Cardiology

## 2024-09-12 ENCOUNTER — Other Ambulatory Visit: Payer: Self-pay

## 2024-09-12 DIAGNOSIS — I5022 Chronic systolic (congestive) heart failure: Secondary | ICD-10-CM

## 2024-09-12 DIAGNOSIS — I442 Atrioventricular block, complete: Secondary | ICD-10-CM | POA: Diagnosis not present

## 2024-09-12 HISTORY — PX: PACEMAKER IMPLANT: EP1218

## 2024-09-12 LAB — BASIC METABOLIC PANEL WITH GFR
Anion gap: 8 (ref 5–15)
BUN: 30 mg/dL — ABNORMAL HIGH (ref 8–23)
CO2: 24 mmol/L (ref 22–32)
Calcium: 8.3 mg/dL — ABNORMAL LOW (ref 8.9–10.3)
Chloride: 104 mmol/L (ref 98–111)
Creatinine, Ser: 1.25 mg/dL — ABNORMAL HIGH (ref 0.44–1.00)
GFR, Estimated: 44 mL/min — ABNORMAL LOW (ref >60)
Glucose, Bld: 137 mg/dL — ABNORMAL HIGH (ref 70–99)
Potassium: 4.7 mmol/L (ref 3.5–5.1)
Sodium: 136 mmol/L (ref 135–145)

## 2024-09-12 LAB — CBC
HCT: 37.9 % (ref 36.0–46.0)
Hemoglobin: 12.7 g/dL (ref 12.0–15.0)
MCH: 32.3 pg (ref 26.0–34.0)
MCHC: 33.5 g/dL (ref 30.0–36.0)
MCV: 96.4 fL (ref 80.0–100.0)
Platelets: 187 K/uL (ref 150–400)
RBC: 3.93 MIL/uL (ref 3.87–5.11)
RDW: 14.1 % (ref 11.5–15.5)
WBC: 7.8 K/uL (ref 4.0–10.5)
nRBC: 0 % (ref 0.0–0.2)

## 2024-09-12 LAB — MAGNESIUM: Magnesium: 2.3 mg/dL (ref 1.7–2.4)

## 2024-09-12 LAB — SURGICAL PCR SCREEN

## 2024-09-12 SURGERY — PACEMAKER IMPLANT

## 2024-09-12 MED ORDER — HEPARIN (PORCINE) IN NACL 1000-0.9 UT/500ML-% IV SOLN
INTRAVENOUS | Status: DC | PRN
  Administered 2024-09-12: 500 mL

## 2024-09-12 MED ORDER — SODIUM CHLORIDE 0.9 % IV SOLN
INTRAVENOUS | Status: AC
  Administered 2024-09-12: 80 mg
  Filled 2024-09-12: qty 2

## 2024-09-12 MED ORDER — MIDAZOLAM HCL 5 MG/5ML IJ SOLN
INTRAMUSCULAR | Status: DC | PRN
  Administered 2024-09-12: .5 mg via INTRAVENOUS

## 2024-09-12 MED ORDER — IOHEXOL 350 MG/ML SOLN
INTRAVENOUS | Status: DC | PRN
  Administered 2024-09-12: 10 mL

## 2024-09-12 MED ORDER — FENTANYL CITRATE (PF) 100 MCG/2ML IJ SOLN
INTRAMUSCULAR | Status: DC | PRN
  Administered 2024-09-12: 12.5 ug via INTRAVENOUS

## 2024-09-12 MED ORDER — LORATADINE 10 MG PO TABS
10.0000 mg | ORAL_TABLET | Freq: Every day | ORAL | Status: DC
  Administered 2024-09-12 – 2024-09-13 (×2): 10 mg via ORAL
  Filled 2024-09-12 (×2): qty 1

## 2024-09-12 MED ORDER — CHLORHEXIDINE GLUCONATE 4 % EX SOLN
CUTANEOUS | Status: AC
  Administered 2024-09-12: 1
  Filled 2024-09-12: qty 15

## 2024-09-12 MED ORDER — MIDAZOLAM HCL 2 MG/2ML IJ SOLN
INTRAMUSCULAR | Status: AC
  Filled 2024-09-12: qty 2

## 2024-09-12 MED ORDER — LIDOCAINE HCL (PF) 1 % IJ SOLN
INTRAMUSCULAR | Status: DC | PRN
  Administered 2024-09-12: 30 mL

## 2024-09-12 MED ORDER — FENTANYL CITRATE (PF) 100 MCG/2ML IJ SOLN
INTRAMUSCULAR | Status: AC
  Filled 2024-09-12: qty 2

## 2024-09-12 MED ORDER — CEFAZOLIN SODIUM-DEXTROSE 2-4 GM/100ML-% IV SOLN
INTRAVENOUS | Status: AC
  Administered 2024-09-12: 2 g via INTRAVENOUS
  Filled 2024-09-12: qty 100

## 2024-09-12 MED ORDER — CHLORHEXIDINE GLUCONATE CLOTH 2 % EX PADS
6.0000 | MEDICATED_PAD | Freq: Every day | CUTANEOUS | Status: DC
  Administered 2024-09-12: 6 via TOPICAL

## 2024-09-12 SURGICAL SUPPLY — 20 items
CABLE SURGICAL S-101-97-12 (CABLE) ×2 IMPLANT
CATH ATTAIN COM SURV 6250V-EH (CATHETERS) IMPLANT
CATH BALLOON 6FR 90 2LUMEN (CATHETERS) IMPLANT
CATH SELECT PACE 669183 (CATHETERS) IMPLANT
CUTTER LV DELIVERY CATHETER 7 (MISCELLANEOUS) IMPLANT
LEAD ACUITY X4 4677 (Lead) IMPLANT
LEAD INGEVITY 7840 45 (Lead) IMPLANT
LEAD INGEVITY 7842 59 (Lead) IMPLANT
MAT PREVALON FULL STRYKER (MISCELLANEOUS) IMPLANT
PACEMAKER VISIONIST X4 U228 (Pacemaker) IMPLANT
PAD DEFIB RADIO PHYSIO CONN (PAD) ×2 IMPLANT
POUCH AIGIS-R ANTIBACT PPM MED (Mesh General) IMPLANT
SHEATH 7FR PRELUDE SNAP 13 (SHEATH) IMPLANT
SHEATH 9.5FR PRELUDE SNAP 13 (SHEATH) IMPLANT
SHEATH 9FR PRELUDE SNAP 13 (SHEATH) IMPLANT
SHEATH PROBE COVER 6X72 (BAG) IMPLANT
SLITTER 6232ADJ (MISCELLANEOUS) IMPLANT
TRAY PACEMAKER INSERTION (PACKS) ×2 IMPLANT
WIRE ACUITY WHISPER EDS 4648 (WIRE) IMPLANT
WIRE HI TORQ VERSACORE-J 145CM (WIRE) IMPLANT

## 2024-09-12 NOTE — Progress Notes (Signed)
  Patient Name: Amy [name] Date of Encounter: 09/12/2024  Primary Cardiologist: Shelda Bruckner, MD Electrophysiologist: None  Interval Summary   Yesterday had recurrent syncope with 19s of ventricular stand-still. Taken urgently for TVP.   NSR in 70-80s this am.   NPO for possible PPM.   No new complaints.   Vital Signs    Vitals:   09/12/24 0400 09/12/24 0500 09/12/24 0600 09/12/24 0735  BP: 132/71 137/69 130/68   Pulse: 76 85 83   Resp: (!) 23 (!) 21 20   Temp:    (!) 97.5 F (36.4 C)  TempSrc:    Oral  SpO2: 97% 96% 99%   Weight:  68.4 kg    Height:        Intake/Output Summary (Last 24 hours) at 09/12/2024 0847 Last data filed at 09/12/2024 0600 Gross per 24 hour  Intake 1043.68 ml  Output 900 ml  Net 143.68 ml   Filed Weights   09/10/24 2102 09/12/24 0500  Weight: 68 kg 68.4 kg    Physical Exam    GEN- NAD, Alert and oriented  Lungs- Clear to ausculation bilaterally, normal work of breathing Cardiac- Regular rate and rhythm, no murmurs, rubs or gallops GI- soft, NT, ND, + BS Extremities- no clubbing or cyanosis. No edema  Telemetry    NSR 70-80 this am (personally reviewed)  Hospital Course    Amy [name] is a 78 y.o. female with a hx of severe aortic stenosis s/p TAVR 09/06/2024, NICM, CAD, dyslipidemia, prediabetes, obesity, and RBBB who is being seen 09/11/2024 for the evaluation of AB block at the request of Dr. Shona.   Given hx of RBBB, TAVR, and intermittent second degree heart block, pt was discharged on 09/08/24 with a live zio to monitor for high grade heart block. BB was held. Cardiology service was notified yesterday of 5-10.8 sec sinus pauses. Pt was called and she reported intermittent dizziness. She was advised to present to the ER for further evaluation. She has not been on AV nodal agents.   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  Given EF ~35-40%, will likely need CRT-P vs conduction system pacing.  Explained risks, benefits, and alternatives to PPM implantation, including but not limited to bleeding, infection, pneumothorax, pericardial effusion, lead dislodgement, heart attack, stroke, or death.  Pt verbalized understanding and agrees to proceed.      Chronic systolic CHF Severe LFLG AS s/p TAVR 09/07/2024 EF 35-40%, grade 1 DD  CAD h/o of late presenting STEMI 2023  HLD Continue statin  Timing pending lab and MD availability. If not today, would likely be tomorrow.    For questions or updates, please contact [facility] Please consult [email or web address] for contact info under     Signed, Ozell Prentice Passey, PA-C  09/12/2024, 8:47 AM

## 2024-09-12 NOTE — TOC Initial Note (Signed)
 Transition of Care [facility]) - Initial/Assessment Note    Patient Details  Name: Amy [name] MRN: [id number] Date of Birth: 03-Nov-1946  [facility] of [facility]) CM/SW Contact:    Justina Delcia Czar, RN Phone Number: [phone] 09/12/2024, 11:44 AM  Clinical Narrative:    Readmission 7/30 day             Spoke to pt and states she lives at home with dtr. States dtr is currently in the hospital. States she had HH with [facility] in the past. Has cane and RW at home. Reports having HH PT in the past and continues to do the exercises daily.   Will need PT/OT evaluation and recommendations.   Chart reviewed for discharge readiness, patient not medically stable for d/c. Inpatient CM/CSW will continue to monitor pt's advancement through interdisciplinary progression rounds.  If new pt transition needs arise, MD please place a TOC consult.   Expected Discharge Plan: Home/Self Care Barriers to Discharge: Continued Medical Work up   Patient Goals and CMS Choice Patient states their goals for this hospitalization and ongoing recovery are:: wants to remain independent CMS Medicare.gov Compare Post Acute Care list provided to:: Patient Choice offered to / list presented to : Patient      Expected Discharge Plan and Services   Discharge Planning Services: CM Consult Post Acute Care Choice: Home Health Living arrangements for the past 2 months: Single Family Home                                      Prior Living Arrangements/Services Living arrangements for the past 2 months: Single Family Home Lives with:: Adult Children Patient language and need for interpreter reviewed:: Yes Do you feel safe going back to the place where you live?: Yes      Need for Family Participation in Patient Care: No (Comment) Care giver support system in place?: Yes (comment) Current home services: DME (rolling walker, cane) Criminal Activity/Legal Involvement Pertinent to Current  Situation/Hospitalization: No - Comment as needed  Activities of Daily Living   ADL Screening (condition at time of admission) Independently performs ADLs?: No Does the patient have a NEW difficulty with bathing/dressing/toileting/self-feeding that is expected to last >3 days?: No Does the patient have a NEW difficulty with getting in/out of bed, walking, or climbing stairs that is expected to last >3 days?: No Does the patient have a NEW difficulty with communication that is expected to last >3 days?: No Is the patient deaf or have difficulty hearing?: No Does the patient have difficulty seeing, even when wearing glasses/contacts?: No Does the patient have difficulty concentrating, remembering, or making decisions?: No  Permission Sought/Granted Permission sought to share information with : Case Manager, Family Supports, PCP Permission granted to share information with : Yes, Verbal Permission Granted  Share Information with NAME: Nadine Carrillo  Permission granted to share info w AGENCY: Home Health, DME, PCP, SNF  Permission granted to share info w Relationship: daughter  Permission granted to share info w Contact Information: [phone]  Emotional Assessment Appearance:: Appears stated age Attitude/Demeanor/Rapport: Engaged Affect (typically observed): Accepting Orientation: : Oriented to Self, Oriented to Place, Oriented to  Time, Oriented to Situation   Psych Involvement: No (comment)  Admission diagnosis:  Dizziness [R42] Sinus pause [I45.5] Symptomatic bradycardia [R00.1] Patient Active Problem List   Diagnosis Date Noted   Symptomatic bradycardia 09/11/2024   S/P TAVR (  transcatheter aortic valve replacement) 09/06/2024   Obesity    RBBB    Cardiogenic shock (HCC) 02/28/2022   AV block, Mobitz 2 02/27/2022   Pressure injury of skin 02/27/2022   Dyslipidemia 02/26/2022   Prediabetes 02/26/2022   Obesity (BMI 30-39.9) 02/26/2022   NICM (nonischemic cardiomyopathy)  (HCC) 12/11/2021   Nonocclusive coronary atherosclerosis of native coronary artery 12/11/2021   History of COVID-19 12/11/2021   Chronic combined systolic and diastolic heart failure (HCC) 12/11/2021   Severe aortic stenosis 12/11/2021   PCP:  Arloa Elsie SAUNDERS, MD Pharmacy:   [facility] - [place], [place] - [address] [place] [address] Phone: [phone] Fax: [phone]  [facility] - [place], [place] - [address] [place] [address] Phone: [phone] Fax: [phone]     Social Drivers of Health (SDOH) Social History: SDOH Screenings   Food Insecurity: No Food Insecurity (09/11/2024)  Housing: Low Risk  (09/11/2024)  Transportation Needs: No Transportation Needs (09/11/2024)  Utilities: Not At Risk (09/11/2024)  Depression (PHQ2-9): Low Risk  (09/26/2022)  Social Connections: Moderately Isolated (09/11/2024)  Tobacco Use: Low Risk  (09/11/2024)   SDOH Interventions:     Readmission Risk Interventions     No data to display

## 2024-09-13 ENCOUNTER — Other Ambulatory Visit: Payer: Self-pay

## 2024-09-13 ENCOUNTER — Encounter (HOSPITAL_COMMUNITY): Payer: Self-pay | Admitting: Cardiology

## 2024-09-13 ENCOUNTER — Inpatient Hospital Stay (HOSPITAL_COMMUNITY)

## 2024-09-13 DIAGNOSIS — R001 Bradycardia, unspecified: Secondary | ICD-10-CM | POA: Diagnosis not present

## 2024-09-13 DIAGNOSIS — Z95810 Presence of automatic (implantable) cardiac defibrillator: Secondary | ICD-10-CM | POA: Diagnosis not present

## 2024-09-13 DIAGNOSIS — I517 Cardiomegaly: Secondary | ICD-10-CM | POA: Diagnosis not present

## 2024-09-13 MED FILL — Midazolam HCl Inj 2 MG/2ML (Base Equivalent): INTRAMUSCULAR | Qty: 0.5 | Status: AC

## 2024-09-13 NOTE — Progress Notes (Signed)
 Discharge   Patient expressed verbal understanding of discharge POC.   Patient given time to ask any questions.  Additional education included in AVS.  Alert oriented in good spirits.   Tele/CCMD/Amanda and PIV removed.  Pressure dressings intact.  Pt states understanding of Pacemaker instructions for home care.   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  Primary nurse aware.  Pt fall risk and will discharge from unit.

## 2024-09-13 NOTE — Discharge Instructions (Addendum)
 After Your Pacemaker   You have a Environmental Education Officer  If you have a Medtronic or Biotronik device, plug in your home monitor once you get home, and no manual interaction is required.   If you have an Abbott or Autozone device, plug your home monitor once you get home, sit near the device, and press the large activation button. Sit nearby until the process is complete, usually notated by lights on the monitor.   If you were set up for monitoring using an app on your phone, make sure the app remains open in the background and the Bluetooth remains on.  ACTIVITY Do not lift your arm above shoulder height for 1 week after your procedure. After 7 days, you may progress as below.  You should remove your sling 24 hours after your procedure, unless otherwise instructed by your provider.     Tuesday September 20, 2024  Wednesday September 21, 2024 Thursday September 22, 2024 Friday September 23, 2024   Do not lift, push, pull, or carry anything over 10 pounds with the affected arm until 6 weeks (Tuesday October 25, 2024 ) after your procedure.   You may drive AFTER your wound check, unless you have been told otherwise by your provider.   Ask your healthcare provider when you can go back to work   INCISION/Dressing If you are on a blood thinner such as Coumadin, Xarelto, Eliquis, Plavix , or Pradaxa please confirm with your provider when this should be resumed.   If large square, outer bandage is left in place, this can be removed after 24 hours from your procedure. Do not remove steri-strips or glue as below.   If a PRESSURE DRESSING (a bulky dressing that usually goes up over your shoulder) was applied or left in place, please follow instructions given by your provider on when to return to have this removed.   Monitor your Pacemaker site for redness, swelling, and drainage. Call the device clinic at [phone] if you experience these symptoms or fever/chills.  If your incision is  sealed with Steri-strips or staples, you may shower 7 days after your procedure or when told by your provider. Do not remove the steri-strips or let the shower hit directly on your site. You may wash around your site with soap and water .    If you were discharged in a sling, please do not wear this during the day more than 48 hours after your surgery unless otherwise instructed. This may increase the risk of stiffness and soreness in your shoulder.   Avoid lotions, ointments, or perfumes over your incision until it is well-healed.  You may use a hot tub or a pool AFTER your wound check appointment if the incision is completely closed.  Pacemaker Alerts:  Some alerts are vibratory and others beep. These are NOT emergencies. Please call our office to let us  know. If this occurs at night or on weekends, it can wait until the next business day. Send a remote transmission.  If your device is capable of reading fluid status (for heart failure), you will be offered monthly monitoring to review this with you.   DEVICE MANAGEMENT Remote monitoring is used to monitor your pacemaker from home. This monitoring is scheduled every 91 days by our office. It allows us  to keep an eye on the functioning of your device to ensure it is working properly. You will routinely see your Electrophysiologist annually (more often if necessary).  This will appear as a REMOTE check on  your MyChart schedule. These are automatic and there is nothing for you to manually do unless otherwise instructed.  You should receive your ID card for your new device in 4-8 weeks. Keep this card with you at all times once received. Consider wearing a medical alert bracelet or necklace.  Your Pacemaker may be MRI compatible. This will be discussed at your next office visit/wound check.  You should avoid contact with strong electric or magnetic fields.   Do not use amateur (ham) radio equipment or electric (arc) welding torches. MP3 player  headphones with magnets should not be used. Some devices are safe to use if held at least 12 inches (30 cm) from your Pacemaker. These include power tools, lawn mowers, and speakers. If you are unsure if something is safe to use, ask your health care provider.  When using your cell phone, hold it to the ear that is on the opposite side from the Pacemaker. Do not leave your cell phone in a pocket over the Pacemaker.  You may safely use electric blankets, heating pads, computers, and microwave ovens.  Call the office right away if: You have chest pain. You feel more short of breath than you have felt before. You feel more light-headed than you have felt before. Your incision starts to open up.  This information is not intended to replace advice given to you by your health care provider. Make sure you discuss any questions you have with your health care provider.

## 2024-09-13 NOTE — Discharge Summary (Addendum)
 ELECTROPHYSIOLOGY PROCEDURE DISCHARGE SUMMARY    Patient ID: Amy [name],  MRN: [id number], DOB/AGE: Apr 03, 1946 78 y.o.  Admit date: 09/10/2024 Discharge date: 09/13/2024  Primary Care Physician: Arloa Elsie SAUNDERS, MD  Primary Cardiologist: Shelda Bruckner, MD  Electrophysiologist: Dr. Kennyth   Primary Discharge Diagnosis:  High grade AV block and symptomatic bradycardia status post pacemaker implantation this admission  Secondary Discharge Diagnosis:  Severe aortic stenosis s/p TAVR HFmrEF, NICM CAD  Allergies  Allergen Reactions   Timolol Shortness Of Breath    Patient does not recall shortness of breath; believes it caused itching/pain in eye.   Codeine Nausea And Vomiting   Morphine And Codeine Nausea And Vomiting   Other Other (See Comments)    vicryl sutures - rash/redness, delay in healing     Procedures This Admission:  1.  Implantation of a Autozone CRT-P on 09/12/24 with Dr. Cindie. There were no immediate post procedure complications.   2.  CXR on 09/13/2024 demonstrated no pneumothorax status post device implantation.       Brief HPI: Amy [name] is a 78 y.o. female was admitted for high grade AV block and symptomatic bradycardia and electrophysiology team asked to see for consideration of PPM implantation.  Past medical history includes severe aortic stenosis s/p TAVR 09/06/2024, NICM, CAD, dyslipidemia, prediabetes, obesity, and RBBB.  The patient has had AV block without reversible causes identified.  Risks, benefits, and alternatives to PPM implantation were reviewed with the patient who wished to proceed.   Hospital Course:  The patient was admitted and underwent implantation of a Autozone CRT-P with details as outlined above.  She was monitored on telemetry overnight which demonstrated appropriate pacing.  Left chest was without hematoma or ecchymosis.  The device was interrogated and found to be functioning  normally.  CXR was obtained and demonstrated no pneumothorax status post device implantation.  Wound care, arm mobility, and restrictions were reviewed with the patient.  The patient was examined and considered stable for discharge to home.    Anticoagulation resumption Continue ASA 81mg  daily.   Physical Exam: Vitals:   09/12/24 1937 09/12/24 2313 09/13/24 0301 09/13/24 0537  BP: 114/62 128/70 (!) 140/71   Pulse: 89 93 96   Resp: 20 20 20    Temp: 99 F (37.2 C) 98.9 F (37.2 C) 98.6 F (37 C)   TempSrc: Oral Oral Oral   SpO2: 95% 91% 93%   Weight:    71.7 kg  Height:        GEN- NAD. A&O x 3.  HEENT: Normocephalic, atraumatic Lungs- CTAB, Normal effort.  Heart- RRR, No M/G/R.  GI- Soft, NT, ND.  Extremities- No clubbing, cyanosis, or edema;  Skin- warm and dry, no rash or lesion, left chest without hematoma/ecchymosis  Discharge Medications:  Allergies as of 09/13/2024       Reactions   Timolol Shortness Of Breath   Patient does not recall shortness of breath; believes it caused itching/pain in eye.   Codeine Nausea And Vomiting   Morphine And Codeine Nausea And Vomiting   Other Other (See Comments)   vicryl sutures - rash/redness, delay in healing        Medication List     TAKE these medications    acetaminophen  500 MG tablet Commonly known as: TYLENOL  Take 500 mg by mouth every 6 (six) hours as needed for mild pain or headache.   aspirin  EC 81 MG tablet Take 81 mg by mouth every  evening.   atorvastatin  80 MG tablet Commonly known as: LIPITOR  Take 1 tablet (80 mg total) by mouth daily.   B-complex with vitamin C tablet Take 1 tablet by mouth daily.   Beet Root 500 MG Caps Take 500 mg by mouth 3 (three) times daily.   BORON PO Take 1 tablet by mouth daily.   CAL-MAG-ZINC PO Take 1 tablet by mouth 3 (three) times daily.   cetirizine 10 MG tablet Commonly known as: ZYRTEC Take 10 mg by mouth daily.   Co Q-10 200 MG Caps Take 200 mg by mouth  daily.   DHEA 25 MG tablet Take 25 mg by mouth daily.   DRY EYE RELIEF OP Place 1 drop into both eyes 4 (four) times daily as needed (dryness).   empagliflozin  10 MG Tabs tablet Commonly known as: Jardiance  Take 1 tablet (10 mg total) by mouth daily. PLEASE SCHEDULE APPOINTMENT FOR MORE REFILLS   FISH OIL PO Take 1 capsule by mouth 2 (two) times daily.   Glucosamine-Chondroitin 750-600 MG Tabs Take 1 tablet by mouth 2 (two) times daily.   GOLD BOND INTENSIVE HEALING EX Apply 1 application  topically daily as needed (eczema).   losartan  25 MG tablet Commonly known as: Cozaar  Take 0.5 tablets (12.5 mg total) by mouth daily.   meclizine  25 MG tablet Commonly known as: ANTIVERT  Take 25 mg by mouth daily.   MSM 1000 MG Caps Take 1,000 mg by mouth daily.   OVER THE COUNTER MEDICATION Take 1 capsule by mouth daily. Sea Kelp   OVER THE COUNTER MEDICATION Place 1 Application into both nostrils daily as needed (congestion). Vicks Nasal Sticks   potassium chloride  10 MEQ tablet Commonly known as: KLOR-CON  Take 1 tablet (10 mEq total) by mouth 3 (three) times a week. What changed:  how much to take when to take this   torsemide  20 MG tablet Commonly known as: DEMADEX  Take 0.5 tablets (10 mg total) by mouth daily.   Vitamin D3 10 MCG (400 UNIT) tablet Take 400 Units by mouth 2 (two) times daily.   vitamin E 180 MG (400 UNITS) capsule Take 400 Units by mouth daily.        Disposition:  Home with usual follow up as in AVS  Signed, Artist Pouch, PA-C  09/13/2024 8:22 AM  I have seen, examined the patient, and reviewed the above assessment and plan.    Hospital Course: Patient presented to the ED from home after having an 11-second pause on Zio monitor which was associated with presyncope.  Review of Zio monitor showed phase for complete heart block.  While sitting on regular nursing floor, she had another episode of face for complete heart block, lasting 19  seconds and associated with syncope.  A temporary pacemaker wire was implanted.  She underwent successful CRT-P implant on 10/27.  No acute overnight events. Patient reports feeling relatively well. No new or acute complaints.   General: Well developed, in no acute distress.  Neck: No JVD.  Cardiac: Normal rate, regular rhythm.  Left chest pacer pocket without hematoma or bleeding. Resp: Normal work of breathing.  Ext: No edema.  Neuro: No gross focal deficits.  Psych: Normal affect.   Assessment:  Amy [name] is a 78 year old female with history of recent TAVR and pre-existing right bundle branch block who presents with pauses in the setting of phase 4 AV block.  At least 1 episode has been associated with syncope.   Problem List:  Complete heart block -  phase 4 block Syncope Symptomatic bradycardia RBBB   Plan:  - Chest x-ray with appropriate lead positions. - Device interrogation performed today.  Appropriate device function and stable lead parameters. - Post implant discharge instructions were provided regarding activity restrictions and wound care. - Follow-up in device clinic in 10 to 14 days.  Duration of discharge encounter 35 minutes.  Fonda Kitty, MD 09/13/2024 10:31 PM

## 2024-09-13 NOTE — TOC Transition Note (Signed)
 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 States he helps her get in her house. She will call taxi for ride home. No HH or DME needed at this time.   PCP's office will call to arrange hospital follow up appt after reviewing patient's hospital records.   Final next level of care: Home/Self Care Barriers to Discharge: No Barriers Identified   Patient Goals and CMS Choice Patient states their goals for this hospitalization and ongoing recovery are:: wants to remain independent CMS Medicare.gov Compare Post Acute Care list provided to:: Patient Choice offered to / list presented to : Patient      Discharge Placement     Discharge Plan and Services Additional resources added to the After Visit Summary for     Discharge Planning Services: CM Consult Post Acute Care Choice: Home Health            Social Drivers of Health (SDOH) Interventions SDOH Screenings   Food Insecurity: No Food Insecurity (09/11/2024)  Housing: Low Risk  (09/11/2024)  Transportation Needs: No Transportation Needs (09/11/2024)  Utilities: Not At Risk (09/11/2024)  Depression (PHQ2-9): Low Risk  (09/26/2022)  Social Connections: Moderately Isolated (09/11/2024)  Tobacco Use: Low Risk  (09/11/2024)     Readmission Risk Interventions     No data to display

## 2024-09-14 ENCOUNTER — Other Ambulatory Visit: Payer: Self-pay | Admitting: Physician Assistant

## 2024-09-14 MED ORDER — AMOXICILLIN 500 MG PO TABS: 2000.0000 mg | ORAL_TABLET | ORAL | 12 refills | Status: AC

## 2024-09-14 NOTE — Progress Notes (Signed)
 Pt missed TOC apt due to being readmitted for a pacer. I could not get a hold of her and unable to leave a VM so I called her sister Grayce and let her know I was calling in Abx in the case she needs to go to the dentist before I see her back in December.

## 2024-09-15 ENCOUNTER — Ambulatory Visit: Admitting: Physician Assistant

## 2024-09-15 ENCOUNTER — Telehealth: Payer: Self-pay

## 2024-09-15 NOTE — Telephone Encounter (Signed)
 Follow-up after same day discharge: Implant date: 09/12/2024 MD: Cindie  Device: PPM Location: L chest    Wound check visit: 09/22/2024 90 day MD follow-up: 12/14/2024  Remote Transmission received:yes  Dressing/sling removed: n/a  Confirm OAC restart on: yes  Please continue to monitor your cardiac device site for redness, swelling, and drainage. Call the device clinic at [phone] if you experience these symptoms, fever/chills, or have questions about your device.   Remote monitoring is used to monitor your cardiac device from home. This monitoring is scheduled every 91 days by our office. It allows us  to keep an eye on the functioning of your device to ensure it is working properly.

## 2024-09-21 ENCOUNTER — Other Ambulatory Visit (HOSPITAL_COMMUNITY): Payer: Self-pay | Admitting: Physician Assistant

## 2024-09-22 ENCOUNTER — Ambulatory Visit: Attending: Cardiovascular Disease | Admitting: *Deleted

## 2024-09-22 DIAGNOSIS — I442 Atrioventricular block, complete: Secondary | ICD-10-CM | POA: Diagnosis not present

## 2024-09-22 NOTE — Progress Notes (Signed)
 Normal multi chamber pacemaker wound check. Presenting rhythm: AS/BiVP. Wound well healed. Routine testing performed. Thresholds, sensing, and impedance consistent with implant measurements and at 3.5V safety margin/auto capture until 3 month visit. PMT x1 noted with V to A conduction noted to be 350 ms. PVARP after PVC set at 400 mS which should be sufficient. If PMT episode frequency increases will re-evaluate. Reviewed arm restrictions to continue for 6 weeks total post op.  Pt enrolled in remote follow-up. Patient's BiV effective pacing percentage low (77%) due to patient having very frequent PVC's (Trigeminy, Bigeminy) see previous remotes. Today, patient's presenting EGM was c/w sinus rhythm with an occasional PVC and patient denied feelings of extreme fatigue or SOB even with activity. Adjusted LV output to 3.5 V @ 0.5 mS

## 2024-09-22 NOTE — Patient Instructions (Signed)

## 2024-09-23 LAB — CUP PACEART INCLINIC DEVICE CHECK
Date Time Interrogation Session: 20251106115236
Implantable Lead Connection Status: 753985
Implantable Lead Connection Status: 753985
Implantable Lead Connection Status: 753985
Implantable Lead Implant Date: 20251027
Implantable Lead Implant Date: 20251027
Implantable Lead Implant Date: 20251027
Implantable Lead Location: 753858
Implantable Lead Location: 753859
Implantable Lead Location: 753860
Implantable Lead Model: 4677
Implantable Lead Model: 7840
Implantable Lead Model: 7842
Implantable Lead Serial Number: 1139196
Implantable Lead Serial Number: 1524612
Implantable Lead Serial Number: 843684
Implantable Pulse Generator Implant Date: 20251027
Lead Channel Impedance Value: 478 Ohm
Lead Channel Impedance Value: 539 Ohm
Lead Channel Impedance Value: 689 Ohm
Lead Channel Pacing Threshold Amplitude: 0.5 V
Lead Channel Pacing Threshold Amplitude: 0.9 V
Lead Channel Pacing Threshold Amplitude: 1.7 V
Lead Channel Pacing Threshold Pulse Width: 0.4 ms
Lead Channel Pacing Threshold Pulse Width: 0.4 ms
Lead Channel Pacing Threshold Pulse Width: 0.4 ms
Lead Channel Sensing Intrinsic Amplitude: 14.5 mV
Lead Channel Sensing Intrinsic Amplitude: 4.7 mV
Lead Channel Sensing Intrinsic Amplitude: 5.5 mV
Lead Channel Setting Pacing Amplitude: 3.5 V
Lead Channel Setting Pacing Amplitude: 3.5 V
Lead Channel Setting Pacing Amplitude: 3.5 V
Lead Channel Setting Pacing Pulse Width: 0.4 ms
Lead Channel Setting Pacing Pulse Width: 0.4 ms
Lead Channel Setting Sensing Sensitivity: 2.5 mV
Lead Channel Setting Sensing Sensitivity: 2.5 mV
Pulse Gen Serial Number: 815751
Zone Setting Status: 755011

## 2024-09-27 DIAGNOSIS — Z950 Presence of cardiac pacemaker: Secondary | ICD-10-CM | POA: Diagnosis not present

## 2024-09-27 DIAGNOSIS — I110 Hypertensive heart disease with heart failure: Secondary | ICD-10-CM | POA: Diagnosis not present

## 2024-09-27 DIAGNOSIS — I4510 Unspecified right bundle-branch block: Secondary | ICD-10-CM | POA: Diagnosis not present

## 2024-09-27 DIAGNOSIS — I4430 Unspecified atrioventricular block: Secondary | ICD-10-CM | POA: Diagnosis not present

## 2024-10-04 NOTE — Addendum Note (Signed)
 Encounter addended by: Malvina Pina A on: 10/04/2024 8:56 AM  Actions taken: Imaging Exam ended

## 2024-10-24 ENCOUNTER — Ambulatory Visit

## 2024-10-24 DIAGNOSIS — I442 Atrioventricular block, complete: Secondary | ICD-10-CM | POA: Diagnosis not present

## 2024-10-25 LAB — CUP PACEART REMOTE DEVICE CHECK
Battery Remaining Longevity: 90 mo
Battery Remaining Percentage: 100 %
Brady Statistic RA Percent Paced: 2 %
Brady Statistic RV Percent Paced: 92 %
Date Time Interrogation Session: 20251208014100
Implantable Lead Connection Status: 753985
Implantable Lead Connection Status: 753985
Implantable Lead Connection Status: 753985
Implantable Lead Implant Date: 20251027
Implantable Lead Implant Date: 20251027
Implantable Lead Implant Date: 20251027
Implantable Lead Location: 753858
Implantable Lead Location: 753859
Implantable Lead Location: 753860
Implantable Lead Model: 4677
Implantable Lead Model: 7840
Implantable Lead Model: 7842
Implantable Lead Serial Number: 1139196
Implantable Lead Serial Number: 1524612
Implantable Lead Serial Number: 843684
Implantable Pulse Generator Implant Date: 20251027
Lead Channel Impedance Value: 480 Ohm
Lead Channel Impedance Value: 513 Ohm
Lead Channel Impedance Value: 713 Ohm
Lead Channel Pacing Threshold Amplitude: 0.7 V
Lead Channel Pacing Threshold Amplitude: 1.4 V
Lead Channel Pacing Threshold Amplitude: 1.7 V
Lead Channel Pacing Threshold Pulse Width: 0.4 ms
Lead Channel Pacing Threshold Pulse Width: 0.4 ms
Lead Channel Pacing Threshold Pulse Width: 0.4 ms
Lead Channel Setting Pacing Amplitude: 3.5 V
Lead Channel Setting Pacing Amplitude: 3.5 V
Lead Channel Setting Pacing Amplitude: 3.5 V
Lead Channel Setting Pacing Pulse Width: 0.4 ms
Lead Channel Setting Pacing Pulse Width: 0.4 ms
Lead Channel Setting Sensing Sensitivity: 2.5 mV
Lead Channel Setting Sensing Sensitivity: 2.5 mV
Pulse Gen Serial Number: 815751
Zone Setting Status: 755011

## 2024-10-26 ENCOUNTER — Ambulatory Visit: Payer: Self-pay | Admitting: Cardiology

## 2024-10-26 NOTE — Progress Notes (Signed)
 Remote PPM Transmission

## 2024-10-31 ENCOUNTER — Ambulatory Visit: Admission: RE | Admit: 2024-10-31 | Discharge: 2024-10-31 | Attending: Cardiology

## 2024-10-31 ENCOUNTER — Other Ambulatory Visit: Payer: Self-pay | Admitting: Physician Assistant

## 2024-10-31 ENCOUNTER — Ambulatory Visit: Payer: Self-pay | Admitting: Physician Assistant

## 2024-10-31 ENCOUNTER — Ambulatory Visit: Admitting: Physician Assistant

## 2024-10-31 ENCOUNTER — Other Ambulatory Visit: Payer: Self-pay

## 2024-10-31 VITALS — BP 116/70 | HR 88 | Ht <= 58 in | Wt 148.0 lb

## 2024-10-31 DIAGNOSIS — I2510 Atherosclerotic heart disease of native coronary artery without angina pectoris: Secondary | ICD-10-CM | POA: Insufficient documentation

## 2024-10-31 DIAGNOSIS — Z952 Presence of prosthetic heart valve: Secondary | ICD-10-CM

## 2024-10-31 DIAGNOSIS — I5020 Unspecified systolic (congestive) heart failure: Secondary | ICD-10-CM

## 2024-10-31 DIAGNOSIS — Z950 Presence of cardiac pacemaker: Secondary | ICD-10-CM

## 2024-10-31 DIAGNOSIS — R911 Solitary pulmonary nodule: Secondary | ICD-10-CM | POA: Insufficient documentation

## 2024-10-31 DIAGNOSIS — I10 Essential (primary) hypertension: Secondary | ICD-10-CM | POA: Insufficient documentation

## 2024-10-31 LAB — ECHOCARDIOGRAM COMPLETE
AR max vel: 0.8 cm2
AV Area VTI: 0.85 cm2
AV Area mean vel: 0.78 cm2
AV Mean grad: 11 mmHg
AV Peak grad: 16.6 mmHg
Ao pk vel: 2.04 m/s
Area-P 1/2: 8.34 cm2
S' Lateral: 4.3 cm

## 2024-10-31 NOTE — Progress Notes (Signed)
 [facility]ENTER   MULTIDISCIPLINARY HEART VALVE CLINIC                                     Cardiology Office Note:    Date:  10/31/2024   ID:  Amy [name], DOB 01/01/1946, MRN [id number]  PCP:  Arloa Elsie SAUNDERS, MD  [facility] Cardiologist:  Toribio Fuel, MD  [facility] Structural heart: [name], MD [facility] Electrophysiologist:  None   Referring MD: Arloa Elsie SAUNDERS, MD   1 month s/p TAVR  History of Present Illness:    Amy [name] is a 78 y.o. 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   She had a late presentation STEMI and April 2023 with a reduced ejection fraction of 30 to 35%. Cath with CAD with interval occlusion of ostial RCA with associate large RV infarct, borderline 60% LM lesion, mild AS and hemodynamics c/w with severe RV failure and severe TR.  She was supported with inotropes and developed a transient second-degree Mobitz type II block which resolved.  She was started on GDMT with improvement in her ejection fraction to 40 to 45%.  She has been followed in the advanced heart failure clinic with NYHA class II symptoms.  Her most recent echo on 07/13/2024 showed EF 40%, and LFLG AS with mean grad 26.5 mmHg, Vmax 3.52 m/s, AVA 0.93 cm2 as well as mild MR and mod-Sev TR. [facility] 07/29/24 showed a chronically occluded mid RCA with mild to moderate disease in the LAD and left circumflex. Right heart pressures elevated and torsemide  changed from PRN to daily. S/p successful TAVR with a 23 mm Edwards Sapien 3 Ultra Resilia THV via the TF approach on 09/06/2024.  Post operative echo showed EF 35%, LV dilation, mod RVE, mild pulm HTN, moderate MAC, severe TR and normally functioning TAVR with a mean gradient of 8.40mm hg and no PVL. She was discharged home with a Zio AT which showed  symptomatic HAVB and she underwent CRT-P (09/12/24).   Today the patient presents to clinic for follow up. Here alone. She is doing better since TAVR. Lives alone. Daughter hospitalized currently. Building strength to do all the house work that she did 30 years ago. No CP but has chronic SOB which is improved since TAVR. No LE edema or PND. Sleeps elevated chronically. No dizziness or syncope., but reports marked dizziness on losartan  12.5mg  daily and not being able to take that. No blood in stool or urine. No palpitations.     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 Red and Dr. Lucas   Severe aortic stenosis      Current Medications: Active Medications[1]    ROS:   Please see the history of present illness.    All other systems reviewed and are negative.  EKGs       Risk Assessment/Calculations:  Physical Exam:    VS:  BP 116/70   Pulse 88   Ht 4' 9 (1.448 m)   Wt 148 lb (67.1 kg)   SpO2 95%   BMI 32.03 kg/m     Wt Readings from Last 3 Encounters:  10/31/24 148 lb (67.1 kg)  09/13/24 158 lb 1.1 oz (71.7 kg)  09/08/24 154 lb 12.8 oz (70.2 kg)     GEN: Well nourished, well developed in no acute distress NECK: No JVD CARDIAC: RRR, no murmurs, rubs, gallops RESPIRATORY:  Clear to auscultation without rales, wheezing or rhonchi  ABDOMEN: Soft, non-tender, non-distended EXTREMITIES:  No edema; No deformity.   ASSESSMENT:    1. S/P TAVR (transcatheter aortic valve replacement)   2. Pacemaker   3. HFrEF (heart failure with reduced ejection fraction) (HCC)   4. Primary hypertension   5. Coronary artery disease involving native coronary  artery of native heart without angina pectoris   6. Pulmonary nodule     PLAN:    In order of problems listed above:  Severe AS s/p TAVR:  -- Echo today shows EF 30-35%, mod RVE, mod TR, normally functioning TAVR with a mean gradient of 11 mm hg and no PVL.  -- NYHA class II symptoms with improvement since TAVR. -- Continue Aspirin  81mg  daily.  -- SBE discussed; she has amoxicillin .  -- I will see back for 1 year office visit with echo.  CHB s/p PPM: -- [employer] CRT-P on 09/12/24 with Dr. Cindie.  -- Continue follow up with EP.  HfrEF/ICM: -- EF 30-35%.  -- S/p CRT-P -- Continue Jardiance  10mg  daily. -- Continue torsemide  10mg  and KCl 10 meq 3x a week.  -- She did not tolerate Losartan  12.5 mg daily due to dizziness. Not interested in retrialing any other GDMT. -- BMET, CBC today. Would like to come off potassium if able.   HTN: -- BP well controlled today. 116/70 -- Continue torsemide  10mg  and KCl 10 meq daily.   CAD: -- S/p STEMI with ostial RCA with associate large RV infarct. -- Treated medically. -- No chest pain. -- Continue aspirin  81mg  daily -- Continue atorvastatin  80mg  daily.   Pulmonary nodule: -- Pre TAVR CTs noted prominent mediastinal nodal tissue which may be reactive and a LUL pulmonary nodule measuring 4 mm. No follow-up needed if patient is low-risk. --  Pt is a never smoker and no personal cancer history so pt is low risk and requires no follow up.    Medication Adjustments/Labs and Tests Ordered: Current medicines are reviewed at length with the patient today.  Concerns regarding medicines are outlined above.  Orders Placed This Encounter  Procedures   Basic Metabolic Panel (BMET)   CBC   No orders of the defined types were placed in this encounter.   Patient Instructions  Medication Instructions:  Your physician has recommended you make the following change in your medication: STOP taking losartan   *If you need a refill on your  cardiac medications before your next appointment, please call your pharmacy*  Lab Work: TODAY BMET and CBC If you have labs (blood work) drawn today and your tests are completely normal, you will receive your results only by: MyChart Message (if you have MyChart) OR A paper copy in the mail If you have any lab test that is abnormal or we need to change your treatment, we will call you to review the results.  Testing/Procedures: 08/17/2025 Your physician has requested that you have an echocardiogram. Echocardiography is  a painless test that uses sound waves to create images of your heart. It provides your doctor with information about the size and shape of your heart and how well your hearts chambers and valves are working. This procedure takes approximately one hour. There are no restrictions for this procedure. Please do NOT wear cologne, perfume, aftershave, or lotions (deodorant is allowed). Please arrive 15 minutes prior to your appointment time.  Please note: We ask at that you not bring children with you during ultrasound (echo/ vascular) testing. Due to room size and safety concerns, children are not allowed in the ultrasound rooms during exams. Our front office staff cannot provide observation of children in our lobby area while testing is being conducted. An adult accompanying a patient to their appointment will only be allowed in the ultrasound room at the discretion of the ultrasound technician under special circumstances. We apologize for any inconvenience.   Follow-Up: At [facility], you and your health needs are our priority.  As part of our continuing mission to provide you with exceptional heart care, our providers are all part of one team.  This team includes your primary Cardiologist (physician) and Advanced Practice Providers or APPs (Physician Assistants and Nurse Practitioners) who all work together to provide you with the care you need, when you need it.  Your  next appointment:   As scheduled on 08/17/2025  Provider:   Izetta Hummer, PA-C  We recommend signing up for the patient portal called MyChart.  Sign up information is provided on this After Visit Summary.  MyChart is used to connect with patients for Virtual Visits (Telemedicine).  Patients are able to view lab/test results, encounter notes, upcoming appointments, etc.  Non-urgent messages can be sent to your provider as well.   To learn more about what you can do with MyChart, go to [email or web address].   Other Instructions: Dr. Nelle team will reach out to schedule an appointment with you.         Signed, Lamarr Hummer, PA-C  10/31/2024 2:44 PM    [facility]     [1]  Current Meds  Medication Sig   acetaminophen  (TYLENOL ) 500 MG tablet Take 500 mg by mouth every 6 (six) hours as needed for mild pain or headache.   amoxicillin  (AMOXIL ) 500 MG tablet Take 4 tablets (2,000 mg total) by mouth as directed. 1 hour prior to dental work including cleanings   aspirin  81 MG EC tablet Take 81 mg by mouth every evening.   atorvastatin  (LIPITOR ) 80 MG tablet Take 1 tablet (80 mg total) by mouth daily.   B Complex-C (B-COMPLEX WITH VITAMIN C) tablet Take 1 tablet by mouth daily.   BORON PO Take 1 tablet by mouth daily.   Calcium -Magnesium -Zinc (CAL-MAG-ZINC PO) Take 1 tablet by mouth 3 (three) times daily.   Carboxymethylcellulose Sodium (DRY EYE RELIEF OP) Place 1 drop into both eyes 4 (four) times daily as needed (dryness).   cetirizine (ZYRTEC) 10 MG tablet Take 10 mg by mouth daily.   Cholecalciferol (VITAMIN D3) 10 MCG (400 UNIT) tablet Take 400 Units by mouth 2 (two) times daily.   Coenzyme Q10 (CO Q-10) 200 MG CAPS Take 200 mg by mouth daily.   DHEA 25 MG tablet Take 25 mg by mouth daily.   empagliflozin  (JARDIANCE ) 10 MG TABS tablet Take 1 tablet (10 mg total) by mouth daily. PLEASE SCHEDULE APPOINTMENT FOR MORE REFILLS [phone] OPTION 2    Glucosamine-Chondroitin 750-600 MG TABS Take  1 tablet by mouth 2 (two) times daily.   meclizine  (ANTIVERT ) 25 MG tablet Take 25 mg by mouth daily.   Methylsulfonylmethane (MSM) 1000 MG CAPS Take 1,000 mg by mouth daily.   Misc Natural Products (BEET ROOT) 500 MG CAPS Take 500 mg by mouth 3 (three) times daily.   Omega-3 Fatty Acids (FISH OIL PO) Take 1 capsule by mouth 2 (two) times daily.   Pramoxine-Dimethicone (GOLD BOND INTENSIVE HEALING EX) Apply 1 application  topically daily as needed (eczema).   torsemide  (DEMADEX ) 20 MG tablet Take 0.5 tablets (10 mg total) by mouth daily.   vitamin E 180 MG (400 UNITS) capsule Take 400 Units by mouth daily.

## 2024-10-31 NOTE — Patient Instructions (Signed)
 Medication Instructions:  Your physician has recommended you make the following change in your medication: STOP taking losartan   *If you need a refill on your cardiac medications before your next appointment, please call your pharmacy*  Lab Work: TODAY BMET and CBC If you have labs (blood work) drawn today and your tests are completely normal, you will receive your results only by: MyChart Message (if you have MyChart) OR A paper copy in the mail If you have any lab test that is abnormal or we need to change your treatment, we will call you to review the results.  Testing/Procedures: 08/17/2025 Your physician has requested that you have an echocardiogram. Echocardiography is a painless test that uses sound waves to create images of your heart. It provides your doctor with information about the size and shape of your heart and how well your hearts chambers and valves are working. This procedure takes approximately one hour. There are no restrictions for this procedure. Please do NOT wear cologne, perfume, aftershave, or lotions (deodorant is allowed). Please arrive 15 minutes prior to your appointment time.  Please note: We ask at that you not bring children with you during ultrasound (echo/ vascular) testing. Due to room size and safety concerns, children are not allowed in the ultrasound rooms during exams. Our front office staff cannot provide observation of children in our lobby area while testing is being conducted. An adult accompanying a patient to their appointment will only be allowed in the ultrasound room at the discretion of the ultrasound technician under special circumstances. We apologize for any inconvenience.   Follow-Up: At [facility], you and your health needs are our priority.  As part of our continuing mission to provide you with exceptional heart care, our providers are all part of one team.  This team includes your primary Cardiologist (physician) and Advanced  Practice Providers or APPs (Physician Assistants and Nurse Practitioners) who all work together to provide you with the care you need, when you need it.  Your next appointment:   As scheduled on 08/17/2025  Provider:   Izetta Hummer, PA-C  We recommend signing up for the patient portal called MyChart.  Sign up information is provided on this After Visit Summary.  MyChart is used to connect with patients for Virtual Visits (Telemedicine).  Patients are able to view lab/test results, encounter notes, upcoming appointments, etc.  Non-urgent messages can be sent to your provider as well.   To learn more about what you can do with MyChart, go to [email or web address].   Other Instructions: Dr. Nelle team will reach out to schedule an appointment with you.

## 2024-11-01 ENCOUNTER — Ambulatory Visit: Payer: Self-pay | Admitting: Physician Assistant

## 2024-11-01 LAB — BASIC METABOLIC PANEL WITH GFR
BUN/Creatinine Ratio: 22 (ref 12–28)
BUN: 18 mg/dL (ref 8–27)
CO2: 25 mmol/L (ref 20–29)
Calcium: 9.5 mg/dL (ref 8.7–10.3)
Chloride: 98 mmol/L (ref 96–106)
Creatinine, Ser: 0.82 mg/dL (ref 0.57–1.00)
Glucose: 90 mg/dL (ref 70–99)
Potassium: 4.1 mmol/L (ref 3.5–5.2)
Sodium: 140 mmol/L (ref 134–144)
eGFR: 74 mL/min/1.73 (ref >59)

## 2024-11-01 LAB — CBC
Hematocrit: 43.6 % (ref 34.0–46.6)
Hemoglobin: 14.8 g/dL (ref 11.1–15.9)
MCH: 33.6 pg — ABNORMAL HIGH (ref 26.6–33.0)
MCHC: 33.9 g/dL (ref 31.5–35.7)
MCV: 99 fL — ABNORMAL HIGH (ref 79–97)
Platelets: 205 x10E3/uL (ref 150–450)
RBC: 4.41 x10E6/uL (ref 3.77–5.28)
RDW: 13.8 % (ref 11.7–15.4)
WBC: 8.4 x10E3/uL (ref 3.4–10.8)

## 2024-12-05 DIAGNOSIS — Z952 Presence of prosthetic heart valve: Secondary | ICD-10-CM

## 2024-12-05 DIAGNOSIS — I442 Atrioventricular block, complete: Secondary | ICD-10-CM

## 2024-12-05 DIAGNOSIS — I4510 Unspecified right bundle-branch block: Secondary | ICD-10-CM

## 2024-12-14 ENCOUNTER — Ambulatory Visit: Admitting: Physician Assistant

## 2025-01-03 ENCOUNTER — Ambulatory Visit: Admitting: Student

## 2025-01-13 ENCOUNTER — Ambulatory Visit: Admitting: Physician Assistant

## 2025-01-23 ENCOUNTER — Ambulatory Visit

## 2025-04-24 ENCOUNTER — Ambulatory Visit

## 2025-08-17 ENCOUNTER — Ambulatory Visit: Admitting: Physician Assistant

## 2025-08-17 ENCOUNTER — Ambulatory Visit (HOSPITAL_COMMUNITY)
